# Patient Record
Sex: Female | Born: 1980 | Hispanic: Yes | Marital: Married | Smoking: Never smoker
Health system: Southern US, Community
[De-identification: ages and names within clinical notes are randomized; demographics above are authoritative.]

## PROBLEM LIST (undated history)

## (undated) ENCOUNTER — Emergency Department (HOSPITAL_COMMUNITY): Admission: EM | Payer: No Typology Code available for payment source | Source: Home / Self Care

## (undated) DIAGNOSIS — R519 Headache, unspecified: Secondary | ICD-10-CM

## (undated) DIAGNOSIS — F419 Anxiety disorder, unspecified: Secondary | ICD-10-CM

## (undated) DIAGNOSIS — E669 Obesity, unspecified: Secondary | ICD-10-CM

## (undated) DIAGNOSIS — I10 Essential (primary) hypertension: Secondary | ICD-10-CM

## (undated) DIAGNOSIS — K219 Gastro-esophageal reflux disease without esophagitis: Secondary | ICD-10-CM

## (undated) DIAGNOSIS — M069 Rheumatoid arthritis, unspecified: Secondary | ICD-10-CM

## (undated) DIAGNOSIS — L509 Urticaria, unspecified: Secondary | ICD-10-CM

## (undated) DIAGNOSIS — G8929 Other chronic pain: Secondary | ICD-10-CM

## (undated) DIAGNOSIS — R51 Headache: Secondary | ICD-10-CM

## (undated) HISTORY — DX: Headache: R51

## (undated) HISTORY — DX: Other chronic pain: G89.29

## (undated) HISTORY — DX: Headache, unspecified: R51.9

## (undated) HISTORY — DX: Anxiety disorder, unspecified: F41.9

## (undated) HISTORY — DX: Rheumatoid arthritis, unspecified: M06.9

## (undated) HISTORY — DX: Urticaria, unspecified: L50.9

## (undated) HISTORY — DX: Gastro-esophageal reflux disease without esophagitis: K21.9

## (undated) HISTORY — PX: NO PAST SURGERIES: SHX2092

---

## 2006-03-07 ENCOUNTER — Inpatient Hospital Stay (HOSPITAL_COMMUNITY): Admission: AD | Admit: 2006-03-07 | Discharge: 2006-03-07 | Payer: Self-pay | Admitting: Gynecology

## 2006-04-01 ENCOUNTER — Ambulatory Visit (HOSPITAL_COMMUNITY): Admission: RE | Admit: 2006-04-01 | Discharge: 2006-04-01 | Payer: Self-pay | Admitting: Gynecology

## 2006-05-20 ENCOUNTER — Inpatient Hospital Stay (HOSPITAL_COMMUNITY): Admission: AD | Admit: 2006-05-20 | Discharge: 2006-05-22 | Payer: Self-pay | Admitting: Obstetrics & Gynecology

## 2006-05-20 ENCOUNTER — Ambulatory Visit: Payer: Self-pay | Admitting: Obstetrics and Gynecology

## 2006-10-10 ENCOUNTER — Emergency Department (HOSPITAL_COMMUNITY): Admission: EM | Admit: 2006-10-10 | Discharge: 2006-10-11 | Payer: Self-pay | Admitting: Emergency Medicine

## 2009-03-22 ENCOUNTER — Emergency Department (HOSPITAL_COMMUNITY): Admission: EM | Admit: 2009-03-22 | Discharge: 2009-03-22 | Payer: Self-pay | Admitting: Emergency Medicine

## 2009-07-15 ENCOUNTER — Emergency Department (HOSPITAL_COMMUNITY): Admission: EM | Admit: 2009-07-15 | Discharge: 2009-07-15 | Payer: Self-pay | Admitting: Emergency Medicine

## 2010-02-13 ENCOUNTER — Emergency Department (HOSPITAL_COMMUNITY): Admission: EM | Admit: 2010-02-13 | Discharge: 2010-02-13 | Payer: Self-pay | Admitting: Emergency Medicine

## 2010-08-20 ENCOUNTER — Emergency Department (HOSPITAL_COMMUNITY): Admission: EM | Admit: 2010-08-20 | Discharge: 2010-06-23 | Payer: Self-pay | Admitting: Emergency Medicine

## 2010-11-30 LAB — URINALYSIS, ROUTINE W REFLEX MICROSCOPIC
Bilirubin Urine: NEGATIVE
Hgb urine dipstick: NEGATIVE
Protein, ur: NEGATIVE mg/dL
Urobilinogen, UA: 0.2 mg/dL (ref 0.0–1.0)

## 2010-11-30 LAB — WET PREP, GENITAL: Trich, Wet Prep: NONE SEEN

## 2010-11-30 LAB — RPR: RPR Ser Ql: NONREACTIVE

## 2010-12-20 LAB — POCT PREGNANCY, URINE: Preg Test, Ur: NEGATIVE

## 2010-12-20 LAB — DIFFERENTIAL
Eosinophils Absolute: 0.1 10*3/uL (ref 0.0–0.7)
Eosinophils Relative: 2 % (ref 0–5)
Lymphs Abs: 2.2 10*3/uL (ref 0.7–4.0)

## 2010-12-20 LAB — URINE MICROSCOPIC-ADD ON

## 2010-12-20 LAB — BASIC METABOLIC PANEL
BUN: 9 mg/dL (ref 6–23)
Chloride: 107 mEq/L (ref 96–112)
Glucose, Bld: 88 mg/dL (ref 70–99)
Potassium: 4.1 mEq/L (ref 3.5–5.1)

## 2010-12-20 LAB — URINALYSIS, ROUTINE W REFLEX MICROSCOPIC
Ketones, ur: NEGATIVE mg/dL
Leukocytes, UA: NEGATIVE
Nitrite: NEGATIVE
Protein, ur: NEGATIVE mg/dL
Urobilinogen, UA: 1 mg/dL (ref 0.0–1.0)

## 2010-12-20 LAB — CBC
HCT: 38.7 % (ref 36.0–46.0)
MCV: 87.9 fL (ref 78.0–100.0)
Platelets: 270 10*3/uL (ref 150–400)
WBC: 8.8 10*3/uL (ref 4.0–10.5)

## 2011-01-20 ENCOUNTER — Emergency Department (HOSPITAL_COMMUNITY): Payer: Self-pay

## 2011-01-20 ENCOUNTER — Emergency Department (HOSPITAL_COMMUNITY)
Admission: EM | Admit: 2011-01-20 | Discharge: 2011-01-20 | Disposition: A | Discharge status: Home / Self Care | Payer: Self-pay | Attending: Emergency Medicine | Admitting: Emergency Medicine

## 2011-01-20 DIAGNOSIS — R29898 Other symptoms and signs involving the musculoskeletal system: Secondary | ICD-10-CM | POA: Insufficient documentation

## 2011-01-20 DIAGNOSIS — M25519 Pain in unspecified shoulder: Secondary | ICD-10-CM | POA: Insufficient documentation

## 2011-04-26 ENCOUNTER — Emergency Department (HOSPITAL_COMMUNITY)
Admission: EM | Admit: 2011-04-26 | Discharge: 2011-04-27 | Disposition: A | Discharge status: Home / Self Care | Payer: Self-pay | Attending: Emergency Medicine | Admitting: Emergency Medicine

## 2011-04-26 DIAGNOSIS — R000 Tachycardia, unspecified: Secondary | ICD-10-CM | POA: Insufficient documentation

## 2011-04-26 DIAGNOSIS — L0291 Cutaneous abscess, unspecified: Secondary | ICD-10-CM | POA: Insufficient documentation

## 2011-04-26 DIAGNOSIS — L0390 Cellulitis, unspecified: Secondary | ICD-10-CM | POA: Insufficient documentation

## 2011-04-26 DIAGNOSIS — R509 Fever, unspecified: Secondary | ICD-10-CM | POA: Insufficient documentation

## 2011-04-26 DIAGNOSIS — M79609 Pain in unspecified limb: Secondary | ICD-10-CM | POA: Insufficient documentation

## 2011-04-27 ENCOUNTER — Emergency Department (HOSPITAL_COMMUNITY): Payer: Self-pay

## 2011-04-27 LAB — BASIC METABOLIC PANEL
CO2: 25 mEq/L (ref 19–32)
Calcium: 9.6 mg/dL (ref 8.4–10.5)
Creatinine, Ser: 0.72 mg/dL (ref 0.50–1.10)
GFR calc Af Amer: >60 mL/min (ref >60)
GFR calc non Af Amer: >60 mL/min (ref >60)
Sodium: 135 mEq/L (ref 135–145)

## 2011-04-27 LAB — DIFFERENTIAL
Basophils Absolute: 0 10*3/uL (ref 0.0–0.1)
Basophils Relative: 0 % (ref 0–1)
Neutro Abs: 6.3 10*3/uL (ref 1.7–7.7)
Neutrophils Relative %: 65 % (ref 43–77)

## 2011-04-27 LAB — CBC
MCH: 28 pg (ref 26.0–34.0)
MCHC: 32.6 g/dL (ref 30.0–36.0)
MCV: 86 fL (ref 78.0–100.0)
Platelets: 338 10*3/uL (ref 150–400)
RDW: 14.7 % (ref 11.5–15.5)

## 2011-04-27 LAB — URINALYSIS, ROUTINE W REFLEX MICROSCOPIC
Bilirubin Urine: NEGATIVE
Specific Gravity, Urine: 1.017 (ref 1.005–1.030)
Urobilinogen, UA: 1 mg/dL (ref 0.0–1.0)
pH: 6 (ref 5.0–8.0)

## 2011-04-27 LAB — URINE MICROSCOPIC-ADD ON

## 2011-04-28 LAB — URINE CULTURE

## 2011-09-02 ENCOUNTER — Encounter: Payer: Self-pay | Admitting: *Deleted

## 2011-09-02 ENCOUNTER — Other Ambulatory Visit: Payer: Self-pay

## 2011-09-02 ENCOUNTER — Emergency Department (HOSPITAL_COMMUNITY): Payer: Self-pay

## 2011-09-02 ENCOUNTER — Emergency Department (HOSPITAL_COMMUNITY)
Admission: EM | Admit: 2011-09-02 | Discharge: 2011-09-02 | Disposition: A | Discharge status: Home / Self Care | Payer: Self-pay | Attending: Emergency Medicine | Admitting: Emergency Medicine

## 2011-09-02 DIAGNOSIS — M79609 Pain in unspecified limb: Secondary | ICD-10-CM | POA: Insufficient documentation

## 2011-09-02 DIAGNOSIS — R55 Syncope and collapse: Secondary | ICD-10-CM | POA: Insufficient documentation

## 2011-09-02 DIAGNOSIS — J329 Chronic sinusitis, unspecified: Secondary | ICD-10-CM | POA: Insufficient documentation

## 2011-09-02 DIAGNOSIS — M722 Plantar fascial fibromatosis: Secondary | ICD-10-CM | POA: Insufficient documentation

## 2011-09-02 LAB — URINALYSIS, ROUTINE W REFLEX MICROSCOPIC
Bilirubin Urine: NEGATIVE
Ketones, ur: NEGATIVE mg/dL
Leukocytes, UA: NEGATIVE
Nitrite: NEGATIVE
Specific Gravity, Urine: 1.022 (ref 1.005–1.030)
Urobilinogen, UA: 0.2 mg/dL (ref 0.0–1.0)

## 2011-09-02 LAB — CBC
HCT: 36.2 % (ref 36.0–46.0)
Hemoglobin: 11.9 g/dL — ABNORMAL LOW (ref 12.0–15.0)
MCH: 27.4 pg (ref 26.0–34.0)
RBC: 4.35 MIL/uL (ref 3.87–5.11)

## 2011-09-02 LAB — COMPREHENSIVE METABOLIC PANEL
ALT: 14 U/L (ref 0–35)
Alkaline Phosphatase: 68 U/L (ref 39–117)
BUN: 11 mg/dL (ref 6–23)
CO2: 24 mEq/L (ref 19–32)
Calcium: 10.1 mg/dL (ref 8.4–10.5)
GFR calc Af Amer: >90 mL/min (ref >90)
GFR calc non Af Amer: >90 mL/min (ref >90)
Glucose, Bld: 85 mg/dL (ref 70–99)
Sodium: 138 mEq/L (ref 135–145)

## 2011-09-02 MED ORDER — HYDROCODONE-ACETAMINOPHEN 5-325 MG PO TABS: 1.0000 | ORAL_TABLET | Freq: Four times a day (QID) | ORAL | Status: AC | PRN

## 2011-09-02 MED ORDER — NAPROXEN 500 MG PO TABS: 500.0000 mg | ORAL_TABLET | Freq: Two times a day (BID) | ORAL | Status: DC

## 2011-09-02 MED ORDER — AZITHROMYCIN 250 MG PO TABS: 250.0000 mg | ORAL_TABLET | Freq: Every day | ORAL | Status: AC

## 2011-09-02 NOTE — ED Notes (Signed)
Pt states she is having pain in her feet. Pt states pain started a month ago. Pt states she is having difficulty ambulating and noticed a change in her feet color

## 2011-09-02 NOTE — ED Provider Notes (Addendum)
History     CSN: [id number]  Arrival date & time 09/02/11  1453   First MD Initiated Contact with Patient 09/02/11 1709      Chief Complaint  Patient presents with  . Foot Pain   Patient is a 30 year old female with several week history of bilateral foot pain is on the soles of feet worse in the morning pain is moderate currently 4/10 no history of injury. Patient also concerned about the color of her toenails.   (Consider location/radiation/quality/duration/timing/severity/associated sxs/prior treatment) The history is provided by the patient.    History reviewed. No pertinent past medical history.  History reviewed. No pertinent past surgical history.  No family history on file.  History  Substance Use Topics  . Smoking status: Never Smoker   . Smokeless tobacco: Not on file  . Alcohol Use: No    OB History    Grav Para Term Preterm Abortions TAB SAB Ect Mult Living                  Review of Systems  Constitutional: Negative for fever and chills.  HENT: Negative for congestion and neck pain.   Eyes: Negative for visual disturbance.  Respiratory: Negative for cough and shortness of breath.   Cardiovascular: Negative for chest pain.  Gastrointestinal: Negative for nausea, vomiting, abdominal pain and diarrhea.  Genitourinary: Negative for dysuria.  Musculoskeletal: Negative for back pain.  Neurological: Negative for headaches.  Hematological: Does not bruise/bleed easily.    Allergies  Review of patient's allergies indicates no known allergies.  Home Medications   Current Outpatient Rx  Name Route Sig Dispense Refill  . IBUPROFEN 200 MG PO TABS Oral Take 200 mg by mouth every 6 (six) hours as needed. pain     . AZITHROMYCIN 250 MG PO TABS Oral Take 1 tablet (250 mg total) by mouth daily. Take first 2 tablets together, then 1 every day until finished. 6 tablet 0  . HYDROCODONE-ACETAMINOPHEN 5-325 MG PO TABS Oral Take 1-2 tablets by mouth every 6 (six)  hours as needed for pain. 12 tablet 0  . NAPROXEN 500 MG PO TABS Oral Take 1 tablet (500 mg total) by mouth 2 (two) times daily. 20 tablet 0    BP 131/80  Pulse 95  Temp(Src) 99.2 F (37.3 C) (Oral)  Resp 20  Ht 5\' 3"  (1.6 m)  Wt 170 lb (77.111 kg)  BMI 30.11 kg/m2  SpO2 100%  Physical Exam  Nursing note and vitals reviewed. Constitutional: She is oriented to person, place, and time. She appears well-developed and well-nourished.  HENT:  Head: Normocephalic and atraumatic.  Mouth/Throat: Oropharynx is clear and moist.  Eyes: Conjunctivae and EOM are normal. Pupils are equal, round, and reactive to light.  Neck: Normal range of motion. Neck supple.  Cardiovascular: Normal rate, regular rhythm and normal heart sounds.   No murmur heard. Pulmonary/Chest: Effort normal and breath sounds normal.  Abdominal: Soft. Bowel sounds are normal. There is no tenderness.  Musculoskeletal: Normal range of motion. She exhibits no edema and no tenderness.       Bilateral feet with no significant tenderness no significant swelling Refill is normal at 2 seconds her last strong dorsalis pedis pulse 2+ in each foot. No significant deformities to the feet. No ankle swelling.   Lymphadenopathy:    She has no cervical adenopathy.  Neurological: She is alert and oriented to person, place, and time. No cranial nerve deficit. She exhibits normal muscle tone. Coordination normal.  Skin: Skin is warm. No rash noted. No erythema.    ED Course  Procedures (including critical care time)  Labs Reviewed  CBC - Abnormal; Notable for the following:    Hemoglobin 11.9 (*)    Platelets 411 (*)    All other components within normal limits  COMPREHENSIVE METABOLIC PANEL - Abnormal; Notable for the following:    Total Protein 8.6 (*)    All other components within normal limits  URINALYSIS, ROUTINE W REFLEX MICROSCOPIC  PREGNANCY, URINE   Dg Chest 2 View  09/02/2011  *RADIOLOGY REPORT*  Clinical Data:  Syncope.  Language barrier.  CHEST - 2 VIEW  Comparison: 04/27/2011  Findings: Low lung volumes.  Increased perihilar markings suggest viral pneumonitis.  No lobar consolidation.  Normal heart size. Bones unremarkable.  Worsening aeration from priors.  IMPRESSION: Increased perihilar markings suggest viral pneumonitis.  No lobar consolidation.  Original Report Authenticated By: Elsie Stain, M.D.   Ct Head Wo Contrast  09/02/2011  *RADIOLOGY REPORT*  Clinical Data: Syncope.  CT HEAD WITHOUT CONTRAST  Technique:  Contiguous axial images were obtained from the base of the skull through the vertex without contrast.  Comparison: None.  Findings: There is no evidence for acute infarction, intracranial hemorrhage, mass lesion, hydrocephalus, or extra-axial fluid. There is no atrophy or white matter disease.  Calvarium is intact. Mastoid air cells are clear. The maxillary and ethmoid sinuses are clear.  There is asymmetric opacity in the right frontal sinus with scattered air-fluid levels implying acute disease. Posterior wall of the frontal sinus is intact.  IMPRESSION: No acute or focal intracranial abnormalities.  Acute right frontal sinusitis.  Original Report Authenticated By: Elsie Stain, M.D.   Results for orders placed during the hospital encounter of 09/02/11  CBC      Component Value Range   WBC 8.0  4.0 - 10.5 (K/uL)   RBC 4.35  3.87 - 5.11 (MIL/uL)   Hemoglobin 11.9 (*) 12.0 - 15.0 (g/dL)   HCT 64.4  03.4 - 74.2 (%)   MCV 83.2  78.0 - 100.0 (fL)   MCH 27.4  26.0 - 34.0 (pg)   MCHC 32.9  30.0 - 36.0 (g/dL)   RDW 59.5  63.8 - 75.6 (%)   Platelets 411 (*) 150 - 400 (K/uL)  COMPREHENSIVE METABOLIC PANEL      Component Value Range   Sodium 138  135 - 145 (mEq/L)   Potassium 3.8  3.5 - 5.1 (mEq/L)   Chloride 102  96 - 112 (mEq/L)   CO2 24  19 - 32 (mEq/L)   Glucose, Bld 85  70 - 99 (mg/dL)   BUN 11  6 - 23 (mg/dL)   Creatinine, Ser 4.33  0.50 - 1.10 (mg/dL)   Calcium 29.5  8.4 - 10.5  (mg/dL)   Total Protein 8.6 (*) 6.0 - 8.3 (g/dL)   Albumin 4.1  3.5 - 5.2 (g/dL)   AST 19  0 - 37 (U/L)   ALT 14  0 - 35 (U/L)   Alkaline Phosphatase 68  39 - 117 (U/L)   Total Bilirubin 0.4  0.3 - 1.2 (mg/dL)   GFR calc non Af Amer >90  >90 (mL/min)   GFR calc Af Amer >90  >90 (mL/min)  URINALYSIS, ROUTINE W REFLEX MICROSCOPIC      Component Value Range   Color, Urine YELLOW  YELLOW    APPearance CLEAR  CLEAR    Specific Gravity, Urine 1.022  1.005 - 1.030  pH 5.5  5.0 - 8.0    Glucose, UA NEGATIVE  NEGATIVE (mg/dL)   Hgb urine dipstick NEGATIVE  NEGATIVE    Bilirubin Urine NEGATIVE  NEGATIVE    Ketones, ur NEGATIVE  NEGATIVE (mg/dL)   Protein, ur NEGATIVE  NEGATIVE (mg/dL)   Urobilinogen, UA 0.2  0.0 - 1.0 (mg/dL)   Nitrite NEGATIVE  NEGATIVE    Leukocytes, UA NEGATIVE  NEGATIVE   PREGNANCY, URINE      Component Value Range   Preg Test, Ur NEGATIVE       1. Plantar fasciitis   2. Sinusitis   3. Near syncope       MDM   Clinically suspect of plantar fasciitis since involving both feet and is on the bottom of the feet on exam nothing strikingly abnormal blood flow and neurovascular exam is normal. No history of injury. Symptoms are present for several days to weeks. Recommend anti-inflammatory medicine referral given to [facility] for further treatment.   Addendum: At discharge patient using the interpreter phone for additional complaint of near syncope and dizziness at work for the past 3 days almost passing out on each day not related to the foot pain. A stone this proceeded with additional workup to evaluate for near syncope without significant findings except for the finding of sinusitis. Chest x-ray suggestive of a pneumonitis or a recent viral infection patient gave no history related to that. We'll treat for sinusitis with Zithromax. Followup with podiatry still recommended as stated above. Patient was given a work excuse so she can rest for the next 3 days.        Shelda Jakes, MD 09/02/11 1840  Shelda Jakes, MD 09/02/11 2159

## 2011-09-02 NOTE — ED Notes (Signed)
After talking with the interpreter the pt. Also has the c/o of passing out. Discharge cancelled and futher workup ordered.

## 2012-03-08 ENCOUNTER — Encounter (HOSPITAL_COMMUNITY): Payer: Self-pay | Admitting: *Deleted

## 2012-03-08 ENCOUNTER — Emergency Department (HOSPITAL_COMMUNITY)
Admission: EM | Admit: 2012-03-08 | Discharge: 2012-03-08 | Disposition: A | Discharge status: Home / Self Care | Payer: Self-pay | Attending: Emergency Medicine | Admitting: Emergency Medicine

## 2012-03-08 ENCOUNTER — Emergency Department (HOSPITAL_COMMUNITY): Payer: Self-pay

## 2012-03-08 DIAGNOSIS — R059 Cough, unspecified: Secondary | ICD-10-CM | POA: Insufficient documentation

## 2012-03-08 DIAGNOSIS — R209 Unspecified disturbances of skin sensation: Secondary | ICD-10-CM | POA: Insufficient documentation

## 2012-03-08 DIAGNOSIS — R05 Cough: Secondary | ICD-10-CM | POA: Insufficient documentation

## 2012-03-08 DIAGNOSIS — R079 Chest pain, unspecified: Secondary | ICD-10-CM | POA: Insufficient documentation

## 2012-03-08 DIAGNOSIS — R531 Weakness: Secondary | ICD-10-CM

## 2012-03-08 DIAGNOSIS — R0602 Shortness of breath: Secondary | ICD-10-CM | POA: Insufficient documentation

## 2012-03-08 DIAGNOSIS — R5381 Other malaise: Secondary | ICD-10-CM | POA: Insufficient documentation

## 2012-03-08 LAB — COMPREHENSIVE METABOLIC PANEL
ALT: 8 U/L (ref 0–35)
AST: 13 U/L (ref 0–37)
Albumin: 4.1 g/dL (ref 3.5–5.2)
Calcium: 9.5 mg/dL (ref 8.4–10.5)
GFR calc Af Amer: >90 mL/min (ref >90)
Glucose, Bld: 100 mg/dL — ABNORMAL HIGH (ref 70–99)
Sodium: 139 mEq/L (ref 135–145)
Total Protein: 8.1 g/dL (ref 6.0–8.3)

## 2012-03-08 LAB — CBC WITH DIFFERENTIAL/PLATELET
Basophils Absolute: 0 10*3/uL (ref 0.0–0.1)
Basophils Relative: 0 % (ref 0–1)
Eosinophils Absolute: 0.1 10*3/uL (ref 0.0–0.7)
Eosinophils Relative: 1 % (ref 0–5)
MCH: 28.2 pg (ref 26.0–34.0)
MCHC: 33.2 g/dL (ref 30.0–36.0)
MCV: 84.9 fL (ref 78.0–100.0)
Platelets: 337 10*3/uL (ref 150–400)
RDW: 14.9 % (ref 11.5–15.5)

## 2012-03-08 LAB — URINALYSIS, ROUTINE W REFLEX MICROSCOPIC
Bilirubin Urine: NEGATIVE
Nitrite: NEGATIVE
Specific Gravity, Urine: 1.015 (ref 1.005–1.030)
pH: 5.5 (ref 5.0–8.0)

## 2012-03-08 LAB — D-DIMER, QUANTITATIVE: D-Dimer, Quant: 1.96 ug/mL-FEU — ABNORMAL HIGH (ref 0.00–0.48)

## 2012-03-08 MED ORDER — IOHEXOL 350 MG/ML SOLN
80.0000 mL | Freq: Once | INTRAVENOUS | Status: AC | PRN
  Administered 2012-03-08: 80 mL via INTRAVENOUS

## 2012-03-08 MED ORDER — SODIUM CHLORIDE 0.9 % IV BOLUS (SEPSIS)
1000.0000 mL | Freq: Once | INTRAVENOUS | Status: AC
  Administered 2012-03-08: 1000 mL via INTRAVENOUS

## 2012-03-08 NOTE — ED Notes (Signed)
Pt working in a kitchen, became overheated and c/o "feeling cold inside", tingling in extremeties. Hyperventilating and anxious in triage. Pt spanish-speaking.

## 2012-03-08 NOTE — ED Notes (Signed)
Old and new EKG given to Dr. Rosalia Hammers.

## 2012-03-08 NOTE — ED Notes (Signed)
NO prescriptions provided at discharge.

## 2012-03-08 NOTE — ED Notes (Signed)
Pharmacy interpreter, pt feels dizzy w/o ringing in ears or spots in eyes, states the room is spinning. Feels like something is coming up in her abdomen but she denies nausea, emesis or being pregnant. No shortness of breath, chest pain, VS WNL.

## 2012-03-08 NOTE — ED Notes (Signed)
[id number]<AV> Expected date:<BR> Expected time:<BR> Means of arrival:<BR> Comments:<BR> ems- got over heated at work

## 2012-03-08 NOTE — Discharge Instructions (Signed)
Pre-sncope (Near-Syncope)  La lipotimia es una sensacin de debilidad o mareos repentinos, o la sensacin de desmayo inminente. Puede ocurrir al levantarse o cuando se permanece de pie durante mucho tiempo. La causa puede ser un descenso de la presin arterial. Es una reaccin frecuente, pero puede ocurrir ms fcilmente a aquellos que toman medicamentos para Scientist, physiological presin arterial. Los mareos normalmente ocurren cuando la presin sangunea o el pulso es muy bajo para proporcionar un flujo sanguneo suficiente al cerebro y Pharmacologist la conciencia. El desmayo y el pre-sncope no suelen deberse a problemas serios. Sin embargo, Runner, broadcasting/film/video deben tener ms cuidado, como los Warner, los pacientes que sufren diabetes y los pacientes con historia de enfermedades cardacas (especialmente los que tienen un ritmo cardaco irregular. CAUSAS  Disminucin de la presin arterial.   Dolores fsicos.   Deshidratacin.   Agotamiento por calor.   Estrs emocional.   Bajo nivel de azcar en la sangre.   Hemorragias internas.   Problemas cardacos y circulatorios.   Infecciones  SNTOMAS  Mareos.   Nuseas.   Lipotimia   Adormecimiento.   Palidez.   Visin en tnel.   Debilitamiento.  INSTRUCCIONES PARA EL CUIDADO DOMICILIARIO  Si siente que va a desmayarse, recustese en seguida. Respire profundamente y de [place]. Espere hasta que los sntomas hayan desaparecido. La mayor parte de los episodios dura unos pocos minutos. Podr sentirse cansado por varias horas.   Beba gran cantidad de lquido para mantener la orina de tono claro o color amarillo plido.   Si est tomando medicamentos para la presin o el corazn, Interior and spatial designer, tomarse varios minutos para sentarse y luego pararse puede reducir los sntomas del mareo debido a una baja de presin sangunea.  SOLICITE ATENCIN MDICA DE INMEDIATO SI:  Dolor de cabeza intenso.   Dolor intenso en el pecho, el  abdomen, o la espalda.   Tiene sangrado por la boca o el ano o la materia fecal.es de color negro o alquitranado.   Ritmo cardaco irregular o pulso rpido.   Mareos repetidos o temblores como sacudidas durante un episodio.   Desmayo mientras se encuentra sentado o acostado.   Confusin.   Dificultad para caminar.   Debilitamiento grave.   Problemas de la visin.  ASEGRESE QUE:  Comprende estas instrucciones.   Controlar su enfermedad.   Solicitar ayuda de inmediato si no mejora o empeora.  Document Released: 08/30/2005 Document Revised: 08/19/2011 [facility] Patient Information 2012 [place], [place].

## 2012-03-08 NOTE — ED Provider Notes (Signed)
History     CSN: [id number]  Arrival date & time 03/08/12  1203   First MD Initiated Contact with Patient 03/08/12 1244      Chief Complaint  Patient presents with  . 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  Patient denies similar symptoms in past, no history of anxiety.  Patient states some nausea with one episode of vomiting at same time.  Some mild chest tingling.  No nausea now.  Chills, small cough, dyspnea, cough nonproductive,no abdominal pain, no swelling or pain in legs, no abnormal vaginal discharge, no uti symptoms.Patient had one soda today but did not eat breakfast or lunch.   LMP  June 7, nml, no bcp.  G2p2, denies sexually activity for 4-5 years.  Patient continues to feel generally weak and nauseated and hot.   History reviewed. No pertinent past medical history.  History reviewed. No pertinent past surgical history.  No family history on file.  History  Substance Use Topics  . Smoking status: Never Smoker   . Smokeless tobacco: Not on file  . Alcohol Use: No    OB History    Grav Para Term Preterm Abortions TAB SAB Ect Mult Living                  Review of Systems  Respiratory: Positive for cough and shortness of breath.   Cardiovascular: Positive for chest pain.  Genitourinary: Negative.   Musculoskeletal: Negative.   Neurological: Negative.   Hematological: Negative.   All other systems reviewed and are negative.    Allergies  Review of patient's allergies indicates no known allergies.  Home Medications   Current Outpatient Rx  Name Route Sig Dispense Refill  . IBUPROFEN 200 MG PO TABS Oral Take 200 mg by mouth every 6 (six) hours as needed. pain      BP 138/72  Pulse 83  Temp 98.4 F (36.9 C)  Resp 20  SpO2 99%  Physical Exam  Nursing note and vitals  reviewed. Constitutional: She appears well-developed and well-nourished.  HENT:  Head: Normocephalic and atraumatic.  Eyes: Conjunctivae and EOM are normal. Pupils are equal, round, and reactive to light.  Neck: Normal range of motion. Neck supple.  Cardiovascular: Normal rate, regular rhythm, normal heart sounds and intact distal pulses.   Pulmonary/Chest: Effort normal and breath sounds normal.  Abdominal: Soft. Bowel sounds are normal.  Musculoskeletal: Normal range of motion.  Neurological: She is alert.  Skin: Skin is warm and dry.  Psychiatric: She has a normal mood and affect. Thought content normal.    ED Course  Procedures (including critical care time)  Labs Reviewed - No data to display No results found.   No diagnosis found.   Date: 03/08/2012  Rate: 72  Rhythm: normal sinus rhythm  QRS Axis: normal  Intervals: normal  ST/T Wave abnormalities: normal  Conduction Disutrbances:rsr v1 and v2  Narrative Interpretation:   Old EKG Reviewed: unchanged  Results for orders placed during the hospital encounter of 03/08/12  CBC WITH DIFFERENTIAL      Component Value Range   WBC 11.0 (*) 4.0 - 10.5 K/uL   RBC 4.44  3.87 - 5.11 MIL/uL   Hemoglobin 12.5  12.0 - 15.0 g/dL   HCT 29.5  28.4 - 13.2 %   MCV 84.9  78.0 - 100.0 fL   MCH 28.2  26.0 -  34.0 pg   MCHC 33.2  30.0 - 36.0 g/dL   RDW 16.1  09.6 - 04.5 %   Platelets 337  150 - 400 K/uL   Neutrophils Relative 84 (*) 43 - 77 %   Neutro Abs 9.2 (*) 1.7 - 7.7 K/uL   Lymphocytes Relative 11 (*) 12 - 46 %   Lymphs Abs 1.2  0.7 - 4.0 K/uL   Monocytes Relative 4  3 - 12 %   Monocytes Absolute 0.4  0.1 - 1.0 K/uL   Eosinophils Relative 1  0 - 5 %   Eosinophils Absolute 0.1  0.0 - 0.7 K/uL   Basophils Relative 0  0 - 1 %   Basophils Absolute 0.0  0.0 - 0.1 K/uL  COMPREHENSIVE METABOLIC PANEL      Component Value Range   Sodium 139  135 - 145 mEq/L   Potassium 3.9  3.5 - 5.1 mEq/L   Chloride 106  96 - 112 mEq/L   CO2  22  19 - 32 mEq/L   Glucose, Bld 100 (*) 70 - 99 mg/dL   BUN 11  6 - 23 mg/dL   Creatinine, Ser 4.09  0.50 - 1.10 mg/dL   Calcium 9.5  8.4 - 81.1 mg/dL   Total Protein 8.1  6.0 - 8.3 g/dL   Albumin 4.1  3.5 - 5.2 g/dL   AST 13  0 - 37 U/L   ALT 8  0 - 35 U/L   Alkaline Phosphatase 68  39 - 117 U/L   Total Bilirubin 0.6  0.3 - 1.2 mg/dL   GFR calc non Af Amer >90  >90 mL/min   GFR calc Af Amer >90  >90 mL/min  URINALYSIS, ROUTINE W REFLEX MICROSCOPIC      Component Value Range   Color, Urine YELLOW  YELLOW   APPearance CLEAR  CLEAR   Specific Gravity, Urine 1.015  1.005 - 1.030   pH 5.5  5.0 - 8.0   Glucose, UA NEGATIVE  NEGATIVE mg/dL   Hgb urine dipstick NEGATIVE  NEGATIVE   Bilirubin Urine NEGATIVE  NEGATIVE   Ketones, ur NEGATIVE  NEGATIVE mg/dL   Protein, ur NEGATIVE  NEGATIVE mg/dL   Urobilinogen, UA 0.2  0.0 - 1.0 mg/dL   Nitrite NEGATIVE  NEGATIVE   Leukocytes, UA NEGATIVE  NEGATIVE  PREGNANCY, URINE      Component Value Range   Preg Test, Ur NEGATIVE  NEGATIVE  D-DIMER, QUANTITATIVE      Component Value Range   D-Dimer, Quant 1.96 (*) 0.00 - 0.48 ug/mL-FEU     MDM    Labs normal except elevated d-dimer.  Ct angio normal.  Patient with improved symptoms possibly due to mild heat exhaustion and/or volume depletion.  Patient advised to improve oral hydration and follow up with pmd.      Hilario Quarry, MD 03/08/12 [phone]

## 2012-11-01 ENCOUNTER — Emergency Department (HOSPITAL_COMMUNITY)
Admission: EM | Admit: 2012-11-01 | Discharge: 2012-11-01 | Disposition: A | Discharge status: Home / Self Care | Payer: Self-pay | Attending: Emergency Medicine | Admitting: Emergency Medicine

## 2012-11-01 ENCOUNTER — Emergency Department (HOSPITAL_COMMUNITY): Payer: Self-pay

## 2012-11-01 ENCOUNTER — Encounter (HOSPITAL_COMMUNITY): Payer: Self-pay | Admitting: Emergency Medicine

## 2012-11-01 DIAGNOSIS — R1013 Epigastric pain: Secondary | ICD-10-CM | POA: Insufficient documentation

## 2012-11-01 DIAGNOSIS — K219 Gastro-esophageal reflux disease without esophagitis: Secondary | ICD-10-CM | POA: Insufficient documentation

## 2012-11-01 LAB — BASIC METABOLIC PANEL
CO2: 24 mEq/L (ref 19–32)
Calcium: 9.3 mg/dL (ref 8.4–10.5)
Creatinine, Ser: 0.68 mg/dL (ref 0.50–1.10)
Glucose, Bld: 84 mg/dL (ref 70–99)
Sodium: 139 mEq/L (ref 135–145)

## 2012-11-01 LAB — CBC
HCT: 38.5 % (ref 36.0–46.0)
Hemoglobin: 12.4 g/dL (ref 12.0–15.0)
MCH: 27.6 pg (ref 26.0–34.0)
MCV: 85.7 fL (ref 78.0–100.0)
RBC: 4.49 MIL/uL (ref 3.87–5.11)

## 2012-11-01 LAB — TROPONIN I: Troponin I: <0.3 ng/mL (ref <0.30)

## 2012-11-01 MED ORDER — SUCRALFATE 1 G PO TABS: 1.0000 g | ORAL_TABLET | Freq: Four times a day (QID) | ORAL | Status: DC

## 2012-11-01 MED ORDER — FAMOTIDINE 20 MG PO TABS
40.0000 mg | ORAL_TABLET | Freq: Once | ORAL | Status: AC
  Administered 2012-11-01: 40 mg via ORAL
  Filled 2012-11-01: qty 2

## 2012-11-01 MED ORDER — SODIUM CHLORIDE 0.9 % IV SOLN: INTRAVENOUS | Status: DC

## 2012-11-01 MED ORDER — PANTOPRAZOLE SODIUM 20 MG PO TBEC: 20.0000 mg | DELAYED_RELEASE_TABLET | Freq: Every day | ORAL | Status: DC

## 2012-11-01 MED ORDER — GI COCKTAIL ~~LOC~~
30.0000 mL | Freq: Once | ORAL | Status: AC
  Administered 2012-11-01: 30 mL via ORAL
  Filled 2012-11-01: qty 30

## 2012-11-01 NOTE — ED Notes (Signed)
US at bedside

## 2012-11-01 NOTE — ED Provider Notes (Signed)
History     CSN: [id number]  Arrival date & time 11/01/12  1518   First MD Initiated Contact with Patient 11/01/12 1606      Chief Complaint  Patient presents with  . Chest Pain    (Consider location/radiation/quality/duration/timing/severity/associated sxs/prior treatment) Patient is a 32 y.o. female presenting with chest pain. The history is provided by the patient.  Chest Pain  patient here complaining of epigastric pain that is worse with eating. She has vomiting approximately 15 minutes after eating. Symptoms have been ongoing and she was placed on antibiotics medications without relief. Denies any anginal type chest pain. Pain is burning and does radiate to her back. No urinary sx or vaginal bleeding  History reviewed. No pertinent past medical history.  History reviewed. No pertinent past surgical history.  No family history on file.  History  Substance Use Topics  . Smoking status: Never Smoker   . Smokeless tobacco: Not on file  . Alcohol Use: No    OB History   Grav Para Term Preterm Abortions TAB SAB Ect Mult Living                  Review of Systems  Cardiovascular: Positive for chest pain.  All other systems reviewed and are negative.    Allergies  Review of patient's allergies indicates no known allergies.  Home Medications  No current outpatient prescriptions on file.  BP 127/71  Pulse 82  Temp(Src) 98.2 F (36.8 C) (Oral)  Resp 18  SpO2 100%  LMP 10/25/2012  Physical Exam  Nursing note and vitals reviewed. Constitutional: She is oriented to person, place, and time. She appears well-developed and well-nourished.  Non-toxic appearance. No distress.  HENT:  Head: Normocephalic and atraumatic.  Eyes: Conjunctivae, EOM and lids are normal. Pupils are equal, round, and reactive to light.  Neck: Normal range of motion. Neck supple. No tracheal deviation present. No mass present.  Cardiovascular: Normal rate, regular rhythm and normal heart  sounds.  Exam reveals no gallop.   No murmur heard. Pulmonary/Chest: Effort normal and breath sounds normal. No stridor. No respiratory distress. She has no decreased breath sounds. She has no wheezes. She has no rhonchi. She has no rales.  Abdominal: Soft. Normal appearance and bowel sounds are normal. She exhibits no distension. There is tenderness in the epigastric area. There is no rebound and no CVA tenderness.  Musculoskeletal: Normal range of motion. She exhibits no edema and no tenderness.  Neurological: She is alert and oriented to person, place, and time. She has normal strength. No cranial nerve deficit or sensory deficit. GCS eye subscore is 4. GCS verbal subscore is 5. GCS motor subscore is 6.  Skin: Skin is warm and dry. No abrasion and no rash noted.  Psychiatric: She has a normal mood and affect. Her speech is normal and behavior is normal.    ED Course  Procedures (including critical care time)  Labs Reviewed  CBC  BASIC METABOLIC PANEL  PRO B NATRIURETIC PEPTIDE  TROPONIN I  LIPASE, BLOOD   No results found.   No diagnosis found.    MDM   Patient with symptoms consistent with GERD. No evidence of gallstones on ultrasound. She has no signs of pancreatitis at this time. She had been on some type of antacid in the past. I will place her on a PPI and give her Carafate and referral to GI on call       Toy Baker, MD 11/01/12 1752

## 2012-11-01 NOTE — ED Notes (Signed)
MD at bedside. 

## 2012-11-01 NOTE — ED Notes (Signed)
Pt presenting to ed with c/o chest pain with positive shortness of breath, nausea and vomiting. Pt states she is also having tingling in her left arm onset yesterday

## 2013-03-08 ENCOUNTER — Encounter (HOSPITAL_COMMUNITY): Payer: Self-pay | Admitting: *Deleted

## 2013-03-08 ENCOUNTER — Emergency Department (HOSPITAL_COMMUNITY): Payer: Worker's Compensation

## 2013-03-08 ENCOUNTER — Emergency Department (HOSPITAL_COMMUNITY)
Admission: EM | Admit: 2013-03-08 | Discharge: 2013-03-08 | Disposition: A | Discharge status: Home / Self Care | Payer: Worker's Compensation | Attending: Emergency Medicine | Admitting: Emergency Medicine

## 2013-03-08 DIAGNOSIS — M79609 Pain in unspecified limb: Secondary | ICD-10-CM | POA: Insufficient documentation

## 2013-03-08 DIAGNOSIS — R1110 Vomiting, unspecified: Secondary | ICD-10-CM | POA: Insufficient documentation

## 2013-03-08 DIAGNOSIS — R0789 Other chest pain: Secondary | ICD-10-CM

## 2013-03-08 DIAGNOSIS — Z3202 Encounter for pregnancy test, result negative: Secondary | ICD-10-CM | POA: Insufficient documentation

## 2013-03-08 DIAGNOSIS — R0602 Shortness of breath: Secondary | ICD-10-CM | POA: Insufficient documentation

## 2013-03-08 DIAGNOSIS — R071 Chest pain on breathing: Secondary | ICD-10-CM | POA: Insufficient documentation

## 2013-03-08 LAB — LIPASE, BLOOD: Lipase: 16 U/L (ref 11–59)

## 2013-03-08 LAB — URINALYSIS, ROUTINE W REFLEX MICROSCOPIC
Bilirubin Urine: NEGATIVE
Nitrite: NEGATIVE
Specific Gravity, Urine: 1.019 (ref 1.005–1.030)
pH: 5 (ref 5.0–8.0)

## 2013-03-08 LAB — CBC
Hemoglobin: 12.6 g/dL (ref 12.0–15.0)
MCH: 28.4 pg (ref 26.0–34.0)
RBC: 4.44 MIL/uL (ref 3.87–5.11)

## 2013-03-08 LAB — URINE MICROSCOPIC-ADD ON

## 2013-03-08 NOTE — ED Notes (Signed)
Pt has multiple complaints. Reports mid chest pain today, feels difficult to take a deep breath, having chills, bodyaches and vomiting. Denies diarrhea. ekg being done at triage.

## 2013-03-08 NOTE — ED Provider Notes (Signed)
History    CSN: [id number] Arrival date & time 03/08/13  1334  First MD Initiated Contact with Patient 03/08/13 1351     Chief Complaint  Patient presents with  . Chest Pain  . Shortness of Breath  . Emesis   (Consider location/radiation/quality/duration/timing/severity/associated sxs/prior Treatment) HPI  Patient presents today complaining of left sided chest pain that is sharp in nature. It began when she was at work today. She has some radiation through to her back. She has some shortness of breath associated with this. She has had this multiple times in the past and has been assessed and states that she was told it was "nothing". She has not had any cough. She has not re\re of DVT or pulmonary embolism. She denies any fever, headache, nausea, vomiting, or diarrhea. She has been eating normally. She was at work today she has to stand and began having these symptoms approximately an hour prior to admission. The chest pain in the chest is sharp it is not currently having any radiation, it has been present for approximately one hour. It is moderate. She has some associated shortness of breath. She has not had any treatment for this. History reviewed. No pertinent past medical history. History reviewed. No pertinent past surgical history. History reviewed. No pertinent family history. History  Substance Use Topics  . Smoking status: Never Smoker   . Smokeless tobacco: Not on file  . Alcohol Use: No   OB History   Grav Para Term Preterm Abortions TAB SAB Ect Mult Living                 Review of Systems  All other systems reviewed and are negative.    Allergies  Review of patient's allergies indicates no known allergies.  Home Medications   Current Outpatient Rx  Name  Route  Sig  Dispense  Refill  . Famotidine (ACID CONTROLLER PO)   Oral   Take 1 tablet by mouth daily as needed (acid reflux).          BP 121/72  Pulse 70  Temp(Src) 99.1 F (37.3 C) (Oral)  Resp  16  SpO2 100%  LMP 02/11/2013 Physical Exam  Nursing note and vitals reviewed. Constitutional: She is oriented to person, place, and time. She appears well-developed and well-nourished.  HENT:  Head: Normocephalic and atraumatic.  Right Ear: External ear normal.  Left Ear: External ear normal.  Nose: Nose normal.  Mouth/Throat: Oropharynx is clear and moist.  Eyes: Conjunctivae and EOM are normal. Pupils are equal, round, and reactive to light.  Neck: Normal range of motion. Neck supple.  Cardiovascular: Normal rate, regular rhythm, normal heart sounds and intact distal pulses.   Pulmonary/Chest: Effort normal and breath sounds normal. She exhibits tenderness.  Abdominal: Soft. Bowel sounds are normal.  Musculoskeletal: Normal range of motion.  Mild bilateral calf ttp.  Neurological: She is alert and oriented to person, place, and time. She has normal reflexes.  Skin: Skin is warm and dry.  Psychiatric: She has a normal mood and affect. Her behavior is normal. Thought content normal.    ED Course  Procedures (including critical care time) Labs Reviewed  URINALYSIS, ROUTINE W REFLEX MICROSCOPIC - Abnormal; Notable for the following:    Leukocytes, UA TRACE (*)    All other components within normal limits  URINE MICROSCOPIC-ADD ON - Abnormal; Notable for the following:    Squamous Epithelial / LPF MANY (*)    Bacteria, UA FEW (*)  All other components within normal limits  CBC  LIPASE, BLOOD  PREGNANCY, URINE  POCT I-STAT TROPONIN I   Dg Chest 2 View  03/08/2013   *RADIOLOGY REPORT*  Clinical Data: Chest pain. Shortness of breath.  CHEST - 2 VIEW  Comparison: 03/08/2012.  Findings: Minimal peribronchial thickening stable and chronic and may represent reactive changes.  No infiltrate, congestive heart failure or pneumothorax.  Confluence of shadows anterior aspect right second rib.  Heart size within normal limits.  IMPRESSION: No acute abnormality.   Original Report  Authenticated By: Lacy Duverney, M.D.   No diagnosis found.    Date: 03/08/2013  Rate: 87  Rhythm: normal sinus rhythm  QRS Axis: normal  Intervals: normal  ST/T Wave abnormalities: normal  Conduction Disutrbances: none  Narrative Interpretation: unremarkable   Results for orders placed during the hospital encounter of 03/08/13  CBC      Result Value Range   WBC 9.5  4.0 - 10.5 K/uL   RBC 4.44  3.87 - 5.11 MIL/uL   Hemoglobin 12.6  12.0 - 15.0 g/dL   HCT 16.1  09.6 - 04.5 %   MCV 85.1  78.0 - 100.0 fL   MCH 28.4  26.0 - 34.0 pg   MCHC 33.3  30.0 - 36.0 g/dL   RDW 40.9  81.1 - 91.4 %   Platelets 321  150 - 400 K/uL  LIPASE, BLOOD      Result Value Range   Lipase 16  11 - 59 U/L  URINALYSIS, ROUTINE W REFLEX MICROSCOPIC      Result Value Range   Color, Urine YELLOW  YELLOW   APPearance CLEAR  CLEAR   Specific Gravity, Urine 1.019  1.005 - 1.030   pH 5.0  5.0 - 8.0   Glucose, UA NEGATIVE  NEGATIVE mg/dL   Hgb urine dipstick NEGATIVE  NEGATIVE   Bilirubin Urine NEGATIVE  NEGATIVE   Ketones, ur NEGATIVE  NEGATIVE mg/dL   Protein, ur NEGATIVE  NEGATIVE mg/dL   Urobilinogen, UA 0.2  0.0 - 1.0 mg/dL   Nitrite NEGATIVE  NEGATIVE   Leukocytes, UA TRACE (*) NEGATIVE  URINE MICROSCOPIC-ADD ON      Result Value Range   Squamous Epithelial / LPF MANY (*) RARE   WBC, UA 0-2  <3 WBC/hpf   Bacteria, UA FEW (*) RARE  PREGNANCY, URINE      Result Value Range   Preg Test, Ur NEGATIVE  NEGATIVE  POCT I-STAT TROPONIN I      Result Value Range   Troponin i, poc 0.02  0.00 - 0.08 ng/mL   Comment 3             Patient with normal work up here, with normal vital signs- specifically no tachypnea, tachycardia, normotensive.  She has a negative d-dimer although some bilateral lower extremity ttp.  It is unclear the etiology of her pain but likely musculoskeletal given chest ttp.   Hilario Quarry, MD 03/08/13 [phone]

## 2013-08-06 ENCOUNTER — Emergency Department (HOSPITAL_COMMUNITY): Payer: Self-pay

## 2013-08-06 ENCOUNTER — Encounter (HOSPITAL_COMMUNITY): Payer: Self-pay | Admitting: Emergency Medicine

## 2013-08-06 ENCOUNTER — Emergency Department (HOSPITAL_COMMUNITY)
Admission: EM | Admit: 2013-08-06 | Discharge: 2013-08-06 | Disposition: A | Discharge status: Home / Self Care | Payer: Self-pay | Attending: Emergency Medicine | Admitting: Emergency Medicine

## 2013-08-06 DIAGNOSIS — R1110 Vomiting, unspecified: Secondary | ICD-10-CM | POA: Insufficient documentation

## 2013-08-06 DIAGNOSIS — K219 Gastro-esophageal reflux disease without esophagitis: Secondary | ICD-10-CM | POA: Insufficient documentation

## 2013-08-06 DIAGNOSIS — F410 Panic disorder [episodic paroxysmal anxiety] without agoraphobia: Secondary | ICD-10-CM | POA: Insufficient documentation

## 2013-08-06 DIAGNOSIS — I10 Essential (primary) hypertension: Secondary | ICD-10-CM | POA: Insufficient documentation

## 2013-08-06 DIAGNOSIS — E669 Obesity, unspecified: Secondary | ICD-10-CM | POA: Insufficient documentation

## 2013-08-06 HISTORY — DX: Essential (primary) hypertension: I10

## 2013-08-06 LAB — COMPREHENSIVE METABOLIC PANEL
ALT: 9 U/L (ref 0–35)
BUN: 11 mg/dL (ref 6–23)
CO2: 23 mEq/L (ref 19–32)
Calcium: 9.7 mg/dL (ref 8.4–10.5)
Creatinine, Ser: 0.7 mg/dL (ref 0.50–1.10)
GFR calc Af Amer: >90 mL/min (ref >90)
GFR calc non Af Amer: >90 mL/min (ref >90)
Glucose, Bld: 92 mg/dL (ref 70–99)
Sodium: 138 mEq/L (ref 135–145)

## 2013-08-06 LAB — CBC WITH DIFFERENTIAL/PLATELET
Eosinophils Absolute: 0 10*3/uL (ref 0.0–0.7)
Eosinophils Relative: 0 % (ref 0–5)
HCT: 38.3 % (ref 36.0–46.0)
Hemoglobin: 12.5 g/dL (ref 12.0–15.0)
Lymphocytes Relative: 20 % (ref 12–46)
Lymphs Abs: 1.8 10*3/uL (ref 0.7–4.0)
MCH: 28.4 pg (ref 26.0–34.0)
MCV: 87 fL (ref 78.0–100.0)
Monocytes Absolute: 0.5 10*3/uL (ref 0.1–1.0)
Monocytes Relative: 6 % (ref 3–12)
RBC: 4.4 MIL/uL (ref 3.87–5.11)
WBC: 9 10*3/uL (ref 4.0–10.5)

## 2013-08-06 LAB — URINALYSIS, ROUTINE W REFLEX MICROSCOPIC
Bilirubin Urine: NEGATIVE
Hgb urine dipstick: NEGATIVE
Nitrite: NEGATIVE
Protein, ur: NEGATIVE mg/dL
Urobilinogen, UA: 0.2 mg/dL (ref 0.0–1.0)

## 2013-08-06 LAB — POCT PREGNANCY, URINE: Preg Test, Ur: NEGATIVE

## 2013-08-06 LAB — LIPASE, BLOOD: Lipase: 12 U/L (ref 11–59)

## 2013-08-06 MED ORDER — ONDANSETRON HCL 4 MG PO TABS: 4.0000 mg | ORAL_TABLET | Freq: Four times a day (QID) | ORAL | Status: DC

## 2013-08-06 MED ORDER — ONDANSETRON 4 MG PO TBDP
4.0000 mg | ORAL_TABLET | Freq: Once | ORAL | Status: AC
  Administered 2013-08-06: 4 mg via ORAL
  Filled 2013-08-06: qty 1

## 2013-08-06 MED ORDER — LISINOPRIL 5 MG PO TABS: 5.0000 mg | ORAL_TABLET | Freq: Every day | ORAL | Status: DC

## 2013-08-06 MED ORDER — OMEPRAZOLE 20 MG PO CPDR: 40.0000 mg | DELAYED_RELEASE_CAPSULE | Freq: Every day | ORAL | Status: DC

## 2013-08-06 MED ORDER — SUCRALFATE 1 G PO TABS: 1.0000 g | ORAL_TABLET | Freq: Three times a day (TID) | ORAL | Status: DC

## 2013-08-06 MED ORDER — ONDANSETRON HCL 4 MG/2ML IJ SOLN
4.0000 mg | Freq: Once | INTRAMUSCULAR | Status: AC
  Administered 2013-08-06: 4 mg via INTRAVENOUS
  Filled 2013-08-06: qty 2

## 2013-08-06 MED ORDER — GI COCKTAIL ~~LOC~~
30.0000 mL | Freq: Once | ORAL | Status: AC
  Administered 2013-08-06: 30 mL via ORAL
  Filled 2013-08-06: qty 30

## 2013-08-06 MED ORDER — MORPHINE SULFATE 4 MG/ML IJ SOLN
4.0000 mg | Freq: Once | INTRAMUSCULAR | Status: AC
  Administered 2013-08-06: 4 mg via INTRAVENOUS
  Filled 2013-08-06: qty 1

## 2013-08-06 MED ORDER — SODIUM CHLORIDE 0.9 % IV BOLUS (SEPSIS)
1000.0000 mL | Freq: Once | INTRAVENOUS | Status: AC
  Administered 2013-08-06: 1000 mL via INTRAVENOUS

## 2013-08-06 MED ORDER — PANTOPRAZOLE SODIUM 40 MG IV SOLR
40.0000 mg | Freq: Once | INTRAVENOUS | Status: AC
  Administered 2013-08-06: 40 mg via INTRAVENOUS
  Filled 2013-08-06: qty 40

## 2013-08-06 NOTE — ED Provider Notes (Signed)
TIME SEEN: 2:57 PM  CHIEF COMPLAINT: Vomiting, epigastric pain, chest pain  HPI: Patient is a 32 year old female with a history of hypertension, obesity who presents emergency department with complaints of vomiting after eating every day for the past 2 weeks. She states today she had an episode of vomiting during this episode had chest pain, shortness of breath and felt very anxious. She had numbness and tingling in both of her hands during this episode. She has had similar symptoms before. No prior history of cardiac disease, pulmonary embolus or DVT. No lower extremity swelling or pain, recent prolonged immobilization, surgery or trauma, fracture. She is not taking exogenous estrogen. She is not a smoker. States she has tried taking Zantac and omeprazole for her epigastric burning pain that this does not resolve her symptoms. States her symptoms are worse after eating. She has had chills over the past 2 days but no fever. No diarrhea. No bloody stools or melena. No alcohol use or heavy NSAID use.  ROS: See HPI Constitutional: no fever  Eyes: no drainage  ENT: no runny nose   Cardiovascular:  no chest pain  Resp: no SOB  GI: no vomiting GU: no dysuria Integumentary: no rash  Allergy: no hives  Musculoskeletal: no leg swelling  Neurological: no slurred speech ROS otherwise negative  PAST MEDICAL HISTORY/PAST SURGICAL HISTORY:  Past Medical History  Diagnosis Date  . Hypertension     MEDICATIONS:  Prior to Admission medications   Not on File    ALLERGIES:  No Known Allergies  SOCIAL HISTORY:  History  Substance Use Topics  . Smoking status: Never Smoker   . Smokeless tobacco: Not on file  . Alcohol Use: No    FAMILY HISTORY: No family history on file.  EXAM: BP 143/83  Pulse 99  Temp(Src) 98.5 F (36.9 C) (Oral)  Resp 20  SpO2 100%  LMP 07/30/2013 CONSTITUTIONAL: Alert and oriented and responds appropriately to questions. Well-appearing; well-nourished HEAD:  Normocephalic EYES: Conjunctivae clear, PERRL ENT: normal nose; no rhinorrhea; moist mucous membranes; pharynx without lesions noted NECK: Supple, no meningismus, no LAD  CARD: RRR; S1 and S2 appreciated; no murmurs, no clicks, no rubs, no gallops RESP: Normal chest excursion without splinting or tachypnea; breath sounds clear and equal bilaterally; no wheezes, no rhonchi, no rales,  ABD/GI: Normal bowel sounds; non-distended; soft, obese, tender to palpation in the epigastric region and right upper quadrant, positive Murphy's sign, no peritoneal signs BACK:  The back appears normal and is non-tender to palpation, there is no CVA tenderness EXT: Normal ROM in all joints; non-tender to palpation; no edema; normal capillary refill; no cyanosis    SKIN: Normal color for age and race; warm NEURO: Moves all extremities equally PSYCH: The patient's mood and manner are appropriate. Grooming and personal hygiene are appropriate.  MEDICAL DECISION MAKING: Patient here with likely panic attack after vomiting.  Vomiting and abdominal pain may be due to cholecystitis versus cholelithiasis versus gastritis versus GERD.  We'll check abdominal labs, right upper quadrant ultrasound.  She has no risk factors for ACS other than HTN and she is PERC negative.  I do not feel her CP, SOB today were cardiac or pulmonary in nature but rather due to anxiety.  ED PROGRESS: Ultrasound shows no abnormality. Urine shows no sign of infection.  Urine pregnancy test negative. Labs pending. Patient reports her pain is improved but has not completely resolved. We'll give GI cocktail.  Pt's labs are unremarkable. She reports improvement in symptoms  after GI cocktail. She still having mild nausea but has been able to tolerate by mouth in ED. Labs are unremarkable. We'll discharge home with prescription for Carafate and Zofran. We'll have her continue her omeprazole 40 mg once daily over-the-counter. Have given return precautions.  Patient is also requesting a refill her lisinopril 5 mg once daily. Patient verbalizes understanding is comfortable plan.   EKG Interpretation    Date/Time:  Monday August 06 2013 15:44:03 EST Ventricular Rate:  100 PR Interval:  146 QRS Duration: 96 QT Interval:  332 QTC Calculation: 428 R Axis:   27 Text Interpretation:  Sinus tachycardia RSR' in V1 or V2, probably normal variant Baseline wander in lead(s) V5 Unchanged compared to March 08 2013 Confirmed by Aliscia Clayton  DO, Martavious Hartel [phone]) on 08/06/2013 3:58:23 PM             Layla Maw Savier Trickett, DO 08/06/13 6213

## 2013-08-06 NOTE — ED Notes (Addendum)
Per EMS pt starting hyperventilating at work; sat down; felt tingling in feet bilaterally. Pt has sensation in feet bilaterally. Per manager at work, pt has hypertension and unable to afford blood pressure medication. Pt denies stressful event causing event. Pt reports n/v starting today.

## 2013-08-06 NOTE — ED Notes (Signed)
Bed: WA07 Expected date:  Expected time:  Means of arrival:  Comments: Chart locked: [facility]

## 2013-08-06 NOTE — ED Notes (Signed)
Use of interpreter for d/c instructions. Pt denies questions.

## 2013-08-06 NOTE — ED Notes (Signed)
Pt use female urinal to void.

## 2013-08-06 NOTE — Progress Notes (Signed)
[facility] provided pt with a list of primary care resources and a [facility] [employer] application.  °

## 2013-08-06 NOTE — ED Notes (Addendum)
Pt given spite per request of EDP, Ward.

## 2013-08-06 NOTE — ED Notes (Signed)
Unable to draw labs from IV. Phlebotomy notified.

## 2013-11-02 ENCOUNTER — Encounter (HOSPITAL_COMMUNITY): Payer: Self-pay | Admitting: Emergency Medicine

## 2013-11-02 ENCOUNTER — Emergency Department (HOSPITAL_COMMUNITY): Payer: Self-pay

## 2013-11-02 ENCOUNTER — Emergency Department (HOSPITAL_COMMUNITY)
Admission: EM | Admit: 2013-11-02 | Discharge: 2013-11-03 | Disposition: A | Discharge status: Home / Self Care | Payer: Self-pay | Attending: Emergency Medicine | Admitting: Emergency Medicine

## 2013-11-02 DIAGNOSIS — R1013 Epigastric pain: Secondary | ICD-10-CM | POA: Insufficient documentation

## 2013-11-02 DIAGNOSIS — R142 Eructation: Secondary | ICD-10-CM | POA: Insufficient documentation

## 2013-11-02 DIAGNOSIS — R1110 Vomiting, unspecified: Secondary | ICD-10-CM | POA: Insufficient documentation

## 2013-11-02 DIAGNOSIS — Z3202 Encounter for pregnancy test, result negative: Secondary | ICD-10-CM | POA: Insufficient documentation

## 2013-11-02 DIAGNOSIS — R141 Gas pain: Secondary | ICD-10-CM | POA: Insufficient documentation

## 2013-11-02 DIAGNOSIS — I10 Essential (primary) hypertension: Secondary | ICD-10-CM | POA: Insufficient documentation

## 2013-11-02 DIAGNOSIS — R143 Flatulence: Secondary | ICD-10-CM

## 2013-11-02 DIAGNOSIS — Z792 Long term (current) use of antibiotics: Secondary | ICD-10-CM | POA: Insufficient documentation

## 2013-11-02 DIAGNOSIS — Z79899 Other long term (current) drug therapy: Secondary | ICD-10-CM | POA: Insufficient documentation

## 2013-11-02 LAB — URINALYSIS, ROUTINE W REFLEX MICROSCOPIC
GLUCOSE, UA: NEGATIVE mg/dL
Hgb urine dipstick: NEGATIVE
KETONES UR: NEGATIVE mg/dL
NITRITE: NEGATIVE
PROTEIN: 30 mg/dL — AB
Specific Gravity, Urine: 1.035 — ABNORMAL HIGH (ref 1.005–1.030)
UROBILINOGEN UA: 0.2 mg/dL (ref 0.0–1.0)
pH: 7.5 (ref 5.0–8.0)

## 2013-11-02 LAB — CBC WITH DIFFERENTIAL/PLATELET
BASOS ABS: 0 10*3/uL (ref 0.0–0.1)
BASOS PCT: 1 % (ref 0–1)
Eosinophils Absolute: 0.4 10*3/uL (ref 0.0–0.7)
Eosinophils Relative: 5 % (ref 0–5)
HEMATOCRIT: 39.7 % (ref 36.0–46.0)
Hemoglobin: 13.3 g/dL (ref 12.0–15.0)
LYMPHS PCT: 32 % (ref 12–46)
Lymphs Abs: 2.4 10*3/uL (ref 0.7–4.0)
MCH: 29.2 pg (ref 26.0–34.0)
MCHC: 33.5 g/dL (ref 30.0–36.0)
MCV: 87.1 fL (ref 78.0–100.0)
Monocytes Absolute: 0.7 10*3/uL (ref 0.1–1.0)
Monocytes Relative: 9 % (ref 3–12)
NEUTROS ABS: 3.9 10*3/uL (ref 1.7–7.7)
NEUTROS PCT: 53 % (ref 43–77)
PLATELETS: 321 10*3/uL (ref 150–400)
RBC: 4.56 MIL/uL (ref 3.87–5.11)
RDW: 14.4 % (ref 11.5–15.5)
WBC: 7.3 10*3/uL (ref 4.0–10.5)

## 2013-11-02 LAB — LIPASE, BLOOD: LIPASE: 12 U/L (ref 11–59)

## 2013-11-02 LAB — COMPREHENSIVE METABOLIC PANEL
ALBUMIN: 3.9 g/dL (ref 3.5–5.2)
ALT: 17 U/L (ref 0–35)
AST: 21 U/L (ref 0–37)
Alkaline Phosphatase: 54 U/L (ref 39–117)
BILIRUBIN TOTAL: 0.3 mg/dL (ref 0.3–1.2)
BUN: 11 mg/dL (ref 6–23)
CHLORIDE: 101 meq/L (ref 96–112)
CO2: 23 meq/L (ref 19–32)
CREATININE: 0.78 mg/dL (ref 0.50–1.10)
Calcium: 9.7 mg/dL (ref 8.4–10.5)
GFR calc Af Amer: >90 mL/min (ref >90)
Glucose, Bld: 92 mg/dL (ref 70–99)
POTASSIUM: 4.1 meq/L (ref 3.7–5.3)
SODIUM: 139 meq/L (ref 137–147)
Total Protein: 7.9 g/dL (ref 6.0–8.3)

## 2013-11-02 LAB — PREGNANCY, URINE: PREG TEST UR: NEGATIVE

## 2013-11-02 LAB — URINE MICROSCOPIC-ADD ON

## 2013-11-02 NOTE — ED Notes (Addendum)
Pt reports having upper abdominal pain for the past two weeks and began having n/v today x 8 episodes and diarrhea. Pt states that she has been evaluated for pain like this previously and her PCP told her she had fibroids. Pt reports menstruating twice this month and was placed on Amoxicillin and Metronidazole on 2/11. Pt is a&o x4, NAD noted at this time.

## 2013-11-02 NOTE — ED Notes (Signed)
Patient requested to undress and change into hospital gown, warm blanket given.

## 2013-11-02 NOTE — ED Notes (Signed)
Patient transported to X-ray 

## 2013-11-03 MED ORDER — PANTOPRAZOLE SODIUM 40 MG IV SOLR
40.0000 mg | Freq: Once | INTRAVENOUS | Status: AC
  Administered 2013-11-03: 40 mg via INTRAVENOUS
  Filled 2013-11-03: qty 40

## 2013-11-03 MED ORDER — GI COCKTAIL ~~LOC~~
30.0000 mL | Freq: Once | ORAL | Status: AC
  Administered 2013-11-03: 30 mL via ORAL
  Filled 2013-11-03: qty 30

## 2013-11-03 MED ORDER — ONDANSETRON HCL 4 MG/2ML IJ SOLN
4.0000 mg | Freq: Once | INTRAMUSCULAR | Status: AC
  Administered 2013-11-03: 4 mg via INTRAVENOUS
  Filled 2013-11-03: qty 2

## 2013-11-03 MED ORDER — ONDANSETRON HCL 4 MG PO TABS
4.0000 mg | ORAL_TABLET | Freq: Four times a day (QID) | ORAL | Status: DC
Start: 2013-11-03 — End: 2014-02-22

## 2013-11-03 MED ORDER — FAMOTIDINE 20 MG PO TABS: 20.0000 mg | ORAL_TABLET | Freq: Two times a day (BID) | ORAL | Status: DC

## 2013-11-03 MED ORDER — SODIUM CHLORIDE 0.9 % IV BOLUS (SEPSIS)
1000.0000 mL | Freq: Once | INTRAVENOUS | Status: AC
  Administered 2013-11-03: 1000 mL via INTRAVENOUS

## 2013-11-03 NOTE — ED Notes (Signed)
Patient given sprite for po challenge.

## 2013-11-03 NOTE — ED Notes (Signed)
Patient c/o upper abd pain x2 weeks and headache.

## 2013-11-03 NOTE — ED Notes (Signed)
Patent discharge instructions reviewed with patient by dr and nurse using pacific interpreters service.

## 2013-11-03 NOTE — Discharge Instructions (Signed)
Dolor abdominal en adultos  (Abdominal Pain, Adult)  El dolor puede tener muchas causas. Normalmente la causa del dolor abdominal no es una enfermedad y mejorará sin tratamiento. Frecuentemente puede controlarse y tratarse en casa. Su médico le realizará un examen físico y posiblemente solicite análisis de sangre y radiografías para ayudar a determinar la gravedad de su dolor. Sin embargo, en muchos casos, debe transcurrir más tiempo antes de que se pueda encontrar una causa evidente del dolor. Antes de llegar a ese punto, es posible que su médico no sepa si necesita más pruebas o un tratamiento más profundo.  INSTRUCCIONES PARA EL CUIDADO EN EL HOGAR   Esté atento al dolor para ver si hay cambios. Las siguientes indicaciones ayudarán a aliviar cualquier molestia que pueda sentir:  · Tome solo medicamentos de venta libre o recetados, según las indicaciones del médico.  · No tome laxantes a menos que se lo haya indicado su médico.  · Pruebe con una dieta líquida absoluta (caldo, té o agua) según se lo indique su médico. Introduzca gradualmente una dieta normal, según su tolerancia.  SOLICITE ATENCIÓN MÉDICA SI:  · Tiene dolor abdominal sin explicación.  · Tiene dolor abdominal relacionado con náuseas o diarrea.  · Tiene dolor cuando orina o defeca.  · Experimenta dolor abdominal que lo despierta de noche.  · Tiene dolor abdominal que empeora o mejora cuando come alimentos.  · Tiene dolor abdominal que empeora cuando come alimentos grasosos.  SOLICITE ATENCIÓN MÉDICA DE INMEDIATO SI:   · El dolor no desaparece en un plazo máximo de 2 horas.  · Tiene fiebre.  · No deja de (vomitar).  · El dolor se siente solo en partes del abdomen, como el lado derecho o la parte inferior izquierda del abdomen.  · Evacúa materia fecal sanguinolenta o negra, de aspecto alquitranado.  ASEGÚRESE DE QUE:  · Comprende estas instrucciones.  · Controlará su afección.  · Recibirá ayuda de inmediato si no mejora o si empeora.  Document  Released: 08/30/2005 Document Revised: 06/20/2013  Exit[employer]® Patient Information ©2014 [employer].

## 2013-11-04 NOTE — ED Provider Notes (Signed)
CSN: [id number]     Arrival date & time 11/02/13  1926 History   First MD Initiated Contact with Patient 11/02/13 2324     Chief Complaint  Patient presents with  . Abdominal Pain  . Emesis     (Consider location/radiation/quality/duration/timing/severity/associated sxs/prior Treatment) HPI History provided by patient. Has had persistent ongoing epigastric pain for the last 2 weeks. She was evaluated by primary care physician at [facility]. she is unable to tell me what her diagnosis was posterior and antibiotics Flagyl and amoxicillin and given samples of Nexium.  She has ran out of Nexium but continues to take antibiotics. She presents today with recurrent epigastric pain, nausea but no vomiting. A sharp in quality and not radiating. She has some belching and some burning pain. No diarrhea. No abdominal pain otherwise. No back pain. Symptoms moderate in severity. No blood in stools. No black or tarry stools. Past Medical History  Diagnosis Date  . Hypertension    History reviewed. No pertinent past surgical history. History reviewed. No pertinent family history. History  Substance Use Topics  . Smoking status: Never Smoker   . Smokeless tobacco: Never Used  . Alcohol Use: No   OB History   Grav Para Term Preterm Abortions TAB SAB Ect Mult Living                 Review of Systems  Constitutional: Negative for fever and chills.  Eyes: Negative for visual disturbance.  Respiratory: Negative for shortness of breath.   Cardiovascular: Negative for chest pain.  Gastrointestinal: Positive for abdominal pain. Negative for vomiting, diarrhea and blood in stool.  Genitourinary: Negative for dysuria and flank pain.  Musculoskeletal: Negative for back pain, neck pain and neck stiffness.  Skin: Negative for rash.  Neurological: Negative for syncope, weakness and numbness.  All other systems reviewed and are negative.      Allergies  Review of patient's allergies indicates no  known allergies.  Home Medications   Current Outpatient Rx  Name  Route  Sig  Dispense  Refill  . amoxicillin (AMOXIL) 500 MG tablet   Oral   Take 500 mg by mouth 3 (three) times daily.         Marland Kitchen lisinopril (PRINIVIL,ZESTRIL) 5 MG tablet   Oral   Take 1 tablet (5 mg total) by mouth daily.   30 tablet   1   . metroNIDAZOLE (FLAGYL) 500 MG tablet   Oral   Take 500 mg by mouth.         . famotidine (PEPCID) 20 MG tablet   Oral   Take 1 tablet (20 mg total) by mouth 2 (two) times daily.   30 tablet   0   . ondansetron (ZOFRAN) 4 MG tablet   Oral   Take 1 tablet (4 mg total) by mouth every 6 (six) hours.   12 tablet   0    BP 106/75  Pulse 70  Temp(Src) 99 F (37.2 C) (Oral)  Resp 18  SpO2 96%  LMP 10/16/2013 Physical Exam  Constitutional: She is oriented to person, place, and time. She appears well-developed and well-nourished.  HENT:  Head: Normocephalic and atraumatic.  Eyes: EOM are normal. Pupils are equal, round, and reactive to light.  Neck: Neck supple.  Cardiovascular: Normal rate, regular rhythm and intact distal pulses.   Pulmonary/Chest: Effort normal and breath sounds normal. No respiratory distress.  Abdominal: Soft. Bowel sounds are normal. She exhibits no distension.  Obese. Tender  epigastric. No abdominal tenderness otherwise. Negative Murphy sign. No CVA tenderness  Musculoskeletal: Normal range of motion. She exhibits no edema.  Neurological: She is alert and oriented to person, place, and time.  Skin: Skin is warm and dry.    ED Course  Procedures (including critical care time) Labs Review Labs Reviewed  URINALYSIS, ROUTINE W REFLEX MICROSCOPIC - Abnormal; Notable for the following:    APPearance CLOUDY (*)    Specific Gravity, Urine 1.035 (*)    Bilirubin Urine SMALL (*)    Protein, ur 30 (*)    Leukocytes, UA MODERATE (*)    All other components within normal limits  URINE MICROSCOPIC-ADD ON - Abnormal; Notable for the following:     Squamous Epithelial / LPF FEW (*)    Casts HYALINE CASTS (*)    All other components within normal limits  PREGNANCY, URINE  CBC WITH DIFFERENTIAL  COMPREHENSIVE METABOLIC PANEL  LIPASE, BLOOD   Imaging Review Dg Abd Acute W/chest  11/02/2013   CLINICAL DATA:  Abdominal pain, nausea and vomiting.  EXAM: ACUTE ABDOMEN SERIES (ABDOMEN 2 VIEW & CHEST 1 VIEW)  COMPARISON:  US ABDOMEN COMPLETE dated 08/06/2013; DG CHEST 2 VIEW dated 03/08/2013; CT ANGIO CHEST W/CM &/OR WO/CM dated 03/08/2012  FINDINGS: The lungs are well-aerated and clear. There is no evidence of focal opacification, pleural effusion or pneumothorax. The cardiomediastinal silhouette is within normal limits.  The visualized bowel gas pattern is unremarkable. Scattered stool and air are seen within the colon; there is no evidence of small bowel dilatation to suggest obstruction. No free intra-abdominal air is identified on the provided upright view.  No acute osseous abnormalities are seen; the sacroiliac joints are unremarkable in appearance.  IMPRESSION: 1. Unremarkable bowel gas pattern; no free intra-abdominal air seen. 2. No acute cardiopulmonary process identified.   Electronically Signed   By: Roanna Raider M.D.   On: 11/02/2013 23:56    EKG Interpretation   None      IV fluids. IV Zofran. IV Protonix. GI cocktail.  On recheck pain improved.  Interpreter phone used and I suspect patient is being treated for H. Pylori. She is no longer taking any acid reducing medications.   Plan discharge home with prescription for Pepcid and Zofran as needed, continue antibiotics and followup with primary care physician on [address] GI referral provided. Patient agrees to strict return precautions. MDM   Final diagnoses:  Epigastric pain   Symptoms improved with GI cocktail and medications. IV fluids provided. Evaluated with labs and imaging reviewed as above. Vital signs and nurses notes reviewed and considered   Sunnie Nielsen,  MD 11/05/13 [phone]

## 2013-12-26 ENCOUNTER — Encounter: Payer: Self-pay | Admitting: Internal Medicine

## 2013-12-26 ENCOUNTER — Ambulatory Visit: Payer: No Typology Code available for payment source | Attending: Internal Medicine | Admitting: Internal Medicine

## 2013-12-26 VITALS — BP 106/70 | HR 66 | Temp 98.9°F | Resp 16 | Ht 63.0 in | Wt 218.0 lb

## 2013-12-26 DIAGNOSIS — Z79899 Other long term (current) drug therapy: Secondary | ICD-10-CM | POA: Insufficient documentation

## 2013-12-26 DIAGNOSIS — R109 Unspecified abdominal pain: Secondary | ICD-10-CM | POA: Insufficient documentation

## 2013-12-26 DIAGNOSIS — M25549 Pain in joints of unspecified hand: Secondary | ICD-10-CM

## 2013-12-26 DIAGNOSIS — M25449 Effusion, unspecified hand: Secondary | ICD-10-CM

## 2013-12-26 DIAGNOSIS — R6884 Jaw pain: Secondary | ICD-10-CM | POA: Insufficient documentation

## 2013-12-26 DIAGNOSIS — K219 Gastro-esophageal reflux disease without esophagitis: Secondary | ICD-10-CM

## 2013-12-26 DIAGNOSIS — E669 Obesity, unspecified: Secondary | ICD-10-CM

## 2013-12-26 DIAGNOSIS — Z7189 Other specified counseling: Secondary | ICD-10-CM

## 2013-12-26 DIAGNOSIS — M26609 Unspecified temporomandibular joint disorder, unspecified side: Secondary | ICD-10-CM

## 2013-12-26 DIAGNOSIS — IMO0001 Reserved for inherently not codable concepts without codable children: Secondary | ICD-10-CM

## 2013-12-26 DIAGNOSIS — I10 Essential (primary) hypertension: Secondary | ICD-10-CM | POA: Insufficient documentation

## 2013-12-26 DIAGNOSIS — Z7689 Persons encountering health services in other specified circumstances: Secondary | ICD-10-CM

## 2013-12-26 LAB — CBC WITH DIFFERENTIAL/PLATELET
Basophils Absolute: 0 10*3/uL (ref 0.0–0.1)
Basophils Relative: 0 % (ref 0–1)
Eosinophils Absolute: 0.2 10*3/uL (ref 0.0–0.7)
Eosinophils Relative: 3 % (ref 0–5)
HCT: 37.8 % (ref 36.0–46.0)
HEMOGLOBIN: 12.7 g/dL (ref 12.0–15.0)
LYMPHS ABS: 2.1 10*3/uL (ref 0.7–4.0)
Lymphocytes Relative: 29 % (ref 12–46)
MCH: 28.2 pg (ref 26.0–34.0)
MCHC: 33.6 g/dL (ref 30.0–36.0)
MCV: 84 fL (ref 78.0–100.0)
MONOS PCT: 7 % (ref 3–12)
Monocytes Absolute: 0.5 10*3/uL (ref 0.1–1.0)
NEUTROS ABS: 4.5 10*3/uL (ref 1.7–7.7)
NEUTROS PCT: 61 % (ref 43–77)
Platelets: 367 10*3/uL (ref 150–400)
RBC: 4.5 MIL/uL (ref 3.87–5.11)
RDW: 14.7 % (ref 11.5–15.5)
WBC: 7.4 10*3/uL (ref 4.0–10.5)

## 2013-12-26 MED ORDER — TRAMADOL HCL 50 MG PO TABS: 50.0000 mg | ORAL_TABLET | Freq: Three times a day (TID) | ORAL | Status: DC | PRN

## 2013-12-26 MED ORDER — PANTOPRAZOLE SODIUM 40 MG PO TBEC: 40.0000 mg | DELAYED_RELEASE_TABLET | Freq: Two times a day (BID) | ORAL | Status: DC

## 2013-12-26 NOTE — Progress Notes (Signed)
Patient ID: Karina Murphy, female   DOB: 1980/11/06, 33 y.o.   MRN: [id number]    Karina Murphy, is a 33 y.o. female  XLK:[id number]  [id number]:[id number]  DOB - [birth year]  CC: abdominal pain, joint swelling, jaw pain      HPI: Karina Murphy is a 33 y.o. female here today to establish medical care.  Patient presents for a number of concerns that have all been present for the past two months.  Patient states that she has been going to the ED for ongoing abdominal pain.  She reports that she was treated for H. Pylori last month and has continued to have ongoing abdominal pain.  She states that she has had continued burning and pressure in her epigastric area that is worst after meals and at night.  She states that the pressure often causes her to cough and vomit.  Patient also presents with pain/tingling in bilateral hands.  Patient states that she has noticed swelling in the joints of her fingers that are often erythematous and warm.  She reports morning stiffness and increased pain in the morning that gradually improves by noon.  Patient states that she has weakness of her hands, loss of grip, and trouble opening and closing her hand.  She states her left pinky finger and right middle finger often "get stuck".  She explains that she has been having pain in her jaw that prevents her from opening her mouth all the way.  She report chills, decreased appetite, and a weight loss of 15 pounds in one month.     No Known Allergies Past Medical History  Diagnosis Date  . Hypertension    Current Outpatient Prescriptions on File Prior to Visit  Medication Sig Dispense Refill  . amoxicillin (AMOXIL) 500 MG tablet Take 500 mg by mouth 3 (three) times daily.      . famotidine (PEPCID) 20 MG tablet Take 1 tablet (20 mg total) by mouth 2 (two) times daily.  30 tablet  0  . lisinopril (PRINIVIL,ZESTRIL) 5 MG tablet Take 1 tablet (5 mg total) by mouth daily.  30 tablet  1  . metroNIDAZOLE  (FLAGYL) 500 MG tablet Take 500 mg by mouth.      . ondansetron (ZOFRAN) 4 MG tablet Take 1 tablet (4 mg total) by mouth every 6 (six) hours.  12 tablet  0   No current facility-administered medications on file prior to visit.   History reviewed. No pertinent family history. History   Social History  . Marital Status: Single    Spouse Name: N/A    Number of Children: N/A  . Years of Education: N/A   Occupational History  . Not on file.   Social History Main Topics  . Smoking status: Never Smoker   . Smokeless tobacco: Never Used  . Alcohol Use: No  . Drug Use: No  . Sexual Activity: Yes    Birth Control/ Protection: Condom   Other Topics Concern  . Not on file   Social History Narrative  . No narrative on file    Review of Systems: Constitutional: Negative for fever, diaphoresis, activity change.Positive for chills, decrease appetite, and fatigue. HENT: Negative for ear pain, nosebleeds, congestion, facial swelling, rhinorrhea, neck pain, neck stiffness and ear discharge.  Eyes: Negative for pain, discharge, redness, itching and visual disturbance. Respiratory: Negative for choking, chest tightness, shortness of breath, wheezing and stridor. Positive for occasional cough at night. Cardiovascular: Negative for chest pain, palpitations and leg swelling. Gastrointestinal: Positive for  abdominal pain Genitourinary: Negative for dysuria, urgency, frequency, hematuria, flank pain, decreased urine volume, difficulty urinating and dyspareunia.  Musculoskeletal: Negative for back pain, gait problem. Positive for joint swelling/redness/warmth, and arthralgias Neurological: Negative for tremors, seizures, syncope, facial asymmetry, speech difficulty, light-headedness, and headaches. Positive for dizziness, weakness, and numbness of bilateral hands. Hematological: Negative for adenopathy. Does not bruise/bleed easily. Psychiatric/Behavioral: Negative for hallucinations, behavioral  problems, confusion, dysphoric mood, decreased concentration and agitation.    Objective:   Filed Vitals:   12/26/13 1544  BP: 106/70  Pulse: 66  Temp: 98.9 F (37.2 C)  Resp: 16    Physical Exam: Constitutional: Patient appears well-developed and well-nourished. No distress. HENT: Normocephalic, atraumatic, External right and left ear normal. Oropharynx is clear and moist.  Eyes: Conjunctivae and EOM are normal. PERRLA, no scleral icterus. Neck: Normal ROM. Neck supple. No JVD. No tracheal deviation. Slightly enlarged thyroid, no nodules noted. CVS: RRR, S1/S2 +, no murmurs, no gallops, no carotid bruit.  Pulmonary: Effort and breath sounds normal, no stridor, rhonchi, wheezes, rales.  Abdominal: Soft. BS +, no distension. Tenderness in epigastric and upper/lower left quadrants Musculoskeletal: Decrease ROM in hands and wrist. Swollen/tender phalangeal joints  Lymphadenopathy: No lymphadenopathy noted, cervical Neuro: Alert. Normal reflexes, muscle tone coordination. No cranial nerve deficit. Skin: Skin is warm and dry. No rash noted. Not diaphoretic. No erythema. No pallor. Psychiatric: Normal mood and affect. Behavior, judgment, thought content normal.  Lab Results  Component Value Date   WBC 7.3 11/02/2013   HGB 13.3 11/02/2013   HCT 39.7 11/02/2013   MCV 87.1 11/02/2013   PLT 321 11/02/2013   Lab Results  Component Value Date   CREATININE 0.78 11/02/2013   BUN 11 11/02/2013   NA 139 11/02/2013   K 4.1 11/02/2013   CL 101 11/02/2013   CO2 23 11/02/2013    No results found for this basename: HGBA1C   Lipid Panel  No results found for this basename: chol, trig, hdl, cholhdl, vldl, ldlcalc       Assessment and plan:   Karina Murphy was seen today for no specified reason.  Diagnoses and associated orders for this visit:  Encounter to establish care - CBC with Differential - TSH - Comprehensive metabolic panel - Vitamin D, 25-hydroxy - Hemoglobin A1c  Joint swelling,  finger, hand - Sedimentation Rate - C-reactive protein - ANA - Cyclic Citrul Peptide Antibody, IGG - Rheumatoid factor  GERD (gastroesophageal reflux disease) - pantoprazole (PROTONIX) 40 MG tablet; Take 1 tablet (40 mg total) by mouth 2 (two) times daily.  Hand joint pain - Discontinue: traMADol (ULTRAM) 50 MG tablet; Take 1 tablet (50 mg total) by mouth every 8 (eight) hours as needed. - traMADol (ULTRAM) 50 MG tablet; Take 1 tablet (50 mg total) by mouth every 8 (eight) hours as needed.  TMJ (temporomandibular joint disorder) - Discontinue: traMADol (ULTRAM) 50 MG tablet; Take 1 tablet (50 mg total) by mouth every 8 (eight) hours as needed. - traMADol (ULTRAM) 50 MG tablet; Take 1 tablet (50 mg total) by mouth every 8 (eight) hours as needed.  Obese    Return in about 3 months (around 03/27/2014) for abdominal pain.  The patient was given clear instructions to go to ER or return to medical center if symptoms don't improve, worsen or new problems develop. The patient verbalized understanding. Explained to patient we will call with results and additional instructions will be given from results.    Interpreter was used to communicate directly with patient  for the entire encounter including providing detailed patient instructions.   Holland Commons, NP-C [facility] [phone] 12/26/2013, 4:32 PM

## 2013-12-26 NOTE — Patient Instructions (Signed)
Disfuncin de la articulacin temporomandibular (Temporomandibular Problems) Una disfuncin de la articulacin temporomandibular implica que existen problemas en la articulacin que se encuentra entre la [place] y el crneo. Esta articulacin est delimitada por un cartlago, como otras articulaciones del cuerpo, pero tambin tiene un pequeo disco que impide que los huesos se raspen uno con otro. Estas articulaciones son como cualquier otra y pueden inflamarse (irritarse) por artritis y Engineer, site. Cuando esta articulacin duele, tambin puede producir dolor de cabeza, Engineer, mining en la mandbula y tambin en el rostro. CAUSAS  Generalmente los problemas de tipo artrtico son producidos por la inflamacin de Nurse, learning disability. La inflamacin tambin puede ser causada por el [place]. Puede haberse ocasionado al [employer] (bruxismo). Tambin puede producirse por la mala alineacin de Nurse, learning disability. DIAGNSTICO Generalmente el diagnstico (determinar cul es el problema) se realiza a travs de los antecedentes y el examen fsico. El profesional le solicitar radiografas o una imagen por resonancia magntica (IRM) para determinar la causa exacta. Podr ser necesario que visite al dentista para determinar si los dientes y la mandbula estn bien alineados. TRATAMIENTO En la mayora de las veces no se trata de un problema grave. En algunos casos puede persistir (hacerse crnico). Cuando esto ocurre, los [employer] reducen la inflamacin (irritacin) pueden ser de [place]. Tambin podr aliviarlo una inyeccin de corticoides. Si los dientes no estn alineados, el dentista lo solucionar con ortodoncia. Si no se encuentran causas fsicas, el origen puede estar en la tensin. Si sta es la causa, le sern de utilidad las tcnicas de biorretroalimentacin y Microbiologist. INSTRUCCIONES PARA EL CUIDADO DOMICILIARIO  Puede aliviarlo la aplicacin de una bolsa con hielo. Coloque el hielo en  una bolsa plstica y envulvala en una toalla para prevenir el congelamiento de la piel. Podr aplicarlo cada 2 horas durante 20  30 minutos, o cuando lo crea necesario, mientras se encuentre despierto, o segn se lo haya indicado el profesional que lo asiste.  Utilice los medicamentos de venta libre o de prescripcin para Chief Technology Officer, Environmental health practitioner o la [place], segn se lo indique el profesional que lo asiste.  Si le indican fisioterapia, siga las indicaciones de su mdico.  Si le indicaron aparatos de ortodoncia, selos segn las indicaciones. Document Released: 06/09/2005 Document Revised: 11/22/2011 [facility] Patient Information 2014 [place], [place].  Dieta para el reflujo gastroesofgico - Adultos  (Diet for Gastroesophageal Reflux Disease, Adult)  El reflujo (reflujo cido) ocurre cuando el cido del estmago pasa al esfago. Cuando el cido entra en contacto con el esfago, el cido provoca dolor e irritacin (inflamacin) en el esfago. Cuando el reflujo ocurre a menudo o es tan grave que causa dao en el esfago, se denomina enfermedad por reflujo gastroesofgico (ERGE). La terapia nutricional puede ayudar a Acupuncturist de la [place].  ALIMENTOS O BEBIDAS QUE DEBE EVITAR O LIMITAR   Fumar o consumir tabaco. La nicotina es uno de los estimulantes ms potentes en la produccin de cido en el tracto gastrointestinal.  Caf y t negro con cafena o descafeinado.  Gaseosas comunes o bajas caloras o bebidas energizantes (las gaseosas sin cafena estn permitidas).   Especias picantes, como la pimienta negra, pimienta blanca, pimienta roja, pimienta de cayena, curry en polvo,y [place] en polvo.  Menta y mentol.  Chocolate.  Alimentos con alto contenido de grasas, incluyendo las carnes y comidas fritas. El agregado de [place] extra, por ejemplo aceite, [place], aderezo para ensaladas y nueces. Limite estos alimentos a menos de 8  cucharaditas por da.  Las frutas y verduras si no son  toleradas, tales como frutas ctricas o tomates.  El alcohol.  Todo alimento que agrave el trastorno. Si tiene dudas relacionadas con la dieta, comunquese con el profesional que lo asiste o con un nutricionista matriculado.  OTROS FACTORES QUE PUEDEN ALIVIAR EL ERGE SON:   Comer lentamente, en un clima distendido.  Hacer 5 o 6 comidas pequeas por da en vez de tres grandes.  Suprimir por un [employer] alimentos que causen problemas.  No acostarse hasta despus de 3 horas de haber comido.  Mantener la cabeza elevada 6 a 9 pulgadas (15 a 23 cm) usando una cua de espuma o bloques debajo de las patas de la cama. Si permanece en una postura plana har empeorar los sntomas.  Mantngase fsicamente activo. Perder peso puede ser de ayuda para reducir el [employer] adultos obesos o con sobrepeso.  Use ropas sueltas. EJEMPLO DE UN PLAN DE ALIMENTACIN  Este plan de alimentacin consiste en aproximadamente 2 000 caloras, segn las guas de alimentacin de https://www.bernard.org/.  Desayuno   taza de avena cocida.  1 porcin de fresas.  1 taza de [employer].  1 oz de almendras. Colacin  1 taza de rebanadas de pepino.  6 oz de yogur (elaborado con [employer] con bajo contenido de grasas o descremada). Almuerzo:  2 rebanada de pan integral.  2 oz de rebanadas de pavo.  2 cucharaditas de mayonesa.  1 taza de arndanos.  1 taza de guisantes. Colacin  6 crackers integrales.  1 oz ( 28 g) de queso en hebras. Cena   taza de arroz integral.  1 taza de vegetales variados.  1 cucharadita de aceite de oliva.  3 oz ( 84 g) de pescado grill. Document Released: 06/09/2005 Document Revised: 11/22/2011 [facility] Patient Information 2014 [place], [place].

## 2013-12-26 NOTE — Progress Notes (Signed)
Pt is here to establish care. Pt reports that for 3 weeks she has been having abdomen pain w/ burning, nausea and vomiting everyday. Pt states that she has been having pain in her b/l hands w/ numbness and tingling. Pt reports that she is not getting hungry.

## 2013-12-27 ENCOUNTER — Other Ambulatory Visit: Payer: Self-pay | Admitting: Internal Medicine

## 2013-12-27 DIAGNOSIS — M069 Rheumatoid arthritis, unspecified: Secondary | ICD-10-CM

## 2013-12-27 LAB — COMPREHENSIVE METABOLIC PANEL
ALT: 11 U/L (ref 0–35)
AST: 12 U/L (ref 0–37)
Albumin: 4.5 g/dL (ref 3.5–5.2)
Alkaline Phosphatase: 56 U/L (ref 39–117)
BUN: 15 mg/dL (ref 6–23)
CALCIUM: 10.3 mg/dL (ref 8.4–10.5)
CHLORIDE: 103 meq/L (ref 96–112)
CO2: 28 meq/L (ref 19–32)
Creat: 0.77 mg/dL (ref 0.50–1.10)
Glucose, Bld: 84 mg/dL (ref 70–99)
Potassium: 4 mEq/L (ref 3.5–5.3)
SODIUM: 138 meq/L (ref 135–145)
TOTAL PROTEIN: 7.9 g/dL (ref 6.0–8.3)
Total Bilirubin: 0.6 mg/dL (ref 0.2–1.2)

## 2013-12-27 LAB — SEDIMENTATION RATE: Sed Rate: 32 mm/hr — ABNORMAL HIGH (ref 0–22)

## 2013-12-27 LAB — C-REACTIVE PROTEIN: CRP: 1.7 mg/dL — ABNORMAL HIGH (ref <0.60)

## 2013-12-27 LAB — HEMOGLOBIN A1C
Hgb A1c MFr Bld: 5.7 % — ABNORMAL HIGH (ref <5.7)
Mean Plasma Glucose: 117 mg/dL — ABNORMAL HIGH (ref <117)

## 2013-12-27 LAB — RHEUMATOID FACTOR

## 2013-12-27 LAB — VITAMIN D 25 HYDROXY (VIT D DEFICIENCY, FRACTURES): VIT D 25 HYDROXY: 22 ng/mL — AB (ref 30–89)

## 2013-12-27 LAB — ANA: Anti Nuclear Antibody(ANA): NEGATIVE

## 2013-12-27 LAB — TSH: TSH: 1.951 u[IU]/mL (ref 0.350–4.500)

## 2013-12-27 LAB — CYCLIC CITRUL PEPTIDE ANTIBODY, IGG: CYCLIC CITRULLIN PEPTIDE AB: 125.4 U/mL — AB (ref 0.0–5.0)

## 2013-12-27 MED ORDER — ERGOCALCIFEROL 1.25 MG (50000 UT) PO CAPS: 50000.0000 [IU] | ORAL_CAPSULE | ORAL | Status: DC

## 2013-12-27 MED ORDER — PREDNISONE 20 MG PO TABS: 20.0000 mg | ORAL_TABLET | ORAL | Status: DC

## 2013-12-27 NOTE — Progress Notes (Signed)
Quick Note:  Please call patient and let her know the lab results came back positive for rheumatoid arthritis. I have print a prescription for a high dose prednisone taper and she needs to take this until we can get her a appt with a rheumatologist. Explain side effects of prednisone to her. Tell her Vit D level is low and I have sent Rx to our pharmacy. She will need a interpreter. Thanks ______

## 2013-12-28 ENCOUNTER — Telehealth: Payer: Self-pay | Admitting: Emergency Medicine

## 2013-12-28 MED ORDER — VITAMIN D (ERGOCALCIFEROL) 1.25 MG (50000 UNIT) PO CAPS: 50000.0000 [IU] | ORAL_CAPSULE | ORAL | Status: DC

## 2013-12-28 NOTE — Telephone Encounter (Signed)
Pt given lab results with medication instructions to start taking prescribed steroid doses with titration. Pt alos explained medication may cause increased appetite and to drink plenty of water Medications for low Vitamin D/Prednisone sent to our pharmacy Pt verbalized understanding per pacific interpretor line for Spanish Pt also informed of Rheumatology referral

## 2013-12-28 NOTE — Telephone Encounter (Signed)
Attempted to reach pt with results; no answer per [facility] interpretor  Will try again. Medication e-scribed to [facility] pharmacy

## 2013-12-28 NOTE — Telephone Encounter (Signed)
Message copied by Darlis Loan on Fri Dec 28, 2013 12:43 PM ------      Message from: Holland Commons A      Created: Thu Dec 27, 2013  6:23 PM       Please call patient and let her know the lab results came back positive for rheumatoid arthritis. I have print a prescription for a high dose prednisone taper and she needs to take this until we can get her a appt with a rheumatologist.  Explain side effects of prednisone to her. Tell her Vit D level is low and I have sent Rx to our pharmacy. She will need a interpreter. Thanks ------

## 2014-02-11 ENCOUNTER — Encounter (HOSPITAL_COMMUNITY): Payer: Self-pay | Admitting: Emergency Medicine

## 2014-02-11 ENCOUNTER — Emergency Department (HOSPITAL_COMMUNITY): Payer: No Typology Code available for payment source

## 2014-02-11 ENCOUNTER — Emergency Department (HOSPITAL_COMMUNITY)
Admission: EM | Admit: 2014-02-11 | Discharge: 2014-02-12 | Disposition: A | Discharge status: Home / Self Care | Payer: No Typology Code available for payment source | Attending: Emergency Medicine | Admitting: Emergency Medicine

## 2014-02-11 ENCOUNTER — Ambulatory Visit: Payer: No Typology Code available for payment source | Attending: Internal Medicine | Admitting: Internal Medicine

## 2014-02-11 ENCOUNTER — Encounter: Payer: Self-pay | Admitting: Internal Medicine

## 2014-02-11 VITALS — BP 113/82 | HR 89 | Temp 99.7°F | Resp 15 | Ht 63.0 in | Wt 230.0 lb

## 2014-02-11 DIAGNOSIS — Z79899 Other long term (current) drug therapy: Secondary | ICD-10-CM | POA: Insufficient documentation

## 2014-02-11 DIAGNOSIS — M86159 Other acute osteomyelitis, unspecified femur: Secondary | ICD-10-CM | POA: Insufficient documentation

## 2014-02-11 DIAGNOSIS — R109 Unspecified abdominal pain: Secondary | ICD-10-CM

## 2014-02-11 DIAGNOSIS — M549 Dorsalgia, unspecified: Secondary | ICD-10-CM | POA: Insufficient documentation

## 2014-02-11 DIAGNOSIS — I10 Essential (primary) hypertension: Secondary | ICD-10-CM | POA: Insufficient documentation

## 2014-02-11 DIAGNOSIS — IMO0002 Reserved for concepts with insufficient information to code with codable children: Secondary | ICD-10-CM | POA: Insufficient documentation

## 2014-02-11 DIAGNOSIS — R112 Nausea with vomiting, unspecified: Secondary | ICD-10-CM | POA: Insufficient documentation

## 2014-02-11 DIAGNOSIS — N760 Acute vaginitis: Secondary | ICD-10-CM | POA: Insufficient documentation

## 2014-02-11 DIAGNOSIS — R197 Diarrhea, unspecified: Secondary | ICD-10-CM | POA: Insufficient documentation

## 2014-02-11 DIAGNOSIS — K219 Gastro-esophageal reflux disease without esophagitis: Secondary | ICD-10-CM | POA: Insufficient documentation

## 2014-02-11 DIAGNOSIS — R1032 Left lower quadrant pain: Secondary | ICD-10-CM | POA: Insufficient documentation

## 2014-02-11 DIAGNOSIS — Z3202 Encounter for pregnancy test, result negative: Secondary | ICD-10-CM | POA: Insufficient documentation

## 2014-02-11 DIAGNOSIS — B9689 Other specified bacterial agents as the cause of diseases classified elsewhere: Secondary | ICD-10-CM

## 2014-02-11 DIAGNOSIS — M869 Osteomyelitis, unspecified: Secondary | ICD-10-CM

## 2014-02-11 DIAGNOSIS — R1011 Right upper quadrant pain: Secondary | ICD-10-CM | POA: Insufficient documentation

## 2014-02-11 DIAGNOSIS — R300 Dysuria: Secondary | ICD-10-CM | POA: Insufficient documentation

## 2014-02-11 DIAGNOSIS — M069 Rheumatoid arthritis, unspecified: Secondary | ICD-10-CM | POA: Insufficient documentation

## 2014-02-11 DIAGNOSIS — R638 Other symptoms and signs concerning food and fluid intake: Secondary | ICD-10-CM | POA: Insufficient documentation

## 2014-02-11 LAB — URINALYSIS, ROUTINE W REFLEX MICROSCOPIC
Bilirubin Urine: NEGATIVE
Glucose, UA: NEGATIVE mg/dL
Hgb urine dipstick: NEGATIVE
KETONES UR: NEGATIVE mg/dL
LEUKOCYTES UA: NEGATIVE
Nitrite: NEGATIVE
PROTEIN: NEGATIVE mg/dL
Specific Gravity, Urine: 1.009 (ref 1.005–1.030)
UROBILINOGEN UA: 0.2 mg/dL (ref 0.0–1.0)
pH: 5.5 (ref 5.0–8.0)

## 2014-02-11 LAB — CBC WITH DIFFERENTIAL/PLATELET
BASOS PCT: 0 % (ref 0–1)
Basophils Absolute: 0 10*3/uL (ref 0.0–0.1)
Eosinophils Absolute: 0.2 10*3/uL (ref 0.0–0.7)
Eosinophils Relative: 2 % (ref 0–5)
HCT: 38.2 % (ref 36.0–46.0)
HEMOGLOBIN: 12.5 g/dL (ref 12.0–15.0)
Lymphocytes Relative: 25 % (ref 12–46)
Lymphs Abs: 2.1 10*3/uL (ref 0.7–4.0)
MCH: 29.7 pg (ref 26.0–34.0)
MCHC: 32.7 g/dL (ref 30.0–36.0)
MCV: 90.7 fL (ref 78.0–100.0)
MONOS PCT: 6 % (ref 3–12)
Monocytes Absolute: 0.5 10*3/uL (ref 0.1–1.0)
NEUTROS ABS: 5.6 10*3/uL (ref 1.7–7.7)
NEUTROS PCT: 67 % (ref 43–77)
PLATELETS: 287 10*3/uL (ref 150–400)
RBC: 4.21 MIL/uL (ref 3.87–5.11)
RDW: 16 % — AB (ref 11.5–15.5)
WBC: 8.4 10*3/uL (ref 4.0–10.5)

## 2014-02-11 LAB — COMPREHENSIVE METABOLIC PANEL
ALT: 13 U/L (ref 0–35)
AST: 11 U/L (ref 0–37)
Albumin: 3.7 g/dL (ref 3.5–5.2)
Alkaline Phosphatase: 52 U/L (ref 39–117)
BILIRUBIN TOTAL: 0.5 mg/dL (ref 0.3–1.2)
BUN: 13 mg/dL (ref 6–23)
CALCIUM: 9.8 mg/dL (ref 8.4–10.5)
CHLORIDE: 102 meq/L (ref 96–112)
CO2: 24 mEq/L (ref 19–32)
Creatinine, Ser: 0.9 mg/dL (ref 0.50–1.10)
GFR calc non Af Amer: 84 mL/min — ABNORMAL LOW (ref >90)
GLUCOSE: 101 mg/dL — AB (ref 70–99)
Potassium: 4.2 mEq/L (ref 3.7–5.3)
Sodium: 139 mEq/L (ref 137–147)
Total Protein: 7.2 g/dL (ref 6.0–8.3)

## 2014-02-11 LAB — POC URINE PREG, ED: PREG TEST UR: NEGATIVE

## 2014-02-11 LAB — POCT URINALYSIS DIPSTICK
BILIRUBIN UA: NEGATIVE
Blood, UA: NEGATIVE
Glucose, UA: NEGATIVE
KETONES UA: NEGATIVE
Leukocytes, UA: NEGATIVE
Nitrite, UA: NEGATIVE
PH UA: 7.5
PROTEIN UA: NEGATIVE
Spec Grav, UA: 1.02
Urobilinogen, UA: 0.2

## 2014-02-11 LAB — WET PREP, GENITAL
Trich, Wet Prep: NONE SEEN
YEAST WET PREP: NONE SEEN

## 2014-02-11 LAB — LIPASE, BLOOD: Lipase: 15 U/L (ref 11–59)

## 2014-02-11 MED ORDER — GABAPENTIN 300 MG PO CAPS: 300.0000 mg | ORAL_CAPSULE | Freq: Every day | ORAL | Status: DC

## 2014-02-11 MED ORDER — MORPHINE SULFATE 4 MG/ML IJ SOLN
4.0000 mg | Freq: Once | INTRAMUSCULAR | Status: AC
  Administered 2014-02-11: 4 mg via INTRAVENOUS
  Filled 2014-02-11: qty 1

## 2014-02-11 MED ORDER — ONDANSETRON HCL 4 MG/2ML IJ SOLN
4.0000 mg | Freq: Once | INTRAMUSCULAR | Status: AC
Start: 2014-02-11 — End: 2014-02-11
  Administered 2014-02-11: 4 mg via INTRAVENOUS
  Filled 2014-02-11: qty 2

## 2014-02-11 MED ORDER — IOHEXOL 300 MG/ML  SOLN
20.0000 mL | INTRAMUSCULAR | Status: AC
  Administered 2014-02-11 (×2): 20 mL via ORAL

## 2014-02-11 NOTE — ED Notes (Signed)
Pt sent over from [facility] with left sided back pain radiates to left lower abdomen, began one week ago associated with nausea, vomiting and diarrhea. Pain rated 9/10. Sent over to r/o appendicitis....reports urinating more frequenting.  Denies right lower abdominal pain, dneis pain at umbilicus.

## 2014-02-11 NOTE — ED Provider Notes (Signed)
CSN: [id number]     Arrival date & time 02/11/14  1721 History   First MD Initiated Contact with Patient 02/11/14 2129     Chief Complaint  Patient presents with  . Abdominal Pain     (Consider location/radiation/quality/duration/timing/severity/associated sxs/prior Treatment) HPI Comments: History obtained through translator. Patient reports one week history of right upper quadrant and left lower quadrant abdominal pain that has been intermittent. Associated with nausea, vomiting and diarrhea. Her primary doctor was worried about appendicitis and sent her here. She denies any fevers, urinary or vaginal symptoms. No previous abdominal surgeries. Only medical problem is rheumatoid arthritis. She denies any vaginal bleeding or discharge. She endorses pain across her low back as well as pain in her right upper quadrant and left lower quadrant. She's had several episodes of vomiting today with multiple episodes of loose stool. Denies any sick contacts or recent travel.   The history is provided by the patient. The history is limited by a language barrier. A language interpreter was used.    Past Medical History  Diagnosis Date  . Hypertension    History reviewed. No pertinent past surgical history. History reviewed. No pertinent family history. History  Substance Use Topics  . Smoking status: Never Smoker   . Smokeless tobacco: Never Used  . Alcohol Use: No   OB History   Grav Para Term Preterm Abortions TAB SAB Ect Mult Living                 Review of Systems  Constitutional: Positive for activity change and appetite change. Negative for fever.  HENT: Negative for congestion and rhinorrhea.   Respiratory: Negative for cough, chest tightness and shortness of breath.   Cardiovascular: Negative for chest pain.  Gastrointestinal: Positive for nausea, vomiting, abdominal pain and diarrhea.  Genitourinary: Negative for dysuria, hematuria, vaginal bleeding and vaginal discharge.   Musculoskeletal: Positive for back pain. Negative for arthralgias and myalgias.  Skin: Negative for rash.  Neurological: Negative for dizziness, weakness and headaches.  A complete 10 system review of systems was obtained and all systems are negative except as noted in the HPI and PMH.      Allergies  Review of patient's allergies indicates no known allergies.  Home Medications   Prior to Admission medications   Medication Sig Start Date End Date Taking? Authorizing Provider  amoxicillin (AMOXIL) 500 MG tablet Take 500 mg by mouth 3 (three) times daily.    Historical Provider, MD  ergocalciferol (DRISDOL) 50000 UNITS capsule Take 1 capsule (50,000 Units total) by mouth once a week. 12/27/13   Ambrose Finland, NP  famotidine (PEPCID) 20 MG tablet Take 1 tablet (20 mg total) by mouth 2 (two) times daily. 11/03/13   Sunnie Nielsen, MD  lisinopril (PRINIVIL,ZESTRIL) 5 MG tablet Take 1 tablet (5 mg total) by mouth daily. 08/06/13   Kristen N Ward, DO  meloxicam (MOBIC) 15 MG tablet Take 1 tablet (15 mg total) by mouth daily. 02/12/14   Phill Mutter Dammen, PA-C  metroNIDAZOLE (FLAGYL) 500 MG tablet Take 500 mg by mouth.    Historical Provider, MD  metroNIDAZOLE (FLAGYL) 500 MG tablet Take 1 tablet (500 mg total) by mouth 2 (two) times daily. 02/12/14   Phill Mutter Dammen, PA-C  ondansetron (ZOFRAN) 4 MG tablet Take 1 tablet (4 mg total) by mouth every 6 (six) hours. 11/03/13   Sunnie Nielsen, MD  pantoprazole (PROTONIX) 40 MG tablet Take 1 tablet (40 mg total) by mouth 2 (two) times daily.  12/26/13   Ambrose Finland, NP  predniSONE (DELTASONE) 20 MG tablet Take 1 tablet (20 mg total) by mouth as directed. Take 60 mg every 6 hours for 3 days, 40 mg every six hours for 3 days, 20 mg every 6 hours for 3 days, 20 mg daily 12/27/13   Ambrose Finland, NP  sucralfate (CARAFATE) 1 G tablet Take 1 g by mouth 4 (four) times daily -  with meals and at bedtime.    Historical Provider, MD  traMADol (ULTRAM) 50 MG tablet Take 1  tablet (50 mg total) by mouth every 8 (eight) hours as needed. 12/26/13   Ambrose Finland, NP  Vitamin D, Ergocalciferol, (DRISDOL) 50000 UNITS CAPS capsule Take 1 capsule (50,000 Units total) by mouth every 7 (seven) days. 12/28/13   Ambrose Finland, NP   BP 111/76  Pulse 86  Temp(Src) 98.6 F (37 C) (Oral)  Resp 16  SpO2 98%  LMP 02/04/2014 Physical Exam  Nursing note and vitals reviewed. Constitutional: She is oriented to person, place, and time. She appears well-developed and well-nourished. No distress.  HENT:  Head: Normocephalic and atraumatic.  Mouth/Throat: Oropharynx is clear and moist.  Eyes: Conjunctivae and EOM are normal. Pupils are equal, round, and reactive to light.  Neck: Normal range of motion. Neck supple.  Cardiovascular: Normal rate, regular rhythm and normal heart sounds.   No murmur heard. Pulmonary/Chest: Effort normal and breath sounds normal. No respiratory distress.  Abdominal: Soft. There is tenderness. There is no rebound and no guarding.  Tender in the right upper quadrant and left lower quadrant. No guarding or rebound.  Genitourinary: Vaginal discharge found.  Chaperone present. Normal external genitalia. White vaginal discharge. No cervical motion tenderness. No lateralizing adnexal tenderness.  Musculoskeletal: Normal range of motion. She exhibits no edema and no tenderness.  No CVA tenderness  Neurological: She is alert and oriented to person, place, and time. No cranial nerve deficit. She exhibits normal muscle tone. Coordination normal.  Skin: Skin is warm. No rash noted.    ED Course  Procedures (including critical care time) Labs Review Labs Reviewed  WET PREP, GENITAL - Abnormal; Notable for the following:    Clue Cells Wet Prep HPF POC FEW (*)    WBC, Wet Prep HPF POC MODERATE (*)    All other components within normal limits  COMPREHENSIVE METABOLIC PANEL - Abnormal; Notable for the following:    Glucose, Bld 101 (*)    GFR calc non Af  Amer 84 (*)    All other components within normal limits  CBC WITH DIFFERENTIAL - Abnormal; Notable for the following:    RDW 16.0 (*)    All other components within normal limits  GC/CHLAMYDIA PROBE AMP  LIPASE, BLOOD  URINALYSIS, ROUTINE W REFLEX MICROSCOPIC  POC URINE PREG, ED    Imaging Review Ct Abdomen Pelvis W Contrast  02/12/2014   CLINICAL DATA:  Abdominal pain  EXAM: CT ABDOMEN AND PELVIS WITH CONTRAST  TECHNIQUE: Multidetector CT imaging of the abdomen and pelvis was performed using the standard protocol following bolus administration of intravenous contrast.  CONTRAST:  OMNIPAQUE IOHEXOL 300 MG/ML  SOLN  COMPARISON:  None.  FINDINGS: BODY WALL: Unremarkable.  LOWER CHEST: Unremarkable.  ABDOMEN/PELVIS:  Liver: No focal abnormality.  Biliary: No evidence of biliary obstruction or stone.  Pancreas: Unremarkable.  Spleen: Unremarkable.  Adrenals: Unremarkable.  Kidneys and ureters: No hydronephrosis or stone.  Bladder: Unremarkable.  Reproductive: Unremarkable.  Corpus luteum noted on  the right.  Bowel: No obstruction. Negative appendix.  Retroperitoneum: No mass or adenopathy.  Peritoneum: No free fluid or gas.  Vascular: No acute abnormality.  OSSEOUS: Sclerosis around both SI joints is likely condensing osteitis. There are no erosions, rather there is mild bony productive change without ankylosis. 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  IMPRESSION: No acute intra-abdominal abnormality.   Electronically Signed   By: Tiburcio Pea M.D.   On: 02/12/2014 00:51   US Abdomen Limited Ruq  02/11/2014   CLINICAL DATA:  Right upper quadrant pain.  EXAM: US ABDOMEN LIMITED - RIGHT UPPER QUADRANT  COMPARISON:  None.  FINDINGS: Gallbladder:  No gallstones or wall thickening visualized. No sonographic Murphy sign noted.  Common bile duct:  Diameter: 4 mm  Liver:  No focal lesion identified. Within normal limits in parenchymal echogenicity.  IMPRESSION: Normal right  upper quadrant ultrasound.   Electronically Signed   By: Amie Portland M.D.   On: 02/11/2014 22:35     EKG Interpretation None      MDM   Final diagnoses:  Abdominal pain  Bacterial vaginosis  Osteitis of pelvic region   1 week History of right upper quadrant pain left lower quadrant pain. Associated with nausea, vomiting and diarrhea. Abdomen soft without peritoneal signs.  Labs and urinalysis negative. Pelvic exam benign. BV on wet prep. [place] RUQ shows no gallstones.  CT pending at time of sign out to PA Dammen. Patient in no distress, no vomiting in the ED.    Glynn Octave, MD 02/12/14 [phone]

## 2014-02-11 NOTE — Progress Notes (Signed)
Pt reports having pain in her left lower back and the pain is radiating around her left abdomen. Pt states that she is having unexplained weight gain. For two weeks pt states that she has been feeling faint and dizzy.

## 2014-02-11 NOTE — Progress Notes (Addendum)
Patient ID: Karina Murphy, female   DOB: Feb 20, 1981, 33 y.o.   MRN: [id number]  CC: f/u  HPI: Patient presents today for a follow up visit.  She reports a 14 lb weight gain since last visit.  Patient just completed high dose prednisone for new diagnosis of rheumatoid arthritis.  Patient has her first appointment with the rheumatologist at [facility] next month.  Patient reports great improvement in RA symptoms.  She notes improvement in acid reflux since beginning protonix.  Has had problems with abdominal pain in LLQ for 1 week. Has had fevers, nausea, and vomiting. Has had headaches and chills.  Ocassionally strong pain in back. If lay on left side causes pain.  Frequent urination with burning.  Denies hematuria.  Patient denies unprotected sex.   No Known Allergies Past Medical History  Diagnosis Date  . Hypertension    Current Outpatient Prescriptions on File Prior to Visit  Medication Sig Dispense Refill  . ergocalciferol (DRISDOL) 50000 UNITS capsule Take 1 capsule (50,000 Units total) by mouth once a week.  10 capsule  0  . pantoprazole (PROTONIX) 40 MG tablet Take 1 tablet (40 mg total) by mouth 2 (two) times daily.  60 tablet  3  . amoxicillin (AMOXIL) 500 MG tablet Take 500 mg by mouth 3 (three) times daily.      . famotidine (PEPCID) 20 MG tablet Take 1 tablet (20 mg total) by mouth 2 (two) times daily.  30 tablet  0  . lisinopril (PRINIVIL,ZESTRIL) 5 MG tablet Take 1 tablet (5 mg total) by mouth daily.  30 tablet  1  . metroNIDAZOLE (FLAGYL) 500 MG tablet Take 500 mg by mouth.      . ondansetron (ZOFRAN) 4 MG tablet Take 1 tablet (4 mg total) by mouth every 6 (six) hours.  12 tablet  0  . predniSONE (DELTASONE) 20 MG tablet Take 1 tablet (20 mg total) by mouth as directed. Take 60 mg every 6 hours for 3 days, 40 mg every six hours for 3 days, 20 mg every 6 hours for 3 days, 20 mg daily  100 tablet  0  . sucralfate (CARAFATE) 1 G tablet Take 1 g by mouth 4 (four) times  daily -  with meals and at bedtime.      . traMADol (ULTRAM) 50 MG tablet Take 1 tablet (50 mg total) by mouth every 8 (eight) hours as needed.  30 tablet  0  . Vitamin D, Ergocalciferol, (DRISDOL) 50000 UNITS CAPS capsule Take 1 capsule (50,000 Units total) by mouth every 7 (seven) days.  12 capsule  0   No current facility-administered medications on file prior to visit.   History reviewed. No pertinent family history. History   Social History  . Marital Status: Single    Spouse Name: N/A    Number of Children: N/A  . Years of Education: N/A   Occupational History  . Not on file.   Social History Main Topics  . Smoking status: Never Smoker   . Smokeless tobacco: Never Used  . Alcohol Use: No  . Drug Use: No  . Sexual Activity: Yes    Birth Control/ Protection: Condom   Other Topics Concern  . Not on file   Social History Narrative  . No narrative on file    Review of Systems: Constitutional: Negative for fever, chills, diaphoresis, activity change, appetite change and fatigue. HENT: Negative for ear pain, nosebleeds, congestion, facial swelling, rhinorrhea, neck pain, neck stiffness and ear discharge.  Eyes: Negative for pain, discharge, redness, itching and visual disturbance. Respiratory: Negative for cough, choking, chest tightness, shortness of breath, wheezing and stridor.  Cardiovascular: Negative for chest pain, palpitations and leg swelling. Gastrointestinal: Negative for abdominal distention. Genitourinary: Negative for dysuria, urgency, frequency, hematuria, flank pain, decreased urine volume, difficulty urinating and dyspareunia.  Musculoskeletal: Negative for back pain, joint swelling, arthralgias and gait problem. Neurological: Negative for dizziness, tremors, seizures, syncope, facial asymmetry, speech difficulty, weakness, light-headedness, numbness and headaches.  Hematological: Negative for adenopathy. Does not bruise/bleed  easily. Psychiatric/Behavioral: Negative for hallucinations, behavioral problems, confusion, dysphoric mood, decreased concentration and agitation.    Objective:   Filed Vitals:   02/11/14 1459  BP: 113/82  Pulse: 89  Temp: 99.7 F (37.6 C)  Resp: 15   Physical Exam  Constitutional: She is oriented to person, place, and time.  Neck: Normal range of motion. Neck supple.  Abdominal: Soft. Bowel sounds are normal. She exhibits no distension. There is tenderness. There is rebound. There is no guarding.  Rebound tenderness, positive obturator sign. Pain in epigastric and LLQ.   Musculoskeletal: Normal range of motion.  Neurological: She is alert and oriented to person, place, and time. She has normal reflexes.  Skin: Skin is warm and dry.  Psychiatric: She has a normal mood and affect.     Lab Results  Component Value Date   WBC 7.4 12/26/2013   HGB 12.7 12/26/2013   HCT 37.8 12/26/2013   MCV 84.0 12/26/2013   PLT 367 12/26/2013   Lab Results  Component Value Date   CREATININE 0.77 12/26/2013   BUN 15 12/26/2013   NA 138 12/26/2013   K 4.0 12/26/2013   CL 103 12/26/2013   CO2 28 12/26/2013    Lab Results  Component Value Date   HGBA1C 5.7* 12/26/2013   Lipid Panel  No results found for this basename: chol, trig, hdl, cholhdl, vldl, ldlcalc       Assessment and plan:   Karina Murphy was seen today for follow-up.  Diagnoses and associated orders for this visit:  Abdominal pain, left lower quadrant Patient will be transferred to the ER to r/o pancreatitis and cholecystitis.  Urine dipstick was negative and patient denies sexual activity so STD's not likely.  Back pain  Dysuria - POCT urinalysis dipstick-negative  Rheumatoid arthritis Keep f/u with rheumatologist for RA management. Explained that recent weight gain is from high dose steroid use and that she will need to drink plenty of water and to exercise to manage weight.   Follow [place] as needed. Signs and symptoms given  that warrant the need to r/o certain conditions.   Due to language barrier, an interpreter was present during the history-taking and subsequent discussion (and for part of the physical exam) with this patient.   Ambrose Finland, NP-C [facility] [phone] 02/12/2014, 8:42 PM

## 2014-02-11 NOTE — Progress Notes (Signed)
Pt being transferred to [place] via private vehicle. Report given to triage nurse, Neldon Labella

## 2014-02-12 ENCOUNTER — Emergency Department (HOSPITAL_COMMUNITY): Payer: No Typology Code available for payment source

## 2014-02-12 ENCOUNTER — Encounter (HOSPITAL_COMMUNITY): Payer: Self-pay | Admitting: Radiology

## 2014-02-12 DIAGNOSIS — M069 Rheumatoid arthritis, unspecified: Secondary | ICD-10-CM | POA: Insufficient documentation

## 2014-02-12 DIAGNOSIS — M059 Rheumatoid arthritis with rheumatoid factor, unspecified: Secondary | ICD-10-CM | POA: Insufficient documentation

## 2014-02-12 LAB — GC/CHLAMYDIA PROBE AMP
CT PROBE, AMP APTIMA: NEGATIVE
GC Probe RNA: NEGATIVE

## 2014-02-12 MED ORDER — METRONIDAZOLE 500 MG PO TABS
500.0000 mg | ORAL_TABLET | Freq: Two times a day (BID) | ORAL | Status: DC
Start: 2014-02-12 — End: 2014-04-17

## 2014-02-12 MED ORDER — KETOROLAC TROMETHAMINE 30 MG/ML IJ SOLN
30.0000 mg | Freq: Once | INTRAMUSCULAR | Status: AC
  Administered 2014-02-12: 30 mg via INTRAVENOUS
  Filled 2014-02-12: qty 1

## 2014-02-12 MED ORDER — MELOXICAM 15 MG PO TABS: 15.0000 mg | ORAL_TABLET | Freq: Every day | ORAL | Status: DC

## 2014-02-12 MED ORDER — IOHEXOL 300 MG/ML  SOLN
100.0000 mL | Freq: Once | INTRAMUSCULAR | Status: AC | PRN
  Administered 2014-02-12: 100 mL via INTRAVENOUS

## 2014-02-12 NOTE — ED Provider Notes (Signed)
Karina Murphy 1:00 AM patient discussed in sign out. Patient with right upper and left lower abdominal pains. Some signs of BV on pelvic exam. Ultrasound negative. CT scan pending.   1:20 AM CT scan negative. There is some signs of inflammation around the SI joint suggestive of osteitis. Otherwise CT scan unremarkable. Normal appendix. At this time we'll recommend NSAIDs for her osteitis and pain. Will also give Flagyl for BV. Strict return precautions given.  Angus Seller, PA-C 02/12/14 [phone]

## 2014-02-12 NOTE — ED Provider Notes (Signed)
Medical screening examination/treatment/procedure(s) were conducted as a shared visit with non-physician practitioner(s) and myself.  I personally evaluated the patient during the encounter.   EKG Interpretation None       Glynn Octave, MD 02/12/14 [phone]

## 2014-02-12 NOTE — Discharge Instructions (Signed)
You were seen and evaluated for your abdominal pains. Your testing has not shown any signs for an emergent condition causing her pains. You were found to have a bacterial vaginosis infection. Please take the medication prescribed to treat your infection. You were also found to have some inflammation of the joints in your pelvis on your CAT scan. Use anti-inflammatory pain medicine to help this and followup with your doctor for continued evaluation and treatment. Return for any changing or worsening symptoms.    Dolor abdominal en las mujeres (Abdominal Pain, Women) El dolor abdominal (en el estmago, la pelvis o el vientre) puede tener muchas causas. Es importante que le informe a su mdico:  La ubicacin del Engineer, mining.  Viene y se va, o persiste todo el tiempo?  Hay situaciones que Location manager (comer ciertos alimentos, la actividad fsica)?  Tiene otros sntomas asociados al dolor (fiebre, nuseas, vmitos, diarrea)? Todo es de gran ayuda cuando se trata de hallar la causa del dolor. CAUSAS  Estmago: Infecciones por virus o bacterias, o lcera.  Intestino: Apendicitis (apndice inflamado), ileitis regional (enfermedad de Crohn), colitis ulcerosa (colon inflamado), sndrome del colon irritable, diverticulitis (inflamacin de los divertculos del colon) o cncer de estmago oo intestino.  Enfermedades de la vescula biliar o clculos.  Enfermedades renales, clculos o infecciones en el rin.  Infeccin o cncer del pncreas.  Fibromialgia (trastorno doloroso)  Enfermedades de los rganos femeninos:  Uterus: tero: fibroma (tumor no canceroso) o infeccin  Trompas de [place]: infeccin o embarazo ectpico  En los ovarios, quistes o tumores.  Adherencias plvicas (tejido cicatrizal).  Endometriosis (el tejido que cubre el tero se desarrolla en la pelvis y los rganos plvicos).  Sndrome de Agricultural engineer (los rganos femeninos se llenan de sangre antes del periodo  menstrual(  Dolor durante el periodo menstrual.  Dolor durante la ovulacin (al producir vulos).  Dolor al usar el DIU (dispositivo intrauterino para el control de la natalidad)  Psychologist, clinical los rganos femeninos.  Dolor funcional (no est originado en una enfermedad, puede mejorar sin tratamiento).  Dolor de origen psicolgico  Depresin. DIAGNSTICO Su mdico decidir la gravedad del dolor a travs del examen fsico  Anlisis de sangre  Radiografas  Ecografas  TC (tomografa computada, tipo especial de radiografas).  IMR (resonancia magntica)  Cultivos, en el caso una infeccin  Colon por enema de bario (se inserta una sustancia de contraste en el intestino grueso para mejorar la observacin con rayos X.)  Colonoscopa (observacin del intestino con un tubo luminoso).  Laparoscopa (examen del interior del abdomen con un tubo que tiene [employer]).  Ciruga exploratoria abdominal mayor (se observa el abdomen realizando una gran incisin). TRATAMIENTO El tratamiento depender de la causa del problema.   Muchos de estos casos pueden controlarse y tratarse en casa.  Medicamentos de venta libre indicados por el mdico.  Medicamentos con receta.  Antibiticos, en caso de infeccin  Pldoras anticonceptivas, en el caso de perodos dolorosos o dolor al ovular.  Tratamiento hormonal, para la endometriosis  Inyecciones para bloqueo nervioso selectivo.  Fisioterapia.  Antidepresivos.  Consejos por parte de un psclogo o psiquiatra.  Ciruga mayor o menor. INSTRUCCIONES PARA EL CUIDADO DOMICILIARIO  No tome ni administre laxantes a menos que se lo haya indicado su mdico.  Tome analgsicos de venta libre slo si se lo ha indicado el profesional que lo asiste. No tome aspirina, ya que puede causar [employer] o hemorragias.  Consuma una dieta lquida (caldo o  agua) segn lo indicado por el mdico. Progrese lentamente a una dieta blanda, segn la  tolerancia, si el dolor se relaciona con el estmago o el intestino.  Tenga un termmetro y tmese la temperatura varias veces al da.  Haga reposo en la cama y [place], si esto Research scientist (life sciences).  Evite las relaciones sexuales, Counsellor.  Evite las situaciones estresantes.  Cumpla con las visitas y los anlisis de control, segn las indicaciones de su mdico.  Si el dolor no se [place] con los medicamentos o la [place], [place] tratar con:  Acupuntura.  Ejercicios de relajacin (yoga, meditacin).  Terapia grupal.  Psicoterapia. SOLICITE ATENCIN MDICA SI:  Nota que ciertos Pharmacist, community de [place].  El tratamiento indicado para Arboriculturist no Marketing executive.  Necesita analgsicos ms fuertes.  Quiere que le retiren el DIU.  Si se siente confundido o desfalleciente.  Presenta nuseas o vmitos.  Aparece una erupcin cutnea.  Sufre efectos adversos o una reaccin alrgica debido a los medicamentos que toma. SOLICITE ATENCIN MDICA DE INMEDIATO SI:  El dolor persiste o se agrava.  Tiene fiebre.  Siente el dolor slo en algunos sectores del abdomen. Si se localiza en la zona derecha, posiblemente podra tratarse de apendicitis. En un adulto, si se localiza en la regin inferior izquierda del abdomen, podra tratarse de colitis o diverticulitis.  Hay sangre en las heces (deposiciones de color rojo brillante o negro alquitranado), con o sin vmitos.  Usted presenta sangre en la orina.  Siente escalofros con o sin fiebre.  Se desmaya. ASEGRESE QUE:   Comprende estas instrucciones.  Controlar su enfermedad.  Solicitar ayuda de inmediato si no mejora o si empeora. Document Released: 12/16/2008 Document Revised: 11/22/2011 [facility] Patient Information 2014 [place], [place].    Vaginosis bacteriana (Bacterial Vaginosis) La vaginosis bacteriana es una infeccin de la vagina. Se produce cuando una cantidad excesiva de  ciertos grmenes (bacterias) crece en la vagina. CUIDADOS EN EL HOGAR  Tome los medicamentos tal como se lo indic su mdico.  Finalice la prescripcin completa, aunque comience a sentirse mejor.  No mantenga relaciones sexuales hasta que finalice sus medicamentos y se Therapist, occupational.  Comunique a sus compaeros sexuales que sufre una infeccin. Deben consultar a su mdico para iniciar un tratamiento.  Practique el sexo seguro. Use preservativos. Tenga solo un compaero sexual. SOLICITE AYUDA SI:  No mejora luego de 3 das de [place].  Observa una secrecin (prdida) de color gris ms abundante que proviene de la vagina.  Siente ms dolor que antes.  Tiene fiebre. ASEGRESE DE QUE:   Comprende estas instrucciones.  Controlar su afeccin.  Recibir ayuda de inmediato si no mejora o si empeora. Document Released: 11/26/2008 Document Revised: 06/20/2013 [facility] Patient Information 2014 [place], [place].

## 2014-02-13 ENCOUNTER — Ambulatory Visit: Payer: Self-pay | Admitting: Internal Medicine

## 2014-02-22 ENCOUNTER — Ambulatory Visit: Payer: No Typology Code available for payment source | Attending: Internal Medicine | Admitting: Internal Medicine

## 2014-02-22 ENCOUNTER — Encounter: Payer: Self-pay | Admitting: Internal Medicine

## 2014-02-22 VITALS — BP 124/84 | HR 98 | Temp 99.0°F | Resp 16

## 2014-02-22 DIAGNOSIS — R1012 Left upper quadrant pain: Secondary | ICD-10-CM | POA: Insufficient documentation

## 2014-02-22 DIAGNOSIS — M069 Rheumatoid arthritis, unspecified: Secondary | ICD-10-CM | POA: Insufficient documentation

## 2014-02-22 DIAGNOSIS — R112 Nausea with vomiting, unspecified: Secondary | ICD-10-CM | POA: Insufficient documentation

## 2014-02-22 DIAGNOSIS — R109 Unspecified abdominal pain: Secondary | ICD-10-CM

## 2014-02-22 DIAGNOSIS — Z79899 Other long term (current) drug therapy: Secondary | ICD-10-CM | POA: Insufficient documentation

## 2014-02-22 DIAGNOSIS — K3184 Gastroparesis: Secondary | ICD-10-CM | POA: Insufficient documentation

## 2014-02-22 MED ORDER — AZITHROMYCIN 250 MG PO TABS: ORAL_TABLET | ORAL | Status: DC

## 2014-02-22 MED ORDER — METOCLOPRAMIDE HCL 10 MG PO TABS: 10.0000 mg | ORAL_TABLET | Freq: Three times a day (TID) | ORAL | Status: DC | PRN

## 2014-02-22 MED ORDER — ONDANSETRON 4 MG PO TBDP
4.0000 mg | ORAL_TABLET | Freq: Once | ORAL | Status: AC
  Administered 2014-02-22: 4 mg via ORAL

## 2014-02-22 MED ORDER — GI COCKTAIL ~~LOC~~
30.0000 mL | Freq: Once | ORAL | Status: AC
  Administered 2014-02-22: 30 mL via ORAL

## 2014-02-22 MED ORDER — ONDANSETRON 4 MG PO TBDP: 4.0000 mg | ORAL_TABLET | Freq: Three times a day (TID) | ORAL | Status: DC | PRN

## 2014-02-22 NOTE — Progress Notes (Signed)
Patient ID: Karina Murphy, female   DOB: [birth year], 33 y.o.   MRN: [id number]  CC: abdominal pain  HPI:  Patient reports to clinic today for continued abdominal pain.  Patient was sent to the ER on last visit for left quadrant abdominal pain to r/o appendicitis.  Patient reports the pain is sharp in her left upper quadrant and radiates to her rib and back.  She admits intermittent nausea and vomiting.  She reports nausea episodes three to four times per day.  This problem has been present for 2 weeks.  Pain is worst postprandial.  Pain is not relieved by defecation.   No Known Allergies Past Medical History  Diagnosis Date  . Hypertension    Current Outpatient Prescriptions on File Prior to Visit  Medication Sig Dispense Refill  . amoxicillin (AMOXIL) 500 MG tablet Take 500 mg by mouth 3 (three) times daily.      . ergocalciferol (DRISDOL) 50000 UNITS capsule Take 1 capsule (50,000 Units total) by mouth once a week.  10 capsule  0  . famotidine (PEPCID) 20 MG tablet Take 1 tablet (20 mg total) by mouth 2 (two) times daily.  30 tablet  0  . lisinopril (PRINIVIL,ZESTRIL) 5 MG tablet Take 1 tablet (5 mg total) by mouth daily.  30 tablet  1  . meloxicam (MOBIC) 15 MG tablet Take 1 tablet (15 mg total) by mouth daily.  30 tablet  0  . metroNIDAZOLE (FLAGYL) 500 MG tablet Take 500 mg by mouth.      . metroNIDAZOLE (FLAGYL) 500 MG tablet Take 1 tablet (500 mg total) by mouth 2 (two) times daily.  14 tablet  0  . ondansetron (ZOFRAN) 4 MG tablet Take 1 tablet (4 mg total) by mouth every 6 (six) hours.  12 tablet  0  . pantoprazole (PROTONIX) 40 MG tablet Take 1 tablet (40 mg total) by mouth 2 (two) times daily.  60 tablet  3  . predniSONE (DELTASONE) 20 MG tablet Take 1 tablet (20 mg total) by mouth as directed. Take 60 mg every 6 hours for 3 days, 40 mg every six hours for 3 days, 20 mg every 6 hours for 3 days, 20 mg daily  100 tablet  0  . sucralfate (CARAFATE) 1 G tablet Take 1 g by mouth 4  (four) times daily -  with meals and at bedtime.      . traMADol (ULTRAM) 50 MG tablet Take 1 tablet (50 mg total) by mouth every 8 (eight) hours as needed.  30 tablet  0  . Vitamin D, Ergocalciferol, (DRISDOL) 50000 UNITS CAPS capsule Take 1 capsule (50,000 Units total) by mouth every 7 (seven) days.  12 capsule  0   No current facility-administered medications on file prior to visit.   No family history on file. History   Social History  . Marital Status: Single    Spouse Name: N/A    Number of Children: N/A  . Years of Education: N/A   Occupational History  . Not on file.   Social History Main Topics  . Smoking status: Never Smoker   . Smokeless tobacco: Never Used  . Alcohol Use: No  . Drug Use: No  . Sexual Activity: Yes    Birth Control/ Protection: Condom   Other Topics Concern  . Not on file   Social History Narrative  . No narrative on file    Review of Systems: See HPI  Objective:   Filed Vitals:   02/22/14  1504  BP: 124/84  Pulse: 98  Temp: 99 F (37.2 C)  Resp: 16   Physical Exam  Constitutional: She is oriented to person, place, and time.  Cardiovascular: Normal rate, regular rhythm, normal heart sounds and intact distal pulses.   Pulmonary/Chest: Effort normal and breath sounds normal.  Abdominal: Soft. Normal appearance and bowel sounds are normal. There is tenderness in the epigastric area and left upper quadrant. There is no rigidity, no rebound, no guarding, no tenderness at McBurney's point and negative Murphy's sign.  Musculoskeletal: She exhibits no edema and no tenderness.  Neurological: She is alert and oriented to person, place, and time.  Skin:  Patient became diaphoretic and nauseous after GI cocktail.  Patient vomiting clear liquids after ingesting GI cocktail,      Lab Results  Component Value Date   WBC 8.4 02/11/2014   HGB 12.5 02/11/2014   HCT 38.2 02/11/2014   MCV 90.7 02/11/2014   PLT 287 02/11/2014   Lab Results  Component  Value Date   CREATININE 0.90 02/11/2014   BUN 13 02/11/2014   NA 139 02/11/2014   K 4.2 02/11/2014   CL 102 02/11/2014   CO2 24 02/11/2014    Lab Results  Component Value Date   HGBA1C 5.7* 12/26/2013   Lipid Panel  No results found for this basename: chol, trig, hdl, cholhdl, vldl, ldlcalc       Assessment and plan:   Karina Murphy was seen today for no specified reason.  Diagnoses and associated orders for this visit:  Nausea with vomiting - ondansetron (ZOFRAN-ODT) disintegrating tablet 4 mg; Take 1 tablet (4 mg total) by mouth once. - metoCLOPramide (REGLAN) 10 MG tablet; Take 1 tablet (10 mg total) by mouth every 8 (eight) hours as needed for nausea.  Abdominal pain, unspecified site-gastroparesis  - Ambulatory referral to Gastroenterology - gi cocktail (Maalox,Lidocaine,Donnatal); Take 30 mLs by mouth once - azithromycin (ZITHROMAX) 250 MG tablet; Take two today, and one each day after - H. pylori antibody, IgA  Rheumatoid arthritis  Return if symptoms worsen or fail to improve.       Holland Commons, NP-C [facility] [phone] 02/25/2014, 3:23 PM

## 2014-02-22 NOTE — Patient Instructions (Signed)
Gastroparesia  (Gastroparesis)   La gastroparesia también se llama vaciado estomacal lento (vaciado gástrico retardado). Es una afección en la que el estómago tarda demasiado tiempo en vaciar sus contenidos. Ocurre con frecuencia en las personas con diabetes.   CAUSAS   La gastroparesia se produce cuando los nervios del estómago están dañados o dejan de funcionar. Cuando los nervios están dañados, los músculos del estómago y los intestinos no trabajan normalmente. El movimiento de los alimentos se hace más lento o se detiene. Niveles altos de glucosa en sangre (azúcar) provocan cambios en los nervios y pueden dañar los vasos sanguíneos que llevan oxígeno y nutrientes a los nervios.   FACTORES DE RIESGO   · Diabetes.  · Síndromes post-virales.  · Trastornos de la alimentación (anorexia, bulimia).  · Cirugía del estómago o del nervio vago.  · Enfermedad por reflujo gastroesofágico (infrecuente).  · Trastornos de los músculos lisos (amiloidosis, esclerodermia).  · Trastornos metabólicos, incluyendo el hipotiroidismo.  · Enfermedad de Parkinson.  SÍNTOMAS   · Acidez.  · Ganas de vomitar (náuseas).  · Vómitos de alimentos no digeridos.  · Sensación molesta de distensión después de comer.  · Pérdida de peso.  · Distensión abdominal.  · Niveles erráticos de glucosa en sangre.  · Pérdida del apetito.  · Reflujo gastroesofágico.  · Espasmos en la pared estomacal.  Las complicaciones pueden ser:   · Desarrollo bacteriano excesivo en el estómago. La comida permanece en el estómago y puede fermentar y hacer que las bacterias se reproduzcan.  · Pérdida de peso debido a la dificultad para digerir y absorber los nutrientes.  · Vómitos.  · Obstrucción en el estómago. La comida no digerida puede endurecerse y provocar náuseas y vómitos.  · Fluctuaciones de la glucosa en sangre causadas por la absorción irregular de los alimentos.  DIAGNÓSTICO   El diagnóstico de gastroparesia se confirma a través de uno o más de los siguientes  estudios:   · Radiografías y exploraciones con bario. En estos estudios se observa cuánto tiempo demora la comida en moverse a través del estómago.  · Manometría gástrica. En este estudio se mide la actividad muscular y eléctrica del estómago. Se pasa un tubo delgado por la garganta hasta el estómago. El tubo contiene un cable que mide la actividad muscular y eléctrica del estómago a medida que digiere los alimentos líquidos y sólidos.  · Endoscopia. Este procedimiento se realiza con un tubo largo y delgado llamado endoscopio. Este tubo se pasa a través de la boca y suavemente hacia abajo por el esófago hasta el estómago. Por medio de este tubo el profesional puede observar el revestimiento del estómago para comprobar si hay anomalías.  · Ecografía. Puede detectar enfermedades de la vesícula biliar o pancreatitis. En este estudio se perfila y define la forma de la vesícula biliar y el páncreas.  TRATAMIENTO   · Los tratamientos pueden incluir:  · La práctica de ejercicios.  · Medicamentos para controlar las náuseas y los vómitos.  · Medicamentos que estimulan los músculos del estómago.  · Cambios en el tipo y el momento de consumir alimentos.  · Tomar comidas más pequeñas con más frecuencia.  · Consumir alimentos ricos en fibra en formas de bajo contenido de fibra, como comer verduras cocidas en vez de crudas.  · Consumir alimentos con bajo contenido de grasa.  · Consumir líquidos, que son más fáciles de digerir.  · En los casos graves, será necesario colocar un tubo de alimentación o alimentación por vía   intravenosa (IV).  Es importante señalar que en la mayoría de los casos, el tratamiento no cura la gastroparesia. Por lo general es una afección de larga duración (crónica). El tratamiento ayuda a controlar la enfermedad subyacente de modo que se sienta lo más saludable y cómodo posible.   Otros Tratamientos   · Un neuroestimulador gástrico ha sido desarrollado para ayudar a las personas con gastroparesia. El  dispositivo, que funciona con batería, se implanta quirúrgicamente. Emite impulsos eléctricos suaves para ayudar a mejorar el vaciamiento del estómago y para controlar las náuseas y los vómitos.  · El uso de la toxina botulínica ha demostrado mejorar el vaciado de estómago mediante la disminución de las contracciones prolongadas del músculo entre el estómago y el intestino delgado (esfínter pilórico). Los beneficios son transitorios.  SOLICITE ATENCIÓN MÉDICA SI:   · Es diabético y tiene problemas para mantener su nivel de glucosa en el rango óptimo.  · Tiene náuseas, vómitos, distensión o rápida sensación de estar lleno al comer.  · Los síntomas no cambian al cambiar la dieta.  Document Released: 12/16/2008 Document Revised: 12/25/2012  [employer]® Patient Information ©2014 [employer].

## 2014-02-22 NOTE — Progress Notes (Signed)
Patient here today with c/o left upper abd pain with mild temp ranging form 99-99.8  Patient states the pain is worse when she eats.  Patient denies any pain or burning with urination.  Patient states when she goes to the bathroom she and wipes she does have a brown color discharge.

## 2014-02-26 ENCOUNTER — Telehealth: Payer: Self-pay | Admitting: Internal Medicine

## 2014-02-26 LAB — H. PYLORI ANTIBODY, IGA: HELICOBACTER PYLORI AB, IGA: 21.6 U/mL — AB (ref <9.0)

## 2014-02-26 NOTE — Telephone Encounter (Signed)
Pt has been taking azithromycin (ZITHROMAX) 250 MG tablet since 02/22/14 and says she has noticed small blister like sores on her tongue towards her throat. Not sure if this is a reaction to medication and/or if its normal. Please f/u with pt.

## 2014-02-26 NOTE — Telephone Encounter (Signed)
Contacted patient with interpeter service on the phone. Patient states she is still having some abdominal pain. Patient states the pain is still the same. Patient states she is drinking more water. Patient states she is eating raw vegetables. Informed patient with this condition patient needs to eat cooked vegetables. Asked patient about her small blister sores on her tongue towards her throat. Patient states she noticed this two days after she started on Zithromax. Patient denies pain from the sores or discharge from sores. Patient states its just uncomfortable. Please advise what patient should do. Annamaria Helling, RN

## 2014-02-26 NOTE — Telephone Encounter (Signed)
Message copied by Elson Ulbrich, Arman Filter on Tue Feb 26, 2014 11:41 AM ------      Message from: Holland Commons A      Created: Mon Feb 25, 2014  9:01 PM      Regarding: call pt       Please call patient and f/u with her. Find out if she is still having abdominal pain and if she is avoiding offensive foods for gastroparesis. Thanks ------

## 2014-02-26 NOTE — Telephone Encounter (Signed)
Patient may discontinue medication use, Find out if she is itchy or any other symptoms.  She may take 25mg  of benadryl if it sounds like a allergic reaction.

## 2014-02-27 ENCOUNTER — Encounter: Payer: Self-pay | Admitting: Internal Medicine

## 2014-02-27 ENCOUNTER — Ambulatory Visit: Payer: No Typology Code available for payment source | Attending: Internal Medicine | Admitting: Internal Medicine

## 2014-02-27 VITALS — BP 126/73 | HR 88 | Temp 98.8°F | Resp 14 | Wt 227.2 lb

## 2014-02-27 DIAGNOSIS — R112 Nausea with vomiting, unspecified: Secondary | ICD-10-CM

## 2014-02-27 DIAGNOSIS — F411 Generalized anxiety disorder: Secondary | ICD-10-CM | POA: Insufficient documentation

## 2014-02-27 DIAGNOSIS — I10 Essential (primary) hypertension: Secondary | ICD-10-CM | POA: Insufficient documentation

## 2014-02-27 DIAGNOSIS — E860 Dehydration: Secondary | ICD-10-CM

## 2014-02-27 MED ORDER — SODIUM CHLORIDE 0.9 % IV SOLN
Freq: Once | INTRAVENOUS | Status: AC
  Administered 2014-02-27: 1000 mL via INTRAVENOUS

## 2014-02-27 MED ORDER — PROMETHAZINE HCL 25 MG/ML IJ SOLN
25.0000 mg | Freq: Once | INTRAMUSCULAR | Status: AC
  Administered 2014-02-27: 25 mg via INTRAMUSCULAR

## 2014-02-27 MED ORDER — LORAZEPAM 2 MG/ML IJ SOLN
1.0000 mg | Freq: Once | INTRAMUSCULAR | Status: AC
  Administered 2014-02-27: 1 mg via INTRAVENOUS

## 2014-02-27 NOTE — Progress Notes (Signed)
Patient ID: Karina Murphy, female   DOB: 14-Apr-1981, 33 y.o.   MRN: [id number]  CC: Nausea and Vomiting  HPI:  Patient presents to clinic for c/o of constant nausea and vomiting for past two days.  Patient reports that she has been unable to keep food and water down.  She reports that she has not really ate or drank in the past two days.  Patient states that she is having abdominal pain and burning at the moment.  Patient states that her last episode of emesis was 10 minutes prior to this visit.  Patient dehydrated on presentation. Interpreter line was used to communicate with the patient.  After explaining to patient that she will get an IV she started having tremors.  Patient was very anxious and crying at the time.  Patient reported that she did not want to go to the ER at this time, she wanted to be treated here and go from there.  Patient given 1L of normal saline, 25 mg of IV promethazine, and 1 mg of Ativan.  Patient reported that she feel better and would like to go home.  Patient vitals remained stable during events.  Patient vitals upon discharge are 117/78, HR of 88.    No Known Allergies Past Medical History  Diagnosis Date  . Hypertension    Current Outpatient Prescriptions on File Prior to Visit  Medication Sig Dispense Refill  . amoxicillin (AMOXIL) 500 MG tablet Take 500 mg by mouth 3 (three) times daily.      Marland Kitchen azithromycin (ZITHROMAX) 250 MG tablet Take two today, and one each day after  6 tablet  0  . ergocalciferol (DRISDOL) 50000 UNITS capsule Take 1 capsule (50,000 Units total) by mouth once a week.  10 capsule  0  . famotidine (PEPCID) 20 MG tablet Take 1 tablet (20 mg total) by mouth 2 (two) times daily.  30 tablet  0  . lisinopril (PRINIVIL,ZESTRIL) 5 MG tablet Take 1 tablet (5 mg total) by mouth daily.  30 tablet  1  . meloxicam (MOBIC) 15 MG tablet Take 1 tablet (15 mg total) by mouth daily.  30 tablet  0  . metoCLOPramide (REGLAN) 10 MG tablet Take 1 tablet (10  mg total) by mouth every 8 (eight) hours as needed for nausea.  40 tablet  1  . metroNIDAZOLE (FLAGYL) 500 MG tablet Take 500 mg by mouth.      . metroNIDAZOLE (FLAGYL) 500 MG tablet Take 1 tablet (500 mg total) by mouth 2 (two) times daily.  14 tablet  0  . pantoprazole (PROTONIX) 40 MG tablet Take 1 tablet (40 mg total) by mouth 2 (two) times daily.  60 tablet  3  . predniSONE (DELTASONE) 20 MG tablet Take 1 tablet (20 mg total) by mouth as directed. Take 60 mg every 6 hours for 3 days, 40 mg every six hours for 3 days, 20 mg every 6 hours for 3 days, 20 mg daily  100 tablet  0  . sucralfate (CARAFATE) 1 G tablet Take 1 g by mouth 4 (four) times daily -  with meals and at bedtime.      . traMADol (ULTRAM) 50 MG tablet Take 1 tablet (50 mg total) by mouth every 8 (eight) hours as needed.  30 tablet  0  . Vitamin D, Ergocalciferol, (DRISDOL) 50000 UNITS CAPS capsule Take 1 capsule (50,000 Units total) by mouth every 7 (seven) days.  12 capsule  0   No current facility-administered medications on  file prior to visit.   No family history on file. History   Social History  . Marital Status: Single    Spouse Name: N/A    Number of Children: N/A  . Years of Education: N/A   Occupational History  . Not on file.   Social History Main Topics  . Smoking status: Never Smoker   . Smokeless tobacco: Never Used  . Alcohol Use: No  . Drug Use: No  . Sexual Activity: Yes    Birth Control/ Protection: Condom   Other Topics Concern  . Not on file   Social History Narrative  . No narrative on file    Review of Systems: See HPI  Objective:  There were no vitals filed for this visit. Physical Exam  Constitutional: She is oriented to person, place, and time.  HENT:  Dry oral mucosa, red eyes  Neck: Normal range of motion. Neck supple.  Cardiovascular: Regular rhythm and normal heart sounds.   tachycardic  Pulmonary/Chest: Effort normal and breath sounds normal.  Abdominal: Soft. Bowel  sounds are normal. She exhibits no distension.  Musculoskeletal:  Tremors   Neurological: She is oriented to person, place, and time.  Skin: She is diaphoretic.  Diaphoretic    Psychiatric:  anxious     Lab Results  Component Value Date   WBC 8.4 02/11/2014   HGB 12.5 02/11/2014   HCT 38.2 02/11/2014   MCV 90.7 02/11/2014   PLT 287 02/11/2014   Lab Results  Component Value Date   CREATININE 0.90 02/11/2014   BUN 13 02/11/2014   NA 139 02/11/2014   K 4.2 02/11/2014   CL 102 02/11/2014   CO2 24 02/11/2014    Lab Results  Component Value Date   HGBA1C 5.7* 12/26/2013   Lipid Panel  No results found for this basename: chol, trig, hdl, cholhdl, vldl, ldlcalc       Assessment and plan:   Libni was seen today for emesis.  Diagnoses and associated orders for this visit:  Dehydration - 0.9 %  sodium chloride infusion; Inject into the vein once. - Amylase - Lipase - Basic Metabolic Panel Patient educated to stay hydrated  Nausea with vomiting - promethazine (PHENERGAN) injection 25 mg; Inject 1 mL (25 mg total) into the muscle once. Patient should take Reglan every 8 hours and return to clinic tomorrow.  Anxiety state, unspecified - LORazepam (ATIVAN) injection 1 mg; Inject 0.5 mLs (1 mg total) into the vein once.  Return in about 1 day (around 02/28/2014) for Dehydration, Nausea, Vomiting.      Holland Commons, NP-C [facility] [phone] 02/27/2014, 6:22 PM

## 2014-02-27 NOTE — Telephone Encounter (Signed)
Informed patient to stop the medication. Patient states she is not itching. Patient states it is difficult to swallow and hard to breath sometimes. Informed patient to come here so we can assess and treat her. Patient verbalized understanding and stated she can't come until 3pm or 4pm. Patient states she has been vomiting all night. Informed patient she will need someone to ride with her because she may require medications that make her sleepy to treat for nausea and vomiting. Patient verbalized understanding. Annamaria Helling, RN

## 2014-02-27 NOTE — Progress Notes (Signed)
Patient here for vomiting for 2 days.

## 2014-02-27 NOTE — Patient Instructions (Signed)
Gastritis - Adultos  °(Gastritis, Adult) ° La gastrittis es la irritación (inflamación) de la membrana interna del estómago. Puede ser una enfermedad de inicio súbito (aguda) o de largo plazo (crónica). Si la gastritis no se trata, puede causar sangrado y úlceras. °CAUSAS  °La gastritis se produce cuando la membrana que tapiza interiormente al estómago se debilita o se daña. Los jugos digestivos del estómago inflaman el revestimiento del estómago debilitado. El revestimiento del estómago puede debilitarse o dañarse por una infección viral o bacteriana. La infección bacteriana más común es la infección por Helicobacter pylori. También puede ser el resultado del consumo excesivo de alcohol, por el uso de ciertos medicamentos o porque hay demasiado ácido en el estómago.  °SÍNTOMAS  °En algunos casos no hay síntomas. Si se presentan síntomas, éstos pueden ser:  °· Dolor o sensación de ardor en la parte superior del abdomen. °· Náuseas. °· Vómitos. °· Sensación molesta de distensión después de comer. °DIAGNÓSTICO  °El médico puede diagnosticar gastritis según los síntomas y el examen físico. Para determinar la causa de la gastritis, el médico podrá:  °· Pedir análisis de sangre o de materia fecal para diagnosticar la presencia de la bacteria H pylori. °· Gastroscopía. Un tubo delgado y flexible (endoscopio) se pasa por el esófago hasta llegar al estómago. El endoscopio tiene una luz y una cámara en el extremo. El médico utilizará el endoscopio para observar el interior del estómago. °· Tomará una muestra de tejido (biopsia) del estómago para examinarlo en el microscopio. °TRATAMIENTO  °Según la causa de la gastritis podrán recetarle: Antibióticos, si la causa es una infección bacteriana, como una infección por H. pylori. Antiácidos o bloqueadores H2, si hay demasiado ácido en el estómago. El médico le aconsejará que deje de tomar aspirina, ibuprofeno u otros antiinflamatorios no esteroides (AINE).  °INSTRUCCIONES PARA EL  CUIDADO EN EL HOGAR  °· Tome sólo medicamentos de venta libre o recetados, según las indicaciones del médico. °· Si le han recetado antibióticos, tómelos según las indicaciones. Tómelos todos, aunque se sienta mejor. °· Debe ingerir gran cantidad de líquido para mantener la orina de tono claro o color amarillo pálido. °· Evite las comidas y bebidas que empeoran los problemas, como: °¨ Bebidas con cafeína o alcohólicas. °¨ Chocolate. °¨ Sabores a menta. °¨ Ajo y cebolla. °¨ Comidas muy condimentadas. °¨ Cítricos como naranjas, limones o limas. °¨ Alimentos que contengan tomate, como salsas, chile y pizza. °¨ Alimentos fritos y grasos. °· Haga comidas pequeñas durante el día en lugar de 3 comidas abundantes. °SOLICITE ATENCIÓN MÉDICA DE INMEDIATO SI:  °· La materia fecal es negra o de color rojo oscuro. °· Vomita sangre de color rojo brillante o material similar a granos de café. °· No puede retener los líquidos. °· El dolor abdominal empeora. °· Tiene fiebre. °· No mejora luego de 1 semana. °· Tiene preguntas o preocupaciones. °ASEGÚRESE DE QUE:  °· Comprende estas instrucciones. °· Controlará su enfermedad. °· Solicitará ayuda de inmediato si no mejora o si empeora. °Document Released: 06/09/2005 Document Revised: 05/24/2012 °ExitCare® Patient Information ©2015 [employer]. This information is not intended to replace advice given to you by your health care provider. Make sure you discuss any questions you have with your health care provider. ° °

## 2014-02-28 ENCOUNTER — Telehealth: Payer: Self-pay | Admitting: Emergency Medicine

## 2014-02-28 ENCOUNTER — Encounter: Payer: Self-pay | Admitting: Emergency Medicine

## 2014-02-28 ENCOUNTER — Other Ambulatory Visit: Payer: Self-pay | Admitting: Emergency Medicine

## 2014-02-28 LAB — BASIC METABOLIC PANEL
BUN: 12 mg/dL (ref 6–23)
CHLORIDE: 102 meq/L (ref 96–112)
CO2: 22 mEq/L (ref 19–32)
Calcium: 9.7 mg/dL (ref 8.4–10.5)
Creat: 0.75 mg/dL (ref 0.50–1.10)
GLUCOSE: 83 mg/dL (ref 70–99)
POTASSIUM: 3.7 meq/L (ref 3.5–5.3)
SODIUM: 140 meq/L (ref 135–145)

## 2014-02-28 LAB — AMYLASE: AMYLASE: 36 U/L (ref 0–105)

## 2014-02-28 LAB — LIPASE: Lipase: <10 U/L (ref 0–75)

## 2014-02-28 MED ORDER — LORAZEPAM 0.5 MG PO TABS: 0.5000 mg | ORAL_TABLET | Freq: Two times a day (BID) | ORAL | Status: DC | PRN

## 2014-02-28 MED ORDER — ESCITALOPRAM OXALATE 20 MG PO TABS: 20.0000 mg | ORAL_TABLET | Freq: Every day | ORAL | Status: DC

## 2014-02-28 NOTE — Progress Notes (Signed)
Patient ID: Karina Murphy, female   DOB: 01-Aug-1981, 33 y.o.   MRN: [id number] Pt comes in today for blood recheck s/p dehydration, n,v,chills Pt states she is feeling much better per [facility] intepretor line Orthostats negative

## 2014-02-28 NOTE — Patient Instructions (Signed)
Start taking prescribed medication Lexapro 10 mg tab daily Take prescribed Ativan 0.5 mg tab twice a day. Take in the morning to see how it makes you feel then take as needed Return in 6 weeks with Steve Gregg and resume taking Reglan to settle stomach until scheduled appointment with Gastroenterologist Please call clinic if symptoms worsens with nausea,vomitting or diarrhea

## 2014-03-04 ENCOUNTER — Other Ambulatory Visit (HOSPITAL_COMMUNITY): Payer: Self-pay | Admitting: Gastroenterology

## 2014-03-04 DIAGNOSIS — R109 Unspecified abdominal pain: Secondary | ICD-10-CM

## 2014-03-06 ENCOUNTER — Telehealth: Payer: Self-pay | Admitting: Internal Medicine

## 2014-03-06 NOTE — Telephone Encounter (Signed)
Pt. Is experiencing hand pain, she would like to know if she can be prescribed Prednisone.Marland KitchenMarland KitchenMarland KitchenPlease call patient with spanish interpreter.

## 2014-03-11 ENCOUNTER — Other Ambulatory Visit: Payer: Self-pay | Admitting: Internal Medicine

## 2014-03-11 DIAGNOSIS — M069 Rheumatoid arthritis, unspecified: Secondary | ICD-10-CM

## 2014-03-11 NOTE — Telephone Encounter (Signed)
Pt. Requests refill for predniSONE (DELTASONE) 20 MG tablet Please F/U with Pt.

## 2014-03-19 NOTE — Telephone Encounter (Signed)
Pt. Has left 3 messages in regards to getting a refill on Prednisone... This medication works better for patient....pleasem call patient

## 2014-03-20 ENCOUNTER — Telehealth: Payer: Self-pay | Admitting: *Deleted

## 2014-03-20 DIAGNOSIS — M7989 Other specified soft tissue disorders: Secondary | ICD-10-CM

## 2014-03-20 MED ORDER — PREDNISONE 20 MG PO TABS: 20.0000 mg | ORAL_TABLET | Freq: Every day | ORAL | Status: DC

## 2014-03-20 NOTE — Telephone Encounter (Signed)
Message copied by Fredderick Severance on Wed Mar 20, 2014  4:42 PM ------      Message from: Holland Commons A      Created: Tue Mar 19, 2014  7:03 PM       Please call patient asap and find out if she is just having pain in her hands or if she is having swelling as well.  If she is having pain find out if she is still taking the mobic.  If swelling may sent patient prednisone 20 mg daily for 5 days only.  Find out when she is scheduled to see the rheumatologist. Thanks ------

## 2014-03-20 NOTE — Telephone Encounter (Signed)
Patient contacted via [facility] Interpreter Patient states she has an appointment scheduled with rheumatologist on 03/28/14 at 8 AM. States she has pain and swelling in both hands and left elbow for 2 weeks States she is taking gabapentin q HS States she does not have mobic and has no relief with tramadol  Order for Prednisone placed per Provider. Patient states she will pick it up tomorrow AM

## 2014-03-22 ENCOUNTER — Ambulatory Visit (HOSPITAL_COMMUNITY)
Admission: RE | Admit: 2014-03-22 | Discharge: 2014-03-22 | Disposition: A | Discharge status: Home / Self Care | Payer: No Typology Code available for payment source | Source: Ambulatory Visit | Attending: Gastroenterology | Admitting: Gastroenterology

## 2014-03-22 DIAGNOSIS — R109 Unspecified abdominal pain: Secondary | ICD-10-CM | POA: Insufficient documentation

## 2014-03-22 MED ORDER — TECHNETIUM TC 99M MEBROFENIN IV KIT
5.5000 | PACK | Freq: Once | INTRAVENOUS | Status: AC | PRN
  Administered 2014-03-22: 6 via INTRAVENOUS

## 2014-03-27 ENCOUNTER — Ambulatory Visit: Payer: Self-pay | Admitting: Internal Medicine

## 2014-03-28 ENCOUNTER — Telehealth: Payer: Self-pay | Admitting: Internal Medicine

## 2014-03-28 NOTE — Telephone Encounter (Signed)
Pt dropped off copy of orders signed by Dr. Tresa Garter for x-ray.  Because pt has [facility] orange card pt needs x-ray to be ordered by PCP. Please f/u with x-ray appt info.

## 2014-03-28 NOTE — Telephone Encounter (Addendum)
Pt went to rheumatology referral with Dr. Garnette Czech, [facility]. During visit Dr said he would refer pt to have x-ray.  Pt calling to ask whether her PCP, has received forms to be filled out.

## 2014-03-29 ENCOUNTER — Other Ambulatory Visit: Payer: Self-pay | Admitting: Emergency Medicine

## 2014-03-29 DIAGNOSIS — M069 Rheumatoid arthritis, unspecified: Secondary | ICD-10-CM

## 2014-04-01 ENCOUNTER — Telehealth: Payer: Self-pay | Admitting: Emergency Medicine

## 2014-04-01 ENCOUNTER — Ambulatory Visit (HOSPITAL_COMMUNITY)
Admission: RE | Admit: 2014-04-01 | Discharge: 2014-04-01 | Disposition: A | Discharge status: Home / Self Care | Payer: No Typology Code available for payment source | Source: Ambulatory Visit | Attending: Internal Medicine | Admitting: Internal Medicine

## 2014-04-01 ENCOUNTER — Other Ambulatory Visit (HOSPITAL_COMMUNITY): Payer: Self-pay | Admitting: Internal Medicine

## 2014-04-01 DIAGNOSIS — M7730 Calcaneal spur, unspecified foot: Secondary | ICD-10-CM | POA: Insufficient documentation

## 2014-04-01 DIAGNOSIS — M069 Rheumatoid arthritis, unspecified: Secondary | ICD-10-CM | POA: Insufficient documentation

## 2014-04-01 NOTE — Telephone Encounter (Signed)
Pt given ordered bilat hand order @ [facility] hospital 1st foor Radiology via [facility] interpretor

## 2014-04-04 ENCOUNTER — Emergency Department (HOSPITAL_COMMUNITY)
Admission: EM | Admit: 2014-04-04 | Discharge: 2014-04-04 | Disposition: A | Discharge status: Home / Self Care | Payer: Self-pay | Attending: Emergency Medicine | Admitting: Emergency Medicine

## 2014-04-04 ENCOUNTER — Emergency Department (HOSPITAL_COMMUNITY): Payer: No Typology Code available for payment source

## 2014-04-04 ENCOUNTER — Emergency Department (HOSPITAL_COMMUNITY): Payer: Self-pay

## 2014-04-04 ENCOUNTER — Encounter (HOSPITAL_COMMUNITY): Payer: Self-pay | Admitting: Emergency Medicine

## 2014-04-04 DIAGNOSIS — R079 Chest pain, unspecified: Secondary | ICD-10-CM | POA: Insufficient documentation

## 2014-04-04 DIAGNOSIS — IMO0002 Reserved for concepts with insufficient information to code with codable children: Secondary | ICD-10-CM | POA: Insufficient documentation

## 2014-04-04 DIAGNOSIS — Z792 Long term (current) use of antibiotics: Secondary | ICD-10-CM | POA: Insufficient documentation

## 2014-04-04 DIAGNOSIS — I10 Essential (primary) hypertension: Secondary | ICD-10-CM | POA: Insufficient documentation

## 2014-04-04 DIAGNOSIS — Z79899 Other long term (current) drug therapy: Secondary | ICD-10-CM | POA: Insufficient documentation

## 2014-04-04 DIAGNOSIS — R6883 Chills (without fever): Secondary | ICD-10-CM | POA: Insufficient documentation

## 2014-04-04 DIAGNOSIS — R000 Tachycardia, unspecified: Secondary | ICD-10-CM | POA: Insufficient documentation

## 2014-04-04 DIAGNOSIS — M129 Arthropathy, unspecified: Secondary | ICD-10-CM | POA: Insufficient documentation

## 2014-04-04 DIAGNOSIS — M069 Rheumatoid arthritis, unspecified: Secondary | ICD-10-CM | POA: Insufficient documentation

## 2014-04-04 LAB — I-STAT CHEM 8, ED
BUN: 12 mg/dL (ref 6–23)
Calcium, Ion: 1.2 mmol/L (ref 1.12–1.23)
Chloride: 102 mEq/L (ref 96–112)
Creatinine, Ser: 0.8 mg/dL (ref 0.50–1.10)
Glucose, Bld: 100 mg/dL — ABNORMAL HIGH (ref 70–99)
HEMATOCRIT: 44 % (ref 36.0–46.0)
HEMOGLOBIN: 15 g/dL (ref 12.0–15.0)
POTASSIUM: 3.4 meq/L — AB (ref 3.7–5.3)
Sodium: 140 mEq/L (ref 137–147)
TCO2: 23 mmol/L (ref 0–100)

## 2014-04-04 LAB — POC URINE PREG, ED: Preg Test, Ur: NEGATIVE

## 2014-04-04 LAB — D-DIMER, QUANTITATIVE (NOT AT ARMC): D DIMER QUANT: 2.92 ug{FEU}/mL — AB (ref 0.00–0.48)

## 2014-04-04 MED ORDER — OXYCODONE-ACETAMINOPHEN 5-325 MG PO TABS
1.0000 | ORAL_TABLET | Freq: Once | ORAL | Status: AC
  Administered 2014-04-04: 1 via ORAL
  Filled 2014-04-04: qty 1

## 2014-04-04 MED ORDER — IOHEXOL 350 MG/ML SOLN
80.0000 mL | Freq: Once | INTRAVENOUS | Status: AC | PRN
  Administered 2014-04-04: 80 mL via INTRAVENOUS

## 2014-04-04 MED ORDER — OXYCODONE-ACETAMINOPHEN 5-325 MG PO TABS: 2.0000 | ORAL_TABLET | ORAL | Status: DC | PRN

## 2014-04-04 NOTE — Discharge Instructions (Signed)
Artritis inespecífica °(Arthritis, Nonspecific) °La artritis es la inflamación de una articulación. Los síntomas son dolor, enrojecimiento, calor o hinchazón. Pueden verse involucradas una o más articulaciones. Hay diferentes tipos de artritis. El médico no podrá diagnosticar inmediatamente cuál es el tipo de artritis que usted sufre.  °CAUSAS °La causa más frecuente es el desgaste de la articulación (osteoartritis). Esto ocasiona lesiones en el cartílago, que puede romperse con el tiempo. Las zonas más afectadas por este tipo de artritis son las rodillas, caderas, espalda y cuello. °Otros tipos de artritis y causas frecuentes de dolor en la articulación son: °· Esguinces y otras lesiones cercanas a la articulación}. En algunos casos, esguinces y lesiones menores causan dolor e hinchazón que aparece horas más tarde. °· Artritis reumatoidea Afecta las manos, pies y rodillas. Generalmente afecta ambos lados del cuerpo al mismo tiempo. Generalmente se asocia a enfermedades crónicas, fiebre, pérdida de peso y debilidad general. °· Artritis por cristales. La gota y la pseudogota pueden causar dolor intenso agudo ocasional, enrojecimiento e hinchazón del pie, el tobillo o la rodilla. °· Artritis infecciosa. Las bacterias pueden penetrar en la articulación a través de una herida en la piel. Esto puede causar una infección en la articulación. Las bacterias y virus también pueden diseminarse a través del torrente sanguíneo y afectar las articulaciones. °· Reacciones a medicamentos, infecciosas y alérgicas. En algunos casos las articulaciones duelen levemente y están ligeramente hinchadas en este tipo de enfermedad. °SÍNTOMAS °· El dolor es el síntoma principal. °· La articulación también pueden verse roja, hinchada y caliente al tacto. °· En ciertos tipos de artritis hay fiebre o malestar general. °· En la articulación que presenta artritis sentirá dolor con el movimiento. En otros tipos de artritis hay  rigidez. °DIAGNÓSTICO: °El médico sospechará artritis basándose en la descripción de los síntomas y en el examen. Será necesario realizar pruebas para diagnosticar el tipo de artritis. °· Análisis de sangre y en algunos casos de orina. °· Radiografías y en algunos casos tomografía computada o diagnóstico por imágenes. °· La remoción del líquido de la articulación (artrocentesis) se realiza para controlar la presencia de bacterias, cristales o por otras causas. Su médico (o un especialista) adormecerán la zona de la articulación con un anestésico local y utilizarán una aguja para retirar líquido de la articulación para ser examinado. Este procedimiento es sólo mínimamente molesto. °· Aún con estas pruebas, el médico no podrá decir qué tipo de artritis usted sufre. La consulta con un especialista (reumatólogo) puede ser de utilidad. °TRATAMIENTO °El médico comentará con usted el tratamiento específico para su tipo de artritis. Si el tipo específico no puede determinarse, podrán aplicarse las siguientes recomendaciones generales.  °El tratamiento para el dolor intenso de las articulaciones consiste en: °· Hacer reposo °· Elevar el miembro. °· Podrán prescribirle medicamentos antiinflamatorios (como ibuprofeno). Evite las actividades que aumenten el dolor. °· Sólo tome medicamentos de venta libre o prescriptos para calmar el dolor y las molestias, según las indicaciones de su médico. °· Puede aplicarse compresas frías sobre la articulación dolorida durante 10 a 15 minutos cada hora. Las compresas calientes también pueden ser beneficiosas, pero no las utilice durante la noche. No use compresas calientes sin autorización de su médico, si es diabético. °· Una inyección de corticoides en la articulación artrítica puede ayudar a reducir el dolor y la hinchazón. °· Si una artritis aguda empeora en los siguientes 1 ó 2 días, será necesario descartar una infección. °El tratamiento prolongado implica la modificación de  actividades y del estilo   de vida para reducir el estrés en la articulación. Puede ser necesario que baje de peso. La actividad física es necesaria para nutrir el cartílago de la articulación y eliminar los desechos. Esto ayuda a mantener fuertes los músculos que rodean la articulación. °INSTRUCCIONES PARA EL CUIDADO DOMICILIARIO °· No tome aspirina para aliviar el dolor si se sospecha que sufre gota. Esto eleva los niveles de ácido úrico. °· Solo tome medicamentos que se pueden comprar sin receta o recetados para el dolor, malestar o fiebre, como le indica el médico. °· Haga reposo todo el tiempo que pueda. °· Si la articulación está hinchada, manténgala elevada. °· Utilice muletas si la articulación que le duele está en la pierna. °· Beber abundante cantidad de líquidos será beneficioso para ciertos tipos de artritis. °· Siga las indicaciones del profesional. °· La actividad física regular puede ser beneficiosa, incluyendo las actividades de bajo impacto como: °¨ Natación. °¨ Aquagym. °¨ Andar en bicicleta. °¨ Caminar. °· La rigidez matutina se alivia con una ducha caliente. °· También es beneficioso que realice ejercicios de amplitud de movimiento. °SOLICITE ATENCIÓN MÉDICA SI: °· No se siente mejor o empeora luego de las 24 horas. °· Presenta efectos adversos por los medicamentos y no mejora con el tratamiento. °SOLICITE ATENCIÓN MÉDICA INMEDIATAMENTE SI: °· Tiene fiebre. °· Presenta fiebre o dolor intenso, hinchazón o enrojecimiento. °· Muchas articulaciones están involucradas y están hinchadas y siente dolor. °· Tiene un dolor intenso en la espalda o siente debilidad en las piernas. °· Pierde el control de la vejiga o del intestino. °Document Released: 08/30/2005 Document Revised: 11/22/2011 °ExitCare® Patient Information ©2015 [employer]. This information is not intended to replace advice given to you by your health care provider. Make sure you discuss any questions you have with your health care  provider. ° °

## 2014-04-04 NOTE — ED Notes (Signed)
Pt in c/o arthritis pain to left wrist and left knee since earlier today

## 2014-04-04 NOTE — ED Provider Notes (Signed)
CSN: [id number]     Arrival date & time 04/04/14  1751 History  This chart was scribed for non-physician practitioner, Fayrene Helper, PA-C, working with Vanetta Mulders, MD, by Bronson Curb, ED Scribe. This patient was seen in room TR11C/TR11C and the patient's care was started at 6:32 PM.      Chief Complaint  Patient presents with  . Arthritis      The history is provided by the patient. A language interpreter was used.    HPI Comments: Karina Murphy is a 33 y.o. female, with history of HTN, who presents to the Emergency Department complaining of intermittent pain in the left wrist and left knee pain that began this morning. Patient reports history of similar pain. She reports she first experience this pain in her feet, and was given Prednisone with improvement. She states the Prednisone is now ineffective. She reports she has taken Tramadol today at 11:30 AM without improvement. She reports she experiences this pain everyday, and intermittently for the entire day. This has been ongoing for 2 months. There is associated chills, chest pain (pressure) when breathing, and nausea. She states her "hand is very cold" and she feels "very warm in her face". She denies cough or SOB when walking. Denies hemoptysis.  She states she was seen 3 days ago for the same and X-rays were preformed. She states she called the facility where she was evaluated, and was informed to keep taking Prednisone and to come to the ED if she continues to experience pain. Patient is right hand dominant. Patient is not taking birth control pills. She denies history PE/DVT, CAD, or MI. Pt is Spanish Speaking, hx obtain through [facility] phone interpreter.     Past Medical History  Diagnosis Date  . Hypertension    History reviewed. No pertinent past surgical history. History reviewed. No pertinent family history. History  Substance Use Topics  . Smoking status: Never Smoker   . Smokeless tobacco: Never Used  .  Alcohol Use: No   OB History   Grav Para Term Preterm Abortions TAB SAB Ect Mult Living                 Review of Systems  Constitutional: Positive for chills.  Cardiovascular: Positive for chest pain.  Musculoskeletal: Positive for arthralgias (left wrist and left knee).  All other systems reviewed and are negative.     Allergies  Review of patient's allergies indicates no known allergies.  Home Medications   Prior to Admission medications   Medication Sig Start Date End Date Taking? Authorizing Provider  amoxicillin (AMOXIL) 500 MG tablet Take 500 mg by mouth 3 (three) times daily.    Historical Provider, MD  azithromycin (ZITHROMAX) 250 MG tablet Take two today, and one each day after 02/22/14   Ambrose Finland, NP  ergocalciferol (DRISDOL) 50000 UNITS capsule Take 1 capsule (50,000 Units total) by mouth once a week. 12/27/13   Ambrose Finland, NP  famotidine (PEPCID) 20 MG tablet Take 1 tablet (20 mg total) by mouth 2 (two) times daily. 11/03/13   Sunnie Nielsen, MD  lisinopril (PRINIVIL,ZESTRIL) 5 MG tablet Take 1 tablet (5 mg total) by mouth daily. 08/06/13   Kristen N Ward, DO  LORazepam (ATIVAN) 0.5 MG tablet Take 1 tablet (0.5 mg total) by mouth 2 (two) times daily as needed for anxiety. 02/28/14   Ambrose Finland, NP  meloxicam (MOBIC) 15 MG tablet Take 1 tablet (15 mg total) by mouth daily. 02/12/14  Phill Mutter Dammen, PA-C  metoCLOPramide (REGLAN) 10 MG tablet Take 1 tablet (10 mg total) by mouth every 8 (eight) hours as needed for nausea. 02/22/14   Ambrose Finland, NP  metroNIDAZOLE (FLAGYL) 500 MG tablet Take 500 mg by mouth.    Historical Provider, MD  metroNIDAZOLE (FLAGYL) 500 MG tablet Take 1 tablet (500 mg total) by mouth 2 (two) times daily. 02/12/14   Phill Mutter Dammen, PA-C  pantoprazole (PROTONIX) 40 MG tablet Take 1 tablet (40 mg total) by mouth 2 (two) times daily. 12/26/13   Ambrose Finland, NP  predniSONE (DELTASONE) 20 MG tablet Take 1 tablet (20 mg total) by mouth daily with  breakfast. 03/20/14   Ambrose Finland, NP  sucralfate (CARAFATE) 1 G tablet Take 1 g by mouth 4 (four) times daily -  with meals and at bedtime.    Historical Provider, MD  traMADol (ULTRAM) 50 MG tablet Take 1 tablet (50 mg total) by mouth every 8 (eight) hours as needed. 12/26/13   Ambrose Finland, NP  Vitamin D, Ergocalciferol, (DRISDOL) 50000 UNITS CAPS capsule Take 1 capsule (50,000 Units total) by mouth every 7 (seven) days. 12/28/13   Ambrose Finland, NP   Triage Vitals: BP 151/84  Pulse 92  Temp(Src) 98 F (36.7 C) (Oral)  Resp 20  SpO2 100%  Physical Exam  Nursing note and vitals reviewed. Constitutional: She is oriented to person, place, and time. She appears well-developed and well-nourished. No distress.  HENT:  Head: Normocephalic and atraumatic.  Eyes: Conjunctivae and EOM are normal.  Neck: Neck supple. No tracheal deviation present.  Cardiovascular: Intact distal pulses.  Tachycardia present.  Exam reveals no gallop and no friction rub.   No murmur heard. 2+ distal pulses  Pulmonary/Chest: Effort normal and breath sounds normal. No respiratory distress. She has no wheezes. She has no rales.  Abdominal: Soft. There is no tenderness.  Musculoskeletal: Normal range of motion.  Left wrist with generalized tender throughout, however, with FROM. No wrist deformity or signs of infections. L hand with normal grip and nontender.    BLE no  palpable cords, erythema, or edema. TTP of left calf with intact dorsalis pedis bilaterally.  Neurological: She is alert and oriented to person, place, and time.  Skin: Skin is warm and dry.  Psychiatric: She has a normal mood and affect. Her behavior is normal.    ED Course  Procedures (including critical care time)  DIAGNOSTIC STUDIES: Oxygen Saturation is 100% on room air, normal by my interpretation.    COORDINATION OF CARE: At 1846 Discussed treatment plan with patient which includes. Patient made aware we are trying to rule out PE/DVT.  Patient also advised to follow up with PCP. Patient agrees.   10:03 PM Pt with vague complaints, but what's concerning is pleuritic chest pain, L calf tenderness, and pt was tachcyardic on exam.  She is without significant risk factors for ACS or PE/DVT.  D-dimer obtained and was elevated.  CXR neg.  Chest CTA showing no acute finding, specifically no PE.  No signs of infection noted.  Pt does have a pcp which she agrees to f/u.  Will provide pain medication as needed.    Labs Review Labs Reviewed  D-DIMER, QUANTITATIVE - Abnormal; Notable for the following:    D-Dimer, Quant 2.92 (*)    All other components within normal limits  I-STAT CHEM 8, ED - Abnormal; Notable for the following:    Potassium 3.4 (*)  Glucose, Bld 100 (*)    All other components within normal limits  POC URINE PREG, ED    Imaging Review Dg Chest 2 View  04/04/2014   CLINICAL DATA:  cp, pleuritic  EXAM: CHEST  2 VIEW  COMPARISON:  Prior radiograph from 11/02/2013  FINDINGS: The cardiac and mediastinal silhouettes are stable in size and contour, and remain within normal limits.  The lungs are normally inflated. No airspace consolidation, pleural effusion, or pulmonary edema is identified. There is no pneumothorax.  No acute osseous abnormality identified.  IMPRESSION: No acute cardiopulmonary abnormality.   Electronically Signed   By: Rise Mu M.D.   On: 04/04/2014 20:17   Ct Angio Chest Pe W/cm &/or Wo Cm  04/04/2014   CLINICAL DATA:  Chest pressure with nausea and chills. Recent prednisone therapy.  EXAM: CT ANGIOGRAPHY CHEST WITH CONTRAST  TECHNIQUE: Multidetector CT imaging of the chest was performed using the standard protocol during bolus administration of intravenous contrast. Multiplanar CT image reconstructions and MIPs were obtained to evaluate the vascular anatomy.  CONTRAST:  75mL OMNIPAQUE IOHEXOL 350 MG/ML SOLN  COMPARISON:  Radiographs today.  CT 03/08/2012.  FINDINGS: Vascular: The pulmonary  arteries are satisfactorily opacified with contrast. There is no evidence of acute pulmonary embolism. No significant atherosclerosis is demonstrated.  Mediastinum: There are no enlarged mediastinal, hilar or axillary lymph nodes. The thyroid gland and trachea appear normal. There is a small hiatal hernia. The heart size is normal.  Lungs/Pleura: There is no pleural or pericardial effusion.Tiny subpleural left lower lobe nodule on image 54 is stable. The lungs are otherwise clear.  Upper abdomen: Unremarkable.  There is no adrenal mass.  Musculoskeletal/Chest wall: No chest wall lesion or acute osseous findings.  Review of the MIP images confirms the above findings.  IMPRESSION: Stable chest CT. No evidence of acute pulmonary embolism or other acute findings.   Electronically Signed   By: Roxy Horseman M.D.   On: 04/04/2014 21:46     EKG Interpretation None      MDM   Final diagnoses:  Rheumatoid arthritis    BP 129/71  Pulse 90  Temp(Src) 99.1 F (37.3 C) (Oral)  Resp 20  SpO2 100%  LMP 03/23/2014  I have reviewed nursing notes and vital signs. I personally reviewed the imaging tests through PACS system  I reviewed available ER/hospitalization records thought the EMR   I personally performed the services described in this documentation, which was scribed in my presence. The recorded information has been reviewed and is accurate.     Fayrene Helper, PA-C 04/04/14 2206

## 2014-04-05 NOTE — ED Provider Notes (Signed)
Medical screening examination/treatment/procedure(s) were performed by non-physician practitioner and as supervising physician I was immediately available for consultation/collaboration.   EKG Interpretation None        Vanetta Mulders, MD 04/05/14 [phone]

## 2014-04-08 ENCOUNTER — Telehealth: Payer: Self-pay | Admitting: *Deleted

## 2014-04-08 NOTE — Telephone Encounter (Signed)
Contacted patient via Education officer, community to find out how she is feeling following ED visit. Patient states that she went to ED for severe foot pain that was keeping her from being able to walk. States feeling better now. States that she saw rheumatologist on July 16 th and was given prednisone at that time. States she has a f/u at our office in 3 days.    Ambrose Finland, NP Fredderick Severance, RN            Please call this patient and find out if she is ok. I saw that she went to the hospital yesterday. Find out if she has been back to her follow up visit for her Rheumatoid Arthritis

## 2014-04-11 ENCOUNTER — Ambulatory Visit: Payer: No Typology Code available for payment source | Attending: Internal Medicine | Admitting: Internal Medicine

## 2014-04-11 ENCOUNTER — Encounter: Payer: Self-pay | Admitting: Internal Medicine

## 2014-04-11 ENCOUNTER — Telehealth: Payer: Self-pay | Admitting: *Deleted

## 2014-04-11 VITALS — BP 121/70 | HR 81 | Temp 98.7°F | Resp 18 | Ht 63.0 in | Wt 227.6 lb

## 2014-04-11 DIAGNOSIS — R4589 Other symptoms and signs involving emotional state: Secondary | ICD-10-CM

## 2014-04-11 DIAGNOSIS — I10 Essential (primary) hypertension: Secondary | ICD-10-CM | POA: Insufficient documentation

## 2014-04-11 DIAGNOSIS — Z8249 Family history of ischemic heart disease and other diseases of the circulatory system: Secondary | ICD-10-CM | POA: Insufficient documentation

## 2014-04-11 DIAGNOSIS — M26609 Unspecified temporomandibular joint disorder, unspecified side: Secondary | ICD-10-CM

## 2014-04-11 DIAGNOSIS — M069 Rheumatoid arthritis, unspecified: Secondary | ICD-10-CM | POA: Insufficient documentation

## 2014-04-11 DIAGNOSIS — F341 Dysthymic disorder: Secondary | ICD-10-CM

## 2014-04-11 NOTE — Telephone Encounter (Signed)
Message copied by Fredderick Severance on Thu Apr 11, 2014 12:11 PM ------      Message from: Holland Commons A      Created: Mon Apr 01, 2014  6:38 PM       Hand xrays are normal. She has a small erosion on fifth toe. Please print thes results and give to patient and send a copy to her rheumatologist. Thanks ------

## 2014-04-11 NOTE — Telephone Encounter (Signed)
Results reviewed with patient through interpreter at today's visit. Copy given to patient

## 2014-04-11 NOTE — Progress Notes (Signed)
Patient ID: Karina Murphy, female   DOB: 1981/04/12, 33 y.o.   MRN: [id number]  CC: f/u  HPI:  Patient reports that she was seen in the ER a few days ago for a flare up of her RA.  She reports that she was evaluated by the Rheumatologist and she was given azulfidine and prednisone for flares.  She states that she has been to see the GI doctor who gave her bentyl to take four times per day.  She reports that she has noticed some improvement in nausea and vomiting since beginning this medication.  She reports that her pain has become more intense since beginning her RA medications.  She has only been using the medication for 8 days for RA.  She has a f/u appointment with the rheumatologist in September.  Patient reports that she has been feeling sad because she has decreased functions of her hands due to RA.    No Known Allergies Past Medical History  Diagnosis Date  . Hypertension    Current Outpatient Prescriptions on File Prior to Visit  Medication Sig Dispense Refill  . LORazepam (ATIVAN) 0.5 MG tablet Take 1 tablet (0.5 mg total) by mouth 2 (two) times daily as needed for anxiety.  20 tablet  0  . pantoprazole (PROTONIX) 40 MG tablet Take 1 tablet (40 mg total) by mouth 2 (two) times daily.  60 tablet  3  . predniSONE (DELTASONE) 20 MG tablet Take 1 tablet (20 mg total) by mouth daily with breakfast.  5 tablet  0  . amoxicillin (AMOXIL) 500 MG tablet Take 500 mg by mouth 3 (three) times daily.      Marland Kitchen azithromycin (ZITHROMAX) 250 MG tablet Take two today, and one each day after  6 tablet  0  . ergocalciferol (DRISDOL) 50000 UNITS capsule Take 1 capsule (50,000 Units total) by mouth once a week.  10 capsule  0  . famotidine (PEPCID) 20 MG tablet Take 1 tablet (20 mg total) by mouth 2 (two) times daily.  30 tablet  0  . lisinopril (PRINIVIL,ZESTRIL) 5 MG tablet Take 1 tablet (5 mg total) by mouth daily.  30 tablet  1  . meloxicam (MOBIC) 15 MG tablet Take 1 tablet (15 mg total) by mouth  daily.  30 tablet  0  . metoCLOPramide (REGLAN) 10 MG tablet Take 1 tablet (10 mg total) by mouth every 8 (eight) hours as needed for nausea.  40 tablet  1  . metroNIDAZOLE (FLAGYL) 500 MG tablet Take 500 mg by mouth.      . metroNIDAZOLE (FLAGYL) 500 MG tablet Take 1 tablet (500 mg total) by mouth 2 (two) times daily.  14 tablet  0  . oxyCODONE-acetaminophen (PERCOCET/ROXICET) 5-325 MG per tablet Take 2 tablets by mouth every 4 (four) hours as needed for severe pain.  15 tablet  0  . sucralfate (CARAFATE) 1 G tablet Take 1 g by mouth 4 (four) times daily -  with meals and at bedtime.      . traMADol (ULTRAM) 50 MG tablet Take 1 tablet (50 mg total) by mouth every 8 (eight) hours as needed.  30 tablet  0  . Vitamin D, Ergocalciferol, (DRISDOL) 50000 UNITS CAPS capsule Take 1 capsule (50,000 Units total) by mouth every 7 (seven) days.  12 capsule  0   No current facility-administered medications on file prior to visit.   Family History  Problem Relation Age of Onset  . Diabetes Mother   . Hypertension Mother  History   Social History  . Marital Status: Single    Spouse Name: N/A    Number of Children: N/A  . Years of Education: N/A   Occupational History  . Not on file.   Social History Main Topics  . Smoking status: Never Smoker   . Smokeless tobacco: Never Used  . Alcohol Use: No  . Drug Use: No  . Sexual Activity: Yes    Birth Control/ Protection: Condom   Other Topics Concern  . Not on file   Social History Narrative  . No narrative on file    Review of Systems: See HPI   Objective:   Filed Vitals:   04/11/14 1133  BP: 121/70  Pulse: 81  Temp: 98.7 F (37.1 C)  Resp: 18    Physical Exam: Constitutional: Patient appears well-developed and well-nourished. No distress. HENT: Normocephalic, atraumatic, External right and left ear normal. Oropharynx is clear and moist.  Eyes: Conjunctivae and EOM are normal. PERRLA, no scleral icterus. Neck: Normal ROM.  Neck supple. No JVD. No tracheal deviation. No thyromegaly. CVS: RRR, S1/S2 +, no murmurs, no gallops, no carotid bruit.  Pulmonary: Effort and breath sounds normal, no stridor, rhonchi, wheezes, rales.  Abdominal: Soft. BS +,  no distension, tenderness, rebound or guarding.  Musculoskeletal: Pain with bilateral hand range of motion. Swelling and tenderness of multiple joints of hands. Lymphadenopathy: No lymphadenopathy noted, cervical, inguinal or axillary Neuro: Alert. Normal reflexes, muscle tone coordination. No cranial nerve deficit. Skin: Skin is warm and dry. No rash noted. Not diaphoretic. No erythema. No pallor. Psychiatric: Normal mood and affect. Behavior, judgment, thought content normal.  Lab Results  Component Value Date   WBC 8.4 02/11/2014   HGB 15.0 04/04/2014   HCT 44.0 04/04/2014   MCV 90.7 02/11/2014   PLT 287 02/11/2014   Lab Results  Component Value Date   CREATININE 0.80 04/04/2014   BUN 12 04/04/2014   NA 140 04/04/2014   K 3.4* 04/04/2014   CL 102 04/04/2014   CO2 22 02/27/2014    Lab Results  Component Value Date   HGBA1C 5.7* 12/26/2013   Lipid Panel  No results found for this basename: chol, trig, hdl, cholhdl, vldl, ldlcalc       Assessment and plan:   Karina Murphy was seen today for follow-up.  Diagnoses and associated orders for this visit:  Rheumatoid arthritis Continue with medications for RA TMJ (temporomandibular joint disorder)  Sadness Encouraged patient to seek counseling for mood. Offered patient a appointment with Tywan, LCSW. Patient declined but said she may be more willing to participate if she was able to find a spanish speaking counselor.   Return in about 6 months (around 10/12/2014) for routine.       Holland Commons, NP-C [facility] [phone] 04/14/2014, 11:57 PM

## 2014-04-11 NOTE — Patient Instructions (Signed)
Artritis reumatoidea  (Rheumatoid Arthritis)  La artritis reumatoidea es una enfermedad que causa dolor, inflamacin (hinchazn) y rigidez de las articulaciones. Es una enfermedad de larga evolucin (crnica). Puede afectar a todo el organismo, [employer] ojos y los pulmones.  CUIDADOS EN EL HOGAR   Mantngase fsicamente activo y TEFL teacher la actividad cuando la enfermedad [place].  Consuma alimentos saludables.  Aplquese calor en las articulaciones afectadas al despertarse y antes de [employer]. Aplquese el calor durante el tiempo que le indique su mdico.  Aplquese hielo en las articulaciones afectadas luego de las actividades o de Education administrator ejercicios.  Ponga el hielo en una bolsa plstica.  Colquese una toalla entre la piel y la bolsa de hielo.  Deje el hielo en el lugar durante 15 a 20 minutos, 3 a 4 veces por da.  Tome todos los comprimidos vitamnicos y dietarios (suplementos) exactamente como le indic el mdico.  Use el cabestrillo segn las indicaciones del mdico. Las frulas ayudan a Pharmacologist las articulaciones en una posicin determinada y permite su correcto funcionamiento.  No duerma con almohadas debajo de las rodillas.  Siga los programas que lo mantienen [employer] tratamientos y en las formas de enfrentar la enfermedad. SOLICITE AYUDA DE INMEDIATO SI:   Hay momentos en los que se desvanece (se desmaya).  A veces se siente muy dbil.  Siente calor o dolor de manera sbita en Risk analyst, y es peor que el dolor articular habitual.  Siente escalofros.  Tiene fiebre. Document Released: 02/29/2012 Document Revised: 01/14/2014 [facility] Patient Information 2015 [place], [place]. This information is not intended to replace advice given to you by your health care provider. Make sure you discuss any questions you have with your health care provider.

## 2014-04-11 NOTE — Progress Notes (Signed)
Patient presents for f/u for nausea and abdominal pain States was diagnosed with bacteria in abdomen 6 months ago States was seen in ED 1 week ago for left hand swelling and left foot swelling  That made it difficult to  States saw rheumatologist on 03/28/14 and began new meds

## 2014-04-17 ENCOUNTER — Other Ambulatory Visit: Payer: Self-pay | Admitting: Internal Medicine

## 2014-04-17 ENCOUNTER — Telehealth: Payer: Self-pay | Admitting: *Deleted

## 2014-04-17 ENCOUNTER — Ambulatory Visit: Payer: No Typology Code available for payment source | Attending: Internal Medicine | Admitting: Internal Medicine

## 2014-04-17 ENCOUNTER — Encounter: Payer: Self-pay | Admitting: Internal Medicine

## 2014-04-17 VITALS — BP 123/77 | HR 98 | Temp 99.2°F | Resp 20

## 2014-04-17 DIAGNOSIS — M25539 Pain in unspecified wrist: Secondary | ICD-10-CM | POA: Insufficient documentation

## 2014-04-17 DIAGNOSIS — M069 Rheumatoid arthritis, unspecified: Secondary | ICD-10-CM

## 2014-04-17 MED ORDER — METHYLPREDNISOLONE SODIUM SUCC 125 MG IJ SOLR: 125.0000 mg | Freq: Once | INTRAMUSCULAR | Status: DC

## 2014-04-17 MED ORDER — SODIUM CHLORIDE 0.9 % IV SOLN
125.0000 mg | Freq: Once | INTRAVENOUS | Status: AC
  Administered 2014-04-17: 130 mg via INTRAMUSCULAR

## 2014-04-17 NOTE — Progress Notes (Signed)
Patient presents for severe left wrist pain; rates10/10 at present  States it feels like it will break with any movement Interpreter present  Per provider patient given solu-medrol 125 mg IM and discharged to home in stable condition.

## 2014-04-17 NOTE — Telephone Encounter (Signed)
Patient called via in-house interpreter. Patient states she has left wrist pain that feels like her wrist is going to break. States her face is red and she has a fever of 101. Patient is crying.  Patient transferred to Child psychotherapist. Provider notified and patient asked to come in to office. States she will have someone drive her here.

## 2014-05-08 ENCOUNTER — Encounter (HOSPITAL_COMMUNITY): Payer: Self-pay | Admitting: Emergency Medicine

## 2014-05-08 ENCOUNTER — Emergency Department (HOSPITAL_COMMUNITY): Payer: No Typology Code available for payment source

## 2014-05-08 DIAGNOSIS — Z79899 Other long term (current) drug therapy: Secondary | ICD-10-CM | POA: Insufficient documentation

## 2014-05-08 DIAGNOSIS — IMO0002 Reserved for concepts with insufficient information to code with codable children: Secondary | ICD-10-CM | POA: Insufficient documentation

## 2014-05-08 DIAGNOSIS — R079 Chest pain, unspecified: Secondary | ICD-10-CM | POA: Insufficient documentation

## 2014-05-08 DIAGNOSIS — K219 Gastro-esophageal reflux disease without esophagitis: Secondary | ICD-10-CM | POA: Insufficient documentation

## 2014-05-08 DIAGNOSIS — I10 Essential (primary) hypertension: Secondary | ICD-10-CM | POA: Insufficient documentation

## 2014-05-08 LAB — I-STAT TROPONIN, ED: TROPONIN I, POC: 0 ng/mL (ref 0.00–0.08)

## 2014-05-08 NOTE — ED Notes (Signed)
Pt reports mid sternal cp/burning for 30 mins with nausea and difficulty breathing with cough. Daughter reports they sprayed the house with bug spray this evening and concerned it got her mothers medication contaminated.

## 2014-05-09 ENCOUNTER — Emergency Department (HOSPITAL_COMMUNITY)
Admission: EM | Admit: 2014-05-09 | Discharge: 2014-05-09 | Disposition: A | Discharge status: Home / Self Care | Payer: No Typology Code available for payment source | Attending: Emergency Medicine | Admitting: Emergency Medicine

## 2014-05-09 DIAGNOSIS — K219 Gastro-esophageal reflux disease without esophagitis: Secondary | ICD-10-CM

## 2014-05-09 LAB — BASIC METABOLIC PANEL
ANION GAP: 15 (ref 5–15)
BUN: 13 mg/dL (ref 6–23)
CO2: 22 mEq/L (ref 19–32)
Calcium: 9.5 mg/dL (ref 8.4–10.5)
Chloride: 103 mEq/L (ref 96–112)
Creatinine, Ser: 0.8 mg/dL (ref 0.50–1.10)
GFR calc non Af Amer: >90 mL/min (ref >90)
Glucose, Bld: 106 mg/dL — ABNORMAL HIGH (ref 70–99)
Potassium: 3.5 mEq/L — ABNORMAL LOW (ref 3.7–5.3)
Sodium: 140 mEq/L (ref 137–147)

## 2014-05-09 LAB — CBC
HCT: 35.3 % — ABNORMAL LOW (ref 36.0–46.0)
Hemoglobin: 11.7 g/dL — ABNORMAL LOW (ref 12.0–15.0)
MCH: 29.3 pg (ref 26.0–34.0)
MCHC: 33.1 g/dL (ref 30.0–36.0)
MCV: 88.5 fL (ref 78.0–100.0)
PLATELETS: 244 10*3/uL (ref 150–400)
RBC: 3.99 MIL/uL (ref 3.87–5.11)
RDW: 13.8 % (ref 11.5–15.5)
WBC: 7.9 10*3/uL (ref 4.0–10.5)

## 2014-05-09 MED ORDER — KETOROLAC TROMETHAMINE 60 MG/2ML IM SOLN
60.0000 mg | Freq: Once | INTRAMUSCULAR | Status: AC
  Administered 2014-05-09: 60 mg via INTRAMUSCULAR
  Filled 2014-05-09: qty 2

## 2014-05-09 MED ORDER — GI COCKTAIL ~~LOC~~
30.0000 mL | Freq: Once | ORAL | Status: AC
  Administered 2014-05-09: 30 mL via ORAL
  Filled 2014-05-09: qty 30

## 2014-05-09 MED ORDER — FAMOTIDINE 20 MG PO TABS: 20.0000 mg | ORAL_TABLET | Freq: Two times a day (BID) | ORAL | Status: DC

## 2014-05-09 MED ORDER — FAMOTIDINE 20 MG PO TABS
20.0000 mg | ORAL_TABLET | Freq: Once | ORAL | Status: AC
  Administered 2014-05-09: 20 mg via ORAL
  Filled 2014-05-09: qty 1

## 2014-05-09 NOTE — Discharge Instructions (Signed)
Please call your doctor for a followup appointment within 24-48 hours. When you talk to your doctor please let them know that you were seen in the emergency department and have them acquire all of your records so that they can discuss the findings with you and formulate a treatment plan to fully care for your new and ongoing problems. ° °

## 2014-05-09 NOTE — ED Notes (Signed)
Discharge instructions reviewed with pt. Pt verbalized understanding.   

## 2014-05-09 NOTE — ED Provider Notes (Signed)
CSN: [id number]     Arrival date & time 05/08/14  2255 History   First MD Initiated Contact with Patient 05/09/14 0125     Chief Complaint  Patient presents with  . Chest Pain     (Consider location/radiation/quality/duration/timing/severity/associated sxs/prior Treatment) HPI Comments: 33 year old female, history of arthritis he takes Naprosyn fairly frequently and also has a history of taking prednisone as well as sulfasalazine. She presents to the hospital with a complaint of a burning sensation in the epigastrium and left upper quadrant and lower chest, associated with a headache and what she describes as vertigo. There has been some nausea. She denies any diarrhea, bleeding, swelling, cough, fever. She denies having these symptoms in the past. She had enchiladas and beans with dinner, this is not uncommon for her. She denies any prior abdominal surgery.  Patient is a 33 y.o. female presenting with chest pain. The history is provided by the patient.  Chest Pain   Past Medical History  Diagnosis Date  . Hypertension    History reviewed. No pertinent past surgical history. Family History  Problem Relation Age of Onset  . Diabetes Mother   . Hypertension Mother    History  Substance Use Topics  . Smoking status: Never Smoker   . Smokeless tobacco: Never Used  . Alcohol Use: No   OB History   Grav Para Term Preterm Abortions TAB SAB Ect Mult Living                 Review of Systems  Cardiovascular: Positive for chest pain.  All other systems reviewed and are negative.     Allergies  Review of patient's allergies indicates no known allergies.  Home Medications   Prior to Admission medications   Medication Sig Start Date End Date Taking? Authorizing Provider  folic acid (FOLVITE) 1 MG tablet Take 1 mg by mouth daily.   Yes Historical Provider, MD  naproxen sodium (ANAPROX) 220 MG tablet Take 220 mg by mouth 2 (two) times daily as needed (for pain).   Yes Historical  Provider, MD  predniSONE (DELTASONE) 20 MG tablet Take 1 tablet (20 mg total) by mouth daily with breakfast. 03/20/14  Yes Ambrose Finland, NP  sulfaSALAzine (AZULFIDINE) 500 MG tablet Take 1,000 mg by mouth daily.   Yes Historical Provider, MD  famotidine (PEPCID) 20 MG tablet Take 1 tablet (20 mg total) by mouth 2 (two) times daily. 05/09/14   Vida Roller, MD   BP 115/64  Pulse 66  Temp(Src) 98.8 F (37.1 C) (Oral)  Resp 20  SpO2 98%  LMP 03/23/2014 Physical Exam  Nursing note and vitals reviewed. Constitutional: She appears well-developed and well-nourished. No distress.  HENT:  Head: Normocephalic and atraumatic.  Mouth/Throat: Oropharynx is clear and moist. No oropharyngeal exudate.  Eyes: Conjunctivae and EOM are normal. Pupils are equal, round, and reactive to light. Right eye exhibits no discharge. Left eye exhibits no discharge. No scleral icterus.  Neck: Normal range of motion. Neck supple. No JVD present. No thyromegaly present.  Cardiovascular: Normal rate, regular rhythm, normal heart sounds and intact distal pulses.  Exam reveals no gallop and no friction rub.   No murmur heard. Pulmonary/Chest: Effort normal and breath sounds normal. No respiratory distress. She has no wheezes. She has no rales.  Abdominal: Soft. Bowel sounds are normal. She exhibits no distension and no mass. There is tenderness ( minimal LUQ and epigastric TTP).  Musculoskeletal: Normal range of motion. She exhibits no edema and  no tenderness.  Lymphadenopathy:    She has no cervical adenopathy.  Neurological: She is alert. Coordination normal.  Skin: Skin is warm and dry. No rash noted. No erythema.  Psychiatric: She has a normal mood and affect. Her behavior is normal.    ED Course  Procedures (including critical care time) Labs Review Labs Reviewed  CBC - Abnormal; Notable for the following:    Hemoglobin 11.7 (*)    HCT 35.3 (*)    All other components within normal limits  BASIC METABOLIC  PANEL - Abnormal; Notable for the following:    Potassium 3.5 (*)    Glucose, Bld 106 (*)    All other components within normal limits  I-STAT TROPOININ, ED    Imaging Review Dg Chest 2 View  05/08/2014   CLINICAL DATA:  Chest pain  EXAM: CHEST  2 VIEW  COMPARISON:  04/04/2014  FINDINGS: The heart size and mediastinal contours are within normal limits. Both lungs are clear. The visualized skeletal structures are unremarkable.  IMPRESSION: No active cardiopulmonary disease.   Electronically Signed   By: Burman Nieves M.D.   On: 05/08/2014 23:54    ED ECG REPORT  I personally interpreted this EKG   Date: 05/09/2014   Rate: 106  Rhythm: normal sinus rhythm  QRS Axis: normal  Intervals: normal  ST/T Wave abnormalities: normal  Conduction Disutrbances:none  Narrative Interpretation:   Old EKG Reviewed: none available  MDM   Final diagnoses:  Gastroesophageal reflux disease, esophagitis presence not specified    The patient has a normal exam other than minimal epigastric discomfort, she does not have any clinical vertigo, no nystagmus, vital signs are unremarkable and she is not tachycardic on my exam. EKG is nonischemic and labs are unremarkable as well. We'll treat for acid reflux, pain medication for headache, possibly has an element of gastritis given her use of prednisone and anti-inflammatories in the past. No anemia, no abnormalities, normal troponin.  The patient states that her symptoms are almost 100% better after receiving a GI cocktail, labs reviewed, unremarkable, stable for discharge on Pepcid.   Meds given in ED:  Medications  ketorolac (TORADOL) injection 60 mg (60 mg Intramuscular Given 05/09/14 0204)  famotidine (PEPCID) tablet 20 mg (20 mg Oral Given 05/09/14 0204)  gi cocktail (Maalox,Lidocaine,Donnatal) (30 mLs Oral Given 05/09/14 0204)    New Prescriptions   FAMOTIDINE (PEPCID) 20 MG TABLET    Take 1 tablet (20 mg total) by mouth 2 (two) times daily.       Vida Roller, MD 05/09/14 [phone]

## 2014-05-17 ENCOUNTER — Other Ambulatory Visit: Payer: Self-pay | Admitting: Internal Medicine

## 2014-05-17 ENCOUNTER — Telehealth: Payer: Self-pay | Admitting: *Deleted

## 2014-05-17 DIAGNOSIS — F411 Generalized anxiety disorder: Secondary | ICD-10-CM

## 2014-05-17 MED ORDER — LORAZEPAM 0.5 MG PO TABS: 0.5000 mg | ORAL_TABLET | Freq: Every day | ORAL | Status: DC | PRN

## 2014-05-17 MED ORDER — ESCITALOPRAM OXALATE 10 MG PO TABS: 10.0000 mg | ORAL_TABLET | Freq: Every day | ORAL | Status: DC

## 2014-05-17 NOTE — Telephone Encounter (Signed)
Karina Murphy our translator spoke to pt and informed her that she had some medications here for the pt's anxiety.

## 2014-05-23 ENCOUNTER — Ambulatory Visit: Payer: Self-pay | Admitting: Internal Medicine

## 2014-06-05 ENCOUNTER — Telehealth: Payer: Self-pay | Admitting: Internal Medicine

## 2014-06-05 NOTE — Telephone Encounter (Signed)
Pt. Came to drop off her blood work results from [facility]. Pt states that she was told to bring back her results for her PCP to view. Pt. States that the Rheumatologist advised her to keep doing the same blood work every 8 weeks. Please f/u with pt. For any further questions.

## 2014-06-18 ENCOUNTER — Ambulatory Visit: Payer: Self-pay

## 2014-06-28 ENCOUNTER — Encounter: Payer: Self-pay | Admitting: Internal Medicine

## 2014-06-28 ENCOUNTER — Ambulatory Visit: Payer: Self-pay | Attending: Internal Medicine | Admitting: Internal Medicine

## 2014-06-28 VITALS — BP 136/82 | HR 68 | Temp 98.4°F | Resp 16 | Ht 63.0 in | Wt 232.0 lb

## 2014-06-28 DIAGNOSIS — Z23 Encounter for immunization: Secondary | ICD-10-CM | POA: Insufficient documentation

## 2014-06-28 DIAGNOSIS — F411 Generalized anxiety disorder: Secondary | ICD-10-CM | POA: Insufficient documentation

## 2014-06-28 MED ORDER — LORAZEPAM 0.5 MG PO TABS: 0.5000 mg | ORAL_TABLET | Freq: Every day | ORAL | Status: DC | PRN

## 2014-06-28 MED ORDER — ESCITALOPRAM OXALATE 10 MG PO TABS: 10.0000 mg | ORAL_TABLET | Freq: Every day | ORAL | Status: DC

## 2014-06-28 NOTE — Progress Notes (Signed)
Patient ID: Karina Murphy, female   DOB: 07/21/1981, 33 y.o.   MRN: [id number]  CC:  Follow up  HPI:  Patient presents to clinic today for a follow up of anxiety.  She states that she has been taking Lexapro which has helped with some of her anxiety.  Patient reports that she still has not attended any counseling courses to help deal with daily stress.  Patient reports improvement in rheumatoid arthritis symptoms.   No Known Allergies Past Medical History  Diagnosis Date  . Hypertension    Current Outpatient Prescriptions on File Prior to Visit  Medication Sig Dispense Refill  . escitalopram (LEXAPRO) 10 MG tablet Take 1 tablet (10 mg total) by mouth daily.  30 tablet  1  . folic acid (FOLVITE) 1 MG tablet Take 1 mg by mouth daily.      Marland Kitchen LORazepam (ATIVAN) 0.5 MG tablet Take 1 tablet (0.5 mg total) by mouth daily as needed for anxiety.  20 tablet  0  . predniSONE (DELTASONE) 20 MG tablet Take 1 tablet (20 mg total) by mouth daily with breakfast.  5 tablet  0  . sulfaSALAzine (AZULFIDINE) 500 MG tablet Take 1,000 mg by mouth daily.      . famotidine (PEPCID) 20 MG tablet Take 1 tablet (20 mg total) by mouth 2 (two) times daily.  30 tablet  0  . naproxen sodium (ANAPROX) 220 MG tablet Take 220 mg by mouth 2 (two) times daily as needed (for pain).       No current facility-administered medications on file prior to visit.   Family History  Problem Relation Age of Onset  . Diabetes Mother   . Hypertension Mother    History   Social History  . Marital Status: Single    Spouse Name: N/A    Number of Children: N/A  . Years of Education: N/A   Occupational History  . Not on file.   Social History Main Topics  . Smoking status: Never Smoker   . Smokeless tobacco: Never Used  . Alcohol Use: No  . Drug Use: No  . Sexual Activity: Yes    Birth Control/ Protection: Condom   Other Topics Concern  . Not on file   Social History Narrative  . No narrative on file    Review  of Systems: Constitutional: Negative for fever, chills, diaphoresis, activity change, appetite change and fatigue. HENT: Negative for ear pain, nosebleeds, congestion, facial swelling, rhinorrhea, neck pain, neck stiffness and ear discharge.  Eyes: Negative for pain, discharge, redness, itching and visual disturbance. Respiratory: Negative for cough, choking, chest tightness, shortness of breath, wheezing and stridor.  Cardiovascular: Negative for chest pain, palpitations and leg swelling. Gastrointestinal: Negative for abdominal distention. Genitourinary: Negative for dysuria, urgency, frequency, hematuria, flank pain, decreased urine volume, difficulty urinating and dyspareunia.  Musculoskeletal: Negative for back pain, joint swelling, arthralgias and gait problem. Neurological: Negative for dizziness, tremors, seizures, syncope, facial asymmetry, speech difficulty, weakness, light-headedness, numbness and headaches.  Hematological: Negative for adenopathy. Does not bruise/bleed easily.    Objective:   Filed Vitals:   06/28/14 1704  BP: 136/82  Pulse: 68  Temp: 98.4 F (36.9 C)  Resp: 16    Physical Exam  Cardiovascular: Normal rate, regular rhythm and normal heart sounds.   Pulmonary/Chest: Effort normal and breath sounds normal.  Abdominal: Soft. Bowel sounds are normal.  Musculoskeletal: She exhibits no edema.     Lab Results  Component Value Date   WBC 7.9 05/08/2014  HGB 11.7* 05/08/2014   HCT 35.3* 05/08/2014   MCV 88.5 05/08/2014   PLT 244 05/08/2014   Lab Results  Component Value Date   CREATININE 0.80 05/08/2014   BUN 13 05/08/2014   NA 140 05/08/2014   K 3.5* 05/08/2014   CL 103 05/08/2014   CO2 22 05/08/2014    Lab Results  Component Value Date   HGBA1C 5.7* 12/26/2013   Lipid Panel  No results found for this basename: chol, trig, hdl, cholhdl, vldl, ldlcalc       Assessment and plan:   Karina Murphy was seen today for follow-up.  Diagnoses and associated  orders for this visit:  Anxiety state - escitalopram (LEXAPRO) 10 MG tablet; Take 1 tablet (10 mg total) by mouth daily. - LORazepam (ATIVAN) 0.5 MG tablet; Take 1 tablet (0.5 mg total) by mouth daily as needed for anxiety.  Need for influenza vaccination Received  Return if symptoms worsen or fail to improve.        Holland Commons, NP-C [facility] [phone] 06/28/2014, 5:44 PM

## 2014-06-28 NOTE — Progress Notes (Signed)
Pt was prescribed lexapro and the doctor wanted her to follow up today to see how the pt was doing.

## 2014-07-05 ENCOUNTER — Telehealth: Payer: Self-pay | Admitting: Internal Medicine

## 2014-07-05 NOTE — Telephone Encounter (Signed)
PT called stating that she contacted the breast center and they do not have a referral on file. Please follow up with pt once referral is in place.

## 2014-07-07 NOTE — Telephone Encounter (Signed)
Noreene Larsson please find out if a referral was sent for this patient, if not please place one and call patient to inform

## 2014-07-08 ENCOUNTER — Other Ambulatory Visit: Payer: Self-pay | Admitting: Emergency Medicine

## 2014-07-08 DIAGNOSIS — Z1239 Encounter for other screening for malignant neoplasm of breast: Secondary | ICD-10-CM

## 2014-07-08 NOTE — Telephone Encounter (Signed)
Referral placed for mammogram Pt will be notified by in house interpretor

## 2014-07-09 ENCOUNTER — Telehealth: Payer: Self-pay | Admitting: Emergency Medicine

## 2014-07-09 NOTE — Telephone Encounter (Signed)
Pt informed referral for mammogram placed. Informed she can call breast center for appointment In house Spanish interpretor present

## 2014-07-10 ENCOUNTER — Other Ambulatory Visit: Payer: Self-pay | Admitting: Internal Medicine

## 2014-07-10 DIAGNOSIS — Z79899 Other long term (current) drug therapy: Secondary | ICD-10-CM

## 2014-07-17 ENCOUNTER — Encounter: Payer: Self-pay | Admitting: Internal Medicine

## 2014-07-17 ENCOUNTER — Ambulatory Visit: Payer: Self-pay | Attending: Internal Medicine | Admitting: Internal Medicine

## 2014-07-17 VITALS — BP 124/76 | HR 108 | Temp 99.7°F | Resp 18

## 2014-07-17 DIAGNOSIS — F411 Generalized anxiety disorder: Secondary | ICD-10-CM

## 2014-07-17 DIAGNOSIS — R112 Nausea with vomiting, unspecified: Secondary | ICD-10-CM

## 2014-07-17 MED ORDER — OMEPRAZOLE 20 MG PO CPDR: 20.0000 mg | DELAYED_RELEASE_CAPSULE | Freq: Every day | ORAL | Status: DC

## 2014-07-17 MED ORDER — ONDANSETRON HCL 4 MG/2ML IJ SOLN
4.0000 mg | Freq: Once | INTRAMUSCULAR | Status: AC
  Administered 2014-07-17: 4 mg via INTRAMUSCULAR

## 2014-07-17 MED ORDER — LORAZEPAM 2 MG/ML IJ SOLN
1.0000 mg | Freq: Once | INTRAMUSCULAR | Status: AC
  Administered 2014-07-17: 1 mg via INTRAMUSCULAR

## 2014-07-17 MED ORDER — ONDANSETRON 4 MG PO TBDP: 4.0000 mg | ORAL_TABLET | Freq: Three times a day (TID) | ORAL | Status: DC | PRN

## 2014-07-17 NOTE — Progress Notes (Signed)
Pt comes in possible Gastroenteritis  States her friend child had same sx's yesterday in which they live in the same house Pt c/o vomiting x3 today while at work with feeling dizzy,flushef face,and head pressure Spanish interpretor present with friend Emesis noted while in room C/o facial numbness

## 2014-07-17 NOTE — Patient Instructions (Signed)
Náuseas y Vómitos °(Nausea and Vomiting) °La náusea es la sensación de malestar en el estómago o de la necesidad de vomitar. El vómito es un reflejo por el que los contenidos del estómago salen por la boca. El vómito puede ocasionar pérdida de líquidos del organismo (deshidratación). Los niños y los adultos mayores pueden deshidratarse rápidamente (en especial si también tienen diarrea). Las náuseas y los vómitos son síntoma de un trastorno o enfermedad. Es importante averiguar la causa de los síntomas. °CAUSAS °· Irritación directa de la membrana que cubre el estómago. Esta irritación puede ser resultado del aumento de la producción de ácido, (reflujo gastroesofágico), infecciones, intoxicación alimentaria, ciertos medicamentos (como antinflamatorios no esteroideos), consumo de alcohol o de tabaco. °· Señales del cerebro. Estas señales pueden ser un dolor de cabeza, exposición al calor, trastornos del oído interno, aumento de la presión en el cerebro por lesiones, infección, un tumor o conmoción cerebral, estímulos emocionales o problemas metabólicos. °· Una obstrucción en el tracto gastrointestinal (obstrucción intestinal). °· Ciertas enfermedades como la diabetes, problemas en la vesícula biliar, apendicitis, problemas renales, cáncer, sepsis, síntomas atípicos de infarto o trastornos alimentarios. °· Tratamientos médicos como la quimioterapia y la radiación. °· Medicamentos que inducen al sueño (anestesia general) durante una cirugía. °DIAGNÓSTICO  °El médico podrá solicitarle algunos análisis si los problemas no mejoran luego de algunos días. También podrán pedirle análisis si los síntomas son graves o si el motivo de los vómitos o las náuseas no está claro. Los análisis pueden ser:  °· Análisis de orina. °· Análisis de sangre. °· Pruebas de materia fecal. °· Cultivos (para buscar evidencias de infección). °· Radiografías u otros estudios por imágenes. °Los resultados de las pruebas lo ayudarán al médico a  tomar decisiones acerca del mejor curso de tratamiento o la necesidad de análisis adicionales.  °TRATAMIENTO  °Debe estar bien hidratado. Beba con frecuencia pequeñas cantidades de líquido. Puede beber agua, bebidas deportivas, caldos claros o comer pequeños trocitos de hielo o gelatina para mantenerse hidratado. Cuando coma, hágalo lentamente para evitar las náuseas. Hay medicamentos para evitar las náuseas que pueden aliviarlo.  °INSTRUCCIONES PARA EL CUIDADO DOMICILIARIO °· Si su médico le prescribe medicamentos tómelos como se le haya indicado. °· Si no tiene hambre, no se fuerce a comer. Sin embargo, es necesario que tome líquidos. °· Si tiene hambre aliméntese con una dieta normal, a menos que el médico le indique otra cosa. °¨ Los mejores alimentos son una combinación de carbohidratos complejos (arroz, trigo, papas, pan), carnes magras, yogur, frutas y vegetales. °¨ Evite los alimentos ricos en grasas porque dificultan la digestión. °· Beba gran cantidad de líquido para mantener la orina de tono claro o color amarillo pálido. °· Si está deshidratado, consulte a su médico para que le dé instrucciones específicas para volver a hidratarlo. Los signos de deshidratación son: °¨ Mucha sed. °¨ Labios y boca secos. °¨ Mareos. °¨ Orina oscura. °¨ Disminución de la frecuencia y cantidad de la orina. °¨ Confusión. °¨ Tiene el pulso o la respiración acelerados. °SOLICITE ATENCIÓN MÉDICA DE INMEDIATO SI: °· Vomita sangre o algo similar a la borra del café. °· La materia fecal (heces) es negra o tiene sangre. °· Sufre una cefalea grave o rigidez en el cuello. °· Se siente confundido. °· Siente dolor abdominal intenso. °· Tiene dolor en el pecho o dificultad para respirar. °· No orina por 8 horas. °· Tiene la piel fría y pegajosa. °· Sigue vomitando durante más de 24 a 48 horas. °· Tiene fiebre. °ASEGÚRESE QUE:  °· Comprende   estas instrucciones. °· Controlará su enfermedad. °· Solicitará ayuda inmediatamente si no mejora o  si empeora. °Document Released: 09/19/2007 Document Revised: 11/22/2011 °ExitCare® Patient Information ©2015 [employer]. This information is not intended to replace advice given to you by your health care provider. Make sure you discuss any questions you have with your health care provider. ° °

## 2014-07-17 NOTE — Progress Notes (Signed)
Patient ID: Karina Murphy, female   DOB: 09-Nov-1980, 33 y.o.   MRN: [id number]  CC: Nausea  HPI:  Karina Murphy is a 33 y.o. female with a long standing history of anxiety and abdominal issues.  Patient's history is significant for past traumas since living here in the [place].  She reports today that she was at work and began to have nausea and vomiting with chest pain and facial numbness.  She has taken her pepcid today.  She reports that she has been exposed to a sick child with similar symptoms yesterday of nausea and vomiting.  She denies diarrhea but has some epigastric discomfort.    No Known Allergies Past Medical History  Diagnosis Date  . Hypertension    Current Outpatient Prescriptions on File Prior to Visit  Medication Sig Dispense Refill  . escitalopram (LEXAPRO) 10 MG tablet Take 1 tablet (10 mg total) by mouth daily. 30 tablet 3  . famotidine (PEPCID) 20 MG tablet Take 1 tablet (20 mg total) by mouth 2 (two) times daily. 30 tablet 0  . folic acid (FOLVITE) 1 MG tablet Take 1 mg by mouth daily.    Marland Kitchen LORazepam (ATIVAN) 0.5 MG tablet Take 1 tablet (0.5 mg total) by mouth daily as needed for anxiety. 20 tablet 1  . naproxen sodium (ANAPROX) 220 MG tablet Take 220 mg by mouth 2 (two) times daily as needed (for pain).    . predniSONE (DELTASONE) 20 MG tablet Take 1 tablet (20 mg total) by mouth daily with breakfast. 5 tablet 0  . sulfaSALAzine (AZULFIDINE) 500 MG tablet Take 1,000 mg by mouth daily.     No current facility-administered medications on file prior to visit.   Family History  Problem Relation Age of Onset  . Diabetes Mother   . Hypertension Mother    History   Social History  . Marital Status: Single    Spouse Name: N/A    Number of Children: N/A  . Years of Education: N/A   Occupational History  . Not on file.   Social History Main Topics  . Smoking status: Never Smoker   . Smokeless tobacco: Never Used  . Alcohol Use: No  . Drug Use: No  .  Sexual Activity: Yes    Birth Control/ Protection: Condom   Other Topics Concern  . Not on file   Social History Narrative  . No narrative on file    Review of Systems  Constitutional: Positive for fever. Negative for chills and malaise/fatigue.  Respiratory: Negative.   Cardiovascular: Positive for chest pain. Negative for leg swelling.  Gastrointestinal: Positive for nausea, vomiting and abdominal pain. Negative for diarrhea.  Neurological: Positive for dizziness and headaches. Negative for weakness.     Objective:   Filed Vitals:   07/17/14 1601  BP: 124/76  Pulse: 108  Temp: 99.7 F (37.6 C)  Resp: 18    Physical Exam  Constitutional: She is oriented to person, place, and time.  Cardiovascular: Normal rate, regular rhythm and normal heart sounds.   Pulmonary/Chest: Effort normal and breath sounds normal.  Abdominal: Soft. There is tenderness (epigastric and RLQ).  Sluggish bowel sounds  Neurological: She is alert and oriented to person, place, and time.  Skin: Skin is warm. She is diaphoretic.  Flushed  Psychiatric:  Anxious with tremors     Lab Results  Component Value Date   WBC 7.9 05/08/2014   HGB 11.7* 05/08/2014   HCT 35.3* 05/08/2014   MCV 88.5 05/08/2014  PLT 244 05/08/2014   Lab Results  Component Value Date   CREATININE 0.80 05/08/2014   BUN 13 05/08/2014   NA 140 05/08/2014   K 3.5* 05/08/2014   CL 103 05/08/2014   CO2 22 05/08/2014    Lab Results  Component Value Date   HGBA1C 5.7* 12/26/2013   Lipid Panel  No results found for: CHOL, TRIG, HDL, CHOLHDL, VLDL, LDLCALC     Assessment and plan:   [name] was seen today for abdominal pain, gi problem, emesis and follow-up.  Diagnoses and associated orders for this visit:  Non-intractable vomiting with nausea, vomiting of unspecified type - ondansetron (ZOFRAN) injection 4 mg; Inject 2 mLs (4 mg total) into the muscle once. - omeprazole (PRILOSEC) 20 MG capsule; Take 1 capsule  (20 mg total) by mouth daily. - ondansetron (ZOFRAN ODT) 4 MG disintegrating tablet; Take 1 tablet (4 mg total) by mouth every 8 (eight) hours as needed for nausea or vomiting.  Anxiety state - LORazepam (ATIVAN) injection 1 mg; Inject 0.5 mLs (1 mg total) into the muscle once.   Return if symptoms worsen or fail to improve.        Holland Commons, NP-C [facility] [phone] 07/21/2014, 3:58 PM

## 2014-07-19 ENCOUNTER — Other Ambulatory Visit: Payer: Self-pay | Admitting: Internal Medicine

## 2014-07-19 DIAGNOSIS — N6009 Solitary cyst of unspecified breast: Secondary | ICD-10-CM

## 2014-07-26 ENCOUNTER — Other Ambulatory Visit: Payer: Self-pay | Admitting: Internal Medicine

## 2014-07-26 ENCOUNTER — Other Ambulatory Visit: Payer: Self-pay

## 2014-07-26 DIAGNOSIS — N6009 Solitary cyst of unspecified breast: Secondary | ICD-10-CM

## 2014-08-01 ENCOUNTER — Other Ambulatory Visit: Payer: Self-pay

## 2014-08-01 ENCOUNTER — Ambulatory Visit
Admission: RE | Admit: 2014-08-01 | Discharge: 2014-08-01 | Disposition: A | Discharge status: Home / Self Care | Payer: No Typology Code available for payment source | Source: Ambulatory Visit | Attending: Internal Medicine | Admitting: Internal Medicine

## 2014-08-01 DIAGNOSIS — N6009 Solitary cyst of unspecified breast: Secondary | ICD-10-CM

## 2014-08-12 ENCOUNTER — Encounter: Payer: Self-pay | Admitting: Internal Medicine

## 2014-08-12 ENCOUNTER — Telehealth: Payer: Self-pay | Admitting: Internal Medicine

## 2014-08-12 ENCOUNTER — Ambulatory Visit: Payer: Self-pay | Attending: Internal Medicine | Admitting: Internal Medicine

## 2014-08-12 VITALS — BP 116/72 | HR 77 | Temp 98.0°F | Resp 18 | Ht 64.0 in | Wt 233.0 lb

## 2014-08-12 DIAGNOSIS — M069 Rheumatoid arthritis, unspecified: Secondary | ICD-10-CM

## 2014-08-12 DIAGNOSIS — M7989 Other specified soft tissue disorders: Secondary | ICD-10-CM | POA: Insufficient documentation

## 2014-08-12 DIAGNOSIS — R350 Frequency of micturition: Secondary | ICD-10-CM

## 2014-08-12 LAB — POCT URINALYSIS DIPSTICK
BILIRUBIN UA: NEGATIVE
Blood, UA: NEGATIVE
GLUCOSE UA: NEGATIVE
KETONES UA: NEGATIVE
Leukocytes, UA: NEGATIVE
NITRITE UA: NEGATIVE
PH UA: 5.5
Protein, UA: NEGATIVE
Spec Grav, UA: 1.015
Urobilinogen, UA: 0.2

## 2014-08-12 MED ORDER — TRAMADOL HCL 50 MG PO TABS: 50.0000 mg | ORAL_TABLET | Freq: Three times a day (TID) | ORAL | Status: DC | PRN

## 2014-08-12 MED ORDER — PREDNISONE 10 MG PO TABS: 10.0000 mg | ORAL_TABLET | Freq: Every day | ORAL | Status: DC

## 2014-08-12 NOTE — Progress Notes (Signed)
Complaining leg and feet swelling in the morning Legs pain and cramps, wors when walking x1 wk

## 2014-08-12 NOTE — Progress Notes (Signed)
Patient ID: Karina Murphy, female   DOB: 1980-09-30, 33 y.o.   MRN: [id number]   CC: leg swelling  HPI:  Patient presents to clinic today complaining of a one week history of leg swelling and leg pain that is aggravated by walking. She states that when she wakes in the morning she has swelling in the joints of her fingers and her left leg.  She reports that she has severe pain in her fourth toe of her left foot and pain on the lateral edge of her foot. She has not noticed any redness or warmth of the joints.  Patient also c/o of urinary frequency for the past week.  She denies fever, chills, dysuria, pelvic pain/pressure, or flank pain.    No Known Allergies Past Medical History  Diagnosis Date  . Hypertension   . Arthritis    Current Outpatient Prescriptions on File Prior to Visit  Medication Sig Dispense Refill  . escitalopram (LEXAPRO) 10 MG tablet Take 1 tablet (10 mg total) by mouth daily. 30 tablet 3  . folic acid (FOLVITE) 1 MG tablet Take 1 mg by mouth daily.    Marland Kitchen LORazepam (ATIVAN) 0.5 MG tablet Take 1 tablet (0.5 mg total) by mouth daily as needed for anxiety. 20 tablet 1  . omeprazole (PRILOSEC) 20 MG capsule Take 1 capsule (20 mg total) by mouth daily. 30 capsule 3  . ondansetron (ZOFRAN ODT) 4 MG disintegrating tablet Take 1 tablet (4 mg total) by mouth every 8 (eight) hours as needed for nausea or vomiting. 20 tablet 0  . sulfaSALAzine (AZULFIDINE) 500 MG tablet Take 1,000 mg by mouth daily.    . naproxen sodium (ANAPROX) 220 MG tablet Take 220 mg by mouth 2 (two) times daily as needed (for pain).    . predniSONE (DELTASONE) 20 MG tablet Take 1 tablet (20 mg total) by mouth daily with breakfast. (Patient not taking: Reported on 08/12/2014) 5 tablet 0   No current facility-administered medications on file prior to visit.   Family History  Problem Relation Age of Onset  . Diabetes Mother   . Hypertension Mother    History   Social History  . Marital Status: Single     Spouse Name: N/A    Number of Children: N/A  . Years of Education: N/A   Occupational History  . Not on file.   Social History Main Topics  . Smoking status: Never Smoker   . Smokeless tobacco: Never Used  . Alcohol Use: No  . Drug Use: No  . Sexual Activity: Yes    Birth Control/ Protection: Condom   Other Topics Concern  . Not on file   Social History Narrative    Review of Systems  Gastrointestinal: Negative for abdominal pain.  Genitourinary: Positive for frequency. Negative for dysuria, urgency, hematuria and flank pain.  Musculoskeletal: Positive for joint pain.  All other systems reviewed and are negative.     Objective:   Filed Vitals:   08/12/14 1213  BP: 116/72  Pulse: 77  Temp: 98 F (36.7 C)  Resp: 18    Physical Exam  Constitutional: She is oriented to person, place, and time.  Cardiovascular: Normal rate, regular rhythm and normal heart sounds.   Pulmonary/Chest: Effort normal and breath sounds normal.  Musculoskeletal: Normal range of motion. She exhibits edema (joint spaces of bilateral hands/fingers) and tenderness.  Neurological: She is alert and oriented to person, place, and time.  Skin: Skin is warm and dry. No erythema.   Marland Kitchen  Lab Results  Component Value Date   WBC 7.9 05/08/2014   HGB 11.7* 05/08/2014   HCT 35.3* 05/08/2014   MCV 88.5 05/08/2014   PLT 244 05/08/2014   Lab Results  Component Value Date   CREATININE 0.80 05/08/2014   BUN 13 05/08/2014   NA 140 05/08/2014   K 3.5* 05/08/2014   CL 103 05/08/2014   CO2 22 05/08/2014    Lab Results  Component Value Date   HGBA1C 5.7* 12/26/2013   Lipid Panel  No results found for: CHOL, TRIG, HDL, CHOLHDL, VLDL, LDLCALC     Assessment and plan:   Angla was seen today for leg swelling.  Diagnoses and associated orders for this visit:  Rheumatoid arthritis - predniSONE (DELTASONE) 10 MG tablet; Take 1 tablet (10 mg total) by mouth daily with breakfast.  6-5-4-3-2-1.  Decrease by one pill per day - traMADol (ULTRAM) 50 MG tablet; Take 1 tablet (50 mg total) by mouth every 8 (eight) hours as needed.  Urinary frequency - Urinalysis Dipstick   Due to language barrier, an interpreter was present during the history-taking and subsequent discussion (and for part of the physical exam) with this patient.  Patient may follow up as needed. She will need to call her Rheumatologist for further instructions on joint pain and medication changes.        Holland Commons, NP-C [facility] [phone] 08/12/2014, 12:44 PM

## 2014-08-13 ENCOUNTER — Ambulatory Visit: Payer: No Typology Code available for payment source | Attending: Internal Medicine

## 2014-08-13 DIAGNOSIS — Z79899 Other long term (current) drug therapy: Secondary | ICD-10-CM

## 2014-08-13 LAB — HEPATIC FUNCTION PANEL
ALK PHOS: 61 U/L (ref 39–117)
ALT: 11 U/L (ref 0–35)
AST: 11 U/L (ref 0–37)
Albumin: 4.4 g/dL (ref 3.5–5.2)
BILIRUBIN DIRECT: 0.1 mg/dL (ref 0.0–0.3)
BILIRUBIN INDIRECT: 0.4 mg/dL (ref 0.2–1.2)
TOTAL PROTEIN: 7.7 g/dL (ref 6.0–8.3)
Total Bilirubin: 0.5 mg/dL (ref 0.2–1.2)

## 2014-09-02 ENCOUNTER — Ambulatory Visit: Payer: No Typology Code available for payment source | Attending: Internal Medicine

## 2014-09-02 ENCOUNTER — Other Ambulatory Visit: Payer: Self-pay | Admitting: Internal Medicine

## 2014-09-02 DIAGNOSIS — M069 Rheumatoid arthritis, unspecified: Secondary | ICD-10-CM

## 2014-09-02 NOTE — Telephone Encounter (Signed)
Reumatology requested blood work and X-ray, order placed. Pt aware, transfer call to front office for lab appointment

## 2014-09-03 ENCOUNTER — Ambulatory Visit (HOSPITAL_COMMUNITY)
Admission: RE | Admit: 2014-09-03 | Discharge: 2014-09-03 | Disposition: A | Discharge status: Home / Self Care | Payer: Self-pay | Source: Ambulatory Visit | Attending: Internal Medicine | Admitting: Internal Medicine

## 2014-09-03 ENCOUNTER — Ambulatory Visit: Payer: No Typology Code available for payment source | Attending: Internal Medicine

## 2014-09-03 DIAGNOSIS — M069 Rheumatoid arthritis, unspecified: Secondary | ICD-10-CM

## 2014-09-03 DIAGNOSIS — Z79899 Other long term (current) drug therapy: Secondary | ICD-10-CM

## 2014-09-03 LAB — CBC WITH DIFFERENTIAL/PLATELET
BASOS PCT: 1 % (ref 0–1)
Basophils Absolute: 0.1 10*3/uL (ref 0.0–0.1)
EOS PCT: 3 % (ref 0–5)
Eosinophils Absolute: 0.2 10*3/uL (ref 0.0–0.7)
HCT: 38.9 % (ref 36.0–46.0)
Hemoglobin: 13.2 g/dL (ref 12.0–15.0)
LYMPHS ABS: 2 10*3/uL (ref 0.7–4.0)
Lymphocytes Relative: 29 % (ref 12–46)
MCH: 29.7 pg (ref 26.0–34.0)
MCHC: 33.9 g/dL (ref 30.0–36.0)
MCV: 87.4 fL (ref 78.0–100.0)
MPV: 10 fL (ref 9.4–12.4)
Monocytes Absolute: 0.5 10*3/uL (ref 0.1–1.0)
Monocytes Relative: 7 % (ref 3–12)
NEUTROS PCT: 60 % (ref 43–77)
Neutro Abs: 4.1 10*3/uL (ref 1.7–7.7)
Platelets: 346 10*3/uL (ref 150–400)
RBC: 4.45 MIL/uL (ref 3.87–5.11)
RDW: 14.5 % (ref 11.5–15.5)
WBC: 6.8 10*3/uL (ref 4.0–10.5)

## 2014-09-03 LAB — CREATININE, SERUM
CREATININE: 0.77 mg/dL (ref 0.50–1.10)
CREATININE: 0.79 mg/dL (ref 0.50–1.10)

## 2014-09-03 LAB — HEPATIC FUNCTION PANEL
ALBUMIN: 4 g/dL (ref 3.5–5.2)
ALK PHOS: 57 U/L (ref 39–117)
ALT: 9 U/L (ref 0–35)
AST: 12 U/L (ref 0–37)
BILIRUBIN DIRECT: 0.2 mg/dL (ref 0.0–0.3)
BILIRUBIN INDIRECT: 0.8 mg/dL (ref 0.2–1.2)
TOTAL PROTEIN: 7.3 g/dL (ref 6.0–8.3)
Total Bilirubin: 1 mg/dL (ref 0.2–1.2)

## 2014-09-03 LAB — C-REACTIVE PROTEIN: CRP: 2.4 mg/dL — ABNORMAL HIGH (ref <0.60)

## 2014-09-04 LAB — SEDIMENTATION RATE: SED RATE: 14 mm/h (ref 0–22)

## 2014-09-16 ENCOUNTER — Telehealth: Payer: Self-pay | Admitting: *Deleted

## 2014-09-16 ENCOUNTER — Other Ambulatory Visit: Payer: Self-pay | Admitting: *Deleted

## 2014-09-16 DIAGNOSIS — F411 Generalized anxiety disorder: Secondary | ICD-10-CM

## 2014-09-16 NOTE — Telephone Encounter (Signed)
Pt aware of lab results, notified results was send to Rheumatologist

## 2014-09-16 NOTE — Telephone Encounter (Signed)
Left voice message with normal lab. Informed results faxed to Rheumatologist Call back for more information ( left voice message in spanish)

## 2014-09-16 NOTE — Telephone Encounter (Signed)
-----   Message from Ambrose Finland, NP sent at 09/11/2014 10:50 AM EST ----- CRP indicating inflammation is elevated.  I have sent results to her Rheumatologist. All other labs are normal

## 2014-09-18 NOTE — Telephone Encounter (Signed)
Please call and find out why the xrays for the patient has not been resulted and posted by a radiologist. Thanks

## 2014-09-19 ENCOUNTER — Other Ambulatory Visit: Payer: Self-pay | Admitting: Internal Medicine

## 2014-09-19 ENCOUNTER — Ambulatory Visit: Payer: Self-pay | Attending: Internal Medicine | Admitting: Internal Medicine

## 2014-09-19 DIAGNOSIS — R112 Nausea with vomiting, unspecified: Secondary | ICD-10-CM

## 2014-09-19 DIAGNOSIS — F419 Anxiety disorder, unspecified: Secondary | ICD-10-CM

## 2014-09-19 DIAGNOSIS — R1011 Right upper quadrant pain: Secondary | ICD-10-CM

## 2014-09-19 MED ORDER — PROMETHAZINE HCL 12.5 MG PO TABS: 12.5000 mg | ORAL_TABLET | Freq: Three times a day (TID) | ORAL | Status: DC | PRN

## 2014-09-19 NOTE — Progress Notes (Signed)
Spoke with patient via [facility], [place], [place] [address] Patient walked in c/o 2 day hx nausea, vomiting after every meal, chills and RUQ pain; rates 7/10 at present Patient reports eating chicken broth, atoles, coffee and gingerale over last 2 days  BP 119/88 P 70 R 20 T98.9 oral SpO2 100%  BS hypoactive RLQ, RUQ and LUQ BS active LLQ  Patient guarding with palpation of LLQ and LUQ quads  PCP in to examine  Per PCP: Phenergan12.5 mg q 8h prn nausea (e-scribed to [facility]) Hida scan  [facility] closed for day. Will schedule hida scan tomorrow and call pt back with date and time of appt

## 2014-09-20 ENCOUNTER — Telehealth: Payer: Self-pay | Admitting: *Deleted

## 2014-09-20 DIAGNOSIS — R1011 Right upper quadrant pain: Secondary | ICD-10-CM

## 2014-09-20 NOTE — Telephone Encounter (Signed)
Appointment on 09/25/2014 at 09:00 arriving early NPO no pain medication 6 hr prior to test  Pt aware of appointment  (information was given in spanish)

## 2014-09-22 ENCOUNTER — Emergency Department (HOSPITAL_COMMUNITY)
Admission: EM | Admit: 2014-09-22 | Discharge: 2014-09-23 | Disposition: A | Discharge status: Home / Self Care | Payer: Self-pay | Attending: Emergency Medicine | Admitting: Emergency Medicine

## 2014-09-22 ENCOUNTER — Encounter (HOSPITAL_COMMUNITY): Payer: Self-pay | Admitting: Emergency Medicine

## 2014-09-22 DIAGNOSIS — R52 Pain, unspecified: Secondary | ICD-10-CM

## 2014-09-22 DIAGNOSIS — R1031 Right lower quadrant pain: Secondary | ICD-10-CM | POA: Insufficient documentation

## 2014-09-22 DIAGNOSIS — Z3202 Encounter for pregnancy test, result negative: Secondary | ICD-10-CM | POA: Insufficient documentation

## 2014-09-22 DIAGNOSIS — R197 Diarrhea, unspecified: Secondary | ICD-10-CM | POA: Insufficient documentation

## 2014-09-22 DIAGNOSIS — R109 Unspecified abdominal pain: Secondary | ICD-10-CM

## 2014-09-22 DIAGNOSIS — Z79899 Other long term (current) drug therapy: Secondary | ICD-10-CM | POA: Insufficient documentation

## 2014-09-22 DIAGNOSIS — I10 Essential (primary) hypertension: Secondary | ICD-10-CM | POA: Insufficient documentation

## 2014-09-22 DIAGNOSIS — M1990 Unspecified osteoarthritis, unspecified site: Secondary | ICD-10-CM | POA: Insufficient documentation

## 2014-09-22 DIAGNOSIS — Z7952 Long term (current) use of systemic steroids: Secondary | ICD-10-CM | POA: Insufficient documentation

## 2014-09-22 DIAGNOSIS — R112 Nausea with vomiting, unspecified: Secondary | ICD-10-CM | POA: Insufficient documentation

## 2014-09-22 NOTE — ED Notes (Signed)
Pt arrived to the ED with a complaint of right lower abdominal pain.  Pt states that she has had abdominal pain, fever for a week.  Pt states for the last three days she has had several episodes of emesis and diarrhea.  Pt states she has attempted to take an antiacid without relief.  Pt is unable to eat or drink and keep it down.

## 2014-09-23 ENCOUNTER — Encounter (HOSPITAL_COMMUNITY): Payer: Self-pay

## 2014-09-23 ENCOUNTER — Emergency Department (HOSPITAL_COMMUNITY): Payer: Self-pay

## 2014-09-23 LAB — BASIC METABOLIC PANEL
Anion gap: 9 (ref 5–15)
BUN: 15 mg/dL (ref 6–23)
CHLORIDE: 108 meq/L (ref 96–112)
CO2: 22 mmol/L (ref 19–32)
CREATININE: 0.77 mg/dL (ref 0.50–1.10)
Calcium: 9.3 mg/dL (ref 8.4–10.5)
GFR calc Af Amer: >90 mL/min (ref >90)
GFR calc non Af Amer: >90 mL/min (ref >90)
GLUCOSE: 89 mg/dL (ref 70–99)
POTASSIUM: 4 mmol/L (ref 3.5–5.1)
SODIUM: 139 mmol/L (ref 135–145)

## 2014-09-23 LAB — URINALYSIS, ROUTINE W REFLEX MICROSCOPIC
Bilirubin Urine: NEGATIVE
Glucose, UA: NEGATIVE mg/dL
HGB URINE DIPSTICK: NEGATIVE
KETONES UR: NEGATIVE mg/dL
Leukocytes, UA: NEGATIVE
NITRITE: NEGATIVE
PROTEIN: NEGATIVE mg/dL
SPECIFIC GRAVITY, URINE: 1.013 (ref 1.005–1.030)
Urobilinogen, UA: 0.2 mg/dL (ref 0.0–1.0)
pH: 5.5 (ref 5.0–8.0)

## 2014-09-23 LAB — CBC WITH DIFFERENTIAL/PLATELET
BASOS PCT: 0 % (ref 0–1)
Basophils Absolute: 0 10*3/uL (ref 0.0–0.1)
EOS PCT: 3 % (ref 0–5)
Eosinophils Absolute: 0.2 10*3/uL (ref 0.0–0.7)
HCT: 38.3 % (ref 36.0–46.0)
Hemoglobin: 12.1 g/dL (ref 12.0–15.0)
LYMPHS ABS: 2.5 10*3/uL (ref 0.7–4.0)
LYMPHS PCT: 33 % (ref 12–46)
MCH: 28.6 pg (ref 26.0–34.0)
MCHC: 31.6 g/dL (ref 30.0–36.0)
MCV: 90.5 fL (ref 78.0–100.0)
MONOS PCT: 8 % (ref 3–12)
Monocytes Absolute: 0.6 10*3/uL (ref 0.1–1.0)
NEUTROS ABS: 4.4 10*3/uL (ref 1.7–7.7)
Neutrophils Relative %: 56 % (ref 43–77)
PLATELETS: 310 10*3/uL (ref 150–400)
RBC: 4.23 MIL/uL (ref 3.87–5.11)
RDW: 14 % (ref 11.5–15.5)
WBC: 7.8 10*3/uL (ref 4.0–10.5)

## 2014-09-23 LAB — WET PREP, GENITAL
Clue Cells Wet Prep HPF POC: NONE SEEN
Trich, Wet Prep: NONE SEEN
Yeast Wet Prep HPF POC: NONE SEEN

## 2014-09-23 LAB — PREGNANCY, URINE: Preg Test, Ur: NEGATIVE

## 2014-09-23 MED ORDER — FENTANYL CITRATE 0.05 MG/ML IJ SOLN
100.0000 ug | Freq: Once | INTRAMUSCULAR | Status: AC
  Administered 2014-09-23: 100 ug via INTRAVENOUS
  Filled 2014-09-23: qty 2

## 2014-09-23 MED ORDER — SODIUM CHLORIDE 0.9 % IV BOLUS (SEPSIS)
1000.0000 mL | Freq: Once | INTRAVENOUS | Status: AC
  Administered 2014-09-23: 1000 mL via INTRAVENOUS

## 2014-09-23 MED ORDER — ONDANSETRON HCL 4 MG/2ML IJ SOLN
4.0000 mg | Freq: Once | INTRAMUSCULAR | Status: AC
  Administered 2014-09-23: 4 mg via INTRAVENOUS
  Filled 2014-09-23: qty 2

## 2014-09-23 MED ORDER — IOHEXOL 300 MG/ML  SOLN
100.0000 mL | Freq: Once | INTRAMUSCULAR | Status: AC | PRN
  Administered 2014-09-23: 100 mL via INTRAVENOUS

## 2014-09-23 MED ORDER — IOHEXOL 300 MG/ML  SOLN
50.0000 mL | Freq: Once | INTRAMUSCULAR | Status: AC | PRN
  Administered 2014-09-23: 50 mL via ORAL

## 2014-09-23 MED ORDER — ONDANSETRON HCL 8 MG PO TABS: 8.0000 mg | ORAL_TABLET | Freq: Three times a day (TID) | ORAL | Status: DC | PRN

## 2014-09-23 NOTE — ED Provider Notes (Signed)
CSN: [id number]     Arrival date & time 09/22/14  2210 History   First MD Initiated Contact with Patient 09/23/14 0048     Chief Complaint  Patient presents with  . Abdominal Pain     (Consider location/radiation/quality/duration/timing/severity/associated sxs/prior Treatment) HPI  This is a 34 year old female with a three-day history of abdominal pain located in the right lower quadrant. The pain has come on gradually. She rates the pain as a 9 out of 10. It is worse with movement or palpation. She has also had nausea, vomiting and diarrhea for 3 days. She states she has been unable to keep anything down. She has had fever to 101 as well as chills. She denies dysuria, vaginal bleeding or vaginal discharge. She has taken antacids without relief.  She is being followed by Dr. Dulce Sellar of gastroenterology. She has had previous ultrasound of the gallbladder and hepatobiliary nuclear medicine scan that showed normal gallbladder and normal gallbladder function. She has a repeat hepatobiliary nuclear medicine scan scheduled for the day after tomorrow. She has Phenergan at home which she states is not adequately treated her nausea.   Past Medical History  Diagnosis Date  . Hypertension   . Arthritis    History reviewed. No pertinent past surgical history. Family History  Problem Relation Age of Onset  . Diabetes Mother   . Hypertension Mother    History  Substance Use Topics  . Smoking status: Never Smoker   . Smokeless tobacco: Never Used  . Alcohol Use: No   OB History    No data available     Review of Systems  All other systems reviewed and are negative.   Allergies  Review of patient's allergies indicates no known allergies.  Home Medications   Prior to Admission medications   Medication Sig Start Date End Date Taking? Authorizing Provider  escitalopram (LEXAPRO) 10 MG tablet Take 1 tablet (10 mg total) by mouth daily. 06/28/14  Yes Ambrose Finland, NP  folic acid  (FOLVITE) 1 MG tablet Take 1 mg by mouth daily.   Yes Historical Provider, MD  LORazepam (ATIVAN) 0.5 MG tablet Take 1 tablet (0.5 mg total) by mouth daily as needed for anxiety. 06/28/14  Yes Ambrose Finland, NP  promethazine (PHENERGAN) 12.5 MG tablet Take 1 tablet (12.5 mg total) by mouth every 8 (eight) hours as needed for nausea or vomiting. 09/19/14  Yes Ambrose Finland, NP  naproxen sodium (ANAPROX) 220 MG tablet Take 220 mg by mouth 2 (two) times daily as needed (for pain).    Historical Provider, MD  omeprazole (PRILOSEC) 20 MG capsule Take 1 capsule (20 mg total) by mouth daily. Patient not taking: Reported on 09/23/2014 07/17/14   Ambrose Finland, NP  ondansetron (ZOFRAN ODT) 4 MG disintegrating tablet Take 1 tablet (4 mg total) by mouth every 8 (eight) hours as needed for nausea or vomiting. Patient not taking: Reported on 09/23/2014 07/17/14   Ambrose Finland, NP  predniSONE (DELTASONE) 10 MG tablet Take 1 tablet (10 mg total) by mouth daily with breakfast. 6-5-4-3-2-1.  Decrease by one pill per day Patient not taking: Reported on 09/19/2014 08/12/14   Ambrose Finland, NP  sulfaSALAzine (AZULFIDINE) 500 MG tablet Take 1,000 mg by mouth daily.    Historical Provider, MD  traMADol (ULTRAM) 50 MG tablet Take 1 tablet (50 mg total) by mouth every 8 (eight) hours as needed. Patient not taking: Reported on 09/23/2014 08/12/14   Ambrose Finland, NP  BP 123/68 mmHg  Pulse 86  Temp(Src) 98.1 F (36.7 C) (Oral)  Resp 21  Ht 5\' 3"  (1.6 m)  Wt 233 lb 3.2 oz (105.779 kg)  BMI 41.32 kg/m2  SpO2 100%  LMP 08/29/2014   Physical Exam  General: Well-developed, well-nourished female in no acute distress; appearance consistent with age of record HENT: normocephalic; atraumatic Eyes: pupils equal, round and reactive to light; extraocular muscles intact Neck: supple Heart: regular rate and rhythm Lungs: clear to auscultation bilaterally Abdomen: soft; nondistended; right lower quadrant tenderness; no masses  or hepatosplenomegaly; bowel sounds present GU: Normal external genitalia; mucoid vaginal discharge; no vaginal bleeding; no cervical motion tenderness; no adnexal tenderness Extremities: No deformity; full range of motion; pulses normal Neurologic: Awake, alert and oriented; motor function intact in all extremities and symmetric; no facial droop Skin: Warm and dry Psychiatric: Normal mood and affect    ED Course  Procedures (including critical care time)   MDM   Nursing notes and vitals signs, including pulse oximetry, reviewed.  Summary of this visit's results, reviewed by myself:  Labs:  Results for orders placed or performed during the hospital encounter of 09/22/14 (from the past 24 hour(s))  CBC with Differential     Status: None   Collection Time: 09/23/14  1:18 AM  Result Value Ref Range   WBC 7.8 4.0 - 10.5 K/uL   RBC 4.23 3.87 - 5.11 MIL/uL   Hemoglobin 12.1 12.0 - 15.0 g/dL   HCT 11/22/14 56.2 - 13.0 %   MCV 90.5 78.0 - 100.0 fL   MCH 28.6 26.0 - 34.0 pg   MCHC 31.6 30.0 - 36.0 g/dL   RDW 86.5 78.4 - 69.6 %   Platelets 310 150 - 400 K/uL   Neutrophils Relative % 56 43 - 77 %   Neutro Abs 4.4 1.7 - 7.7 K/uL   Lymphocytes Relative 33 12 - 46 %   Lymphs Abs 2.5 0.7 - 4.0 K/uL   Monocytes Relative 8 3 - 12 %   Monocytes Absolute 0.6 0.1 - 1.0 K/uL   Eosinophils Relative 3 0 - 5 %   Eosinophils Absolute 0.2 0.0 - 0.7 K/uL   Basophils Relative 0 0 - 1 %   Basophils Absolute 0.0 0.0 - 0.1 K/uL  Basic metabolic panel     Status: None   Collection Time: 09/23/14  1:18 AM  Result Value Ref Range   Sodium 139 135 - 145 mmol/L   Potassium 4.0 3.5 - 5.1 mmol/L   Chloride 108 96 - 112 mEq/L   CO2 22 19 - 32 mmol/L   Glucose, Bld 89 70 - 99 mg/dL   BUN 15 6 - 23 mg/dL   Creatinine, Ser 11/22/14 0.50 - 1.10 mg/dL   Calcium 9.3 8.4 - 2.84 mg/dL   GFR calc non Af Amer >90 >90 mL/min   GFR calc Af Amer >90 >90 mL/min   Anion gap 9 5 - 15  Wet prep, genital     Status:  Abnormal   Collection Time: 09/23/14  1:29 AM  Result Value Ref Range   Yeast Wet Prep HPF POC NONE SEEN NONE SEEN   Trich, Wet Prep NONE SEEN NONE SEEN   Clue Cells Wet Prep HPF POC NONE SEEN NONE SEEN   WBC, Wet Prep HPF POC TOO NUMEROUS TO COUNT (A) NONE SEEN  Urinalysis, Routine w reflex microscopic     Status: None   Collection Time: 09/23/14  2:02 AM  Result Value  Ref Range   Color, Urine YELLOW YELLOW   APPearance CLEAR CLEAR   Specific Gravity, Urine 1.013 1.005 - 1.030   pH 5.5 5.0 - 8.0   Glucose, UA NEGATIVE NEGATIVE mg/dL   Hgb urine dipstick NEGATIVE NEGATIVE   Bilirubin Urine NEGATIVE NEGATIVE   Ketones, ur NEGATIVE NEGATIVE mg/dL   Protein, ur NEGATIVE NEGATIVE mg/dL   Urobilinogen, UA 0.2 0.0 - 1.0 mg/dL   Nitrite NEGATIVE NEGATIVE   Leukocytes, UA NEGATIVE NEGATIVE  Pregnancy, urine     Status: None   Collection Time: 09/23/14  2:02 AM  Result Value Ref Range   Preg Test, Ur NEGATIVE NEGATIVE    Imaging Studies: Ct Abdomen Pelvis W Contrast  09/23/2014   CLINICAL DATA:  Acute onset of right lower quadrant abdominal pain and fever for 1 week. Vomiting and diarrhea. Initial encounter.  EXAM: CT ABDOMEN AND PELVIS WITH CONTRAST  TECHNIQUE: Multidetector CT imaging of the abdomen and pelvis was performed using the standard protocol following bolus administration of intravenous contrast.  CONTRAST:  OMNIPAQUE IOHEXOL 300 MG/ML  SOLN  COMPARISON:  CT of the abdomen and pelvis from 02/12/2014  FINDINGS: The visualized lung bases are clear.  The liver and spleen are unremarkable in appearance. The gallbladder is within normal limits. The pancreas and adrenal glands are unremarkable.  The kidneys are unremarkable in appearance. There is no evidence of hydronephrosis. No renal or ureteral stones are seen. No perinephric stranding is appreciated.  No free fluid is identified. The small bowel is unremarkable in appearance. The stomach is within normal limits. No acute  vascular abnormalities are seen.  The appendix is normal in caliber and contains air, without evidence for appendicitis. Contrast progresses to the level of the sigmoid colon. The colon is unremarkable in appearance.  The bladder is mildly distended and grossly unremarkable. The uterus is grossly unremarkable in appearance. The ovaries are relatively symmetric. No suspicious adnexal masses are seen. No inguinal lymphadenopathy is seen.  No acute osseous abnormalities are identified. Prominent benign-appearing sclerosis is seen at the sacroiliac joints.  IMPRESSION: No acute abnormality seen within the abdomen or pelvis.   Electronically Signed   By: Roanna Raider M.D.   On: 09/23/2014 04:10   4:49 AM Patient's abdomen is soft with right-sided tenderness. Patient was advised of her unremarkable lab and CT findings. She has not gotten adequate relief with Phenergan tablets so we will try ODT Zofran. She was advised that we would like to avoid narcotics as these can cause constipation and make abdominal pain worse.    Hanley Seamen, MD 09/23/14 [id number]

## 2014-09-23 NOTE — ED Notes (Signed)
Pt aware a urine sample is needed pt sts she is unable to urinate at this time.

## 2014-09-23 NOTE — Discharge Instructions (Signed)
Dolor abdominal °(Abdominal Pain) °El dolor puede tener muchas causas. Normalmente la causa del dolor abdominal no es una enfermedad y mejorará sin tratamiento. Frecuentemente puede controlarse y tratarse en casa. Su médico le realizará un examen físico y posiblemente solicite análisis de sangre y radiografías para ayudar a determinar la gravedad de su dolor. Sin embargo, en muchos casos, debe transcurrir más tiempo antes de que se pueda encontrar una causa evidente del dolor. Antes de llegar a ese punto, es posible que su médico no sepa si necesita más pruebas o un tratamiento más profundo. °INSTRUCCIONES PARA EL CUIDADO EN EL HOGAR  °Esté atento al dolor para ver si hay cambios. Las siguientes indicaciones ayudarán a aliviar cualquier molestia que pueda sentir: °· Tome solo medicamentos de venta libre o recetados, según las indicaciones del médico. °· No tome laxantes a menos que se lo haya indicado su médico. °· Pruebe con una dieta líquida absoluta (caldo, té o agua) según se lo indique su médico. Introduzca gradualmente una dieta normal, según su tolerancia. °SOLICITE ATENCIÓN MÉDICA SI: °· Tiene dolor abdominal sin explicación. °· Tiene dolor abdominal relacionado con náuseas o diarrea. °· Tiene dolor cuando orina o defeca. °· Experimenta dolor abdominal que lo despierta de noche. °· Tiene dolor abdominal que empeora o mejora cuando come alimentos. °· Tiene dolor abdominal que empeora cuando come alimentos grasosos. °· Tiene fiebre. °SOLICITE ATENCIÓN MÉDICA DE INMEDIATO SI:  °· El dolor no desaparece en un plazo máximo de 2 horas. °· No deja de (vomitar). °· El dolor se siente solo en partes del abdomen, como el lado derecho o la parte inferior izquierda del abdomen. °· Evacúa materia fecal sanguinolenta o negra, de aspecto alquitranado. °ASEGÚRESE DE QUE: °· Comprende estas instrucciones. °· Controlará su afección. °· Recibirá ayuda de inmediato si no mejora o si empeora. °Document Released: 08/30/2005  Document Revised: 09/04/2013 °ExitCare® Patient Information ©2015 [employer]. This information is not intended to replace advice given to you by your health care provider. Make sure you discuss any questions you have with your health care provider. ° °

## 2014-09-23 NOTE — ED Notes (Signed)
Patient transported to CT 

## 2014-09-23 NOTE — ED Notes (Signed)
Ambulated to restroom with assistance of one family member.

## 2014-09-24 LAB — GC/CHLAMYDIA PROBE AMP
CT Probe RNA: NEGATIVE
GC PROBE AMP APTIMA: NEGATIVE

## 2014-09-25 ENCOUNTER — Ambulatory Visit (HOSPITAL_COMMUNITY)
Admission: RE | Admit: 2014-09-25 | Discharge: 2014-09-25 | Disposition: A | Discharge status: Home / Self Care | Payer: Self-pay | Source: Ambulatory Visit | Attending: Internal Medicine | Admitting: Internal Medicine

## 2014-09-25 DIAGNOSIS — R1011 Right upper quadrant pain: Secondary | ICD-10-CM | POA: Insufficient documentation

## 2014-09-25 MED ORDER — SINCALIDE 5 MCG IJ SOLR
0.0200 ug/kg | Freq: Once | INTRAMUSCULAR | Status: AC
  Administered 2014-09-25: 5.3 ug via INTRAVENOUS

## 2014-09-25 MED ORDER — STERILE WATER FOR INJECTION IJ SOLN
INTRAMUSCULAR | Status: AC
  Administered 2014-09-25: 10:00:00
  Filled 2014-09-25: qty 10

## 2014-09-25 MED ORDER — SINCALIDE 5 MCG IJ SOLR
INTRAMUSCULAR | Status: AC
  Filled 2014-09-25: qty 5

## 2014-09-25 MED ORDER — TECHNETIUM TC 99M MEBROFENIN IV KIT: 5.0000 | PACK | Freq: Once | INTRAVENOUS | Status: AC | PRN

## 2014-10-02 ENCOUNTER — Telehealth: Payer: Self-pay | Admitting: *Deleted

## 2014-10-02 NOTE — Telephone Encounter (Signed)
-----   Message from Ambrose Finland, NP sent at 10/01/2014 11:30 PM EST ----- Sclerosis of SI joint. Please print results and fax results to her Rheumatologist, I will give you fax number when you show it to me. Thanks

## 2014-10-02 NOTE — Telephone Encounter (Signed)
Pt aware, results printed and fax to rheumatology

## 2014-10-08 ENCOUNTER — Telehealth: Payer: Self-pay | Admitting: *Deleted

## 2014-10-08 NOTE — Telephone Encounter (Signed)
-----   Message from Ambrose Finland, NP sent at 10/01/2014 10:49 PM EST ----- Normal CT

## 2014-10-08 NOTE — Telephone Encounter (Signed)
Left voice message with normal CT (voice message left in Spanish)

## 2014-10-10 ENCOUNTER — Other Ambulatory Visit: Payer: Self-pay | Admitting: Internal Medicine

## 2014-10-10 MED ORDER — PREDNISONE (PAK) 10 MG PO TABS: ORAL_TABLET | Freq: Every day | ORAL | Status: DC

## 2014-10-21 ENCOUNTER — Encounter: Payer: Self-pay | Admitting: Internal Medicine

## 2014-10-21 ENCOUNTER — Ambulatory Visit: Payer: Self-pay | Attending: Internal Medicine | Admitting: Internal Medicine

## 2014-10-21 VITALS — BP 134/85 | HR 79 | Temp 98.1°F | Resp 16 | Ht 63.0 in | Wt 230.0 lb

## 2014-10-21 DIAGNOSIS — M069 Rheumatoid arthritis, unspecified: Secondary | ICD-10-CM | POA: Insufficient documentation

## 2014-10-21 DIAGNOSIS — J029 Acute pharyngitis, unspecified: Secondary | ICD-10-CM | POA: Insufficient documentation

## 2014-10-21 DIAGNOSIS — Z79899 Other long term (current) drug therapy: Secondary | ICD-10-CM | POA: Insufficient documentation

## 2014-10-21 LAB — POCT RAPID STREP A (OFFICE): Rapid Strep A Screen: NEGATIVE

## 2014-10-21 MED ORDER — TRAMADOL HCL 50 MG PO TABS: 50.0000 mg | ORAL_TABLET | Freq: Two times a day (BID) | ORAL | Status: DC | PRN

## 2014-10-21 MED ORDER — DICLOFENAC SODIUM 75 MG PO TBEC: 75.0000 mg | DELAYED_RELEASE_TABLET | Freq: Two times a day (BID) | ORAL | Status: DC

## 2014-10-21 NOTE — Patient Instructions (Signed)
Artritis reumatoidea (Rheumatoid Arthritis) La artritis reumatoidea es una enfermedad a largo plazo (crnica) inflamatoria que causa dolor, hinchazn y entumecimiento de las articulaciones. Puede afectar a todo el organismo, incluidos los ojos y los pulmones. Los efectos varan en gran medida entre los que sufren esta enfermedad. CAUSAS  La causa de esta enfermedad no se conoce. Suele ser hereditaria y es ms frecuente entre mujeres. Ciertas clulas del sistema de defensa natural del organismo (sistema inmunolgico) no funcionan adecuadamente y comienzan a atacar las articulaciones sanas. En principio involucra al tejido conjuntivo que alinea las articulaciones (membrana sinovial). Esto puede causar una lesin en la articulacin. SNTOMAS   Dolor, entumecimiento, hinchazn y disminucin de la movilidad en varias articulaciones, especialmente en las manos y los pies.  El entumecimiento empeora por la maana. Puede durar entre 1 y 2 horas, o ms.  Adormecimiento u hormigueo en las manos.  Fatiga.  Prdida del apetito.  Prdida de peso.  Fiebre no muy elevada.  Ojos y boca secos.  Bultos firmes (ndulos reumatoideos) que crecen por debajo de la piel en zonas como codos y manos. DIAGNSTICO  El diagnstico se realiza segn los sntomas descriptos, un examen y anlisis de sangre. Algunas veces es necesario tomar radiografas. TRATAMIENTO  Los objetivos del tratamiento son aliviar el dolor, reducir la inflamacin y disminuir o detener la lesin en las articulaciones y la discapacidad. Los mtodos varan y pueden ser:  Mantener un equilibrio entre reposo, actividad fsica y nutricin adecuada.  Medicamentos:  Analgsicos.  Corticoides y antiinflamatorios no esteroides para reducir la inflamacin.  Antirreumticos modificadores de la enfermedad (ARME) para disminuir el avance de la artritis reumatoidea.  Modificadores de respuesta biolgica para reducir la inflamacin y las  lesiones.  Fisioterapia y terapia ocupacional.  Ciruga en aquellos pacientes con lesiones graves en las articulaciones. Puede ser necesario el reemplazo o fusin de las articulaciones.  Los controles de rutina y el cuidado continuo, como visitas al consultorio, anlisis de sangre y orina, y radiografas. INSTRUCCIONES PARA EL CUIDADO EN EL HOGAR   Mantngase fsicamente activo y reduzca la actividad cuando la enfermedad empeora.  Consuma una dieta bien balanceada.  Aplquese calor en las articulaciones afectadas al despertarse y antes de comenzar las actividades. Mantenga el calor sobre la articulacin afectada durante el tiempo que le indique su mdico.  Aplquese hielo en las articulaciones afectadas luego de realizar actividades o de practicar ejercicios.  Ponga el hielo en una bolsa plstica.  Coloque una toalla entre la piel y la bolsa de hielo.  Deje el hielo durante 15 a 20minutos, 3 a 4veces por da, o segn las indicaciones del mdico.  Tome los medicamentos y los suplementos solamente como se lo haya indicado el mdico.  Use las frulas segn las indicaciones de su mdico. Las frulas mantienen la funcin y la posicin de las articulaciones.  No duerma con almohadas debajo de las rodillas. Esto puede causarle espasmos.  Participe en un programa de autoayuda para mantenerse actualizado con los ltimos tratamientos y modos de enfrentar la enfermedad. SOLICITE ATENCIN MDICA DE INMEDIATO SI:  Sufre un desmayo.  Por momentos, siente debilidad extrema.  Desarrolla rpidamente calor y dolor en una articulacin que son ms intensos que los dolores articulares habituales.  Tiene escalofros.  Tiene fiebre. PARA OBTENER MS INFORMACIN   Instituto Norteameric[employer] de Reumatologa (American College of Rheumatology): www.rheumatology.org.  Fundacin contra la Artritis (Arthritis Foundation): www.arthritis.org Document Released: 06/09/2005 Document Revised:  01/14/2014 ExitCare Patient Information 2015 [employer]. This information is   not intended to replace advice given to you by your health care provider. Make sure you discuss any questions you have with your health care provider.  

## 2014-10-21 NOTE — Progress Notes (Signed)
Patient ID: Karina Murphy, female   DOB: 1981/04/29, 34 y.o.   MRN: [id number]  CC: RA flare  HPI: Karina Murphy is a 34 y.o. female here today for a follow up visit.  Patient has past medical history of RA.  She presents today with c/o of sharp pains all over her body that prevent her from moving for the past 8 days.  She states that the pain is more severe in her lower back and right knee.  Patient reports that she explained to her Rheumatologist that she continues to have frequent flares of her RA and was told to stay on the same medication and that she cannot take steroids long term.  She states that the pain becomes unbearable at times.  She is concerned about something else being wrong with her.   Right side throat pain since yesterday. Hard to swollen, feels like throwing up. Sunday passed dark vomit and believes she hurt her throat when vomiting.   Patient has No headache, No chest pain, No abdominal pain - No Nausea, No new weakness tingling or numbness, No Cough - SOB.  No Known Allergies Past Medical History  Diagnosis Date  . Hypertension   . Arthritis    Current Outpatient Prescriptions on File Prior to Visit  Medication Sig Dispense Refill  . folic acid (FOLVITE) 1 MG tablet Take 1 mg by mouth daily.    . naproxen sodium (ANAPROX) 220 MG tablet Take 220 mg by mouth 2 (two) times daily as needed (for pain).    . ondansetron (ZOFRAN) 8 MG tablet Take 1 tablet (8 mg total) by mouth every 8 (eight) hours as needed for nausea. 10 tablet 0  . escitalopram (LEXAPRO) 10 MG tablet Take 1 tablet (10 mg total) by mouth daily. (Patient not taking: Reported on 10/21/2014) 30 tablet 3  . LORazepam (ATIVAN) 0.5 MG tablet Take 1 tablet (0.5 mg total) by mouth daily as needed for anxiety. (Patient not taking: Reported on 10/21/2014) 20 tablet 1  . predniSONE (STERAPRED UNI-PAK) 10 MG tablet Take by mouth daily. 6-5-4-3-2-1. Take 6 pills today and decrease by one tablet per day (Patient  not taking: Reported on 10/21/2014) 21 tablet 0  . promethazine (PHENERGAN) 12.5 MG tablet Take 1 tablet (12.5 mg total) by mouth every 8 (eight) hours as needed for nausea or vomiting. (Patient not taking: Reported on 10/21/2014) 30 tablet 1  . [DISCONTINUED] sulfaSALAzine (AZULFIDINE) 500 MG tablet Take 1,000 mg by mouth daily.     No current facility-administered medications on file prior to visit.   Family History  Problem Relation Age of Onset  . Diabetes Mother   . Hypertension Mother    History   Social History  . Marital Status: Single    Spouse Name: N/A    Number of Children: N/A  . Years of Education: N/A   Occupational History  . Not on file.   Social History Main Topics  . Smoking status: Never Smoker   . Smokeless tobacco: Never Used  . Alcohol Use: No  . Drug Use: No  . Sexual Activity: Yes    Birth Control/ Protection: Condom   Other Topics Concern  . Not on file   Social History Narrative    Review of Systems: See HPI   Objective:   Filed Vitals:   10/21/14 1638  BP: 134/85  Pulse: 79  Temp: 98.1 F (36.7 C)  Resp: 16    Physical Exam  Constitutional: She is oriented to person,  place, and time.  HENT:  Right Ear: External ear normal.  Left Ear: External ear normal.  Mouth/Throat: Oropharynx is clear and moist.  Eyes:  Red sclera  Cardiovascular: Normal rate, regular rhythm and normal heart sounds.   Pulmonary/Chest: Effort normal and breath sounds normal.  Musculoskeletal: She exhibits tenderness. She exhibits no edema.  Multiple swollen joints in bilateral hands  Neurological: She is alert and oriented to person, place, and time.  Skin: Skin is warm and dry. No erythema.  Psychiatric: She has a normal mood and affect.     Lab Results  Component Value Date   WBC 7.8 09/23/2014   HGB 12.1 09/23/2014   HCT 38.3 09/23/2014   MCV 90.5 09/23/2014   PLT 310 09/23/2014   Lab Results  Component Value Date   CREATININE 0.77 09/23/2014    BUN 15 09/23/2014   NA 139 09/23/2014   K 4.0 09/23/2014   CL 108 09/23/2014   CO2 22 09/23/2014    Lab Results  Component Value Date   HGBA1C 5.7* 12/26/2013   Lipid Panel  No results found for: CHOL, TRIG, HDL, CHOLHDL, VLDL, LDLCALC     Assessment and plan:   Karina Murphy was seen today for follow-up.  Diagnoses and all orders for this visit:  Rheumatoid arthritis flare Orders: -     Begin diclofenac (VOLTAREN) 75 MG EC tablet; Take 1 tablet (75 mg total) by mouth 2 (two) times daily. Explained that she should not take any ibuprofen, aleve, or advil type products in conjunction with diclofenac.  -     traMADol (ULTRAM) 50 MG tablet; Take 1 tablet (50 mg total) by mouth every 12 (twelve) hours as needed for severe pain.  Sore throat Orders: -     Rapid Strep A   Due to language barrier, an interpreter was present during the history-taking and subsequent discussion (and for part of the physical exam) with this patient.  Return if symptoms worsen or fail to improve.       Holland Commons, NP-C [facility] [phone] 10/21/2014, 5:21 PM

## 2014-10-21 NOTE — Progress Notes (Signed)
Pt said that last Saturday she had sharp pains all over her body to where she couldn't move. Pt has severe pain in her lower back and right knee. Pt states that the right side of her throat hurts for about 2 days. Pt has an interpreter.

## 2014-11-11 ENCOUNTER — Telehealth: Payer: Self-pay | Admitting: *Deleted

## 2014-11-11 ENCOUNTER — Telehealth: Payer: Self-pay | Admitting: Internal Medicine

## 2014-11-11 NOTE — Telephone Encounter (Signed)
Left message to return call    Please call patient and find out what is going on, saw that she has a appointment scheduled later this week. Hopefully we can resolve the issue and she will not need the appointment.   I have given her all the pain medication that I can.

## 2014-11-11 NOTE — Telephone Encounter (Signed)
Patient called nurse back, please f/u with pt.

## 2014-11-11 NOTE — Telephone Encounter (Signed)
Pt stated has schedule appointment due to low back pain, burning with urination, frequency urination. Advised to drink plenty of water.

## 2014-11-13 ENCOUNTER — Other Ambulatory Visit: Payer: Self-pay | Admitting: Internal Medicine

## 2014-11-13 DIAGNOSIS — F411 Generalized anxiety disorder: Secondary | ICD-10-CM

## 2014-11-13 MED ORDER — ESCITALOPRAM OXALATE 10 MG PO TABS: 10.0000 mg | ORAL_TABLET | Freq: Every day | ORAL | Status: DC

## 2014-11-13 MED ORDER — LORAZEPAM 0.5 MG PO TABS: 0.5000 mg | ORAL_TABLET | Freq: Every day | ORAL | Status: DC | PRN

## 2014-11-14 ENCOUNTER — Ambulatory Visit: Payer: Self-pay | Admitting: Internal Medicine

## 2014-12-03 ENCOUNTER — Telehealth: Payer: Self-pay | Admitting: Internal Medicine

## 2014-12-03 NOTE — Telephone Encounter (Signed)
Pt called requesting lab work orders to be ordered by rheumatologist. Please f/u with pt

## 2014-12-03 NOTE — Progress Notes (Signed)
I agree with RN's assessment and intervention per my guidance.   

## 2014-12-05 NOTE — Progress Notes (Signed)
I agree with RN's assessment and intervention per my guidance.   

## 2014-12-16 ENCOUNTER — Telehealth: Payer: Self-pay | Admitting: Internal Medicine

## 2014-12-16 NOTE — Telephone Encounter (Signed)
Pt called requesting lab work orders to be ordered by rheumatologist. Please f/u with pt °

## 2014-12-26 ENCOUNTER — Ambulatory Visit: Payer: Self-pay | Attending: Internal Medicine | Admitting: Physician Assistant

## 2014-12-26 VITALS — BP 126/82 | HR 101 | Temp 98.6°F | Resp 16

## 2014-12-26 DIAGNOSIS — B349 Viral infection, unspecified: Secondary | ICD-10-CM

## 2014-12-26 DIAGNOSIS — R350 Frequency of micturition: Secondary | ICD-10-CM

## 2014-12-26 LAB — POCT URINALYSIS DIPSTICK
Glucose, UA: NEGATIVE
Ketones, UA: 40
Nitrite, UA: NEGATIVE
PROTEIN UA: 30
SPEC GRAV UA: 1.025
Urobilinogen, UA: 1
pH, UA: 6

## 2014-12-26 MED ORDER — DIPHENOXYLATE-ATROPINE 2.5-0.025 MG PO TABS: 1.0000 | ORAL_TABLET | Freq: Four times a day (QID) | ORAL | Status: DC | PRN

## 2014-12-26 MED ORDER — MECLIZINE HCL 25 MG PO TABS: 25.0000 mg | ORAL_TABLET | Freq: Three times a day (TID) | ORAL | Status: DC | PRN

## 2014-12-26 MED ORDER — PROMETHAZINE HCL 12.5 MG PO TABS: 12.5000 mg | ORAL_TABLET | Freq: Four times a day (QID) | ORAL | Status: DC | PRN

## 2014-12-26 NOTE — Progress Notes (Signed)
Complaining of vomiting x 2 day Unable to keep anything  Dizziness and back pain

## 2014-12-26 NOTE — Progress Notes (Signed)
Chief Complaint: Nausea, vomiting, dizziness  Subjective: This is a 34 year old female who has rheumatoid arthritis. About 5 weeks ago she was started on methotrexate. For the last 2 weeks she's been having intermittent dizziness, vomiting, and diarrhea. She also has some abdominal pain that sometimes radiates to the right back. She denies burning on urination. She denies blood in her urine. She denies nocturia. She denies frequency. She has been taking some herbs over-the-counter since her symptoms started.  The first day of her last Mr. Marina Goodell was 12/17/2014. She states there is no possibility that she could be pregnant. She has not had sex in 5 years.  She further states that she's been dizzy for the last several days as well. She feels like she is floating. The room is spinning at times. She does have a headache for the last 2 days as well.   ROS:  GEN: denies fever or chills, denies change in weight Skin: denies lesions or rashes HEENT: denies headache, earache, epistaxis, sore throat, or neck pain ABD: + abd pain, N and V NEURO: denies numbness or tingling, denies sz, stroke or TIA   Objective:  Filed Vitals:   12/26/14 1023  BP: 126/82  Pulse: 101  Temp: 98.6 F (37 C)  TempSrc: Oral  Resp: 16  SpO2: 98%    Physical Exam:  General: in no acute distress. HEENT: no pallor, no icterus, moist oral mucosa, no JVD, no lymphadenopathy Heart: Normal  s1 &s2  Regular rate and rhythm, without murmurs, rubs, gallops. Lungs: Clear to auscultation bilaterally. Abdomen: Soft, nontender, nondistended, hypoactive bowel sounds. Neuro: Alert, awake, oriented x3, nonfocal.  Pertinent Lab Results:urine dipstick relatively benign   Medications: Prior to Admission medications   Medication Sig Start Date End Date Taking? Authorizing Provider  diclofenac (VOLTAREN) 75 MG EC tablet Take 1 tablet (75 mg total) by mouth 2 (two) times daily. 10/21/14  Yes Ambrose Finland, NP  escitalopram  (LEXAPRO) 10 MG tablet Take 1 tablet (10 mg total) by mouth daily. 11/13/14  Yes Ambrose Finland, NP  folic acid (FOLVITE) 1 MG tablet Take 1 mg by mouth daily.   Yes Historical Provider, MD  LORazepam (ATIVAN) 0.5 MG tablet Take 1 tablet (0.5 mg total) by mouth daily as needed for anxiety. 11/13/14  Yes Ambrose Finland, NP  predniSONE (STERAPRED UNI-PAK) 10 MG tablet Take by mouth daily. 6-5-4-3-2-1. Take 6 pills today and decrease by one tablet per day 10/10/14  Yes Ambrose Finland, NP  diphenoxylate-atropine (LOMOTIL) 2.5-0.025 MG per tablet Take 1 tablet by mouth 4 (four) times daily as needed for diarrhea or loose stools. 12/26/14   Rastus Borton Netta Cedars, PA-C  meclizine (ANTIVERT) 25 MG tablet Take 1 tablet (25 mg total) by mouth 3 (three) times daily as needed for dizziness or nausea. 12/26/14   Mahnoor Mathisen Netta Cedars, PA-C  naproxen sodium (ANAPROX) 220 MG tablet Take 220 mg by mouth 2 (two) times daily as needed (for pain).    Historical Provider, MD  ondansetron (ZOFRAN) 8 MG tablet Take 1 tablet (8 mg total) by mouth every 8 (eight) hours as needed for nausea. Patient not taking: Reported on 12/26/2014 09/23/14   Paula Libra, MD  promethazine (PHENERGAN) 12.5 MG tablet Take 1 tablet (12.5 mg total) by mouth every 6 (six) hours as needed for nausea or vomiting. 12/26/14   Vivianne Master, PA-C  traMADol (ULTRAM) 50 MG tablet Take 1 tablet (50 mg total) by mouth every 12 (twelve) hours as needed  for severe pain. 10/21/14   Ambrose Finland, NP    Assessment: 1. Dizziness 2. Viral Syndrome   Plan: Prn symptom relief with antiemetic, diarrhea agent and meclizine Offered a serum pregnancy test but patient insisted that it is not needed Considered this as a possible side effect from the methotrexate, did not discontinue at this time Stop herbal supplements   Follow up:as scheduled  The patient was given clear instructions to go to ER or return to medical center if symptoms don't improve by Monday, worsen or new  problems develop. The patient verbalized understanding. The patient was told to call to get lab results if they haven't heard anything in the next week.   This note has been created with Education officer, environmental. Any transcriptional errors are unintentional.   Scot Jun, PA-C 12/26/2014, 11:24 AM

## 2014-12-30 ENCOUNTER — Ambulatory Visit: Payer: Self-pay | Attending: Internal Medicine | Admitting: Family Medicine

## 2014-12-30 VITALS — BP 125/78 | HR 82 | Temp 99.2°F | Resp 16

## 2014-12-30 DIAGNOSIS — R112 Nausea with vomiting, unspecified: Secondary | ICD-10-CM

## 2014-12-30 LAB — COMPLETE METABOLIC PANEL WITH GFR
ALT: 154 U/L — ABNORMAL HIGH (ref 0–35)
AST: 91 U/L — ABNORMAL HIGH (ref 0–37)
Albumin: 4.2 g/dL (ref 3.5–5.2)
Alkaline Phosphatase: 51 U/L (ref 39–117)
BUN: 5 mg/dL — AB (ref 6–23)
CHLORIDE: 98 meq/L (ref 96–112)
CO2: 28 mEq/L (ref 19–32)
Calcium: 9.6 mg/dL (ref 8.4–10.5)
Creat: 0.82 mg/dL (ref 0.50–1.10)
GFR, Est African American: >89 mL/min
GFR, Est Non African American: >89 mL/min
Glucose, Bld: 84 mg/dL (ref 70–99)
POTASSIUM: 3.8 meq/L (ref 3.5–5.3)
SODIUM: 139 meq/L (ref 135–145)
TOTAL PROTEIN: 7.5 g/dL (ref 6.0–8.3)
Total Bilirubin: 0.8 mg/dL (ref 0.2–1.2)

## 2014-12-30 LAB — CBC WITH DIFFERENTIAL/PLATELET
BASOS PCT: 0 % (ref 0–1)
Basophils Absolute: 0 10*3/uL (ref 0.0–0.1)
EOS PCT: 4 % (ref 0–5)
Eosinophils Absolute: 0.3 10*3/uL (ref 0.0–0.7)
HCT: 37.6 % (ref 36.0–46.0)
HEMOGLOBIN: 12.1 g/dL (ref 12.0–15.0)
Lymphocytes Relative: 24 % (ref 12–46)
Lymphs Abs: 1.6 10*3/uL (ref 0.7–4.0)
MCH: 28.5 pg (ref 26.0–34.0)
MCHC: 32.2 g/dL (ref 30.0–36.0)
MCV: 88.7 fL (ref 78.0–100.0)
MPV: 9.4 fL (ref 8.6–12.4)
Monocytes Absolute: 0.6 10*3/uL (ref 0.1–1.0)
Monocytes Relative: 9 % (ref 3–12)
NEUTROS ABS: 4.3 10*3/uL (ref 1.7–7.7)
Neutrophils Relative %: 63 % (ref 43–77)
PLATELETS: 354 10*3/uL (ref 150–400)
RBC: 4.24 MIL/uL (ref 3.87–5.11)
RDW: 15.7 % — ABNORMAL HIGH (ref 11.5–15.5)
WBC: 6.8 10*3/uL (ref 4.0–10.5)

## 2014-12-30 NOTE — Patient Instructions (Addendum)
Stop Meclizine. May take 2 promethazine.  Drink frequent sips of gingerale or sprite. Will call with results of lab work tomorrow or Wednesday.

## 2014-12-30 NOTE — Progress Notes (Signed)
Complaining of nausea, vomiting x 5 days HA, Abdominal pain pain scale 5-6

## 2014-12-30 NOTE — Progress Notes (Signed)
Subjective:     Patient ID: Karina Murphy, female   DOB: Jun 08, 1981, 34 y.o.   MRN: [id number]  HPI   Patient presents for a follow-up of a visit 3 days ago for nausea and vomiting. She reports that promethazine is not helping. She is throwing up yellow sour liquid except when she drinks gatorade and then she throws up the gatorade. She says things are about the same as on Friday and that she has thrown up 4-5 times today. She admits to epigastric pain. She does not have a recent history of reflux. She has been on Methotrexate for about 5 weeks. She had one episode of diarrhea last Wednesday. She denes fever, chills.  An interpretor was used to obtain information   Review of Systems   See HPI     Objective:   Physical Exam   Alert, oriented, appropriate. Her skin is warm and dry. BP is consistent with reading on Friday and pulse is around 80. It was a little over 100 on Friday. Her lungs are clear to auscultation. HS are regular. Abdomen is soft with normoactive BS and epigastric tender. There is mild gaurding but no rebound. There is no evidence for dehydration.     Assessment:   Nausea/Vomitng.    Plan:     I have instructed her to stop meclizine and take 2  12.5 mg promethazine  I have instructed to stop gatorade and drink gingerale or sprite. I am doing labs today to see if there is any evidence of problems with the methotrexate.

## 2015-01-03 ENCOUNTER — Telehealth: Payer: Self-pay | Admitting: *Deleted

## 2015-01-03 NOTE — Telephone Encounter (Signed)
Patient called in to speak with RN-Belen [facility] present for interpretation.  Patient says she is still vomiting twice daily.  She was confused about her medications.  She was under the impression that she was to stop taking the methotrexate until she was called with her lab results.  Told her there was nothing in the documentation to suggest that she should stop taking the medication.  Let her know that her lab results were normal and that she should be taking the methotrexate.  Offered an appointment if she felt she needed to be seen by her PCP.  She declined and said she was feeling better and had resumed her regular activities.

## 2015-01-03 NOTE — Telephone Encounter (Signed)
Patient is returning nurse's phone call, please f/u

## 2015-01-03 NOTE — Telephone Encounter (Signed)
-----   Message from Henrietta Hoover, NP sent at 01/01/2015 10:13 AM EDT ----- Labs show no reason for symptoms. How is she doing:

## 2015-01-03 NOTE — Telephone Encounter (Signed)
[employer] interpreter left HIPPA compliant message to return call to clinic

## 2015-01-17 ENCOUNTER — Ambulatory Visit: Payer: Self-pay | Attending: Internal Medicine | Admitting: Internal Medicine

## 2015-01-17 ENCOUNTER — Encounter: Payer: Self-pay | Admitting: Internal Medicine

## 2015-01-17 VITALS — BP 127/81 | HR 93 | Temp 98.8°F | Resp 16 | Ht 63.0 in | Wt 230.0 lb

## 2015-01-17 DIAGNOSIS — M069 Rheumatoid arthritis, unspecified: Secondary | ICD-10-CM | POA: Insufficient documentation

## 2015-01-17 DIAGNOSIS — R1084 Generalized abdominal pain: Secondary | ICD-10-CM | POA: Insufficient documentation

## 2015-01-17 DIAGNOSIS — R509 Fever, unspecified: Secondary | ICD-10-CM | POA: Insufficient documentation

## 2015-01-17 DIAGNOSIS — R51 Headache: Secondary | ICD-10-CM | POA: Insufficient documentation

## 2015-01-17 DIAGNOSIS — Z791 Long term (current) use of non-steroidal anti-inflammatories (NSAID): Secondary | ICD-10-CM | POA: Insufficient documentation

## 2015-01-17 LAB — POCT URINALYSIS DIPSTICK
Bilirubin, UA: NEGATIVE
Glucose, UA: NEGATIVE
Ketones, UA: NEGATIVE
Leukocytes, UA: NEGATIVE
Nitrite, UA: NEGATIVE
PROTEIN UA: NEGATIVE
RBC UA: NEGATIVE
Spec Grav, UA: 1.015
UROBILINOGEN UA: 0.2
pH, UA: 6.5

## 2015-01-17 NOTE — Progress Notes (Signed)
Patient ID: Karina Murphy, female   DOB: [birth year], 34 y.o.   MRN: [id number]  CC: abdominal pain   HPI: Karina Murphy is a 34 y.o. female here today for a follow up visit.  Patient has past medical history of rheumatoid arthritis.  Patient reports that she was switched to Methotraxate several weeks ago. Today she reports abdominal pain, headaches, and fevers that started 2 days ago. This has been constant. She has only tried diclofenac for the headaches. She takes diclofenac every night to help with RA pain. Been having a headache for over one week when she wakes up in the morning. Has not been sleeping well. Wakes up in the middle of the night to void 3-4 times per night. She is wondering if she is having a side effect from drinking alcohol while on Methotrexate. She denies any chance of pregnancy and the pain is not related to her menstrual cycle.   Patient has No headache, No chest pain, No new weakness tingling or numbness, No Cough - SOB.  No Known Allergies Past Medical History  Diagnosis Date  . Hypertension   . Arthritis    Current Outpatient Prescriptions on File Prior to Visit  Medication Sig Dispense Refill  . diclofenac (VOLTAREN) 75 MG EC tablet Take 1 tablet (75 mg total) by mouth 2 (two) times daily. (Patient not taking: Reported on 12/30/2014) 60 tablet 2  . diphenoxylate-atropine (LOMOTIL) 2.5-0.025 MG per tablet Take 1 tablet by mouth 4 (four) times daily as needed for diarrhea or loose stools. (Patient not taking: Reported on 12/30/2014) 30 tablet 0  . escitalopram (LEXAPRO) 10 MG tablet Take 1 tablet (10 mg total) by mouth daily. (Patient not taking: Reported on 01/17/2015) 30 tablet 4  . folic acid (FOLVITE) 1 MG tablet Take 1 mg by mouth daily.    Marland Kitchen LORazepam (ATIVAN) 0.5 MG tablet Take 1 tablet (0.5 mg total) by mouth daily as needed for anxiety. (Patient not taking: Reported on 01/17/2015) 30 tablet 0  . meclizine (ANTIVERT) 25 MG tablet Take 1 tablet (25 mg  total) by mouth 3 (three) times daily as needed for dizziness or nausea. (Patient not taking: Reported on 01/17/2015) 30 tablet 0  . naproxen sodium (ANAPROX) 220 MG tablet Take 220 mg by mouth 2 (two) times daily as needed (for pain).    . ondansetron (ZOFRAN) 8 MG tablet Take 1 tablet (8 mg total) by mouth every 8 (eight) hours as needed for nausea. (Patient not taking: Reported on 01/17/2015) 10 tablet 0  . predniSONE (STERAPRED UNI-PAK) 10 MG tablet Take by mouth daily. 6-5-4-3-2-1. Take 6 pills today and decrease by one tablet per day (Patient not taking: Reported on 01/17/2015) 21 tablet 0  . promethazine (PHENERGAN) 12.5 MG tablet Take 1 tablet (12.5 mg total) by mouth every 6 (six) hours as needed for nausea or vomiting. (Patient not taking: Reported on 12/30/2014) 30 tablet 0  . traMADol (ULTRAM) 50 MG tablet Take 1 tablet (50 mg total) by mouth every 12 (twelve) hours as needed for severe pain. (Patient not taking: Reported on 12/30/2014) 60 tablet 0  . [DISCONTINUED] sulfaSALAzine (AZULFIDINE) 500 MG tablet Take 1,000 mg by mouth daily.     No current facility-administered medications on file prior to visit.   Family History  Problem Relation Age of Onset  . Diabetes Mother   . Hypertension Mother    History   Social History  . Marital Status: Single    Spouse Name: N/A  . Number  of Children: N/A  . Years of Education: N/A   Occupational History  . Not on file.   Social History Main Topics  . Smoking status: Never Smoker   . Smokeless tobacco: Never Used  . Alcohol Use: No  . Drug Use: No  . Sexual Activity: Yes    Birth Control/ Protection: Condom   Other Topics Concern  . Not on file   Social History Narrative    Review of Systems: See HPI.    Objective:   Filed Vitals:   01/17/15 1501  BP: 127/81  Pulse: 93  Temp: 98.8 F (37.1 C)  Resp: 16    Physical Exam: Constitutional: Patient appears well-developed and well-nourished. No distress. HENT:  Normocephalic, atraumatic, External right and left ear normal. Oropharynx is clear and moist.  Eyes: Conjunctivae and EOM are normal. PERRLA, no scleral icterus. Neck: Normal ROM. Neck supple. No JVD. No tracheal deviation. No thyromegaly. CVS: RRR, S1/S2 +, no murmurs, no gallops, no carotid bruit.  Pulmonary: Effort and breath sounds normal, no stridor, rhonchi, wheezes, rales.  Abdominal: Soft. BS +,  no distension, tenderness, rebound or guarding.  Musculoskeletal: Normal range of motion. No edema and no tenderness.  Lymphadenopathy: No lymphadenopathy noted, cervical, inguinal or axillary Neuro: Alert. Normal reflexes, muscle tone coordination. No cranial nerve deficit. Skin: Skin is warm and dry. No rash noted. Not diaphoretic. No erythema. No pallor. Psychiatric: Normal mood and affect. Behavior, judgment, thought content normal.  Lab Results  Component Value Date   WBC 6.8 12/30/2014   HGB 12.1 12/30/2014   HCT 37.6 12/30/2014   MCV 88.7 12/30/2014   PLT 354 12/30/2014   Lab Results  Component Value Date   CREATININE 0.82 12/30/2014   BUN 5* 12/30/2014   NA 139 12/30/2014   K 3.8 12/30/2014   CL 98 12/30/2014   CO2 28 12/30/2014    Lab Results  Component Value Date   HGBA1C 5.7* 12/26/2013   Lipid Panel  No results found for: CHOL, TRIG, HDL, CHOLHDL, VLDL, LDLCALC     Assessment and plan:   Forrestine was seen today for follow-up.  Diagnoses and all orders for this visit:  Generalized abdominal pain Orders: -     Urinalysis Dipstick I have explained that taking too much diclofenac can cause GI complications. I have advised patient to abstain from alcohol while on Methotrexate. She has been to GI and was cleared. Ultrasounds and CT's in the past have been negative. This is a chronic problem for the patient and I believe it is related to patients anxiety and depression.   Fever in adult Patient has presented with low grade temps several times. I believe they are  a result of her RA  Due to language barrier, an interpreter was present during the history-taking and subsequent discussion (and for part of the physical exam) with this patient.  May follow up as needed.       Holland Commons, NP-C [facility] [phone] 01/17/2015, 3:51 PM

## 2015-01-17 NOTE — Progress Notes (Signed)
Stating Wednesday pt has had a headache w/ fever and she is having pain in her abdomen. interpreter [phone]

## 2015-02-14 ENCOUNTER — Other Ambulatory Visit: Payer: Self-pay | Admitting: Internal Medicine

## 2015-02-14 DIAGNOSIS — F411 Generalized anxiety disorder: Secondary | ICD-10-CM

## 2015-02-14 MED ORDER — LORAZEPAM 0.5 MG PO TABS: 0.5000 mg | ORAL_TABLET | Freq: Every day | ORAL | Status: DC | PRN

## 2015-03-03 ENCOUNTER — Ambulatory Visit: Payer: Self-pay | Attending: Internal Medicine

## 2015-04-03 ENCOUNTER — Ambulatory Visit: Payer: Self-pay | Attending: Internal Medicine | Admitting: Internal Medicine

## 2015-04-03 ENCOUNTER — Encounter: Payer: Self-pay | Admitting: Internal Medicine

## 2015-04-03 VITALS — BP 126/82 | HR 99 | Temp 98.4°F | Resp 18 | Ht 64.0 in | Wt 224.2 lb

## 2015-04-03 DIAGNOSIS — J040 Acute laryngitis: Secondary | ICD-10-CM | POA: Insufficient documentation

## 2015-04-03 DIAGNOSIS — M069 Rheumatoid arthritis, unspecified: Secondary | ICD-10-CM | POA: Insufficient documentation

## 2015-04-03 DIAGNOSIS — F411 Generalized anxiety disorder: Secondary | ICD-10-CM | POA: Insufficient documentation

## 2015-04-03 DIAGNOSIS — R109 Unspecified abdominal pain: Secondary | ICD-10-CM | POA: Insufficient documentation

## 2015-04-03 MED ORDER — DICLOFENAC SODIUM 75 MG PO TBEC: 75.0000 mg | DELAYED_RELEASE_TABLET | Freq: Two times a day (BID) | ORAL | Status: DC

## 2015-04-03 MED ORDER — LORAZEPAM 0.5 MG PO TABS: 0.5000 mg | ORAL_TABLET | Freq: Every day | ORAL | Status: DC | PRN

## 2015-04-03 NOTE — Progress Notes (Signed)
Patient here for throat pain, lower left abdominal pain, and dizziness. Patient reports pain in lower left abdomen rated at a 7 described as cramping. Pain is constant and started a month ago. Patient reports pain in throat rated at a 10 described as burning. Pain is constant and has been going on for 3 weeks now. Patient reports she has been vomiting also for about a week now. Patient reports vomiting 1-2x daily. Patient states zofran helps sometimes with vomiting. Patient reports dizziness. Patient has taken her diclofenac for today and needs a refill on it. Patient not sure if PCP wanted to refill lorazepam.

## 2015-04-03 NOTE — Progress Notes (Signed)
Patient ID: Karina Murphy, female   DOB: 1981-04-06, 34 y.o.   MRN: [id number]  CC: abdominal pain, dizziness  HPI: Karina Murphy is a 34 y.o. female here today for a follow up visit.  Patient has past medical history of Rheumatoid Arthritis. She is concerned about not having a menstrual cycle since April. She has cramps monthly like she is having a cycle but no bleeding. She reports as a child she would have a cycle every other month but she has never been this long without one. She denies any chance of pregnancy or a history of PCOS.   She reports that she has been having symptoms of ear pain and sore throat for 3 weeks. Yesterday she notes becoming very hoarse. She has tried hot tea with honey and salt water gargles. She has tried Dietitian. She denies fever, chills, cough, sneezing.    No Known Allergies Past Medical History  Diagnosis Date  . Hypertension   . Arthritis    Current Outpatient Prescriptions on File Prior to Visit  Medication Sig Dispense Refill  . diclofenac (VOLTAREN) 75 MG EC tablet Take 1 tablet (75 mg total) by mouth 2 (two) times daily. (Patient not taking: Reported on 12/30/2014) 60 tablet 2  . diphenoxylate-atropine (LOMOTIL) 2.5-0.025 MG per tablet Take 1 tablet by mouth 4 (four) times daily as needed for diarrhea or loose stools. (Patient not taking: Reported on 12/30/2014) 30 tablet 0  . escitalopram (LEXAPRO) 10 MG tablet Take 1 tablet (10 mg total) by mouth daily. (Patient not taking: Reported on 01/17/2015) 30 tablet 4  . folic acid (FOLVITE) 1 MG tablet Take 1 mg by mouth daily.    Marland Kitchen LORazepam (ATIVAN) 0.5 MG tablet Take 1 tablet (0.5 mg total) by mouth daily as needed for anxiety. 30 tablet 0  . meclizine (ANTIVERT) 25 MG tablet Take 1 tablet (25 mg total) by mouth 3 (three) times daily as needed for dizziness or nausea. (Patient not taking: Reported on 01/17/2015) 30 tablet 0  . methotrexate (RHEUMATREX) 2.5 MG tablet Take 15 mg by mouth once a week.  Caution:Chemotherapy. Protect from light.    . naproxen sodium (ANAPROX) 220 MG tablet Take 220 mg by mouth 2 (two) times daily as needed (for pain).    . ondansetron (ZOFRAN) 8 MG tablet Take 1 tablet (8 mg total) by mouth every 8 (eight) hours as needed for nausea. (Patient not taking: Reported on 01/17/2015) 10 tablet 0  . predniSONE (STERAPRED UNI-PAK) 10 MG tablet Take by mouth daily. 6-5-4-3-2-1. Take 6 pills today and decrease by one tablet per day (Patient not taking: Reported on 01/17/2015) 21 tablet 0  . promethazine (PHENERGAN) 12.5 MG tablet Take 1 tablet (12.5 mg total) by mouth every 6 (six) hours as needed for nausea or vomiting. (Patient not taking: Reported on 12/30/2014) 30 tablet 0  . traMADol (ULTRAM) 50 MG tablet Take 1 tablet (50 mg total) by mouth every 12 (twelve) hours as needed for severe pain. (Patient not taking: Reported on 12/30/2014) 60 tablet 0  . [DISCONTINUED] sulfaSALAzine (AZULFIDINE) 500 MG tablet Take 1,000 mg by mouth daily.     No current facility-administered medications on file prior to visit.   Family History  Problem Relation Age of Onset  . Diabetes Mother   . Hypertension Mother    History   Social History  . Marital Status: Single    Spouse Name: N/A  . Number of Children: N/A  . Years of Education: N/A   Occupational  History  . Not on file.   Social History Main Topics  . Smoking status: Never Smoker   . Smokeless tobacco: Never Used  . Alcohol Use: No  . Drug Use: No  . Sexual Activity: Yes    Birth Control/ Protection: Condom   Other Topics Concern  . Not on file   Social History Narrative    Review of Systems  Constitutional: Negative for fever, chills and weight loss.  HENT: Positive for ear pain and sore throat.   Gastrointestinal: Positive for nausea and abdominal pain. Negative for heartburn, diarrhea and constipation.  Genitourinary: Negative.   Musculoskeletal: Positive for joint pain.  Psychiatric/Behavioral: The  patient is nervous/anxious.   All other systems reviewed and are negative.  Objective:   Filed Vitals:   04/03/15 1723  BP: 126/82  Pulse: 99  Temp: 98.4 F (36.9 C)  Resp: 18    Physical Exam  HENT:  Right Ear: External ear normal.  Left Ear: External ear normal.  Nose: Nose normal.  Mouth/Throat: Oropharynx is clear and moist. No oropharyngeal exudate.  Eyes: EOM are normal. Pupils are equal, round, and reactive to light. Right eye exhibits no discharge. Left eye exhibits no discharge.  Neck: No thyromegaly present.  Cardiovascular: Normal rate, regular rhythm and normal heart sounds.   Pulmonary/Chest: Effort normal and breath sounds normal.  Abdominal: Soft. Bowel sounds are normal. She exhibits no distension and no mass. There is no tenderness.  Lymphadenopathy:    She has no cervical adenopathy.     Lab Results  Component Value Date   WBC 6.8 12/30/2014   HGB 12.1 12/30/2014   HCT 37.6 12/30/2014   MCV 88.7 12/30/2014   PLT 354 12/30/2014   Lab Results  Component Value Date   CREATININE 0.82 12/30/2014   BUN 5* 12/30/2014   NA 139 12/30/2014   K 3.8 12/30/2014   CL 98 12/30/2014   CO2 28 12/30/2014    Lab Results  Component Value Date   HGBA1C 5.7* 12/26/2013   Lipid Panel  No results found for: CHOL, TRIG, HDL, CHOLHDL, VLDL, LDLCALC     Assessment and plan:   Nhyla was seen today for abdominal pain, sore throat and dizziness.  Diagnoses and all orders for this visit:  Laryngitis Orders: -     Throat culture -     POCT rapid strep A Will culture and call patient with results. May continue salt water gargles and hot tea with honey. Rest voice as much as possible   Anxiety state Orders: -     Refill LORazepam (ATIVAN) 0.5 MG tablet; Take 1 tablet (0.5 mg total) by mouth daily as needed for anxiety. Encouraged patient to start back taking and seek counseling with [facility]   Rheumatoid arthritis flare Orders: -    Refill  diclofenac (VOLTAREN) 75 MG EC tablet; Take 1 tablet (75 mg total) by mouth 2 (two) times daily. Continue care with Rheumatologist    May follow up as needed   Ambrose Finland, NP-C [facility] [phone] 04/03/2015, 5:34 PM

## 2015-04-03 NOTE — Patient Instructions (Signed)
Laringitis  (Laryngitis)  En la parte superior de la trquea est la laringe. La laringe es donde se produce la voz. Dentro de la laringe hay 2 bandas de msculos llamados cuerdas vocales. Al respirar, las cuerdas vocales se relajan y se abren, entonces el aire entra a los pulmones. Cuando queremos [employer], las cuerdas vocales se juntan y vibran. El sonido de esas vibraciones va a la garganta y Manufacturing systems engineer por la boca como la voz.  La laringitis es la inflamacin de las cuerdas [place], y causa ronquera, tos, prdida de la voz,dolor y sequedad de Administrator. La laringitis puede ser transitoria (aguda) o permanente (crnica). En la [employer], la laringitis aguda mejora con [place]. La laringitis crnica puede durar ms de 3 semanas. CAUSAS  Con frecuencia se relaciona con el fumar, hablar o gritar en exceso, as como por inhalacin de humos txicos o alergias. Las causas ms frecuentes de laringitis aguda son las infecciones virales, forzar la voz, las paperas o rubola y las infecciones bacterianas. Las larigitis crnicas se producen por forzar o Engineer, drilling las cuerdas vocales, goteo pos nasal, por plipos en las cuerdas vocales o por reflujo cido.  SNTOMAS   Tos.  Dolor de Conservator, museum/gallery. FACTORES DE RIESGO   Infecciones respiratorias.  Exposicin a sustancias irritantes, como el humo del cigarrillo, cantidades excesivas de alcohol, los cidos estomacales y el trabajar con sustancias qumicas.  Traumatismos de Actuary, como lesiones en las cuerdas vocales por gritar o Engineer, manufacturing systems. DIAGNSTICO  El mdico le har un examen fsico. Durante el examen fsico, su mdico le examinar la garganta. El signo de laringitis ms frecuente es la ronquera. Puede ser necesario realizar una laringoscopa para confirmar el diagnstico de esta enfermedad. Este procedimiento le permite al cirujano observar el interior de la laringe.  INSTRUCCIONES PARA EL CUIDADO EN EL HOGAR    Debe ingerir gran cantidad de lquido para mantener la orina de tono claro o color amarillo plido.  Descanse hasta que no tenga ms sntomas, o segn las indicaciones del mdico.  Trate de respirar aire hmedo.  Tome los [employer] le indic su mdico.  No  fume.  Hable lo menos posible (esto incluye los susurros).  En vez de hablar, escriba en un papel, hasta que su voz vuelva a la normalidad.  Si el problema no ha mejorado en [address] consulte a su mdico. SOLICITE ATENCIN MDICA SI:   Tiene dificultad para respirar.  Tose y escupe sangre.  Tiene fiebre persistente.  Aumenta el dolor.  Tiene dificultad para tragar. ASEGRESE DE QUE:   Comprende estas instrucciones.  Controlar su enfermedad.  Solicitar ayuda de inmediato si no mejora o si empeora. Document Released: 06/09/2005 Document Revised: 11/22/2011 [employer] Patient Information 2015 [place], [place]. This information is not intended to replace advice given to you by your health care provider. Make sure you discuss any questions you have with your health care provider.

## 2015-04-12 DIAGNOSIS — R1013 Epigastric pain: Secondary | ICD-10-CM | POA: Insufficient documentation

## 2015-04-12 DIAGNOSIS — Z791 Long term (current) use of non-steroidal anti-inflammatories (NSAID): Secondary | ICD-10-CM | POA: Insufficient documentation

## 2015-04-12 DIAGNOSIS — I10 Essential (primary) hypertension: Secondary | ICD-10-CM | POA: Insufficient documentation

## 2015-04-12 DIAGNOSIS — M1990 Unspecified osteoarthritis, unspecified site: Secondary | ICD-10-CM | POA: Insufficient documentation

## 2015-04-12 DIAGNOSIS — Z79899 Other long term (current) drug therapy: Secondary | ICD-10-CM | POA: Insufficient documentation

## 2015-04-12 DIAGNOSIS — R197 Diarrhea, unspecified: Secondary | ICD-10-CM | POA: Insufficient documentation

## 2015-04-12 DIAGNOSIS — R112 Nausea with vomiting, unspecified: Secondary | ICD-10-CM | POA: Insufficient documentation

## 2015-04-13 ENCOUNTER — Emergency Department (HOSPITAL_COMMUNITY)
Admission: EM | Admit: 2015-04-13 | Discharge: 2015-04-13 | Disposition: A | Discharge status: Home / Self Care | Payer: Self-pay | Attending: Emergency Medicine | Admitting: Emergency Medicine

## 2015-04-13 ENCOUNTER — Encounter (HOSPITAL_COMMUNITY): Payer: Self-pay

## 2015-04-13 ENCOUNTER — Emergency Department (HOSPITAL_COMMUNITY): Payer: Self-pay

## 2015-04-13 DIAGNOSIS — R109 Unspecified abdominal pain: Secondary | ICD-10-CM

## 2015-04-13 DIAGNOSIS — R112 Nausea with vomiting, unspecified: Secondary | ICD-10-CM

## 2015-04-13 LAB — COMPREHENSIVE METABOLIC PANEL
ALBUMIN: 4 g/dL (ref 3.5–5.0)
ALT: 37 U/L (ref 14–54)
ANION GAP: 14 (ref 5–15)
AST: 30 U/L (ref 15–41)
Alkaline Phosphatase: 55 U/L (ref 38–126)
BUN: 7 mg/dL (ref 6–20)
CO2: 22 mmol/L (ref 22–32)
CREATININE: 0.79 mg/dL (ref 0.44–1.00)
Calcium: 9.7 mg/dL (ref 8.9–10.3)
Chloride: 100 mmol/L — ABNORMAL LOW (ref 101–111)
GFR calc Af Amer: >60 mL/min (ref >60)
GFR calc non Af Amer: >60 mL/min (ref >60)
Glucose, Bld: 102 mg/dL — ABNORMAL HIGH (ref 65–99)
Potassium: 3.8 mmol/L (ref 3.5–5.1)
Sodium: 136 mmol/L (ref 135–145)
TOTAL PROTEIN: 7.8 g/dL (ref 6.5–8.1)
Total Bilirubin: 1 mg/dL (ref 0.3–1.2)

## 2015-04-13 LAB — URINALYSIS, ROUTINE W REFLEX MICROSCOPIC
Glucose, UA: NEGATIVE mg/dL
Hgb urine dipstick: NEGATIVE
KETONES UR: 15 mg/dL — AB
Leukocytes, UA: NEGATIVE
Nitrite: NEGATIVE
Protein, ur: NEGATIVE mg/dL
SPECIFIC GRAVITY, URINE: 1.025 (ref 1.005–1.030)
UROBILINOGEN UA: 0.2 mg/dL (ref 0.0–1.0)
pH: 5.5 (ref 5.0–8.0)

## 2015-04-13 LAB — CBC
HCT: 40.3 % (ref 36.0–46.0)
HEMOGLOBIN: 13.1 g/dL (ref 12.0–15.0)
MCH: 28.9 pg (ref 26.0–34.0)
MCHC: 32.5 g/dL (ref 30.0–36.0)
MCV: 88.8 fL (ref 78.0–100.0)
Platelets: 345 10*3/uL (ref 150–400)
RBC: 4.54 MIL/uL (ref 3.87–5.11)
RDW: 14.3 % (ref 11.5–15.5)
WBC: 8.6 10*3/uL (ref 4.0–10.5)

## 2015-04-13 LAB — LIPASE, BLOOD: Lipase: 16 U/L — ABNORMAL LOW (ref 22–51)

## 2015-04-13 LAB — I-STAT BETA HCG BLOOD, ED (MC, WL, AP ONLY): I-stat hCG, quantitative: <5 m[IU]/mL (ref <5)

## 2015-04-13 MED ORDER — PROMETHAZINE HCL 25 MG PO TABS: 25.0000 mg | ORAL_TABLET | Freq: Four times a day (QID) | ORAL | Status: DC | PRN

## 2015-04-13 MED ORDER — ONDANSETRON 4 MG PO TBDP
8.0000 mg | ORAL_TABLET | Freq: Once | ORAL | Status: AC
  Administered 2015-04-13: 8 mg via ORAL
  Filled 2015-04-13: qty 2

## 2015-04-13 MED ORDER — OMEPRAZOLE 20 MG PO CPDR: 20.0000 mg | DELAYED_RELEASE_CAPSULE | Freq: Every day | ORAL | Status: DC

## 2015-04-13 MED ORDER — HYDROMORPHONE HCL 1 MG/ML IJ SOLN
1.0000 mg | Freq: Once | INTRAMUSCULAR | Status: AC
  Administered 2015-04-13: 1 mg via INTRAVENOUS
  Filled 2015-04-13: qty 1

## 2015-04-13 MED ORDER — IOHEXOL 300 MG/ML  SOLN
25.0000 mL | Freq: Once | INTRAMUSCULAR | Status: AC | PRN
  Administered 2015-04-13: 25 mL via ORAL

## 2015-04-13 MED ORDER — HYDROMORPHONE HCL 1 MG/ML IJ SOLN
0.5000 mg | Freq: Once | INTRAMUSCULAR | Status: AC
  Administered 2015-04-13: 0.5 mg via INTRAVENOUS
  Filled 2015-04-13: qty 1

## 2015-04-13 MED ORDER — ONDANSETRON HCL 4 MG/2ML IJ SOLN
4.0000 mg | Freq: Once | INTRAMUSCULAR | Status: AC
  Administered 2015-04-13: 4 mg via INTRAVENOUS
  Filled 2015-04-13: qty 2

## 2015-04-13 MED ORDER — IOHEXOL 300 MG/ML  SOLN
100.0000 mL | Freq: Once | INTRAMUSCULAR | Status: AC | PRN
  Administered 2015-04-13: 100 mL via INTRAVENOUS

## 2015-04-13 NOTE — ED Notes (Signed)
Pt here for abd pain for 2 days and states she has been vomiting for 2 weeks and has had a little bit of diarrhea. Also reports some chills but no fever.

## 2015-04-13 NOTE — ED Provider Notes (Signed)
CSN: [id number]     Arrival date & time 04/12/15  2354 History   This chart was scribed for Azalia Bilis, MD by Evon Slack, ED Scribe. This patient was seen in room A11C/A11C and the patient's care was started at 12:35 AM.    Chief Complaint  Patient presents with  . Abdominal Pain  . Emesis   The history is provided by the patient. No language interpreter was used.   HPI Comments: Karina Murphy is a 34 y.o. female who presents to the Emergency Department complaining of worsening intermittent abdominal pain onset 2 days prior. She states she has associated nausea, vomiting and slight diarrhea. She reports 2 weeks of nausea and vomiting. She states that the pain is worse after eating. She doesn't report any medications PTA. Denies fever or dysuria or other related symptoms.   Past Medical History  Diagnosis Date  . Hypertension   . Arthritis    History reviewed. No pertinent past surgical history. Family History  Problem Relation Age of Onset  . Diabetes Mother   . Hypertension Mother    History  Substance Use Topics  . Smoking status: Never Smoker   . Smokeless tobacco: Never Used  . Alcohol Use: No   OB History    No data available      Review of Systems  Constitutional: Negative for fever.  Gastrointestinal: Positive for nausea, vomiting, abdominal pain and diarrhea.  Genitourinary: Negative for dysuria.  All other systems reviewed and are negative.   Allergies  Review of patient's allergies indicates no known allergies.  Home Medications   Prior to Admission medications   Medication Sig Start Date End Date Taking? Authorizing Provider  diclofenac (VOLTAREN) 75 MG EC tablet Take 1 tablet (75 mg total) by mouth 2 (two) times daily. 04/03/15   Ambrose Finland, NP  diphenoxylate-atropine (LOMOTIL) 2.5-0.025 MG per tablet Take 1 tablet by mouth 4 (four) times daily as needed for diarrhea or loose stools. Patient not taking: Reported on 12/30/2014 12/26/14    Vivianne Master, PA-C  escitalopram (LEXAPRO) 10 MG tablet Take 1 tablet (10 mg total) by mouth daily. Patient not taking: Reported on 01/17/2015 11/13/14   Ambrose Finland, NP  folic acid (FOLVITE) 1 MG tablet Take 1 mg by mouth daily.    Historical Provider, MD  LORazepam (ATIVAN) 0.5 MG tablet Take 1 tablet (0.5 mg total) by mouth daily as needed for anxiety. 04/03/15   Ambrose Finland, NP  meclizine (ANTIVERT) 25 MG tablet Take 1 tablet (25 mg total) by mouth 3 (three) times daily as needed for dizziness or nausea. Patient not taking: Reported on 01/17/2015 12/26/14   Vivianne Master, PA-C  methotrexate (RHEUMATREX) 2.5 MG tablet Take 15 mg by mouth once a week. Caution:Chemotherapy. Protect from light.    Historical Provider, MD  naproxen sodium (ANAPROX) 220 MG tablet Take 220 mg by mouth 2 (two) times daily as needed (for pain).    Historical Provider, MD  ondansetron (ZOFRAN) 8 MG tablet Take 1 tablet (8 mg total) by mouth every 8 (eight) hours as needed for nausea. Patient not taking: Reported on 01/17/2015 09/23/14   Paula Libra, MD  predniSONE (STERAPRED UNI-PAK) 10 MG tablet Take by mouth daily. 6-5-4-3-2-1. Take 6 pills today and decrease by one tablet per day Patient not taking: Reported on 01/17/2015 10/10/14   Ambrose Finland, NP  promethazine (PHENERGAN) 12.5 MG tablet Take 1 tablet (12.5 mg total) by mouth every 6 (six) hours  as needed for nausea or vomiting. Patient not taking: Reported on 12/30/2014 12/26/14   Vivianne Master, PA-C  traMADol (ULTRAM) 50 MG tablet Take 1 tablet (50 mg total) by mouth every 12 (twelve) hours as needed for severe pain. Patient not taking: Reported on 12/30/2014 10/21/14   Ambrose Finland, NP   BP 133/66 mmHg  Pulse 90  Temp(Src) 98.8 F (37.1 C) (Oral)  Resp 16  Ht 5\' 4"  (1.626 m)  Wt 217 lb 9 oz (98.686 kg)  BMI 37.33 kg/m2  SpO2 95%  LMP 04/01/2015   Physical Exam  Constitutional: She is oriented to person, place, and time. She appears well-developed and  well-nourished. No distress.  HENT:  Head: Normocephalic and atraumatic.  Eyes: EOM are normal.  Neck: Normal range of motion.  Cardiovascular: Normal rate, regular rhythm and normal heart sounds.   Pulmonary/Chest: Effort normal and breath sounds normal.  Abdominal: Soft. She exhibits no distension. There is tenderness in the epigastric area.  Musculoskeletal: Normal range of motion.  Neurological: She is alert and oriented to person, place, and time.  Skin: Skin is warm and dry.  Psychiatric: She has a normal mood and affect. Judgment normal.  Nursing note and vitals reviewed.   ED Course  Procedures (including critical care time) DIAGNOSTIC STUDIES: Oxygen Saturation is 98% on RA, normal by my interpretation.    COORDINATION OF CARE: 12:55 AM-Discussed treatment plan with pt at bedside and pt agreed to plan.     Labs Review Labs Reviewed  LIPASE, BLOOD - Abnormal; Notable for the following:    Lipase 16 (*)    All other components within normal limits  COMPREHENSIVE METABOLIC PANEL - Abnormal; Notable for the following:    Chloride 100 (*)    Glucose, Bld 102 (*)    All other components within normal limits  URINALYSIS, ROUTINE W REFLEX MICROSCOPIC (NOT AT [facility]) - Abnormal; Notable for the following:    Bilirubin Urine SMALL (*)    Ketones, ur 15 (*)    All other components within normal limits  CBC  I-STAT BETA HCG BLOOD, ED ([facility], [facility], [facility] ONLY)    Imaging Review [facility] Abdomen Complete  04/13/2015   CLINICAL DATA:  Upper abdominal pain and vomiting. Pain after eating.  EXAM: ULTRASOUND ABDOMEN COMPLETE  COMPARISON:  CT 09/23/2014  FINDINGS: Gallbladder: No gallstones or wall thickening visualized. Wall thickness of 1.9 mm. No sonographic Murphy sign noted.  Common bile duct: Diameter: 2.3 mm  Liver: No focal lesion identified. Diffusely increased and heterogeneous in parenchymal echogenicity, mild. Normal directional flow in the main portal vein.  IVC: No abnormality  visualized.  Pancreas: Not well visualized due to overlying bowel gas and body habitus.  Spleen: Size and appearance within normal limits.  Right Kidney: Length: 11.6. Echogenicity within normal limits. No mass or hydronephrosis visualized.  Left Kidney: Length: 11.8. Echogenicity within normal limits. No mass or hydronephrosis visualized.  Abdominal aorta: No aneurysm visualized.  Other findings: None.  IMPRESSION: 1. No acute abnormality. 2. Hepatic steatosis.   Electronically Signed   By: 11/22/2014 M.D.   On: 04/13/2015 02:00   Ct Abdomen Pelvis W Contrast  04/13/2015   CLINICAL DATA:  Vomiting for 2 weeks.  Abdominal pain for 2 days.  EXAM: CT ABDOMEN AND PELVIS WITH CONTRAST  TECHNIQUE: Multidetector CT imaging of the abdomen and pelvis was performed using the standard protocol following bolus administration of intravenous contrast.  CONTRAST:  04/15/2015 OMNIPAQUE IOHEXOL 300 MG/ML  SOLN  COMPARISON:  None.  FINDINGS: Lower chest: Lung bases are clear.  Hepatobiliary: No focal hepatic lesion. No biliary duct dilatation. Gallbladder is normal. Common bile duct is normal.  Pancreas: Pancreas is normal. No ductal dilatation. No pancreatic inflammation.  Spleen: Normal spleen  Adrenals/urinary tract: Adrenal glands and kidneys are normal. The ureters and bladder normal.  Stomach/Bowel: Stomach, small bowel, appendix, and cecum are normal. The colon and rectosigmoid colon are normal.  Vascular/Lymphatic: Abdominal aorta is normal caliber. There is no retroperitoneal or periportal lymphadenopathy. No pelvic lymphadenopathy.  Reproductive: Uterus ovaries are normal.  Musculoskeletal: No aggressive osseous lesion. Sclerosis along the SI joints noted.  Other: No free fluid.  IMPRESSION: 1. No acute findings in the abdomen pelvis. 2. Normal appendix. 3. Normal gallbladder. 4. Sclerosis of the SI joints.   Electronically Signed   By: Genevive Bi M.D.   On: 04/13/2015 07:13  I personally reviewed the imaging  tests through PACS system I reviewed available ER/hospitalization records through the EMR    EKG Interpretation None      MDM   Final diagnoses:  None    Ultrasound and CT scan without acute pathology.  Given the patient's ongoing symptoms she will be referred to gastroenterology as an outpatient.  I'll place her on Prilosec.  She understands return the ER for new or worsening symptoms.  This could represent gastroparesis versus gastritis versus duodenal ulcer.  Interpreter services utilized for discharge instructions and history  I personally performed the services described in this documentation, which was scribed in my presence. The recorded information has been reviewed and is accurate.        Azalia Bilis, MD 04/13/15 [phone]

## 2015-04-13 NOTE — Discharge Instructions (Signed)
Dolor abdominal °(Abdominal Pain) °El dolor puede tener muchas causas. Normalmente la causa del dolor abdominal no es una enfermedad y mejorará sin tratamiento. Frecuentemente puede controlarse y tratarse en casa. Su médico le realizará un examen físico y posiblemente solicite análisis de sangre y radiografías para ayudar a determinar la gravedad de su dolor. Sin embargo, en muchos casos, debe transcurrir más tiempo antes de que se pueda encontrar una causa evidente del dolor. Antes de llegar a ese punto, es posible que su médico no sepa si necesita más pruebas o un tratamiento más profundo. °INSTRUCCIONES PARA EL CUIDADO EN EL HOGAR  °Esté atento al dolor para ver si hay cambios. Las siguientes indicaciones ayudarán a aliviar cualquier molestia que pueda sentir: °· Tome solo medicamentos de venta libre o recetados, según las indicaciones del médico. °· No tome laxantes a menos que se lo haya indicado su médico. °· Pruebe con una dieta líquida absoluta (caldo, té o agua) según se lo indique su médico. Introduzca gradualmente una dieta normal, según su tolerancia. °SOLICITE ATENCIÓN MÉDICA SI: °· Tiene dolor abdominal sin explicación. °· Tiene dolor abdominal relacionado con náuseas o diarrea. °· Tiene dolor cuando orina o defeca. °· Experimenta dolor abdominal que lo despierta de noche. °· Tiene dolor abdominal que empeora o mejora cuando come alimentos. °· Tiene dolor abdominal que empeora cuando come alimentos grasosos. °· Tiene fiebre. °SOLICITE ATENCIÓN MÉDICA DE INMEDIATO SI:  °· El dolor no desaparece en un plazo máximo de 2 horas. °· No deja de (vomitar). °· El dolor se siente solo en partes del abdomen, como el lado derecho o la parte inferior izquierda del abdomen. °· Evacúa materia fecal sanguinolenta o negra, de aspecto alquitranado. °ASEGÚRESE DE QUE: °· Comprende estas instrucciones. °· Controlará su afección. °· Recibirá ayuda de inmediato si no mejora o si empeora. °Document Released: 08/30/2005  Document Revised: 09/04/2013 °ExitCare® Patient Information ©2015 [employer]. This information is not intended to replace advice given to you by your health care provider. Make sure you discuss any questions you have with your health care provider. ° °

## 2015-06-12 ENCOUNTER — Encounter: Payer: Self-pay | Admitting: Internal Medicine

## 2015-06-12 ENCOUNTER — Ambulatory Visit: Payer: Self-pay | Attending: Internal Medicine | Admitting: Internal Medicine

## 2015-06-12 VITALS — BP 123/86 | HR 82 | Temp 98.0°F | Resp 16 | Ht 64.0 in | Wt 202.0 lb

## 2015-06-12 DIAGNOSIS — R51 Headache: Secondary | ICD-10-CM

## 2015-06-12 DIAGNOSIS — R519 Headache, unspecified: Secondary | ICD-10-CM

## 2015-06-12 DIAGNOSIS — R112 Nausea with vomiting, unspecified: Secondary | ICD-10-CM

## 2015-06-12 DIAGNOSIS — M5441 Lumbago with sciatica, right side: Secondary | ICD-10-CM

## 2015-06-12 LAB — POCT GLYCOSYLATED HEMOGLOBIN (HGB A1C): Hemoglobin A1C: 5.5

## 2015-06-12 LAB — GLUCOSE, POCT (MANUAL RESULT ENTRY): POC GLUCOSE: 96 mg/dL (ref 70–99)

## 2015-06-12 MED ORDER — CYCLOBENZAPRINE HCL 5 MG PO TABS: 5.0000 mg | ORAL_TABLET | Freq: Three times a day (TID) | ORAL | Status: DC | PRN

## 2015-06-12 MED ORDER — METOCLOPRAMIDE HCL 10 MG PO TABS: 10.0000 mg | ORAL_TABLET | Freq: Three times a day (TID) | ORAL | Status: DC | PRN

## 2015-06-12 NOTE — Progress Notes (Signed)
Patient here for follow up Complains of having chronic back pain and  Now has been suffering from a headache the past week

## 2015-06-12 NOTE — Progress Notes (Signed)
Patient ID: Karina Murphy, female   DOB: 03-09-1981, 34 y.o.   MRN: [id number]  CC: back pain, headaches  HPI: Karina Murphy is a 34 y.o. female here today for a follow up visit.  Patient has past medical history of rheumatoid arthritis and anxiety. Patient reports that she has been having chronic back pain since she has been diagnosed with rheumatoid. She has tried diclofenac for pain. She now states that the diclofenac does not help as much as it did in the past. She reports that she has not been back to her Rheumatologist because she did not have transportation or money to return. Back pain is located on the right side and radiates to the middle of her right thigh causing her legs to go numb. She reports a headache for the 4 weeks. Headache is located in the right temple that feels like a pressure and stabbing. She reports thaat she has has been having associated nausea, vomiting, dizzy, blurred vision, thirst.   No Known Allergies Past Medical History  Diagnosis Date  . Hypertension   . Arthritis    Current Outpatient Prescriptions on File Prior to Visit  Medication Sig Dispense Refill  . diclofenac (VOLTAREN) 75 MG EC tablet Take 1 tablet (75 mg total) by mouth 2 (two) times daily. (Patient taking differently: Take 75 mg by mouth 2 (two) times daily as needed for moderate pain. ) 60 tablet 1  . sulfaSALAzine (AZULFIDINE) 500 MG EC tablet Take 500 mg by mouth 2 (two) times daily.    . diphenoxylate-atropine (LOMOTIL) 2.5-0.025 MG per tablet Take 1 tablet by mouth 4 (four) times daily as needed for diarrhea or loose stools. (Patient not taking: Reported on 12/30/2014) 30 tablet 0  . escitalopram (LEXAPRO) 10 MG tablet Take 1 tablet (10 mg total) by mouth daily. (Patient not taking: Reported on 01/17/2015) 30 tablet 4  . folic acid (FOLVITE) 1 MG tablet Take 1 mg by mouth daily.    Marland Kitchen LORazepam (ATIVAN) 0.5 MG tablet Take 1 tablet (0.5 mg total) by mouth daily as needed for anxiety.  (Patient not taking: Reported on 06/12/2015) 30 tablet 0  . meclizine (ANTIVERT) 25 MG tablet Take 1 tablet (25 mg total) by mouth 3 (three) times daily as needed for dizziness or nausea. (Patient not taking: Reported on 01/17/2015) 30 tablet 0  . methotrexate (RHEUMATREX) 2.5 MG tablet Take 15 mg by mouth once a week. Caution:Chemotherapy. Protect from light.    Marland Kitchen omeprazole (PRILOSEC) 20 MG capsule Take 1 capsule (20 mg total) by mouth daily. (Patient not taking: Reported on 06/12/2015) 30 capsule 0  . ondansetron (ZOFRAN) 8 MG tablet Take 1 tablet (8 mg total) by mouth every 8 (eight) hours as needed for nausea. (Patient not taking: Reported on 01/17/2015) 10 tablet 0  . predniSONE (STERAPRED UNI-PAK) 10 MG tablet Take by mouth daily. 6-5-4-3-2-1. Take 6 pills today and decrease by one tablet per day (Patient not taking: Reported on 01/17/2015) 21 tablet 0  . promethazine (PHENERGAN) 25 MG tablet Take 1 tablet (25 mg total) by mouth every 6 (six) hours as needed for nausea or vomiting. (Patient not taking: Reported on 06/12/2015) 12 tablet 0  . traMADol (ULTRAM) 50 MG tablet Take 1 tablet (50 mg total) by mouth every 12 (twelve) hours as needed for severe pain. (Patient not taking: Reported on 12/30/2014) 60 tablet 0   No current facility-administered medications on file prior to visit.   Family History  Problem Relation Age of Onset  .  Diabetes Mother   . Hypertension Mother    Social History   Social History  . Marital Status: Single    Spouse Name: N/A  . Number of Children: N/A  . Years of Education: N/A   Occupational History  . Not on file.   Social History Main Topics  . Smoking status: Never Smoker   . Smokeless tobacco: Never Used  . Alcohol Use: No  . Drug Use: No  . Sexual Activity: Yes    Birth Control/ Protection: Condom   Other Topics Concern  . Not on file   Social History Narrative    Review of Systems: Other than what is stated in HPI, all other systems are  negative.   Objective:   Filed Vitals:   06/12/15 1506  BP: 123/86  Pulse: 82  Temp: 98 F (36.7 C)  Resp: 16    Physical Exam  Constitutional: She is oriented to person, place, and time.  Neck: Normal range of motion.  Cardiovascular: Normal rate, regular rhythm and normal heart sounds.   Pulmonary/Chest: Effort normal and breath sounds normal.  Abdominal: Soft. Bowel sounds are normal. She exhibits no distension. There is no tenderness.  Musculoskeletal: She exhibits no tenderness.  Neurological: She is alert and oriented to person, place, and time. No cranial nerve deficit.  Skin: Skin is warm and dry.     Lab Results  Component Value Date   WBC 8.6 04/13/2015   HGB 13.1 04/13/2015   HCT 40.3 04/13/2015   MCV 88.8 04/13/2015   PLT 345 04/13/2015   Lab Results  Component Value Date   CREATININE 0.79 04/13/2015   BUN 7 04/13/2015   NA 136 04/13/2015   K 3.8 04/13/2015   CL 100* 04/13/2015   CO2 22 04/13/2015    Lab Results  Component Value Date   HGBA1C 5.7* 12/26/2013   Lipid Panel  No results found for: CHOL, TRIG, HDL, CHOLHDL, VLDL, LDLCALC     Assessment and plan:   Karina Murphy was seen today for back pain.  Diagnoses and all orders for this visit:  Nonintractable headache, unspecified chronicity pattern, unspecified headache type -     POCT glucose (manual entry) -     POCT glycosylated hemoglobin (Hb A1C)--5.5 Patient given Reglan and Flexeril which will both help her headache.  Non-intractable vomiting with nausea, vomiting of unspecified type -     metoCLOPramide (REGLAN) 10 MG tablet; Take 1 tablet (10 mg total) by mouth every 8 (eight) hours as needed for nausea or vomiting. -     Ambulatory referral to Gastroenterology- Nausea is chronic and she needs to return to GI  Right-sided low back pain with right-sided sciatica -     cyclobenzaprine (FLEXERIL) 5 MG tablet; Take 1 tablet (5 mg total) by mouth 3 (three) times daily as needed for  muscle spasms. Back pain and/or headaches May use heat  Due to language barrier, an interpreter was present during the history-taking and subsequent discussion (and for part of the physical exam) with this patient.   Return if symptoms worsen or fail to improve.   Ambrose Finland, NP-C [facility] [phone] 06/12/2015, 3:35 PM

## 2015-06-12 NOTE — Patient Instructions (Signed)
Citica  (Sciatica)  La citica es el dolor, debilidad, entumecimiento u hormigueo a lo largo del nervio citico. El nervio comienza en la zona inferior de la espalda y desciende por la parte posterior de cada pierna. El nervio controla los msculos de la parte inferior de la pierna y de la zona posterior de la rodilla, y transmite la sensibilidad a la parte posterior del muslo, la pierna y la planta del pie. La citica es un sntoma de otras afecciones mdicas. Por ejemplo, un dao a los nervios o algunas enfermedades como un disco herniado o un espoln seo en la columna vertebral, podran daarle o presionar en el nervio citico. Esto causa dolor, debilidad y otras sensaciones normalmente asociadas con la citica. Generalmente la citica afecta slo un lado del cuerpo. CAUSAS   Disco herniado o desplazado.  Enfermedad degenerativa del disco.  Un sndrome doloroso que compromete un msculo angosto de los glteos (sndrome piriforme).  Lesin o fractura plvica.  Embarazo.  Tumor (casos raros). SNTOMAS  Los sntomas pueden variar de leves a muy graves. Por lo general, los sntomas descienden desde la zona lumbar a las nalgas y la parte posterior de la pierna. Ellos son:   Hormigueo leve o dolor sordo en la parte inferior de la espalda, la pierna o la cadera.  Adormecimiento en la parte posterior de la pantorrilla o la planta del pie.  Sensacin de quemazn en la zona lumbar, la pierna o la cadera.  Dolor agudo en la zona inferior de la espalda, la pierna o la cadera.  Debilidad en las piernas.  Dolor de espalda intenso que inhibe los movimientos. Los sntomas pueden empeorar al toser, estornudar, rer o estar sentado o parado durante mucho tiempo. Adems, el sobrepeso puede empeorar los sntomas.  DIAGNSTICO  Su mdico le har un examen fsico para buscar los sntomas comunes de la citica. Le pedir que haga algunos movimientos o actividades que activaran el dolor del nervio  citico. Para encontrar las causas de la citica podr indicarle otros estudios. Estos pueden ser:   Anlisis de sangre.  Radiografas.  Pruebas de diagnstico por imgenes, como resonancia magntica o tomografa computada. TRATAMIENTO  El tratamiento se dirige a las causas de la citica. A veces, el tratamiento no es necesario, y el dolor y el malestar desaparecen por s mismos. Si necesita tratamiento, su mdico puede sugerir:   Medicamentos de venta libre para aliviar el dolor.  Medicamentos recetados, como antiinflamatorios, relajantes musculares o narcticos.  Aplicacin de calor o hielo en la zona del dolor.  Inyecciones de corticoides para disminuir el dolor, la irritacin y la inflamacin alrededor del nervio.  Reduccin de la actividad en los perodos de dolor.  Ejercicios y estiramiento del abdomen para fortalecer y mejorar la flexibilidad de la columna vertebral. Su mdico puede sugerirle perder peso si el peso extra empeora el dolor de espalda.  Fisioterapia.  La ciruga para eliminar lo que presiona o pincha el nervio, como un espoln seo o parte de una hernia de disco. INSTRUCCIONES PARA EL CUIDADO EN EL HOGAR   Slo tome medicamentos de venta libre o recetados para calmar el dolor o el malestar, segn las indicaciones de su mdico.  Aplique hielo sobre el rea dolorida durante 20 minutos 3-4 veces por da durante los primeras 48-72 horas. Luego intente aplicar calor de la misma manera.  Haga ejercicios, elongue o realice sus actividades habituales, si no le causan ms dolor.  Cumpla con todas las sesiones de fisioterapia, segn le   indique su mdico.  Cumpla con todas las visitas de control, segn le indique su mdico.  No use tacones altos o zapatos que no tengan buen apoyo.  Verifique que el colchn no sea muy blando. Un colchn firme aliviar el dolor y las molestias. SOLICITE ATENCIN MDICA DE INMEDIATO SI:   Pierde el control de la vejiga o del intestino  (incontinencia).  Aumenta la debilidad en la zona inferior de la espalda, la pelvis, las nalgas o las piernas.  Siente irritacin o inflamacin en la espalda.  Tiene sensacin de ardor al orinar.  El dolor empeora cuando se acuesta o lo despierta por la noche.  El dolor es peor del que experiment en el pasado.  Dura ms de 4 semanas.  Pierde peso sin motivo de manera sbita. ASEGRESE DE QUE:   Comprende estas instrucciones.  Controlar su enfermedad.  Solicitar ayuda de inmediato si no mejora o si empeora. Document Released: 08/30/2005 Document Revised: 02/29/2012 [employer] Patient Information 2015 [employer]. This information is not intended to replace advice given to you by your health care provider. Make sure you discuss any questions you have with your health care provider.  

## 2015-06-25 ENCOUNTER — Encounter: Payer: Self-pay | Admitting: Gastroenterology

## 2015-07-02 ENCOUNTER — Emergency Department (HOSPITAL_COMMUNITY): Payer: Self-pay

## 2015-07-02 ENCOUNTER — Encounter (HOSPITAL_COMMUNITY): Payer: Self-pay | Admitting: Emergency Medicine

## 2015-07-02 ENCOUNTER — Emergency Department (HOSPITAL_COMMUNITY)
Admission: EM | Admit: 2015-07-02 | Discharge: 2015-07-03 | Disposition: A | Discharge status: Home / Self Care | Payer: Self-pay | Attending: Emergency Medicine | Admitting: Emergency Medicine

## 2015-07-02 DIAGNOSIS — G43919 Migraine, unspecified, intractable, without status migrainosus: Secondary | ICD-10-CM

## 2015-07-02 DIAGNOSIS — M1990 Unspecified osteoarthritis, unspecified site: Secondary | ICD-10-CM | POA: Insufficient documentation

## 2015-07-02 DIAGNOSIS — I10 Essential (primary) hypertension: Secondary | ICD-10-CM | POA: Insufficient documentation

## 2015-07-02 DIAGNOSIS — Z79899 Other long term (current) drug therapy: Secondary | ICD-10-CM | POA: Insufficient documentation

## 2015-07-02 MED ORDER — SODIUM CHLORIDE 0.9 % IV BOLUS (SEPSIS)
1000.0000 mL | Freq: Once | INTRAVENOUS | Status: AC
  Administered 2015-07-02: 1000 mL via INTRAVENOUS

## 2015-07-02 MED ORDER — DEXAMETHASONE SODIUM PHOSPHATE 10 MG/ML IJ SOLN
10.0000 mg | Freq: Once | INTRAMUSCULAR | Status: AC
  Administered 2015-07-02: 10 mg via INTRAVENOUS
  Filled 2015-07-02: qty 1

## 2015-07-02 MED ORDER — DIPHENHYDRAMINE HCL 50 MG/ML IJ SOLN
25.0000 mg | Freq: Once | INTRAMUSCULAR | Status: AC
  Administered 2015-07-02: 25 mg via INTRAVENOUS
  Filled 2015-07-02: qty 1

## 2015-07-02 MED ORDER — METOCLOPRAMIDE HCL 5 MG/ML IJ SOLN
10.0000 mg | Freq: Once | INTRAMUSCULAR | Status: AC
  Administered 2015-07-02: 10 mg via INTRAVENOUS
  Filled 2015-07-02: qty 2

## 2015-07-02 NOTE — ED Provider Notes (Signed)
CSN: [id number]     Arrival date & time 07/02/15  2129 History   First MD Initiated Contact with Patient 07/02/15 2216     Chief Complaint  Patient presents with  . Headache    Patient is a 34 y.o. female presenting with headaches.  Headache Pain location:  Generalized Quality:  Dull Severity currently:  10/10 Severity at highest:  10/10 Onset quality:  Gradual Duration:  3 days Timing:  Constant Progression:  Worsening Chronicity:  New Similar to prior headaches: no   Associated symptoms: blurred vision, photophobia, vomiting and weakness   Associated symptoms: no abdominal pain, no fever and no neck stiffness     Past Medical History  Diagnosis Date  . Hypertension   . Arthritis    History reviewed. No pertinent past surgical history. Family History  Problem Relation Age of Onset  . Diabetes Mother   . Hypertension Mother    Social History  Substance Use Topics  . Smoking status: Never Smoker   . Smokeless tobacco: Never Used  . Alcohol Use: No   OB History    No data available     Review of Systems  Constitutional: Negative for fever.  Eyes: Positive for blurred vision and photophobia.  Gastrointestinal: Positive for vomiting. Negative for abdominal pain.  Musculoskeletal: Negative for neck stiffness.  Neurological: Positive for weakness and headaches.  All other systems reviewed and are negative.     Allergies  Review of patient's allergies indicates no known allergies.  Home Medications   Prior to Admission medications   Medication Sig Start Date End Date Taking? Authorizing Provider  cyclobenzaprine (FLEXERIL) 5 MG tablet Take 1 tablet (5 mg total) by mouth 3 (three) times daily as needed for muscle spasms. Back pain and/or headaches 06/12/15   Ambrose Finland, NP  diclofenac (VOLTAREN) 75 MG EC tablet Take 1 tablet (75 mg total) by mouth 2 (two) times daily. Patient taking differently: Take 75 mg by mouth 2 (two) times daily as needed for moderate  pain.  04/03/15   Ambrose Finland, NP  escitalopram (LEXAPRO) 10 MG tablet Take 1 tablet (10 mg total) by mouth daily. Patient not taking: Reported on 01/17/2015 11/13/14   Ambrose Finland, NP  folic acid (FOLVITE) 1 MG tablet Take 1 mg by mouth daily.    Historical Provider, MD  methotrexate (RHEUMATREX) 2.5 MG tablet Take 15 mg by mouth once a week. Caution:Chemotherapy. Protect from light.    Historical Provider, MD  metoCLOPramide (REGLAN) 10 MG tablet Take 1 tablet (10 mg total) by mouth every 8 (eight) hours as needed for nausea or vomiting. 06/12/15   Ambrose Finland, NP  sulfaSALAzine (AZULFIDINE) 500 MG EC tablet Take 500 mg by mouth 2 (two) times daily. 02/06/15   Historical Provider, MD   BP 119/56 mmHg  Pulse 85  Temp(Src) 99.4 F (37.4 C) (Oral)  Resp 16  Ht 5\' 3"  (1.6 m)  Wt 201 lb (91.173 kg)  BMI 35.61 kg/m2  SpO2 99%  LMP 06/18/2015 Physical Exam  Constitutional: She is oriented to person, place, and time. She appears well-developed and well-nourished. No distress.  HENT:  Head: Normocephalic.  Eyes: EOM are normal. Pupils are equal, round, and reactive to light.  Neck: Normal range of motion. No thyromegaly present.  Cardiovascular: Normal rate.   Pulmonary/Chest: Effort normal. No respiratory distress.  Abdominal: Soft. She exhibits no distension. There is no tenderness.  Musculoskeletal: She exhibits no edema.  Neurological: She is alert  and oriented to person, place, and time. No cranial nerve deficit. She exhibits normal muscle tone. Coordination normal.  Normal finger to nose/no dysmetria. Normal heel to shin  Skin: Skin is warm and dry. She is not diaphoretic.  Psychiatric: Her behavior is normal.  Nursing note and vitals reviewed.   ED Course  Procedures (including critical care time)  Imaging Review Ct Head Wo Contrast  07/02/2015  CLINICAL DATA:  Headaches with nausea and vomiting EXAM: CT HEAD WITHOUT CONTRAST TECHNIQUE: Contiguous axial images were obtained  from the base of the skull through the vertex without intravenous contrast. COMPARISON:  None. FINDINGS: The bony calvarium is intact. The ventricles are of normal size and configuration. No findings to suggest acute hemorrhage, acute infarction or space-occupying mass lesion are noted. IMPRESSION: No acute intracranial abnormality noted. Electronically Signed   By: Alcide Clever M.D.   On: 07/02/2015 23:30   MDM   Patient presents emergency department today with 3 day, gradual in onset global headache is associated with lightheadedness, photophobia, nausea and episodes of vomiting. Patient denies any head injury denies any fevers or chills. Patient has no nuchal rigidity on exam. She does have difficulty with following occulomotor exam and because it makes her more lightheaded. Otherwise, no neurological deficit. No meningeal signs on exam.    CT negative. Patient given migraine cocktail with resolution of pain. Patient will have follow up with her PCP. Patient in agreement with plan.    Final diagnoses:  None    Deirdre Peer, MD 07/03/15 1103  Pricilla Loveless, MD 07/03/15 1594

## 2015-07-02 NOTE — Discharge Instructions (Signed)
Cefalea migrañosa  (Migraine Headache)  Una cefalea migrañosa es un dolor intenso y punzante en uno o ambos lados de la cabeza. La migraña puede durar desde 30 minutos hasta varias horas.  CAUSAS   No siempre se conoce la causa exacta de la cefalea migrañosa. Sin embargo, pueden aparecer cuando los nervios del cerebro se irritan y liberan ciertas sustancias químicas que causan inflamación. Esto ocasiona dolor.  Existen también ciertos factores que pueden desencadenar las migrañas, como los siguientes:  · Alcohol.  · Fumar.  · Estrés.  · La menstruación  · Quesos añejados.  · Los alimentos o las bebidas que contienen nitratos, glutamato, aspartamo o tiramina.  · Falta de sueño.  · Chocolate.  · Cafeína.  · Hambre.  · Actividad física extenuante.  · Fatiga.  · Medicamentos que se usan para tratar el dolor en el pecho (nitroglicerina), píldoras anticonceptivas, estrógeno y algunos medicamentos para la hipertensión arterial.  SIGNOS Y SÍNTOMAS  · Dolor en uno o ambos lados de la cabeza.  · Dolor pulsante o punzante.  · Dolor intenso que impide realizar las actividades diarias.  · Dolor que se agrava por cualquier actividad física.  · Náuseas, vómitos o ambos.  · Mareos.  · Dolor con la exposición a las luces brillantes, a los ruidos fuertes o la actividad.  · Sensibilidad general a las luces brillantes, a los ruidos fuertes o a los olores.  Antes de sufrir una migraña, puede recibir señales de advertencia (aura). Un aura puede incluir:  · Ver las luces intermitentes.  · Ver puntos brillantes, halos o líneas en zigzag.  · Tener una visión en túnel o visión borrosa.  · Sensación de entumecimiento u hormigueo.  · Dificultad para hablar  · Debilidad muscular.  DIAGNÓSTICO   La cefalea migrañosa se diagnostica en función de lo siguiente:  · Síntomas.  · Examen físico.  · Una TC (tomografía computada) o resonancia magnética de la cabeza. Estas pruebas de diagnóstico por imagen no pueden diagnosticar las migrañas, pero pueden  ayudar a descartar otras causas de las cefaleas.  TRATAMIENTO  Le prescribirán medicamentos para aliviar el dolor y las náuseas. También podrán administrarse medicamentos para ayudar a prevenir las migrañas recurrentes.   INSTRUCCIONES PARA EL CUIDADO EN EL HOGAR  · Sólo tome medicamentos de venta libre o recetados para calmar el dolor o el malestar, según las indicaciones de su médico. No se recomienda usar los opiáceos a largo plazo.  · Cuando tenga la migraña, acuéstese en un cuarto oscuro y tranquilo  · Lleve un registro diario para averiguar lo que puede provocar las cefaleas migrañosas. Por ejemplo, escriba:    Lo que usted come y bebe.    Cuánto tiempo duerme.    Algún cambio en su dieta o en los medicamentos.  · Limite el consumo de bebidas alcohólicas.  · Si fuma, deje de hacerlo.  · Duerma entre 7 y 9 horas, o según las recomendaciones del médico.  · Limite el estrés.  · Mantenga las luces tenues si le molestan las luces brillantes y la migraña empeora.  SOLICITE ATENCIÓN MÉDICA DE INMEDIATO SI:   · La migraña se hace cada vez más intensa.  · Tiene fiebre.  · Presenta rigidez en el cuello.  · Tiene pérdida de visión.  · Presenta debilidad muscular o pérdida del control muscular.  · Comienza a perder el equilibrio o tiene problemas para caminar.  · Sufre mareos o se desmaya.  · Tiene síntomas graves que son diferentes   a los primeros síntomas.  ASEGÚRESE DE QUE:   · Comprende estas instrucciones.  · Controlará su afección.  · Recibirá ayuda de inmediato si no mejora o si empeora.     Esta información no tiene como fin reemplazar el consejo del médico. Asegúrese de hacerle al médico cualquier pregunta que tenga.     Document Released: 08/30/2005 Document Revised: 06/20/2013  Elsevier Interactive Patient Education ©2016 [employer]

## 2015-07-02 NOTE — ED Notes (Addendum)
Pt. reports headache with nausea/emesis , lightheaded , photophobia and blurred vision onset this week , denies head injury , no fever or chills.

## 2015-07-16 ENCOUNTER — Ambulatory Visit: Payer: Self-pay | Attending: Internal Medicine | Admitting: Internal Medicine

## 2015-07-16 ENCOUNTER — Encounter: Payer: Self-pay | Admitting: Internal Medicine

## 2015-07-16 VITALS — BP 115/77 | HR 74 | Temp 98.7°F | Resp 17 | Ht 63.0 in | Wt 198.8 lb

## 2015-07-16 DIAGNOSIS — H9202 Otalgia, left ear: Secondary | ICD-10-CM

## 2015-07-16 DIAGNOSIS — G43019 Migraine without aura, intractable, without status migrainosus: Secondary | ICD-10-CM

## 2015-07-16 MED ORDER — SUMATRIPTAN SUCCINATE 25 MG PO TABS: 25.0000 mg | ORAL_TABLET | Freq: Four times a day (QID) | ORAL | Status: DC

## 2015-07-16 NOTE — Progress Notes (Signed)
Patient here for f/u from ED from 2 weeks ago Karina Murphy to the ED for persistent headaches Headaches have been intermittent for the last few weeks  Headaches started on the right side by the eye and will radiate to the back of the head and down the neck Patient rates headache pain as a 6

## 2015-07-16 NOTE — Progress Notes (Signed)
Patient ID: Karina Murphy, female   DOB: Jun 20, 1981, 34 y.o.   MRN: [id number]  CC: Headache  HPI: Karina Murphy is a 34 y.o. female here today for a follow up visit.  Patient has past medical history of rheumatoid arthritis. Patient was seen in ER last month for symptoms of headaches that were not improved with NSAID's. Today she reports  Headache Pain location: behind right eye radiating to right occipital Quality: Squeezing and throbbing Severity currently: 10/10 Severity at highest: 10/10 Onset quality: Gradual Duration: 2 days Timing: usually wakes up with headache and last all day Progression: Worsening Chronicity: New Similar to prior headaches: no  Associated symptoms: blurred vision, photophobia, vomiting, aggravation, and weakness  Associated symptoms: no abdominal pain, no fever and no neck stiffness  Treatments tried: diclofenac, Tylenol, and Excedrin all with very mild relief.  No Known Allergies Past Medical History  Diagnosis Date  . Hypertension   . Arthritis    Current Outpatient Prescriptions on File Prior to Visit  Medication Sig Dispense Refill  . cyclobenzaprine (FLEXERIL) 5 MG tablet Take 1 tablet (5 mg total) by mouth 3 (three) times Murphy as needed for muscle spasms. Back pain and/or headaches 30 tablet 1  . diclofenac (VOLTAREN) 75 MG EC tablet Take 1 tablet (75 mg total) by mouth 2 (two) times Murphy. (Patient taking differently: Take 150 mg by mouth Murphy at 12 noon. ) 60 tablet 1  . escitalopram (LEXAPRO) 10 MG tablet Take 1 tablet (10 mg total) by mouth Murphy. (Patient not taking: Reported on 01/17/2015) 30 tablet 4  . metoCLOPramide (REGLAN) 10 MG tablet Take 1 tablet (10 mg total) by mouth every 8 (eight) hours as needed for nausea or vomiting. 30 tablet 1  . sulfaSALAzine (AZULFIDINE) 500 MG EC tablet Take 500 mg by mouth 2 (two) times Murphy.     No current facility-administered medications on file prior to visit.   Family  History  Problem Relation Age of Onset  . Diabetes Mother   . Hypertension Mother    Social History   Social History  . Marital Status: Single    Spouse Name: N/A  . Number of Children: N/A  . Years of Education: N/A   Occupational History  . Not on file.   Social History Main Topics  . Smoking status: Never Smoker   . Smokeless tobacco: Never Used  . Alcohol Use: No  . Drug Use: No  . Sexual Activity: Yes    Birth Control/ Protection: Condom   Other Topics Concern  . Not on file   Social History Narrative    Review of Systems: Other than what is stated in HPI, all other systems are negative.   Objective:   Filed Vitals:   07/16/15 1218  BP: 115/77  Pulse: 74  Temp: 98.7 F (37.1 C)  Resp: 17    Physical Exam  Constitutional: She is oriented to person, place, and time.  HENT:  Right Ear: External ear normal.  Left Ear: External ear normal.  Cardiovascular: Normal rate, regular rhythm and normal heart sounds.   Pulmonary/Chest: Effort normal and breath sounds normal.  Neurological: She is alert and oriented to person, place, and time. No cranial nerve deficit.     Lab Results  Component Value Date   WBC 8.6 04/13/2015   HGB 13.1 04/13/2015   HCT 40.3 04/13/2015   MCV 88.8 04/13/2015   PLT 345 04/13/2015   Lab Results  Component Value Date   CREATININE 0.79 04/13/2015  BUN 7 04/13/2015   NA 136 04/13/2015   K 3.8 04/13/2015   CL 100* 04/13/2015   CO2 22 04/13/2015    Lab Results  Component Value Date   HGBA1C 5.50 06/12/2015   Lipid Panel  No results found for: CHOL, TRIG, HDL, CHOLHDL, VLDL, LDLCALC     Assessment and plan:   [name] was seen today for headache.  Diagnoses and all orders for this visit:  Intractable migraine without aura and without status migrainosus -     Ambulatory referral to Neurology -     SUMAtriptan (IMITREX) 25 MG tablet; Take 1 tablet (25 mg total) by mouth every 6 (six) hours. May repeat in 2 hours if  headache persists or recurs.  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 Went over food restrictions and NSAID use  Due to language barrier, an interpreter was present during the history-taking and subsequent discussion (and for part of the physical exam) with this patient.  Return if symptoms worsen or fail to improve.       Ambrose Finland, NP-C [facility] [phone] 07/16/2015, 12:22 PM'

## 2015-07-16 NOTE — Patient Instructions (Signed)
Cefalea migrañosa  (Migraine Headache)  Una cefalea migrañosa es un dolor intenso y punzante en uno o ambos lados de la cabeza. La migraña puede durar desde 30 minutos hasta varias horas.  CAUSAS   No siempre se conoce la causa exacta de la cefalea migrañosa. Sin embargo, pueden aparecer cuando los nervios del cerebro se irritan y liberan ciertas sustancias químicas que causan inflamación. Esto ocasiona dolor.  Existen también ciertos factores que pueden desencadenar las migrañas, como los siguientes:  · Alcohol.  · Fumar.  · Estrés.  · La menstruación  · Quesos añejados.  · Los alimentos o las bebidas que contienen nitratos, glutamato, aspartamo o tiramina.  · Falta de sueño.  · Chocolate.  · Cafeína.  · Hambre.  · Actividad física extenuante.  · Fatiga.  · Medicamentos que se usan para tratar el dolor en el pecho (nitroglicerina), píldoras anticonceptivas, estrógeno y algunos medicamentos para la hipertensión arterial.  SIGNOS Y SÍNTOMAS  · Dolor en uno o ambos lados de la cabeza.  · Dolor pulsante o punzante.  · Dolor intenso que impide realizar las actividades diarias.  · Dolor que se agrava por cualquier actividad física.  · Náuseas, vómitos o ambos.  · Mareos.  · Dolor con la exposición a las luces brillantes, a los ruidos fuertes o la actividad.  · Sensibilidad general a las luces brillantes, a los ruidos fuertes o a los olores.  Antes de sufrir una migraña, puede recibir señales de advertencia (aura). Un aura puede incluir:  · Ver las luces intermitentes.  · Ver puntos brillantes, halos o líneas en zigzag.  · Tener una visión en túnel o visión borrosa.  · Sensación de entumecimiento u hormigueo.  · Dificultad para hablar  · Debilidad muscular.  DIAGNÓSTICO   La cefalea migrañosa se diagnostica en función de lo siguiente:  · Síntomas.  · Examen físico.  · Una TC (tomografía computada) o resonancia magnética de la cabeza. Estas pruebas de diagnóstico por imagen no pueden diagnosticar las migrañas, pero pueden  ayudar a descartar otras causas de las cefaleas.  TRATAMIENTO  Le prescribirán medicamentos para aliviar el dolor y las náuseas. También podrán administrarse medicamentos para ayudar a prevenir las migrañas recurrentes.   INSTRUCCIONES PARA EL CUIDADO EN EL HOGAR  · Sólo tome medicamentos de venta libre o recetados para calmar el dolor o el malestar, según las indicaciones de su médico. No se recomienda usar los opiáceos a largo plazo.  · Cuando tenga la migraña, acuéstese en un cuarto oscuro y tranquilo  · Lleve un registro diario para averiguar lo que puede provocar las cefaleas migrañosas. Por ejemplo, escriba:    Lo que usted come y bebe.    Cuánto tiempo duerme.    Algún cambio en su dieta o en los medicamentos.  · Limite el consumo de bebidas alcohólicas.  · Si fuma, deje de hacerlo.  · Duerma entre 7 y 9 horas, o según las recomendaciones del médico.  · Limite el estrés.  · Mantenga las luces tenues si le molestan las luces brillantes y la migraña empeora.  SOLICITE ATENCIÓN MÉDICA DE INMEDIATO SI:   · La migraña se hace cada vez más intensa.  · Tiene fiebre.  · Presenta rigidez en el cuello.  · Tiene pérdida de visión.  · Presenta debilidad muscular o pérdida del control muscular.  · Comienza a perder el equilibrio o tiene problemas para caminar.  · Sufre mareos o se desmaya.  · Tiene síntomas graves que son diferentes   a los primeros síntomas.  ASEGÚRESE DE QUE:   · Comprende estas instrucciones.  · Controlará su afección.  · Recibirá ayuda de inmediato si no mejora o si empeora.     Esta información no tiene como fin reemplazar el consejo del médico. Asegúrese de hacerle al médico cualquier pregunta que tenga.     Document Released: 08/30/2005 Document Revised: 06/20/2013  Elsevier Interactive Patient Education ©2016 Elsevier Inc.

## 2015-08-22 ENCOUNTER — Encounter: Payer: Self-pay | Admitting: Gastroenterology

## 2015-08-22 ENCOUNTER — Other Ambulatory Visit (INDEPENDENT_AMBULATORY_CARE_PROVIDER_SITE_OTHER): Payer: Self-pay

## 2015-08-22 ENCOUNTER — Ambulatory Visit (INDEPENDENT_AMBULATORY_CARE_PROVIDER_SITE_OTHER): Payer: Self-pay | Admitting: Gastroenterology

## 2015-08-22 VITALS — BP 118/76 | HR 76 | Ht 62.75 in | Wt 198.5 lb

## 2015-08-22 DIAGNOSIS — R112 Nausea with vomiting, unspecified: Secondary | ICD-10-CM

## 2015-08-22 LAB — CBC WITH DIFFERENTIAL/PLATELET
BASOS PCT: 0.2 % (ref 0.0–3.0)
Basophils Absolute: 0 10*3/uL (ref 0.0–0.1)
Eosinophils Absolute: 0.2 10*3/uL (ref 0.0–0.7)
Eosinophils Relative: 3.4 % (ref 0.0–5.0)
HEMATOCRIT: 36.5 % (ref 36.0–46.0)
Hemoglobin: 12 g/dL (ref 12.0–15.0)
LYMPHS ABS: 2 10*3/uL (ref 0.7–4.0)
LYMPHS PCT: 28 % (ref 12.0–46.0)
MCHC: 32.9 g/dL (ref 30.0–36.0)
MCV: 85.4 fl (ref 78.0–100.0)
Monocytes Absolute: 0.5 10*3/uL (ref 0.1–1.0)
Monocytes Relative: 6.8 % (ref 3.0–12.0)
NEUTROS ABS: 4.5 10*3/uL (ref 1.4–7.7)
NEUTROS PCT: 61.6 % (ref 43.0–77.0)
PLATELETS: 350 10*3/uL (ref 150.0–400.0)
RBC: 4.28 Mil/uL (ref 3.87–5.11)
RDW: 15.6 % — AB (ref 11.5–15.5)
WBC: 7.3 10*3/uL (ref 4.0–10.5)

## 2015-08-22 LAB — TSH: TSH: 4.64 u[IU]/mL — ABNORMAL HIGH (ref 0.35–4.50)

## 2015-08-22 LAB — COMPREHENSIVE METABOLIC PANEL
ALT: 10 U/L (ref 0–35)
AST: 11 U/L (ref 0–37)
Albumin: 4.3 g/dL (ref 3.5–5.2)
Alkaline Phosphatase: 58 U/L (ref 39–117)
BILIRUBIN TOTAL: 0.4 mg/dL (ref 0.2–1.2)
BUN: 16 mg/dL (ref 6–23)
CO2: 30 mEq/L (ref 19–32)
CREATININE: 0.9 mg/dL (ref 0.40–1.20)
Calcium: 9.8 mg/dL (ref 8.4–10.5)
Chloride: 101 mEq/L (ref 96–112)
GFR: 76.71 mL/min (ref >60.00)
Glucose, Bld: 98 mg/dL (ref 70–99)
Potassium: 4.1 mEq/L (ref 3.5–5.1)
Sodium: 139 mEq/L (ref 135–145)
Total Protein: 7.9 g/dL (ref 6.0–8.3)

## 2015-08-22 LAB — LIPASE: LIPASE: 7 U/L — AB (ref 11.0–59.0)

## 2015-08-22 NOTE — Progress Notes (Signed)
Karina Murphy    [id number]    [birth year]  Primary Care Physician:Valerie Joellen [place], NP  Referring Physician: Ambrose Finland, NP [address] [place], [place] [address]  Chief complaint:   vomiting  HPI:  34 year old female with history of rheumatoid arthritis here with complaints of progressive worsening of vomiting in the past 6 months. She is was started on methotrexate about 8 months ago and she stopped it a month ago, thought her symptoms could be related to it. She also takes 1-2 diclofenac twice daily for pain due to her arthritis. She can only tolerate Bland rice and throws up immediately when she eats anything greasy. She has been throwing up every day for the past 1 month. No hematemesis. She has history of chronic migraine. No history of diabetes. Denies marijuana, alcohol or any herbal remedies   Outpatient Encounter Prescriptions as of 08/22/2015  Medication Sig  . cyclobenzaprine (FLEXERIL) 5 MG tablet Take 1 tablet (5 mg total) by mouth 3 (three) times daily as needed for muscle spasms. Back pain and/or headaches  . diclofenac (VOLTAREN) 75 MG EC tablet Take 1 tablet (75 mg total) by mouth 2 (two) times daily. (Patient taking differently: Take 150 mg by mouth daily at 12 noon. )  . metoCLOPramide (REGLAN) 10 MG tablet Take 1 tablet (10 mg total) by mouth every 8 (eight) hours as needed for nausea or vomiting.  . sulfaSALAzine (AZULFIDINE) 500 MG EC tablet Take 500 mg by mouth 2 (two) times daily.  . SUMAtriptan (IMITREX) 25 MG tablet Take 1 tablet (25 mg total) by mouth every 6 (six) hours. May repeat in 2 hours if headache persists or recurs.  . [DISCONTINUED] escitalopram (LEXAPRO) 10 MG tablet Take 1 tablet (10 mg total) by mouth daily. (Patient not taking: Reported on 01/17/2015)   No facility-administered encounter medications on file as of 08/22/2015.    Allergies as of 08/22/2015  . (No Known Allergies)    Past Medical History  Diagnosis Date    . Hypertension   . Arthritis, rheumatoid (HCC)   . Anxiety   . Chronic headaches     Past Surgical History  Procedure Laterality Date  . No past surgeries      Family History  Problem Relation Age of Onset  . Diabetes Mother   . Hypertension Mother     Social History   Social History  . Marital Status: Single    Spouse Name: N/A  . Number of Children: 2  . Years of Education: N/A   Occupational History  . Not on file.   Social History Main Topics  . Smoking status: Never Smoker   . Smokeless tobacco: Never Used  . Alcohol Use: No  . Drug Use: No  . Sexual Activity: Yes    Birth Control/ Protection: Condom   Other Topics Concern  . Not on file   Social History Narrative      Review of systems: Review of Systems  Constitutional: Negative for fever and chills.  HENT: Negative.   Eyes: Negative for blurred vision.  Respiratory: Negative for cough, shortness of breath and wheezing.   Cardiovascular: Negative for chest pain and palpitations.  Gastrointestinal: as per HPI Genitourinary: Negative for dysuria, urgency, frequency and hematuria.  Musculoskeletal: Negative for myalgias, back pain and joint pain.  Skin: Negative for itching and rash.  Neurological: Negative for dizziness, tremors, focal weakness, seizures and loss of consciousness.  Endo/Heme/Allergies: Negative  for environmental allergies.  Psychiatric/Behavioral: Negative for depression, suicidal ideas and hallucinations.  All other systems reviewed and are negative.   Physical Exam: Filed Vitals:   08/22/15 1529  BP: 118/76  Pulse: 76   Gen:      No acute distress HEENT:  EOMI, sclera anicteric Neck:     No masses; no thyromegaly Lungs:    Clear to auscultation bilaterally; normal respiratory effort CV:         Regular rate and rhythm; no murmurs Abd:      + bowel sounds; soft, non-tender; no palpable masses, no distension Ext:    No edema; adequate peripheral perfusion Skin:       Warm and dry; no rash Neuro: alert and oriented x 3 Psych: normal mood and affect  Data Reviewed: CT head 2016 No acute intracranial abnormality noted.  CT abd & pelvis 03/2015 Lower chest: Lung bases are clear.  Hepatobiliary: No focal hepatic lesion. No biliary duct dilatation. Gallbladder is normal. Common bile duct is normal.  Pancreas: Pancreas is normal. No ductal dilatation. No pancreatic inflammation.  Spleen: Normal spleen  Adrenals/urinary tract: Adrenal glands and kidneys are normal. The ureters and bladder normal.  Stomach/Bowel: Stomach, small bowel, appendix, and cecum are normal. The colon and rectosigmoid colon are normal.  Vascular/Lymphatic: Abdominal aorta is normal caliber. There is no retroperitoneal or periportal lymphadenopathy. No pelvic lymphadenopathy.  Reproductive: Uterus ovaries are normal.  Musculoskeletal: No aggressive osseous lesion. Sclerosis along the SI joints noted.  RUQ USG 03/2015 1. No acute abnormality. 2. Hepatic steatosis.  NM GB EF 09/2014 No cystic duct obstruction. No CBD obstruction. Post Ensure gallbladder ejection fraction is 56.8%. Normal gallbladder ejection fraction should be greater than 33%.  Assessment and Plan/Recommendations:  34 year old female with history of chronic migraine and rheumatoid arthritis here with complaints of progressive worsening vomiting in past 6 months Check CBC, CMP and lipase level Schedule for upper endoscopy to evaluate for gastric ulcers in the setting of chronic NSAID use Instructed patient to avoid fiber and high fat diet, small frequent meals Based on EGD findings we will consider obtaining gastric emptying study to evaluate for possible gastroparesis Return in 1-2 months  K. Scherry Ran , MD [phone] Mon-Fri 8a-5p [phone] after 5p, weekends, holidays

## 2015-08-22 NOTE — Patient Instructions (Signed)
Go to the basement for labs today  You have been scheduled for an endoscopy. Please follow written instructions given to you at your visit today. If you use inhalers (even only as needed), please bring them with you on the day of your procedure. Your physician has requested that you go to www.startemmi.com and enter the access code given to you at your visit today. This web site gives a general overview about your procedure. However, you should still follow specific instructions given to you by our office regarding your preparation for the procedure.  Follow up in 2 months    Ir al stano para los laboratorios hoy  Se le ha programado una endoscopia. Por favor, siga las instrucciones escritas que le han dado en su visita hoy. Si [place] inhaladores (incluso si es necesario), por favor traerlos con usted el da de su procedimiento. Su mdico le ha pedido que vaya a www.startemmi.com e ingrese el cdigo de [employer] dio en su visita hoy. Este sitio web ofrece una descripcin general de su procedimiento. Sin embargo, usted debe seguir las instrucciones especficas que le han dado nuestra oficina con respecto a su preparacin para el procedimiento.  Seguimiento en 2 meses

## 2015-08-27 ENCOUNTER — Ambulatory Visit (INDEPENDENT_AMBULATORY_CARE_PROVIDER_SITE_OTHER): Payer: Self-pay | Admitting: Neurology

## 2015-08-27 ENCOUNTER — Encounter: Payer: Self-pay | Admitting: Neurology

## 2015-08-27 VITALS — BP 116/78 | HR 70 | Wt 196.0 lb

## 2015-08-27 DIAGNOSIS — G43019 Migraine without aura, intractable, without status migrainosus: Secondary | ICD-10-CM | POA: Insufficient documentation

## 2015-08-27 MED ORDER — SUMATRIPTAN SUCCINATE 100 MG PO TABS: ORAL_TABLET | ORAL | Status: DC

## 2015-08-27 MED ORDER — VENLAFAXINE HCL ER 37.5 MG PO TB24: ORAL_TABLET | ORAL | Status: DC

## 2015-08-27 NOTE — Patient Instructions (Addendum)
Migraa Recomendaciones: 1.  Inicie venlafaxina ER 37,5 mg. [place] 1 pldora diariamente con comida durante 7 Tovey, luego 2 pastillas diarias. Llame en 4 semanas con la actualizacin y podemos ajustar la dosis si es necesario. 2.  Tomar sumatriptn 100mg  en el inicio ms temprano de dolor de . Puede repetir la dosis una vez en 2 horas si es necesario. No exceda de dos tabletas en 24 horas. 3.  Limite el uso de analgsicos a no ms de 2 das de [place]. Estos medicamentos incluyen acetaminofeno, ibuprofeno, triptanos y narcticos. Esto ayudar a [employer] de dolores de cabeza de rebote. 4. Tenga en cuenta los desencadenantes comunes de los alimentos, como los dulces procesados, los alimentos procesados con nitritos (tales como carne deli, perritos calientes, salchichas), alimentos con MSG, alcohol (como el vino), chocolate, ciertos quesos, ciertas frutas Ctricos), vinagre, soda diettica. 4.  Evite la cafena 5.  Ejercicio de rutina 6.  Higiene adecuada del sueo 7.  Mantngase adecuadamente hidratado con agua 8.  Mantenga un diario de dolor de Software engineer. 9. Mantener una gestin adecuada del estrs. 10. No salte las comidas. 11. Considere suplementos: xido de magnesio 400mg  a 600mg  diario, riboflavina 400mg , Coenzyme Q 10 100mg  tres veces al da 12. Seguimiento en 3 meses, pero llame en 4 semanas con actualizacin.

## 2015-08-27 NOTE — Progress Notes (Signed)
NEUROLOGY CONSULTATION NOTE  Karina Murphy MRN: [id number] DOB: [birth year]  Referring provider: Holland Commons, NP Primary care provider: Holland Commons, NP  Reason for consult:  headache  HISTORY OF PRESENT ILLNESS: Karina Murphy is a 34 year old right-handed Spanish-speaking female with rheumatoid arthritis and HTN who presents for migraines.  Interpretor is present.  History obtained by patient and PCP note.  Imaging of head CT reviewed.  Onset:  Patient has rheumatoid arthritis.  She had been taking diclofenac for arthritic pain.  She was started on methotrexate several months ago.  Around that time, she developed intractable nausea and vomiting.  She stopped the methotrexate in June but the nausea and vomiting persisted.  Around 1.5 months ago, she started having headaches.  She was evaluated by GI and is scheduled for an endoscopy. Location:  Right peri-orbital and frontal, bi-temporal and radiating to back of head Quality:  Sharp/tension Intensity:  7-8/10 (10/10 when severe) Aura:  no Prodrome:  no Associated symptoms:  Nausea, vomiting, photophobia, phonophobia, blurred vision while driving Duration:  3 days Frequency:  10 days in past month Triggers/exacerbating factors:  none Relieving factors:  tea Activity:  Needs to lay down  Past abortive medication:  Advil, Tylenol, Excedrin Past preventative medication:  none Other past therapy:  none  Current abortive medication:  Sumatriptan 25mg , Reglan 10mg , cyclobenzaprine Antihypertensive medications:  none Antidepressant medications:  none Anticonvulsant medications:  none Vitamins/Herbal/Supplements:  none Other therapy:  none Other medication:  sulfasalazine  CT head from 07/02/15 was normal. Recent TSH was borderline elevated at 4.64.  Sleep hygiene:  poor Family history of headache:  No. Medical record reports history of migraines but she denies past history of any headaches.  PAST MEDICAL  HISTORY: Past Medical History  Diagnosis Date  . Hypertension   . Arthritis, rheumatoid (HCC)   . Anxiety   . Chronic headaches     PAST SURGICAL HISTORY: Past Surgical History  Procedure Laterality Date  . No past surgeries      MEDICATIONS: Current Outpatient Prescriptions on File Prior to Visit  Medication Sig Dispense Refill  . cyclobenzaprine (FLEXERIL) 5 MG tablet Take 1 tablet (5 mg total) by mouth 3 (three) times daily as needed for muscle spasms. Back pain and/or headaches 30 tablet 1  . diclofenac (VOLTAREN) 75 MG EC tablet Take 1 tablet (75 mg total) by mouth 2 (two) times daily. (Patient taking differently: Take 150 mg by mouth daily at 12 noon. ) 60 tablet 1  . metoCLOPramide (REGLAN) 10 MG tablet Take 1 tablet (10 mg total) by mouth every 8 (eight) hours as needed for nausea or vomiting. 30 tablet 1  . sulfaSALAzine (AZULFIDINE) 500 MG EC tablet Take 500 mg by mouth 2 (two) times daily.     No current facility-administered medications on file prior to visit.    ALLERGIES: No Known Allergies  FAMILY HISTORY: Family History  Problem Relation Age of Onset  . Diabetes Mother   . Hypertension Mother     SOCIAL HISTORY: Social History   Social History  . Marital Status: Single    Spouse Name: N/A  . Number of Children: 2  . Years of Education: N/A   Occupational History  . Not on file.   Social History Main Topics  . Smoking status: Never Smoker   . Smokeless tobacco: Never Used  . Alcohol Use: No  . Drug Use: No  . Sexual Activity: Yes    Birth Control/ Protection: Condom  Other Topics Concern  . Not on file   Social History Narrative   Completed 2nd grade.     REVIEW OF SYSTEMS: Constitutional: No fevers, chills, or sweats, no generalized fatigue, change in appetite Eyes: No visual changes, double vision, eye pain Ear, nose and throat: No hearing loss, ear pain, nasal congestion, sore throat Cardiovascular: No chest pain,  palpitations Respiratory:  No shortness of breath at rest or with exertion, wheezes GastrointestinaI: No nausea, vomiting, diarrhea, abdominal pain, fecal incontinence Genitourinary:  No dysuria, urinary retention or frequency Musculoskeletal:  No neck pain, back pain Integumentary: No rash, pruritus, skin lesions Neurological: as above Psychiatric: No depression, insomnia, anxiety Endocrine: No palpitations, fatigue, diaphoresis, mood swings, change in appetite, change in weight, increased thirst Hematologic/Lymphatic:  No anemia, purpura, petechiae. Allergic/Immunologic: no itchy/runny eyes, nasal congestion, recent allergic reactions, rashes  PHYSICAL EXAM: Filed Vitals:   08/27/15 0812  BP: 116/78  Pulse: 70   General: No acute distress.  Patient appears well-groomed.  Head:  Normocephalic/atraumatic Eyes:  fundi unremarkable, without vessel changes, exudates, hemorrhages or papilledema. Neck: supple, no paraspinal tenderness, full range of motion Back: No paraspinal tenderness Heart: regular rate and rhythm Lungs: Clear to auscultation bilaterally. Vascular: No carotid bruits. Neurological Exam: Mental status: alert and oriented to person, place, and time, recent and remote memory intact, fund of knowledge intact, attention and concentration intact, speech fluent and not dysarthric, language intact. Cranial nerves: CN I: not tested CN II: pupils equal, round and reactive to light, visual fields intact, fundi unremarkable, without vessel changes, exudates, hemorrhages or papilledema. CN III, IV, VI:  full range of motion, no nystagmus, no ptosis CN V: facial sensation intact CN VII: upper and lower face symmetric CN VIII: hearing intact CN IX, X: gag intact, uvula midline CN XI: sternocleidomastoid and trapezius muscles intact CN XII: tongue midline Bulk & Tone: normal, no fasciculations. Motor:  5/5 throughout Sensation: temperature and vibration sensation intact. Deep  Tendon Reflexes:  2+ throughout, toes downgoing.  Finger to nose testing:  Without dysmetria.  Heel to shin:  Without dysmetria.  Gait:  Normal station and stride.  Able to turn and tandem walk. Romberg negative.  IMPRESSION: Migraine without aura, possibly triggered by dehydration from nausea and vomiting  PLAN: 1.  Will start venlafaxine ER and titrate to 75mg  daily 2.  Increase sumatriptan to 100mg  3.  Hydration 4.  Sleep hygiene 5.  Follow up in 3 months.  She will call in 4 weeks with update.  Thank you for allowing me to take part in the care of this patient.  , DO  CC: [place], NP

## 2015-09-03 ENCOUNTER — Ambulatory Visit: Payer: Self-pay | Attending: Internal Medicine | Admitting: Internal Medicine

## 2015-09-03 ENCOUNTER — Encounter: Payer: Self-pay | Admitting: Internal Medicine

## 2015-09-03 VITALS — BP 119/81 | HR 91 | Temp 98.0°F | Resp 16 | Ht 63.0 in | Wt 196.0 lb

## 2015-09-03 DIAGNOSIS — M069 Rheumatoid arthritis, unspecified: Secondary | ICD-10-CM | POA: Insufficient documentation

## 2015-09-03 DIAGNOSIS — R51 Headache: Secondary | ICD-10-CM | POA: Insufficient documentation

## 2015-09-03 DIAGNOSIS — J392 Other diseases of pharynx: Secondary | ICD-10-CM | POA: Insufficient documentation

## 2015-09-03 DIAGNOSIS — K5900 Constipation, unspecified: Secondary | ICD-10-CM | POA: Insufficient documentation

## 2015-09-03 DIAGNOSIS — Z79899 Other long term (current) drug therapy: Secondary | ICD-10-CM | POA: Insufficient documentation

## 2015-09-03 DIAGNOSIS — I10 Essential (primary) hypertension: Secondary | ICD-10-CM | POA: Insufficient documentation

## 2015-09-03 DIAGNOSIS — R7989 Other specified abnormal findings of blood chemistry: Secondary | ICD-10-CM | POA: Insufficient documentation

## 2015-09-03 DIAGNOSIS — G43A0 Cyclical vomiting, not intractable: Secondary | ICD-10-CM | POA: Insufficient documentation

## 2015-09-03 DIAGNOSIS — R946 Abnormal results of thyroid function studies: Secondary | ICD-10-CM | POA: Insufficient documentation

## 2015-09-03 DIAGNOSIS — F329 Major depressive disorder, single episode, unspecified: Secondary | ICD-10-CM | POA: Insufficient documentation

## 2015-09-03 DIAGNOSIS — F419 Anxiety disorder, unspecified: Secondary | ICD-10-CM | POA: Insufficient documentation

## 2015-09-03 NOTE — Progress Notes (Signed)
Interpreter line used [place] ID# [id number] Patient states she is here for follow up States went to the GI doctor last week and their office Did some blood work and was told she had a problem with her Thyroid and was told to follow up with her primary doctor

## 2015-09-03 NOTE — Patient Instructions (Signed)
Hipotiroidismo (Hypothyroidism) El hipotiroidismo es un trastorno de la tiroides, una glndula grande ubicada en la parte anterior e inferior del cuello. La tiroides libera hormonas que controlan el funcionamiento del organismo. En los casos de hipotiroidismo, la glndula no produce la cantidad suficiente de estas hormonas. CAUSAS Las causas del hipotiroidismo pueden incluir lo siguiente:  Infecciones virales.  Embarazo.  Un ataque del sistema de defensa (sistema inmunitario) a la tiroides.  Algunos medicamentos.  Defectos de nacimiento.  Radioterapias anteriores en la cabeza o el cuello.  Tratamiento previo con yodo radioactivo.  Extirpacin quirrgica previa de una parte o de toda la tiroides.  Problemas con la glndula ubicada en el centro del cerebro (hipfisis). SIGNOS Y SNTOMAS Los signos y los sntomas de hipotiroidismo pueden ser los siguientes:  Sensacin de falta de energa (letargo).  Incapacidad para tolerar el fro.  Aumento de peso que no puede explicarse por un cambio en la dieta o en los hbitos de ejercicio fsico.  Piel seca.  Pelo grueso.  Irregularidades menstruales.  Ralentizacin de los procesos de pensamiento.  Estreimiento.  Tristeza o depresin. DIAGNSTICO  El mdico puede diagnosticar el hipotiroidismo con anlisis de sangre y ecografas. TRATAMIENTO El hipotiroidismo se trata con medicamentos que reemplazan las hormonas que el cuerpo no produce. Despus de comenzar el tratamiento, pueden pasar varias semanas hasta la desaparicin de los sntomas. INSTRUCCIONES PARA EL CUIDADO EN EL HOGAR   Tome los medicamentos solamente como se lo haya indicado el mdico.  Si empieza a tomar medicamentos nuevos, infrmele al mdico.  Concurra a todas las visitas de control como se lo haya indicado el mdico. Esto es importante. A medida que la enfermedad mejora, es posible que haya que modificar las dosis. Tendr que hacerse anlisis de sangre  peridicamente, de modo que el mdico pueda controlar la enfermedad. SOLICITE ATENCIN MDICA SI:  Los sntomas no mejoran con el tratamiento.  Est tomando medicamentos sustitutivos de la tiroides y:  Suda en exceso.  Siente temblores.  Est ansioso.  Baja de peso rpidamente.  No puede tolerar el calor.  Tiene cambios emocionales.  Tiene diarrea.  Se siente dbil. SOLICITE ATENCIN MDICA DE INMEDIATO SI:   Siente dolor en el pecho.  Tiene latidos cardacos irregulares o siente dolor en el pecho.  Nota que la frecuencia cardaca est acelerada.   Esta informacin no tiene como fin reemplazar el consejo del mdico. Asegrese de hacerle al mdico cualquier pregunta que tenga.   Document Released: 08/30/2005 Document Revised: 09/20/2014 Elsevier Interactive Patient Education 2016 [employer]vier Inc.  

## 2015-09-03 NOTE — Progress Notes (Signed)
Patient ID: Karina Murphy, female   DOB: 09/27/1980, 34 y.o.   MRN: [id number]  CC: Thyroid  HPI: Karina Murphy is a 34 y.o. female here today for a follow up visit.  Patient has past medical history of rheumatoid arthritis, HTN, headaches, and anxiety. Patient reports that she was seen at her GI doctors office on 12/9 and had blood work that revealed a elevated TSH level. She was told to come to her PCP for further evaluation. She does endorse constipation, loss of hair, weight loss, and fatigue. She denies any family history of thyroid disease.  Reports that she has been having a burning in her tongue for the past 3 days. She denies any medications, foods, or injury to tongue. She states that she also has a sore throat when she swallows. She denies cough, rhinitis, fever, chills. She reports that she has vomited several times and after each time she has pain in her throat.  No Known Allergies Past Medical History  Diagnosis Date  . Hypertension   . Arthritis, rheumatoid (HCC)   . Anxiety   . Chronic headaches    Current Outpatient Prescriptions on File Prior to Visit  Medication Sig Dispense Refill  . cyclobenzaprine (FLEXERIL) 5 MG tablet Take 1 tablet (5 mg total) by mouth 3 (three) times daily as needed for muscle spasms. Back pain and/or headaches 30 tablet 1  . sulfaSALAzine (AZULFIDINE) 500 MG EC tablet Take 500 mg by mouth 2 (two) times daily.    . SUMAtriptan (IMITREX) 100 MG tablet Take 1 tab at earliest onset of headache.  May repeat once in 2 hours if headache persists or recurs. 10 tablet 2  . Venlafaxine HCl 37.5 MG TB24 Take 1tab daily with food for 7 days, then increase to 2 tabs daily 60 each 0  . diclofenac (VOLTAREN) 75 MG EC tablet Take 1 tablet (75 mg total) by mouth 2 (two) times daily. (Patient taking differently: Take 150 mg by mouth daily at 12 noon. ) 60 tablet 1  . metoCLOPramide (REGLAN) 10 MG tablet Take 1 tablet (10 mg total) by mouth every 8  (eight) hours as needed for nausea or vomiting. 30 tablet 1   No current facility-administered medications on file prior to visit.   Family History  Problem Relation Age of Onset  . Diabetes Mother   . Hypertension Mother    Social History   Social History  . Marital Status: Single    Spouse Name: N/A  . Number of Children: 2  . Years of Education: N/A   Occupational History  . Not on file.   Social History Main Topics  . Smoking status: Never Smoker   . Smokeless tobacco: Never Used  . Alcohol Use: No  . Drug Use: No  . Sexual Activity: Yes    Birth Control/ Protection: Condom   Other Topics Concern  . Not on file   Social History Narrative   Completed 2nd grade.     Review of Systems: Other than what is stated in HPI, all other systems are negative.   Objective:   Filed Vitals:   09/03/15 1442  BP: 119/81  Pulse: 91  Temp: 98 F (36.7 C)  Resp: 16    Physical Exam  HENT:  Mouth/Throat: Oropharynx is clear and moist.  Neck: No thyromegaly present.  Cardiovascular: Normal rate, regular rhythm and normal heart sounds.   Pulmonary/Chest: Effort normal and breath sounds normal.  Abdominal: There is no tenderness.  Lab Results  Component Value Date   WBC 7.3 08/22/2015   HGB 12.0 08/22/2015   HCT 36.5 08/22/2015   MCV 85.4 08/22/2015   PLT 350.0 08/22/2015   Lab Results  Component Value Date   CREATININE 0.90 08/22/2015   BUN 16 08/22/2015   NA 139 08/22/2015   K 4.1 08/22/2015   CL 101 08/22/2015   CO2 30 08/22/2015    Lab Results  Component Value Date   HGBA1C 5.50 06/12/2015   Lipid Panel  No results found for: CHOL, TRIG, HDL, CHOLHDL, VLDL, LDLCALC     Assessment and plan:   Karina Murphy was seen today for follow-up.  Diagnoses and all orders for this visit:  Abnormal TSH -     TSH; Future -     T4, Free; Future Patient was sick when last TSH was drawn. I will have her come back in 2-3 months and recheck levels  Throat  irritation Related to cyclic vomiting. Acid irritating her throat and tongue  Anxiety I have explained that anxiety and depression may be a cause of nausea and vomiting. She has been placed on Effexor by her neurologist for headaches but she has yet to pick up due to price. I have contacted [facility] pharmacy to have prescription sent here so that she is able to get medication at affordable price.  Return in about 3 months (around 12/02/2015) for Lab Visit-TSH level .   Ambrose Finland, NP-C [facility] [phone] 09/03/2015, 2:55 PM

## 2015-09-18 ENCOUNTER — Telehealth: Payer: Self-pay

## 2015-09-18 NOTE — Telephone Encounter (Signed)
Called patient via interpreter line Verlon Au ID# [phone] Patient is aware she can cut the effexor in half and take for the  Next week or two Patient understands to call us in a week to update [place] on how she is feeling

## 2015-09-26 ENCOUNTER — Ambulatory Visit: Payer: Self-pay | Attending: Internal Medicine

## 2015-10-06 ENCOUNTER — Encounter: Payer: Self-pay | Admitting: Gastroenterology

## 2015-10-06 ENCOUNTER — Ambulatory Visit (AMBULATORY_SURGERY_CENTER): Payer: Self-pay | Admitting: Gastroenterology

## 2015-10-06 VITALS — BP 119/73 | HR 58 | Temp 98.3°F | Resp 18 | Ht 62.0 in | Wt 198.0 lb

## 2015-10-06 DIAGNOSIS — R112 Nausea with vomiting, unspecified: Secondary | ICD-10-CM

## 2015-10-06 MED ORDER — SODIUM CHLORIDE 0.9 % IV SOLN: 500.0000 mL | INTRAVENOUS | Status: DC

## 2015-10-06 NOTE — Op Note (Signed)
[facility] [address] [place], [address]   ENDOSCOPY PROCEDURE REPORT  PATIENT: Karina, Murphy  MR#: [id number] BIRTHDATE: [birth year] , 34  yrs. old GENDER: female ENDOSCOPIST: Marsa Aris, MD REFERRED BY:  Holland Commons NP PROCEDURE DATE:  10/06/2015 PROCEDURE:  EGD w/ biopsy for H.pylori ASA CLASS:     Class II INDICATIONS:  epigastric pain, dyspepsia, nausea, and vomiting. MEDICATIONS: Propofol 200 mg IV and Lidocaine 40 mg IV TOPICAL ANESTHETIC: none  DESCRIPTION OF PROCEDURE: After the risks benefits and alternatives of the procedure were thoroughly explained, informed consent was obtained.  The LB [id number] endoscope was introduced through the mouth and advanced to the second portion of the duodenum , Without limitations.  The instrument was slowly withdrawn as the mucosa was fully examined.      ESOPHAGUS: The mucosa of the esophagus appeared normal.  STOMACH: There was mild antral gastropathy noted.  Cold forcep biopsies were taken at the antrum and angularis.  DUODENUM: The duodenal mucosa showed no abnormalities in the bulb and 2nd part of the duodenum.  Retroflexed views revealed no abnormalities.     The scope was then withdrawn from the patient and the procedure completed.  COMPLICATIONS: There were no immediate complications.  ENDOSCOPIC IMPRESSION: 1.   The mucosa of the esophagus appeared normal 2.   There was mild antral gastropathy noted [T2] 3.   The duodenal mucosa showed no abnormalities in the bulb and 2nd part of the duodenum  RECOMMENDATIONS: 1.  Await biopsy results 2.  limit use of NSAIDs    eSigned:  Marsa Aris, MD 10/06/2015 10:23 AM

## 2015-10-06 NOTE — Progress Notes (Signed)
Called to room to assist during endoscopic procedure.  Patient ID and intended procedure confirmed with present staff. Received instructions for my participation in the procedure from the performing physician.  

## 2015-10-06 NOTE — Patient Instructions (Signed)
YOU HAD AN ENDOSCOPIC PROCEDURE TODAY AT THE [facility]:   Refer to the procedure report that was given to you for any specific questions about what was found during the examination.  If the procedure report does not answer your questions, please call your gastroenterologist to clarify.  If you requested that your care partner not be given the details of your procedure findings, then the procedure report has been included in a sealed envelope for you to review at your convenience later.  YOU SHOULD EXPECT: Some feelings of bloating in the abdomen. Passage of more gas than usual.  Walking can help get rid of the air that was put into your GI tract during the procedure and reduce the bloating. If you had a lower endoscopy (such as a colonoscopy or flexible sigmoidoscopy) you may notice spotting of blood in your stool or on the toilet paper. If you underwent a bowel prep for your procedure, you may not have a normal bowel movement for a few days.  Please Note:  You might notice some irritation and congestion in your nose or some drainage.  This is from the oxygen used during your procedure.  There is no need for concern and it should clear up in a day or so.  SYMPTOMS TO REPORT IMMEDIATELY:    Following upper endoscopy (EGD)  Vomiting of blood or coffee ground material  New chest pain or pain under the shoulder blades  Painful or persistently difficult swallowing  New shortness of breath  Fever of 100F or higher  Black, tarry-looking stools  For urgent or emergent issues, a gastroenterologist can be reached at any hour by calling [phone].   DIET: Your first meal following the procedure should be a small meal and then it is ok to progress to your normal diet. Heavy or fried foods are harder to digest and may make you feel nauseous or bloated.  Likewise, meals heavy in dairy and vegetables can increase bloating.  Drink plenty of fluids but you should avoid alcoholic beverages  for 24 hours.  ACTIVITY:  You should plan to take it easy for the rest of today and you should NOT DRIVE or use heavy machinery until tomorrow (because of the sedation medicines used during the test).    FOLLOW UP: Our staff will call the number listed on your records the next business day following your procedure to check on you and address any questions or concerns that you may have regarding the information given to you following your procedure. If we do not reach you, we will leave a message.  However, if you are feeling well and you are not experiencing any problems, there is no need to return our call.  We will assume that you have returned to your regular daily activities without incident.  If any biopsies were taken you will be contacted by phone or by letter within the next 1-3 weeks.  Please call us at [phone] if you have not heard about the biopsies in 3 weeks.    SIGNATURES/CONFIDENTIALITY: You and/or your care partner have signed paperwork which will be entered into your electronic medical record.  These signatures attest to the fact that that the information above on your After Visit Summary has been reviewed and is understood.  Full responsibility of the confidentiality of this discharge information lies with you and/or your care-partner.   Limit Nsaids,but resume remainder of medications.

## 2015-10-06 NOTE — Progress Notes (Signed)
Stable to RR 

## 2015-10-07 ENCOUNTER — Telehealth: Payer: Self-pay | Admitting: *Deleted

## 2015-10-07 NOTE — Telephone Encounter (Signed)
  Follow up Call-  Call back number 10/06/2015  Post procedure Call Back phone  # [phone]  Permission to leave phone message Yes     Patient questions:  Do you have a fever, pain , or abdominal swelling? No. Pain Score  0 *  Have you tolerated food without any problems? Yes.    Have you been able to return to your normal activities? Yes.    Do you have any questions about your discharge instructions: Diet   No. Medications  No. Follow up visit  No.  Do you have questions or concerns about your Care? No.  Actions: * If pain score is 4 or above: No action needed, pain <4.

## 2015-10-15 ENCOUNTER — Encounter: Payer: Self-pay | Admitting: Gastroenterology

## 2015-10-31 ENCOUNTER — Other Ambulatory Visit: Payer: Self-pay

## 2015-10-31 ENCOUNTER — Telehealth: Payer: Self-pay | Admitting: Gastroenterology

## 2015-10-31 DIAGNOSIS — R112 Nausea with vomiting, unspecified: Secondary | ICD-10-CM

## 2015-10-31 MED ORDER — ONDANSETRON HCL 4 MG PO TABS: 4.0000 mg | ORAL_TABLET | Freq: Three times a day (TID) | ORAL | Status: DC | PRN

## 2015-10-31 NOTE — Telephone Encounter (Signed)
Pt aware, script sent to pharmacy. Pt scheduled for GES at [facility] 11/18/15@7 :30am, pt to arrive there at 7:15am. No app appts out after GES 11/18/15. Will need to call pt back with appt.

## 2015-10-31 NOTE — Telephone Encounter (Signed)
Please send Rx fro Zofran 4mg  q8H as needed. Schedule her for gastric emptying scan. Follow up in office visit, ok to schedule with extender

## 2015-10-31 NOTE — Telephone Encounter (Signed)
Reviewed path results with pt. Pt states she needs something for nausea, states she vomits everyday. Pt reports she has already vomited twice today. Please advise.

## 2015-11-05 NOTE — Telephone Encounter (Signed)
I have scheduled her for the first available appointment. Will call her with the results from the upcoming study. I will move her appointment to something sooner if it comes available. She is presently scheduled for April 26 th at 4:00 pm. Her profile lists her language as Spanish.

## 2015-11-05 NOTE — Telephone Encounter (Signed)
ok 

## 2015-11-05 NOTE — Telephone Encounter (Signed)
There are no appointments with any provider. Can I put her on for follow up in April?

## 2015-11-13 ENCOUNTER — Encounter (HOSPITAL_COMMUNITY): Payer: Self-pay | Admitting: Emergency Medicine

## 2015-11-13 ENCOUNTER — Emergency Department (INDEPENDENT_AMBULATORY_CARE_PROVIDER_SITE_OTHER)
Admission: EM | Admit: 2015-11-13 | Discharge: 2015-11-13 | Disposition: A | Discharge status: Home / Self Care | Payer: Self-pay | Source: Home / Self Care | Attending: Family Medicine | Admitting: Family Medicine

## 2015-11-13 DIAGNOSIS — B349 Viral infection, unspecified: Secondary | ICD-10-CM

## 2015-11-13 DIAGNOSIS — J069 Acute upper respiratory infection, unspecified: Secondary | ICD-10-CM

## 2015-11-13 DIAGNOSIS — J9801 Acute bronchospasm: Secondary | ICD-10-CM

## 2015-11-13 MED ORDER — ALBUTEROL SULFATE (2.5 MG/3ML) 0.083% IN NEBU
2.5000 mg | INHALATION_SOLUTION | Freq: Once | RESPIRATORY_TRACT | Status: AC
  Administered 2015-11-13: 2.5 mg via RESPIRATORY_TRACT

## 2015-11-13 MED ORDER — PREDNISONE 20 MG PO TABS: ORAL_TABLET | ORAL | Status: DC

## 2015-11-13 MED ORDER — METHYLPREDNISOLONE ACETATE 80 MG/ML IJ SUSP
80.0000 mg | Freq: Once | INTRAMUSCULAR | Status: AC
  Administered 2015-11-13: 80 mg via INTRAMUSCULAR

## 2015-11-13 MED ORDER — IPRATROPIUM-ALBUTEROL 0.5-2.5 (3) MG/3ML IN SOLN
3.0000 mL | Freq: Once | RESPIRATORY_TRACT | Status: AC
  Administered 2015-11-13: 3 mL via RESPIRATORY_TRACT

## 2015-11-13 MED ORDER — METHYLPREDNISOLONE ACETATE 80 MG/ML IJ SUSP
INTRAMUSCULAR | Status: AC
  Filled 2015-11-13: qty 1

## 2015-11-13 MED ORDER — ALBUTEROL SULFATE (2.5 MG/3ML) 0.083% IN NEBU
INHALATION_SOLUTION | RESPIRATORY_TRACT | Status: AC
  Filled 2015-11-13: qty 3

## 2015-11-13 MED ORDER — IPRATROPIUM-ALBUTEROL 0.5-2.5 (3) MG/3ML IN SOLN
RESPIRATORY_TRACT | Status: AC
  Filled 2015-11-13: qty 3

## 2015-11-13 MED ORDER — ALBUTEROL SULFATE HFA 108 (90 BASE) MCG/ACT IN AERS: 2.0000 | INHALATION_SPRAY | RESPIRATORY_TRACT | Status: DC | PRN

## 2015-11-13 NOTE — ED Provider Notes (Signed)
CSN: [id number]     Arrival date & time 11/13/15  1732 History   First MD Initiated Contact with Patient 11/13/15 1857     Chief Complaint  Patient presents with  . Cough  . Sore Throat  . Generalized Body Aches   (Consider location/radiation/quality/duration/timing/severity/associated sxs/prior Treatment) HPI Comments: 35 year old female accompanied by her daughter who does most of the interpreting for her, presents with a four-day history of cough, coughing spasms, sore throat, body aches and difficulty swallowing. She is able to handle her secretions and drink small amounts of water. Patient is a 35 y.o. female presenting with URI.  URI Presenting symptoms: congestion, cough, ear pain, fatigue, fever, rhinorrhea and sore throat   Severity:  Moderate Onset quality:  Gradual Timing:  Constant Progression:  Worsening Chronicity:  New Relieved by:  Nothing Ineffective treatments:  OTC medications Associated symptoms: sneezing and wheezing   Associated symptoms: no neck pain     Past Medical History  Diagnosis Date  . Hypertension   . Arthritis, rheumatoid (HCC)   . Anxiety   . Chronic headaches    Past Surgical History  Procedure Laterality Date  . No past surgeries     Family History  Problem Relation Age of Onset  . Diabetes Mother   . Hypertension Mother   . Stomach cancer Neg Hx   . Colon cancer Neg Hx    Social History  Substance Use Topics  . Smoking status: Never Smoker   . Smokeless tobacco: Never Used  . Alcohol Use: No   OB History    No data available     Review of Systems  Constitutional: Positive for fever, activity change, appetite change and fatigue. Negative for chills.  HENT: Positive for congestion, ear pain, postnasal drip, rhinorrhea, sneezing, sore throat, trouble swallowing and voice change. Negative for facial swelling.   Eyes: Negative.   Respiratory: Positive for cough, shortness of breath and wheezing.   Cardiovascular: Negative.    Gastrointestinal: Negative.   Genitourinary: Negative.   Musculoskeletal: Negative for neck pain and neck stiffness.  Skin: Negative for pallor and rash.  Neurological: Positive for light-headedness. Negative for tremors, seizures, syncope and speech difficulty.  Psychiatric/Behavioral: Negative.   All other systems reviewed and are negative.   Allergies  Eggs or egg-derived products  Home Medications   Prior to Admission medications   Medication Sig Start Date End Date Taking? Authorizing Provider  venlafaxine XR (EFFEXOR-XR) 37.5 MG 24 hr capsule  09/04/15  Yes Historical Provider, MD  albuterol (PROVENTIL HFA;VENTOLIN HFA) 108 (90 Base) MCG/ACT inhaler Inhale 2 puffs into the lungs every 4 (four) hours as needed for wheezing or shortness of breath. 11/13/15   Hayden Rasmussen, NP  cyclobenzaprine (FLEXERIL) 5 MG tablet Take 1 tablet (5 mg total) by mouth 3 (three) times daily as needed for muscle spasms. Back pain and/or headaches 06/12/15   Ambrose Finland, NP  diclofenac (VOLTAREN) 75 MG EC tablet Take 1 tablet (75 mg total) by mouth 2 (two) times daily. Patient taking differently: Take 150 mg by mouth daily at 12 noon.  04/03/15   Ambrose Finland, NP  ondansetron (ZOFRAN) 4 MG tablet Take 1 tablet (4 mg total) by mouth every 8 (eight) hours as needed for nausea or vomiting. 10/31/15   Napoleon Form, MD  predniSONE (DELTASONE) 20 MG tablet Take 3 tabs po on first day, 2 tabs second day, 2 tabs third day, 1 tab fourth day, 1 tab 5th day. Take with  food. 11/13/15   Hayden Rasmussen, NP  sulfaSALAzine (AZULFIDINE) 500 MG EC tablet Take 500 mg by mouth 2 (two) times daily. Reported on 10/06/2015 02/06/15   Historical Provider, MD   Meds Ordered and Administered this Visit   Medications  ipratropium-albuterol (DUONEB) 0.5-2.5 (3) MG/3ML nebulizer solution 3 mL (3 mLs Nebulization Given 11/13/15 1947)  albuterol (PROVENTIL) (2.5 MG/3ML) 0.083% nebulizer solution 2.5 mg (2.5 mg Nebulization Given 11/13/15  1947)  methylPREDNISolone acetate (DEPO-MEDROL) injection 80 mg (80 mg Intramuscular Given 11/13/15 1956)    BP 124/83 mmHg  Pulse 99  Temp(Src) 99.9 F (37.7 C) (Oral)  Resp 20  SpO2 97% No data found.   Physical Exam  Constitutional: She is oriented to person, place, and time. She appears well-developed and well-nourished. No distress.  HENT:  Head: Normocephalic.  Right Ear: External ear normal.  Left Ear: External ear normal.  Mouth/Throat: No oropharyngeal exudate.  Minor erythema of the posterior oropharynx.  No cobblestoning or exudates. Airway is widely patent. No swelling. Clear PND.  Bilateral TMs are retracted. Minor erythema to the left.  Eyes: Conjunctivae and EOM are normal.  Neck: Normal range of motion. Neck supple.  Cardiovascular: Normal rate and regular rhythm.   No murmur heard. Pulmonary/Chest: Effort normal. No respiratory distress. She has wheezes. She has no rales.  Musculoskeletal: Normal range of motion.  Lymphadenopathy:    She has no cervical adenopathy.  Neurological: She is alert and oriented to person, place, and time. No cranial nerve deficit.  Skin: Skin is warm and dry.  Psychiatric: She has a normal mood and affect.  Nursing note and vitals reviewed.   ED Course  Procedures (including critical care time)  Labs Review Labs Reviewed - No data to display  Imaging Review No results found.   Visual Acuity Review  Right Eye Distance:   Left Eye Distance:   Bilateral Distance:    Right Eye Near:   Left Eye Near:    Bilateral Near:         MDM   1. URI (upper respiratory infection)   2. Viral syndrome   3. Cough due to bronchospasm    Post DuoNeb 5/2.5 patient states she is breathing better. Her cough has decreased. Air movement has improved and wheezes have diminished.  For nasal and head congestion may take Sudafed PE 10 mg every 4 hours as needed. Saline nasal spray used frequently. For drainage may use Allegra,  Claritin or Zyrtec. If you need stronger medicine to stop drainage may take Chlor-Trimeton 2-4 mg every 4 hours. This may cause drowsiness. Ibuprofen 600 mg every 6 hours as needed for pain, discomfort or fever. Drink plenty of fluids and stay well-hydrated. Meds ordered this encounter  Medications  . ipratropium-albuterol (DUONEB) 0.5-2.5 (3) MG/3ML nebulizer solution 3 mL    Sig:   . albuterol (PROVENTIL) (2.5 MG/3ML) 0.083% nebulizer solution 2.5 mg    Sig:   . methylPREDNISolone acetate (DEPO-MEDROL) injection 80 mg    Sig:   . albuterol (PROVENTIL HFA;VENTOLIN HFA) 108 (90 Base) MCG/ACT inhaler    Sig: Inhale 2 puffs into the lungs every 4 (four) hours as needed for wheezing or shortness of breath.    Dispense:  1 Inhaler    Refill:  0    Order Specific Question:  Supervising Provider    Answer:  Bradd Canary D [id number]  . predniSONE (DELTASONE) 20 MG tablet    Sig: Take 3 tabs po on first day, 2 tabs second  day, 2 tabs third day, 1 tab fourth day, 1 tab 5th day. Take with food.    Dispense:  9 tablet    Refill:  0    Order Specific Question:  Supervising Provider    Answer:  Linna Hoff [[id number]     Hayden Rasmussen, NP 11/13/15 2021

## 2015-11-13 NOTE — Discharge Instructions (Signed)
Broncoespasmo - Adultos (Bronchospasm, Adult) Broncoespasmo significa que hay un espasmo o restriccin de las vas areas que llevan el aire a los pulmones. Durante el broncoespasmo, la respiracin se hace ms difcil debido a que las vas respiratorias se contraen. Cuando esto ocurre, puede haber tos, un silbido al respirar (sibilancias) presin en el pecho y dificultad para respirar. Generalmente se asocia al asma, pero no todos los pacientes que experimentan broncoespasmos sufren asma. CAUSAS  La causa del broncoespasmo es la inflamacin o la irritacin de las vas respiratorias. La inflamacin o la irritacin pueden haber sido desencadenadas por:   Set designer (por ejemplo a animales, polen, alimentos y moho). Los alrgenos que causan el broncoespasmo pueden producir sibilancias inmediatamente despus de la exposicin, o varias horas despus.   Infeccin. Se considera que la causa ms frecuente son las infecciones virales.   Ejercicios.   Irritantes (como la polucin, humo de cigarrillos, olores fuertes, Nature conservation officer y vapores de [place]).   Los cambios climticos. El viento aumenta la cantidad de moho y polen del aire. La lluvia limpia el aire arrastrando las sustancias irritantes. El aire fro puede causar inflamacin.   Estrs y [employer].  [place].   Tos excesiva durante la noche.   Tos frecuente o intensa durante un resfro comn.   Opresin en el pecho.   Falta de aire.  DIAGNSTICO  El broncoespasmo se diagnostica con la historia clnica y un examen fsico. En algunos casos se indican otros estudios, como radiografas de trax, para Clinical research associate. TRATAMIENTO   Le indicarn medicamentos por va inhalatoria para abrir las vas respiratorias y ayudarlo a Ambulance person. Los medicamentos se administran por medio de Educational psychologist o Furniture conservator/restorer.  Le administrarn corticoides en los casos de broncoespasmo grave, generalmente  cuando se asocia al asma. INSTRUCCIONES PARA EL CUIDADO EN EL HOGAR   Siempre tenga un plan preparado para pedir asistencia mdica. Sepa cuando debe llamar al mdico y a los servicios de emergencia de su localidad (911 en [place]). Sepa donde puede acceder a un servicio de emergencias.  Tome todos los medicamentos como le indic el mdico.  Si le indicaron el uso de un inhalador o nebulizador, consulte a su mdico para que le explique cmo usarlo correctamente. Siempre use un espaciador con el inhalador, si le indicaron su uso.  Es Manufacturing systems engineer muy tranquilo durante el ataque. Trate de relajarse y respirar ms lentamente.  Controle el ambiente del hogar del siguiente modo:  Cambie el filtro de la calefaccin y del aire acondicionado al menos una vez al mes.   Limite el uso de hogares o estufas a lea.  No fume y nopermita que fumen en su hogar.   Evite la exposicin a perfumes y fragancias.   Elimine las plagas (como cucarachas, ratones) y sus excrementos.   Elimine las plantas si observa moho en ellas.   Mantenga su casa limpia y [place].   Reemplace las alfombras por pisos de [place], baldosas o vinilo. Las alfombras pueden retener las escamas o pelos de los animales y [place].   Use almohadas, mantas y cubre colchones antialrgicos.   [place] sbanas y las mantas todas las semanas con agua caliente y squelas con aire caliente.   Use mantas de [place] o algodn.   Lvese las manos con frecuencia. SOLICITE ATENCIN MDICA SI:   Tiene dolores musculares.   Siente dolor en el pecho.   El esputo cambia de un color claro o blanco  a un color amarillo, verde, gris o sanguinolento.   El esputo que elimina es ms espeso.   Tiene problemas relacionados con el medicamento que recibe. como urticaria, picazn, hinchazn o dificultades respiratorias.  SOLICITE ATENCIN MDICA DE INMEDIATO SI:   Empeoran las sibilancias y la tos, an despus de tomar  los medicamentos recetados.   Tiene una creciente dificultad para respirar.   Siente un dolor intenso en el pecho. ASEGRESE DE QUE:   Comprende estas instrucciones.  Controlar su afeccin.  Recibir ayuda de inmediato si no mejora o si empeora.   Esta informacin no tiene Theme park manager el consejo del mdico. Asegrese de hacerle al mdico cualquier pregunta que tenga.   Document Released: 12/07/2007 Document Revised: 09/20/2014 Elsevier Interactive Patient Education 2016 [employer].  Cmo usar un Armed forces operational officer (How to Use an Inhaler) Es muy importante que sepa usar su Water engineer. Una buena tcnica garantizar que el medicamento llegue a los pulmones.  CMO USAR UN INHALADOR:  Retire la tapa del Armed forces operational officer.  Si esta es la primera vez que [place] el [place], debe prepararlo. Sacuda el inhalador durante 5segundos. Libere cuatro descargas en el aire, lejos del rostro. Si tiene preguntas, pdale ayuda al mdico.  Sacuda el inhalador durante 5segundos.  Gire el inhalador de modo que la botella quede por encima de la boquilla.  Coloque el dedo ndice por encima de la botella. El pulgar debe sujetar la parte inferior del inhalador.  Abra la boca.  Sostenga el inhalador lejos de la boca (un ancho de 2 dedos) o coloque los labios alrededor de la boquilla. Pregntele al mdico de qu manera debe usar Therapist, nutritional.  Exhale la mayor cantidad de aire que pueda.  Inhale y presione la botella hacia abajo 1vez para liberar el medicamento. Sentir cmo el medicamento ingresa a la boca y Administrator.  Siga inspirando profundamente, muy despacio. Trate de llenar los pulmones.  Despus de que haya inspirado profundamente, contenga la respiracin durante 10segundos. Esto ayudar a que el medicamento se asiente en los pulmones. Si no puede contener la respiracin durante 10segundos, contngala cuanto ms pueda antes de exhalar.  Exhale lentamente a travs de los labios  fruncidos. Ponga los labios como cuando silba.  Si el mdico le ha indicado que debe aspirar ms de 1 descarga, espere de 15 a 30segundos como mnimo entre Presenter, broadcasting. Esto ayudar a que [employer] del [place]. No use el inhalador ms veces de las que el [employer].  Vuelva a Financial trader.  Siga las indicaciones del mdico o las instrucciones que vienen en la caja para Building control surveyor. Si [place] ms de Advertising account executive, pregntele al mdico qu inhaladores debe usar y en qu orden. Pdale al mdico que lo ayude a determinar cundo Higher education careers adviser a Field seismologist.  Si [place] un inhalador con corticoides, enjuguese siempre la boca con agua despus de la ltima descarga, hgase grgaras y escupa el agua. No trague el agua. SOLICITE AYUDA SI:  El medicamento del Armed forces operational officer solo lo ayuda parcialmente a TEFL teacher las sibilancias y las dificultades para Industrial/product designer.  Tiene dificultad para Academic librarian.  Experimenta un leve aumento de la expectoracin espesa (flema). SOLICITE AYUDA DE INMEDIATO SI:  El medicamento del inhalador no ayuda a TEFL teacher las sibilancias o la dificultad para respirar, o bien, si siente opresin en el pecho.  Siente mareos, dolor de cabeza o una frecuencia cardaca acelerada.  Siente escalofros, fiebre o sudores  nocturnos.  Experimenta un aumento considerable de la expectoracin espesa o si observa sangre en dicha expectoracin espesa. ASEGRESE DE QUE:   Comprende estas instrucciones.  Controlar su afeccin.  Recibir ayuda de inmediato si no mejora o si empeora.   Esta informacin no tiene Theme park manager el consejo del mdico. Asegrese de hacerle al mdico cualquier pregunta que tenga.   Document Released: 10/02/2010 Document Revised: 06/20/2013 Elsevier Interactive Patient Education 2016 [employer].  Infeccin del tracto respiratorio superior, adultos (Upper Respiratory Infection, Adult) For  nasal and head congestion may take Sudafed PE 10 mg every 4 hours as needed. Saline nasal spray used frequently. For drainage may use Allegra, Claritin or Zyrtec. If you need stronger medicine to stop drainage may take Chlor-Trimeton 2-4 mg every 4 hours. This may cause drowsiness. Ibuprofen 600 mg every 6 hours as needed for pain, discomfort or fever. Drink plenty of fluids and stay well-hydrated.  La mayora de las infecciones del tracto respiratorio superior son infecciones virales de las vas que llevan el aire a los pulmones. Un infeccin del tracto respiratorio superior afecta la nariz, la garganta y las vas respiratorias superiores. El tipo ms frecuente de infeccin del tracto respiratorio superior es la nasofaringitis, que habitualmente se conoce como "resfro comn". Las infecciones del tracto respiratorio superior siguen su curso y por lo general se curan solas. En la [employer], la infeccin del tracto respiratorio superior no requiere atencin [place], Biomedical engineer a veces, despus de una infeccin viral, puede surgir una infeccin bacteriana en las vas respiratorias superiores. Esto se conoce como infeccin secundaria. Las infecciones sinusales y en el odo medio son tipos frecuentes de infecciones secundarias en el tracto respiratorio superior. La neumona bacteriana tambin puede complicar un cuadro de infeccin del tracto respiratorio superior. Este tipo de infeccin puede empeorar el asma y la enfermedad pulmonar obstructiva crnica (EPOC). En algunos casos, estas complicaciones pueden requerir atencin mdica de emergencia y poner en peligro la vida.  CAUSAS Casi todas las infecciones del tracto respiratorio superior se deben a los virus. Un virus es un tipo de microbio que puede contagiarse de Neomia Dear persona a Educational psychologist.  FACTORES DE RIESGO Puede estar en riesgo de sufrir una infeccin del tracto respiratorio superior si:   Fuma.  Tiene una enfermedad pulmonar o cardaca  crnica.  Tiene debilitado el sistema de defensa (inmunitario) del cuerpo.  Es [address] o de edad muy [place].  Tiene asma o alergias nasales.  Trabaja en reas donde hay mucha gente o poca ventilacin.  Rudi Coco en una escuela o en un centro de atencin mdica. SIGNOS Y SNTOMAS  Habitualmente, los sntomas aparecen de 2a 3das despus de entrar en contacto con el virus del resfro. La mayora de las infecciones virales en el tracto respiratorio superior duran de 7a 10das. Sin embargo, las infecciones virales en el tracto respiratorio superior a causa del virus de la gripe pueden durar de 14a 18das y, habitualmente, son ms graves. Entre los sntomas se pueden incluir los siguientes:   Secrecin o congestin nasal.  Estornudos.  Tos.  Dolor de Advertising copywriter.  Dolor de [place].  [place].  Grant Ruts.  Prdida del apetito.  Dolor en la frente, detrs de los ojos y por encima de los pmulos (dolor sinusal).  Dolores musculares. DIAGNSTICO  El mdico puede diagnosticar una infeccin del tracto respiratorio superior mediante los siguientes estudios:  Examen fsico.  Pruebas para verificar si los sntomas no se deben a otra afeccin, por ejemplo:  Faringitis estreptoccica.  Sinusitis.  Neumona.  Asma. TRATAMIENTO  Esta infeccin desaparece sola, con el tiempo. No puede curarse con medicamentos, pero a menudo se prescriben para aliviar los sntomas. Los medicamentos pueden ser tiles para lo siguiente:  Personal assistant fiebre.  Reducir la tos.  Aliviar la congestin nasal. INSTRUCCIONES PARA EL CUIDADO EN EL HOGAR   Tome los medicamentos solamente como se lo haya indicado el mdico.  A fin de Engineer, materials de garganta, haga grgaras con solucin salina templada o consuma caramelos para la tos, como se lo haya indicado el mdico.  Use un humidificador de vapor clido o inhale el vapor de la ducha para aumentar la humedad del aire. Esto facilitar la  respiracin.  Beba suficiente lquido para Photographer orina clara o de color amarillo plido.  Consuma sopas y otros caldos transparentes, y [employer].  Descanse todo lo que sea necesario.  Regrese al Aleen Campi cuando la temperatura se le haya normalizado o cuando el mdico lo autorice. Es posible que deba quedarse en su casa durante un tiempo prolongado, para no infectar a los dems. Tambin puede usar un barbijo y lavarse las manos con cuidado para Transport planner propagacin del virus.  Aumente el uso del inhalador si tiene asma.  No consuma ningn producto que contenga tabaco, lo que incluye cigarrillos, tabaco de Theatre manager o Administrator, Civil Service. Si necesita ayuda para dejar de fumar, consulte al [employer]. PREVENCIN  La mejor manera de protegerse de un resfro es mantener una higiene [place].   Evite el contacto oral o fsico con personas que tengan sntomas de resfro.  En caso de contacto, lvese las manos con frecuencia. No hay pruebas claras de que la vitaminaC, la vitaminaE, la equincea o el ejercicio reduzcan la probabilidad de Primary school teacher un resfro. Sin embargo, siempre se recomienda Insurance account manager, hacer ejercicio y Engineering geologist.  SOLICITE ATENCIN MDICA SI:   Su estado empeora en lugar de mejorar.  Los medicamentos no Estate agent.  Tiene escalofros.  La sensacin de falta de aire empeora.  Tiene mucosidad marrn o roja.  Tiene secrecin nasal amarilla o marrn.  Le duele la cara, especialmente al inclinarse hacia adelante.  Tiene fiebre.  Tiene los ganglios del cuello hinchados.  Siente dolor al tragar.  Tiene zonas blancas en la parte de atrs de la garganta. SOLICITE ATENCIN MDICA DE INMEDIATO SI:   Tiene sntomas intensos o persistentes de:  Dolor de [place].  Dolor de odos.  Dolor sinusal.  Dolor en el pecho.  Tiene enfermedad pulmonar crnica y cualquiera de estos sntomas:  Sibilancias.  Tos prolongada.  Tos  con sangre.  Cambio en la mucosidad habitual.  Presenta rigidez en el cuello.  Tiene cambios en:  La visin.  La audicin.  El pensamiento.  El [place] de nimo. ASEGRESE DE QUE:   Comprende estas instrucciones.  Controlar su afeccin.  Recibir ayuda de inmediato si no mejora o si empeora.   Esta informacin no tiene Theme park manager el consejo del mdico. Asegrese de hacerle al mdico cualquier pregunta que tenga.   Document Released: 06/09/2005 Document Revised: 01/14/2015 Elsevier Interactive Patient Education [employer]

## 2015-11-13 NOTE — ED Notes (Signed)
The patient presented to the [facility] with a complaint of a sore throat, cough, and general body aches x 4 days.

## 2015-11-14 ENCOUNTER — Ambulatory Visit: Payer: Self-pay | Attending: Internal Medicine

## 2015-11-14 DIAGNOSIS — R7989 Other specified abnormal findings of blood chemistry: Secondary | ICD-10-CM

## 2015-11-14 DIAGNOSIS — R112 Nausea with vomiting, unspecified: Secondary | ICD-10-CM

## 2015-11-14 DIAGNOSIS — R1011 Right upper quadrant pain: Secondary | ICD-10-CM

## 2015-11-14 MED FILL — predniSONE 20 MG TABS: 20 | 5 days supply | Qty: 9 | Fill #0

## 2015-11-14 MED FILL — VENTOLIN HFA 90 MCG INHALER: 108 (90 BAS | 16 days supply | Qty: 18 | Fill #0

## 2015-11-14 NOTE — Progress Notes (Unsigned)
Patient here for blood work [facility] released on accident Order put back into system

## 2015-11-15 LAB — T4, FREE: FREE T4: 1.1 ng/dL (ref 0.8–1.8)

## 2015-11-15 LAB — TSH: TSH: 1.25 m[IU]/L

## 2015-11-17 ENCOUNTER — Telehealth: Payer: Self-pay | Admitting: Internal Medicine

## 2015-11-17 ENCOUNTER — Telehealth: Payer: Self-pay

## 2015-11-17 NOTE — Telephone Encounter (Signed)
-----   Message from Ambrose Finland, NP sent at 11/17/2015 12:36 PM EST ----- Thyroid is normal

## 2015-11-17 NOTE — Telephone Encounter (Signed)
Interpreter line used [place] ID# [id number] Patient not available Message left on voice mail to return our call

## 2015-11-17 NOTE — Telephone Encounter (Signed)
-----   Message from Valerie A Keck, NP sent at 11/17/2015 12:36 PM EST ----- Thyroid is normal 

## 2015-11-17 NOTE — Telephone Encounter (Signed)
Pt. Returned call. Please f/u with pt. °

## 2015-11-18 ENCOUNTER — Telehealth: Payer: Self-pay

## 2015-11-18 ENCOUNTER — Ambulatory Visit (HOSPITAL_COMMUNITY)
Admission: RE | Admit: 2015-11-18 | Discharge: 2015-11-18 | Disposition: A | Discharge status: Home / Self Care | Payer: Self-pay | Source: Ambulatory Visit | Attending: Gastroenterology | Admitting: Gastroenterology

## 2015-11-18 ENCOUNTER — Telehealth: Payer: Self-pay | Admitting: Internal Medicine

## 2015-11-18 DIAGNOSIS — R112 Nausea with vomiting, unspecified: Secondary | ICD-10-CM | POA: Insufficient documentation

## 2015-11-18 MED ORDER — TECHNETIUM TC 99M SULFUR COLLOID
2.0000 | Freq: Once | INTRAVENOUS | Status: AC | PRN
  Administered 2015-11-18: 2 via INTRAVENOUS

## 2015-11-18 NOTE — Telephone Encounter (Signed)
Patients given doctors note about thyroid.

## 2015-11-18 NOTE — Telephone Encounter (Signed)
Interpreter line used [place] ID# [id number] Returned patient phone call Patient not available Message left on voice mail to return our call

## 2015-11-21 ENCOUNTER — Encounter: Payer: Self-pay | Admitting: Internal Medicine

## 2015-11-21 ENCOUNTER — Ambulatory Visit: Payer: Self-pay | Attending: Internal Medicine | Admitting: Internal Medicine

## 2015-11-21 VITALS — BP 137/82 | HR 79 | Temp 98.0°F | Resp 16 | Ht 63.0 in | Wt 185.0 lb

## 2015-11-21 DIAGNOSIS — J069 Acute upper respiratory infection, unspecified: Secondary | ICD-10-CM

## 2015-11-21 DIAGNOSIS — L309 Dermatitis, unspecified: Secondary | ICD-10-CM

## 2015-11-21 MED ORDER — TRIAMCINOLONE ACETONIDE 0.5 % EX OINT: 1.0000 "application " | TOPICAL_OINTMENT | Freq: Two times a day (BID) | CUTANEOUS | Status: DC

## 2015-11-21 MED ORDER — AMOXICILLIN-POT CLAVULANATE 875-125 MG PO TABS
1.0000 | ORAL_TABLET | Freq: Two times a day (BID) | ORAL | Status: DC
Start: 2015-11-21 — End: 2016-01-07

## 2015-11-21 MED FILL — AMOX-CLAV 875-125 MG TABLET: 875-125 | 7 days supply | Qty: 14 | Fill #0

## 2015-11-21 MED FILL — TRIAMCINOLONE 0.5% OINTMENT: 0.5 | 15 days supply | Qty: 75 | Fill #0

## 2015-11-21 NOTE — Progress Notes (Signed)
   Subjective:    Patient ID: Karina Murphy, female    DOB: 27-Aug-1981, 35 y.o.   MRN: [id number]  Cough This is a new problem. Episode onset: 11 days. Associated symptoms include a fever, rhinorrhea, a sore throat, shortness of breath and wheezing. Pertinent negatives include no chest pain. Nothing aggravates the symptoms. She has tried oral steroids and OTC cough suppressant for the symptoms. The treatment provided no relief. Rheumatoid arthritis     Review of Systems  Constitutional: Positive for fever.  HENT: Positive for congestion, rhinorrhea, sinus pressure, sore throat and tinnitus.   Respiratory: Positive for cough, shortness of breath and wheezing.   Cardiovascular: Negative for chest pain.       Objective:   Physical Exam  HENT:  Right Ear: External ear normal.  Left Ear: External ear normal.  Mouth/Throat: Oropharynx is clear and moist.  Eyes: Conjunctivae are normal. Pupils are equal, round, and reactive to light. Right eye exhibits no discharge. Left eye exhibits no discharge.  Neck: Normal range of motion.  Cardiovascular: Normal rate, regular rhythm and normal heart sounds.   Pulmonary/Chest: Effort normal and breath sounds normal. She has no wheezes. She has no rales.  Lymphadenopathy:    She has cervical adenopathy.  Skin: Skin is warm and dry. Rash (bilateral shins) noted.      Assessment & Plan:  [name] was seen today for cough.  Diagnoses and all orders for this visit:  URI (upper respiratory infection) -     Begin amoxicillin-clavulanate (AUGMENTIN) 875-125 MG tablet; Take 1 tablet by mouth 2 (two) times daily.  Dermatitis -     triamcinolone ointment (KENALOG) 0.5 %; Apply 1 application topically 2 (two) times daily.  Due to language barrier, an interpreter was present during the history-taking and subsequent discussion (and for part of the physical exam) with this patient.  Return if symptoms worsen or fail to improve.  Ambrose Finland,  NP 11/21/2015 5:25 PM

## 2015-11-21 NOTE — Progress Notes (Signed)
Interpreter line used [place] ID# [id number] Patient complain of cough fever and congestion for the  Past two weeks Also stated it sounds like she has a "cat" in her chest

## 2015-11-28 ENCOUNTER — Telehealth: Payer: Self-pay | Admitting: Internal Medicine

## 2015-11-28 DIAGNOSIS — M069 Rheumatoid arthritis, unspecified: Secondary | ICD-10-CM

## 2015-11-28 NOTE — Telephone Encounter (Signed)
Patient came in requesting a medication refill for diclofenac. Please follow up.

## 2015-12-01 MED ORDER — DICLOFENAC SODIUM 75 MG PO TBEC: 150.0000 mg | DELAYED_RELEASE_TABLET | Freq: Every day | ORAL | Status: DC

## 2015-12-17 ENCOUNTER — Encounter (HOSPITAL_COMMUNITY): Payer: Self-pay | Admitting: Emergency Medicine

## 2015-12-17 DIAGNOSIS — Z8739 Personal history of other diseases of the musculoskeletal system and connective tissue: Secondary | ICD-10-CM | POA: Insufficient documentation

## 2015-12-17 DIAGNOSIS — Z8659 Personal history of other mental and behavioral disorders: Secondary | ICD-10-CM | POA: Insufficient documentation

## 2015-12-17 DIAGNOSIS — Z79899 Other long term (current) drug therapy: Secondary | ICD-10-CM | POA: Insufficient documentation

## 2015-12-17 DIAGNOSIS — R112 Nausea with vomiting, unspecified: Secondary | ICD-10-CM | POA: Insufficient documentation

## 2015-12-17 DIAGNOSIS — G8929 Other chronic pain: Secondary | ICD-10-CM | POA: Insufficient documentation

## 2015-12-17 DIAGNOSIS — R509 Fever, unspecified: Secondary | ICD-10-CM | POA: Insufficient documentation

## 2015-12-17 DIAGNOSIS — I10 Essential (primary) hypertension: Secondary | ICD-10-CM | POA: Insufficient documentation

## 2015-12-17 DIAGNOSIS — R109 Unspecified abdominal pain: Secondary | ICD-10-CM | POA: Insufficient documentation

## 2015-12-17 DIAGNOSIS — Z3202 Encounter for pregnancy test, result negative: Secondary | ICD-10-CM | POA: Insufficient documentation

## 2015-12-17 NOTE — ED Notes (Signed)
Pt. reports mid abdominal pain with nausea , vomitting and low back pain onset today .

## 2015-12-18 ENCOUNTER — Emergency Department (HOSPITAL_COMMUNITY)
Admission: EM | Admit: 2015-12-18 | Discharge: 2015-12-18 | Disposition: A | Discharge status: Home / Self Care | Payer: Self-pay | Attending: Emergency Medicine | Admitting: Emergency Medicine

## 2015-12-18 DIAGNOSIS — R112 Nausea with vomiting, unspecified: Secondary | ICD-10-CM

## 2015-12-18 LAB — URINE MICROSCOPIC-ADD ON

## 2015-12-18 LAB — COMPREHENSIVE METABOLIC PANEL
ALK PHOS: 56 U/L (ref 38–126)
ALT: 11 U/L — ABNORMAL LOW (ref 14–54)
ANION GAP: 12 (ref 5–15)
AST: 13 U/L — ABNORMAL LOW (ref 15–41)
Albumin: 3.8 g/dL (ref 3.5–5.0)
BILIRUBIN TOTAL: 0.8 mg/dL (ref 0.3–1.2)
BUN: 9 mg/dL (ref 6–20)
CALCIUM: 9.8 mg/dL (ref 8.9–10.3)
CO2: 23 mmol/L (ref 22–32)
Chloride: 104 mmol/L (ref 101–111)
Creatinine, Ser: 0.86 mg/dL (ref 0.44–1.00)
GLUCOSE: 115 mg/dL — AB (ref 65–99)
POTASSIUM: 3.9 mmol/L (ref 3.5–5.1)
Sodium: 139 mmol/L (ref 135–145)
TOTAL PROTEIN: 8.2 g/dL — AB (ref 6.5–8.1)

## 2015-12-18 LAB — URINALYSIS, ROUTINE W REFLEX MICROSCOPIC
Bilirubin Urine: NEGATIVE
Glucose, UA: NEGATIVE mg/dL
Hgb urine dipstick: NEGATIVE
Ketones, ur: NEGATIVE mg/dL
NITRITE: NEGATIVE
PH: 7.5 (ref 5.0–8.0)
Protein, ur: NEGATIVE mg/dL
SPECIFIC GRAVITY, URINE: 1.016 (ref 1.005–1.030)

## 2015-12-18 LAB — CBC
HEMATOCRIT: 38.2 % (ref 36.0–46.0)
HEMOGLOBIN: 12.2 g/dL (ref 12.0–15.0)
MCH: 27.8 pg (ref 26.0–34.0)
MCHC: 31.9 g/dL (ref 30.0–36.0)
MCV: 87 fL (ref 78.0–100.0)
Platelets: 347 10*3/uL (ref 150–400)
RBC: 4.39 MIL/uL (ref 3.87–5.11)
RDW: 15.3 % (ref 11.5–15.5)
WBC: 9.7 10*3/uL (ref 4.0–10.5)

## 2015-12-18 LAB — POC URINE PREG, ED: PREG TEST UR: NEGATIVE

## 2015-12-18 LAB — LIPASE, BLOOD: Lipase: 23 U/L (ref 11–51)

## 2015-12-18 MED ORDER — ONDANSETRON HCL 4 MG/2ML IJ SOLN
4.0000 mg | Freq: Once | INTRAMUSCULAR | Status: AC
  Administered 2015-12-18: 4 mg via INTRAVENOUS
  Filled 2015-12-18: qty 2

## 2015-12-18 MED ORDER — SODIUM CHLORIDE 0.9 % IV BOLUS (SEPSIS)
1000.0000 mL | Freq: Once | INTRAVENOUS | Status: AC
  Administered 2015-12-18: 1000 mL via INTRAVENOUS

## 2015-12-18 MED ORDER — MORPHINE SULFATE (PF) 4 MG/ML IV SOLN
4.0000 mg | INTRAVENOUS | Status: DC | PRN
  Administered 2015-12-18: 4 mg via INTRAVENOUS
  Filled 2015-12-18: qty 1

## 2015-12-18 MED ORDER — OMEPRAZOLE 20 MG PO CPDR: 20.0000 mg | DELAYED_RELEASE_CAPSULE | Freq: Two times a day (BID) | ORAL | Status: DC

## 2015-12-18 MED ORDER — PANTOPRAZOLE SODIUM 40 MG IV SOLR
40.0000 mg | Freq: Once | INTRAVENOUS | Status: AC
Start: 2015-12-18 — End: 2015-12-18
  Administered 2015-12-18: 40 mg via INTRAVENOUS
  Filled 2015-12-18: qty 40

## 2015-12-18 MED ORDER — ONDANSETRON 4 MG PO TBDP: 4.0000 mg | ORAL_TABLET | Freq: Three times a day (TID) | ORAL | Status: DC | PRN

## 2015-12-18 NOTE — Discharge Instructions (Signed)
Náuseas, Adulto  (Nausea, Adult)   La náusea es la sensación de malestar en el estómago o de la necesidad de vomitar. No constituye una preocupación seria en sí misma, pero puede ser un signo de problemas médicos más graves. Si empeora, puede provocar vómitos. Si aparecen vómitos, hay riesgo de deshidratación.   CAUSES   · Infecciones por virus.  · Intoxicación alimentaria.  · Medicamentos.  · Embarazo.  · Mareos por movimiento.  · Cefaleas migrañosas.  · Estrés emocional.  · Dolor intenso producido en cualquier lugar.  · Intoxicación por alcohol.  INSTRUCCIONES PARA EL CUIDADO EN EL HOGAR   · Debe hacer reposo.  · Pida instrucciones específicas a su médico con respecto a la rehidratación.  · Consuma cantidades pequeñas de alimentos y tome sorbos de líquidos con más frecuencia.  · Tome todos los medicamentos como le indicó el médico.  SOLICITE ATENCIÓN MÉDICA SI:   · No mejora, o empeora, después de 2 días de tratamiento.  · Tiene cefalea.  SOLICITE ATENCIÓN MÉDICA DE INMEDIATO SI:  · Tiene fiebre.  · Se desmaya.  · Sigue vomitando u observa sangre en el vómito.  · Se siente extremadamente débil o deshidratado.  · La materia fecal es negra o tiene sangre.  · Siente dolor intenso en el pecho o en el abdomen.  ASEGÚRESE DE QUE:   · Comprende estas instrucciones.  · Controlará su enfermedad.  · Solicitará ayuda de inmediato si no mejora o si empeora.     Esta información no tiene como fin reemplazar el consejo del médico. Asegúrese de hacerle al médico cualquier pregunta que tenga.     Document Released: 08/30/2005 Document Revised: 05/24/2012  Elsevier Interactive Patient Education ©2016 Elsevier Inc.

## 2015-12-18 NOTE — ED Notes (Signed)
Iv x 2 attempted without success; second RN at bedside

## 2015-12-18 NOTE — ED Provider Notes (Signed)
CSN: [id number]     Arrival date & time 12/17/15  2245 History  By signing my name below, I, Freida Busman, attest that this documentation has been prepared under the direction and in the presence of Rolland Porter, MD . Electronically Signed: Freida Busman, Scribe. 12/18/2015. 3:09 AM.  Chief Complaint  Patient presents with  . Abdominal Pain   The history is provided by the patient. No language interpreter was used.    HPI Comments:  Karina Murphy is a 35 y.o. female who presents to the Emergency Department complaining of moderate, mid abdominal pain since 1200 yesterday (12/17/15). She reports associated nausea, intermittent vomiting x ~ 4 months and  fever with TMAX of 101 since yesterday. Pt also notes episode of bright red blood mixed with her stool 6 days ago; none since. No alleviating factors noted. Pt has a h/o RA, denies taking meds currently for arthritis. She also denies tobacco use.  NKDA  Past Medical History  Diagnosis Date  . Hypertension   . Arthritis, rheumatoid (HCC)   . Anxiety   . Chronic headaches    Past Surgical History  Procedure Laterality Date  . No past surgeries     Family History  Problem Relation Age of Onset  . Diabetes Mother   . Hypertension Mother   . Stomach cancer Neg Hx   . Colon cancer Neg Hx    Social History  Substance Use Topics  . Smoking status: Never Smoker   . Smokeless tobacco: Never Used  . Alcohol Use: No   OB History    No data available     Review of Systems  Constitutional: Positive for fever. Negative for chills, diaphoresis, appetite change and fatigue.  HENT: Negative for mouth sores, sore throat and trouble swallowing.   Eyes: Negative for visual disturbance.  Respiratory: Negative for cough, chest tightness, shortness of breath and wheezing.   Cardiovascular: Negative for chest pain.  Gastrointestinal: Positive for nausea, vomiting, abdominal pain and blood in stool (resolved). Negative for diarrhea and  abdominal distention.  Endocrine: Negative for polydipsia, polyphagia and polyuria.  Genitourinary: Negative for dysuria, frequency and hematuria.  Musculoskeletal: Negative for gait problem.  Skin: Negative for color change, pallor and rash.  Neurological: Negative for dizziness, syncope, light-headedness and headaches.  Hematological: Does not bruise/bleed easily.  Psychiatric/Behavioral: Negative for behavioral problems and confusion.  All other systems reviewed and are negative.  Allergies  Eggs or egg-derived products  Home Medications   Prior to Admission medications   Medication Sig Start Date End Date Taking? Authorizing Provider  albuterol (PROVENTIL HFA;VENTOLIN HFA) 108 (90 Base) MCG/ACT inhaler Inhale 2 puffs into the lungs every 4 (four) hours as needed for wheezing or shortness of breath. 11/13/15  Yes Hayden Rasmussen, NP  amoxicillin-clavulanate (AUGMENTIN) 875-125 MG tablet Take 1 tablet by mouth 2 (two) times daily. Patient not taking: Reported on 12/18/2015 11/21/15   Ambrose Finland, NP  cyclobenzaprine (FLEXERIL) 5 MG tablet Take 1 tablet (5 mg total) by mouth 3 (three) times daily as needed for muscle spasms. Back pain and/or headaches Patient not taking: Reported on 12/18/2015 06/12/15   Ambrose Finland, NP  diclofenac (VOLTAREN) 75 MG EC tablet Take 2 tablets (150 mg total) by mouth daily at 12 noon. Patient not taking: Reported on 12/18/2015 12/01/15   Ambrose Finland, NP  omeprazole (PRILOSEC) 20 MG capsule Take 1 capsule (20 mg total) by mouth 2 (two) times daily. 12/18/15   Rolland Porter, MD  ondansetron (  ZOFRAN ODT) 4 MG disintegrating tablet Take 1 tablet (4 mg total) by mouth every 8 (eight) hours as needed for nausea. 12/18/15   Rolland Porter, MD  ondansetron (ZOFRAN) 4 MG tablet Take 1 tablet (4 mg total) by mouth every 8 (eight) hours as needed for nausea or vomiting. Patient not taking: Reported on 12/18/2015 10/31/15   Napoleon Form, MD  predniSONE (DELTASONE) 20 MG tablet Take 3  tabs po on first day, 2 tabs second day, 2 tabs third day, 1 tab fourth day, 1 tab 5th day. Take with food. Patient not taking: Reported on 12/18/2015 11/13/15   Hayden Rasmussen, NP  triamcinolone ointment (KENALOG) 0.5 % Apply 1 application topically 2 (two) times daily. Patient not taking: Reported on 12/18/2015 11/21/15   Ambrose Finland, NP   BP 128/75 mmHg  Pulse 72  Temp(Src) 99 F (37.2 C) (Oral)  Resp 16  Ht 5\' 3"  (1.6 m)  Wt 184 lb 8 oz (83.689 kg)  BMI 32.69 kg/m2  SpO2 100%  LMP 12/04/2015 Physical Exam  Constitutional: She is oriented to person, place, and time. She appears well-developed and well-nourished. No distress.  HENT:  Head: Normocephalic.  Eyes: Conjunctivae are normal. Pupils are equal, round, and reactive to light. No scleral icterus.  Neck: Normal range of motion. Neck supple. No thyromegaly present.  Cardiovascular: Normal rate and regular rhythm.  Exam reveals no gallop and no friction rub.   No murmur heard. Pulmonary/Chest: Effort normal and breath sounds normal. No respiratory distress. She has no wheezes. She has no rales.  Abdominal: Soft. Bowel sounds are normal. She exhibits no distension. There is no tenderness. There is no rebound.  Musculoskeletal: Normal range of motion.  Neurological: She is alert and oriented to person, place, and time.  Skin: Skin is warm and dry. No rash noted.  Psychiatric: She has a normal mood and affect. Her behavior is normal.  Nursing note and vitals reviewed.   ED Course  Procedures   DIAGNOSTIC STUDIES:  Oxygen Saturation is 98% on RA, normal by my interpretation.    COORDINATION OF CARE:  3:02 AM Discussed treatment plan with pt at bedside and pt agreed to plan.   Labs Review Labs Reviewed  COMPREHENSIVE METABOLIC PANEL - Abnormal; Notable for the following:    Glucose, Bld 115 (*)    Total Protein 8.2 (*)    AST 13 (*)    ALT 11 (*)    All other components within normal limits  URINALYSIS, ROUTINE W REFLEX  MICROSCOPIC (NOT AT [facility]) - Abnormal; Notable for the following:    APPearance CLOUDY (*)    Leukocytes, UA SMALL (*)    All other components within normal limits  URINE MICROSCOPIC-ADD ON - Abnormal; Notable for the following:    Squamous Epithelial / LPF 0-5 (*)    Bacteria, UA FEW (*)    All other components within normal limits  LIPASE, BLOOD  CBC  POC URINE PREG, ED    Imaging Review No results found. I have personally reviewed and evaluated these images and lab results as part of my medical decision-making.   EKG Interpretation None      MDM   Final diagnoses:  Non-intractable vomiting with nausea, vomiting of unspecified type   Patient remained asymptomatic., After meds. Drinking and eating without difficulty. Plan is discharge home, Prilosec, Zofran, primary care follow-up.  I personally performed the services described in this documentation, which was scribed in my presence. The recorded information  has been reviewed and is accurate.    Rolland Porter, MD 12/18/15 [phone]

## 2015-12-22 ENCOUNTER — Ambulatory Visit: Payer: Self-pay | Admitting: Neurology

## 2015-12-22 DIAGNOSIS — Z029 Encounter for administrative examinations, unspecified: Secondary | ICD-10-CM

## 2015-12-26 ENCOUNTER — Ambulatory Visit: Payer: Self-pay | Admitting: Neurology

## 2016-01-07 ENCOUNTER — Ambulatory Visit (INDEPENDENT_AMBULATORY_CARE_PROVIDER_SITE_OTHER): Payer: Self-pay | Admitting: Gastroenterology

## 2016-01-07 ENCOUNTER — Encounter: Payer: Self-pay | Admitting: Gastroenterology

## 2016-01-07 ENCOUNTER — Ambulatory Visit: Payer: Self-pay | Attending: Internal Medicine | Admitting: Physician Assistant

## 2016-01-07 ENCOUNTER — Encounter: Payer: Self-pay | Admitting: Physician Assistant

## 2016-01-07 VITALS — BP 106/72 | HR 91 | Temp 98.7°F | Resp 18 | Ht 63.0 in | Wt 184.0 lb

## 2016-01-07 VITALS — BP 110/72 | HR 76 | Ht 63.0 in | Wt 184.0 lb

## 2016-01-07 DIAGNOSIS — I10 Essential (primary) hypertension: Secondary | ICD-10-CM | POA: Insufficient documentation

## 2016-01-07 DIAGNOSIS — R634 Abnormal weight loss: Secondary | ICD-10-CM

## 2016-01-07 DIAGNOSIS — R112 Nausea with vomiting, unspecified: Secondary | ICD-10-CM

## 2016-01-07 DIAGNOSIS — R197 Diarrhea, unspecified: Secondary | ICD-10-CM | POA: Insufficient documentation

## 2016-01-07 DIAGNOSIS — R1013 Epigastric pain: Secondary | ICD-10-CM

## 2016-01-07 DIAGNOSIS — R519 Headache, unspecified: Secondary | ICD-10-CM

## 2016-01-07 DIAGNOSIS — R51 Headache: Secondary | ICD-10-CM | POA: Insufficient documentation

## 2016-01-07 DIAGNOSIS — Z79899 Other long term (current) drug therapy: Secondary | ICD-10-CM | POA: Insufficient documentation

## 2016-01-07 DIAGNOSIS — G8929 Other chronic pain: Secondary | ICD-10-CM

## 2016-01-07 LAB — POCT URINALYSIS DIPSTICK
BILIRUBIN UA: NEGATIVE
GLUCOSE UA: NEGATIVE
KETONES UA: NEGATIVE
Leukocytes, UA: NEGATIVE
Nitrite, UA: NEGATIVE
RBC UA: NEGATIVE
SPEC GRAV UA: 1.02
UROBILINOGEN UA: 0.2
pH, UA: 5.5

## 2016-01-07 LAB — POCT URINE PREGNANCY: PREG TEST UR: NEGATIVE

## 2016-01-07 NOTE — Progress Notes (Signed)
Patient is here for Abdominal Pain and Headache  Patient complains of HA being present for the past day. Head ache is over the patients right eye. Pain is described as "pins and needles". Patient complains of epigastric discomfort described as "colic" and scaled at an 8 currently.  Patient has not taken any medication nor has the patient eaten today.

## 2016-01-07 NOTE — Patient Instructions (Signed)
Get immodium for diarrhea

## 2016-01-07 NOTE — Progress Notes (Signed)
Karina Murphy    [id number]    1981-05-18  Primary Care Physician:Valerie Joellen Jersey, NP  Referring Physician: Ambrose Finland, NP [address] [place], [place] [address]  Chief complaint:  Nausea and vomiting , epigastric pain   HPI: 35 year old female here for follow-up visit with complaints of persistent epigastric pain associated with nausea and vomiting. He continues to vomit with every meal. Sometimes she feels she is choking after eating 2 or 3 bites of food and feels full but denies any dysphagia or odynophagia. She continues to lose weight, she went from 230 pounds in May 2016 to current weight of 184 pounds and has lost about 35 pounds in the last 1 year. She is tries to eat small frequent meals and is taking Zofran with no significant improvement. She is also on omeprazole twice daily with no significant difference. Denies any heartburn or regurgitation. No history of herbal supplements or recreational drug use. She had rapid emptying on gastric emptying scan with almost 98% emptied at 2 hours. Normal gallbladder ejection fraction of 56.8% no evidence of cystic duct or CBD obstruction on HIDA. CT abdomen and pelvis with contrast was unremarkable. CT head was normal. Abdominal ultrasound did not show any abnormality there. EGD was normal other than mild gastropathy in the antrum.   Outpatient Encounter Prescriptions as of 01/07/2016  Medication Sig  . cyclobenzaprine (FLEXERIL) 5 MG tablet Take 1 tablet (5 mg total) by mouth 3 (three) times daily as needed for muscle spasms. Back pain and/or headaches  . omeprazole (PRILOSEC) 20 MG capsule Take 1 capsule (20 mg total) by mouth 2 (two) times daily.  . ondansetron (ZOFRAN ODT) 4 MG disintegrating tablet Take 1 tablet (4 mg total) by mouth every 8 (eight) hours as needed for nausea.  . [DISCONTINUED] albuterol (PROVENTIL HFA;VENTOLIN HFA) 108 (90 Base) MCG/ACT inhaler Inhale 2 puffs into the lungs every 4 (four) hours  as needed for wheezing or shortness of breath.  . [DISCONTINUED] amoxicillin-clavulanate (AUGMENTIN) 875-125 MG tablet Take 1 tablet by mouth 2 (two) times daily. (Patient not taking: Reported on 12/18/2015)  . [DISCONTINUED] diclofenac (VOLTAREN) 75 MG EC tablet Take 2 tablets (150 mg total) by mouth daily at 12 noon.  . [DISCONTINUED] ondansetron (ZOFRAN) 4 MG tablet Take 1 tablet (4 mg total) by mouth every 8 (eight) hours as needed for nausea or vomiting. (Patient not taking: Reported on 12/18/2015)  . [DISCONTINUED] predniSONE (DELTASONE) 20 MG tablet Take 3 tabs po on first day, 2 tabs second day, 2 tabs third day, 1 tab fourth day, 1 tab 5th day. Take with food. (Patient not taking: Reported on 12/18/2015)  . [DISCONTINUED] triamcinolone ointment (KENALOG) 0.5 % Apply 1 application topically 2 (two) times daily.   No facility-administered encounter medications on file as of 01/07/2016.    Allergies as of 01/07/2016 - Review Complete 01/07/2016  Allergen Reaction Noted  . Eggs or egg-derived products Nausea And Vomiting 10/06/2015    Past Medical History  Diagnosis Date  . Hypertension   . Arthritis, rheumatoid (HCC)   . Anxiety   . Chronic headaches     Past Surgical History  Procedure Laterality Date  . No past surgeries      Family History  Problem Relation Age of Onset  . Diabetes Mother   . Hypertension Mother   . Stomach cancer Neg Hx   . Colon cancer Neg Hx     Social History  Social History  . Marital Status: Single    Spouse Name: N/A  . Number of Children: 2  . Years of Education: N/A   Occupational History  . Not on file.   Social History Main Topics  . Smoking status: Never Smoker   . Smokeless tobacco: Never Used  . Alcohol Use: No  . Drug Use: No  . Sexual Activity: Yes    Birth Control/ Protection: Condom   Other Topics Concern  . Not on file   Social History Narrative   Completed 2nd grade.       Review of systems: Review of Systems    Constitutional: Negative for fever and chills.  HENT: Negative.   Eyes: Negative for blurred vision.  Respiratory: Negative for cough, shortness of breath and wheezing.   Cardiovascular: Negative for chest pain and palpitations.  Gastrointestinal: as per HPI Genitourinary: Negative for dysuria, urgency, frequency and hematuria.  Musculoskeletal: Negative for myalgias, back pain and joint pain.  Skin: Negative for itching and rash.  Neurological: Negative for dizziness, tremors, focal weakness, seizures and loss of consciousness.  Endo/Heme/Allergies: Negative for environmental allergies.  Psychiatric/Behavioral: Negative for depression, suicidal ideas and hallucinations.  All other systems reviewed and are negative.   Physical Exam: Filed Vitals:   01/07/16 1557  BP: 110/72  Pulse: 76   Gen:      No acute distress HEENT:  EOMI, sclera anicteric Neck:     No masses; no thyromegaly Lungs:    Clear to auscultation bilaterally; normal respiratory effort CV:         Regular rate and rhythm; no murmurs Abd:      + bowel sounds; soft, non-tender; no palpable masses, no distension Ext:    No edema; adequate peripheral perfusion Skin:      Warm and dry; no rash Neuro: alert and oriented x 3 Psych: normal mood and affect  Data Reviewed:  Reviewed chart in epic and past GI workup as per history of present illness   Assessment and Plan/Recommendations:  35 year old female with complaints of epigastric pain, intractable nausea and vomiting here for follow-up visit She is continuing to lose weight and has lost about 35 pounds in the last 1 year So far workup is negative for any significant pathology to explain her symptoms Patient may have a functional component but her weight loss is concerning We'll obtain upper GI series, if that does not show any abnormality, we'll consider referral to [facility] for further evaluation Patient was given information for low-fodmap diet  K. Scherry Ran , MD [phone] Mon-Fri 8a-5p [phone] after 5p, weekends, holidays

## 2016-01-07 NOTE — Patient Instructions (Signed)
You have been scheduled for an Upper GI Series at Your appointment is on 01/15/2016 at 9:15am . Please arrive 15 minutes prior to your test for registration. Make sure not to eat or drink anything after midnight on the night before your test. If you need to reschedule, please call radiology at [phone]. ________________________________________________________________ An upper GI series uses x rays to help diagnose problems of the upper GI tract, which includes the esophagus, stomach, and duodenum. The duodenum is the first part of the small intestine. An upper GI series is conducted by a radiology technologist or a radiologist-a doctor who specializes in x-ray imaging-at a hospital or outpatient center. While sitting or standing in front of an x-ray machine, the patient drinks barium liquid, which is often white and has a chalky consistency and taste. The barium liquid coats the lining of the upper GI tract and makes signs of disease show up more clearly on x rays. X-ray video, called fluoroscopy, is used to view the barium liquid moving through the esophagus, stomach, and duodenum. Additional x rays and fluoroscopy are performed while the patient lies on an x-ray table. To fully coat the upper GI tract with barium liquid, the technologist or radiologist may press on the abdomen or ask the patient to change position. Patients hold still in various positions, allowing the technologist or radiologist to take x rays of the upper GI tract at different angles. If a technologist conducts the upper GI series, a radiologist will later examine the images to look for problems.  This test typically takes about 1 hour to complete. __________________________________________________________________

## 2016-01-07 NOTE — Progress Notes (Signed)
Patient ID: Karina Murphy, female   DOB: 1980-12-06, 35 y.o.   MRN: [id number]   Karina Murphy, is a 35 y.o. female  PJA:[id number]  HAL:[id number]  DOB - [birth year]  Chief Complaint  Patient presents with  . Abdominal Pain        Subjective:  Chief Complaint and HPI: Karina Murphy is a 35 y.o. female here today for 3 day history of 7-8 episodes of diarrhea and a headache.  She c/o R sided headache with dizziness when she changes positions.  She denies travel or eating undercooked foods.  She denies any fever/chills since early in April.  The headache is not as severe as when she has had migraine headaches in the past.  History is via phone interpreter.  She does relate having gone to the ED early in April for stomach pain.  She also relates a several month history of nausea and vomiting daily.  Usu occurs after eating a meal.  Her last period was 12/27/15 and normal.  Denies possibility of pregnancy.  She has an appointment today with a gastroenterologist at 4pm.    Recent ED/Hospital notes/labs reviewed.    ROS:   Constitutional:  No f/c, No night sweats, No unexplained weight loss. EENT:  No vision changes, No blurry vision, No hearing changes. No mouth, throat, or ear problems.  Respiratory: No cough, No SOB Cardiac: No CP, no palpitations GI:  No abd pain GU: No Urinary s/sx Musculoskeletal: No joint pain Neuro: + headache, +mild dizziness, no motor weakness.  Skin: No rash Endocrine:  No polydipsia. No polyuria.  Psych: Denies SI/HI    ALLERGIES: Allergies  Allergen Reactions  . Eggs Or Egg-Derived Products Nausea And Vomiting    Nausea/vomitting only with whole eggs, no problem with eggs used in ingredients    PAST MEDICAL HISTORY: Past Medical History  Diagnosis Date  . Hypertension   . Arthritis, rheumatoid (HCC)   . Anxiety   . Chronic headaches     MEDICATIONS AT HOME: Prior to Admission medications   Medication Sig Start Date End  Date Taking? Authorizing Provider  albuterol (PROVENTIL HFA;VENTOLIN HFA) 108 (90 Base) MCG/ACT inhaler Inhale 2 puffs into the lungs every 4 (four) hours as needed for wheezing or shortness of breath. 11/13/15  Yes Hayden Rasmussen, NP  cyclobenzaprine (FLEXERIL) 5 MG tablet Take 1 tablet (5 mg total) by mouth 3 (three) times daily as needed for muscle spasms. Back pain and/or headaches 06/12/15  Yes Ambrose Finland, NP  diclofenac (VOLTAREN) 75 MG EC tablet Take 2 tablets (150 mg total) by mouth daily at 12 noon. 12/01/15  Yes Ambrose Finland, NP  omeprazole (PRILOSEC) 20 MG capsule Take 1 capsule (20 mg total) by mouth 2 (two) times daily. 12/18/15  Yes Rolland Porter, MD  ondansetron (ZOFRAN ODT) 4 MG disintegrating tablet Take 1 tablet (4 mg total) by mouth every 8 (eight) hours as needed for nausea. 12/18/15  Yes Rolland Porter, MD  triamcinolone ointment (KENALOG) 0.5 % Apply 1 application topically 2 (two) times daily. 11/21/15  Yes Ambrose Finland, NP  amoxicillin-clavulanate (AUGMENTIN) 875-125 MG tablet Take 1 tablet by mouth 2 (two) times daily. Patient not taking: Reported on 12/18/2015 11/21/15   Ambrose Finland, NP  ondansetron (ZOFRAN) 4 MG tablet Take 1 tablet (4 mg total) by mouth every 8 (eight) hours as needed for nausea or vomiting. Patient not taking: Reported on 12/18/2015 10/31/15   Napoleon Form, MD  predniSONE (DELTASONE) 20 MG tablet  Take 3 tabs po on first day, 2 tabs second day, 2 tabs third day, 1 tab fourth day, 1 tab 5th day. Take with food. Patient not taking: Reported on 12/18/2015 11/13/15   Hayden Rasmussen, NP     Objective:  EXAM:   Filed Vitals:   01/07/16 1406  BP: 106/72  Pulse: 91  Temp: 98.7 F (37.1 C)  TempSrc: Oral  Resp: 18  Height: 5\' 3"  (1.6 m)  Weight: 184 lb (83.462 kg)  SpO2: 100%    General appearance : A&OX3. NAD. Non-toxic-appearing HEENT: Atraumatic and Normocephalic.  PERRLA. EOM intact.  TM clear B. Mouth-MMM, post pharynx WNL w/o erythema, No PND. Neck: supple,  no JVD. No cervical lymphadenopathy. No thyromegaly Chest/Lungs:  Breathing-non-labored, Good air entry bilaterally, breath sounds normal without rales, rhonchi, or wheezing  CVS: S1 S2 regular, no murmurs, gallops, rubs  Abdomen: Bowel sounds present, Non tender and not distended with no gaurding, rigidity or rebound except mild +TTP RUQ with +/-Murphy's.  Neg McBurneys.  No acute abdominal signs Extremities: Bilateral Lower Ext shows no edema, both legs are warm to touch with = pulse throughout Neurology:  CN II-XII grossly intact, Non focal.   Psych:  TP linear. J/I WNL. Normal speech. Appropriate eye contact and affect.  Skin:  No Rash  Data Review Lab Results  Component Value Date   HGBA1C 5.50 06/12/2015   HGBA1C 5.7* 12/26/2013   Uhcg: negative UA dip;  No infection  Assessment & Plan   1. Diarrhea, unspecified type Try immodium, fluids, and rest.  Seems that she has a viral gastroenteritis in addition to her ongoing nausea and vomiting.  She has and should keep a GI appointment this afternoon at 4pm.  I suspect she has underlying PUD, gallbladder dz, or other.   2. Acute nonintractable headache, unspecified headache type oTry tylenol.  Neurological exam is benign.  F/up with neurologist if headaches persist(sh has a neurologist)  3. Nausea and vomiting, intractability of vomiting not specified, unspecified vomiting type This has been occurring for several months now.  She has and should keep a GI appointment this afternoon at 4pm.  I suspect she has underlying PUD or gallbladder dz.  - POCT urinalysis dipstick   Patient have been counseled extensively about nutrition and exercise   The patient was given clear instructions to go to ER or return to medical center if symptoms don't improve, worsen or new problems develop. The patient verbalized understanding. The patient was told to call to get lab results if they haven't heard anything in the next week.     12/28/2013,  PA-C [facility], [place]   01/07/2016, 2:23 PM

## 2016-01-10 IMAGING — NM NM HEPATO W/GB/PHARM/[PERSON_NAME]
2 series · 12 of 12 positions shown · non-contrast
Comparison: CT scan 09/23/2014

CLINICAL DATA: Right upper quadrant pain

EXAM:
NUCLEAR MEDICINE HEPATOBILIARY IMAGING WITH GALLBLADDER EF
TECHNIQUE: Sequential images of the abdomen were obtained [DATE] minutes
following intravenous administration of radiopharmaceutical. After
oral ingestion of Ensure, gallbladder ejection fraction was
determined. At 60 min, normal ejection fraction is greater than 33%.
RADIOPHARMACEUTICALS:  5.0 Millicurie 4c-RRm Choletec

[he hepatobiliary · 3.43mm/px · 6 of 60 frames shown (1 of 2)]
[frame 6/60]
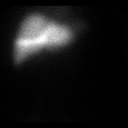
[frame 16/60]
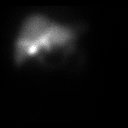
[frame 26/60]
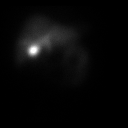
[frame 36/60]
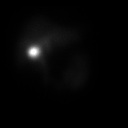
[frame 46/60]
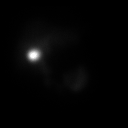
[frame 56/60]
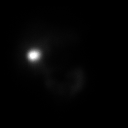

[he hepatobiliary · 3.43mm/px · 6 of 60 frames shown (2 of 2)]
[frame 6/60]
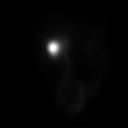
[frame 16/60]
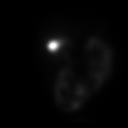
[frame 26/60]
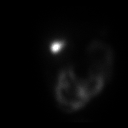
[frame 36/60]
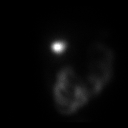
[frame 46/60]
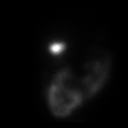
[frame 56/60]
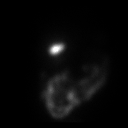

[12 of 12 positions shown; findings below may reference images not displayed]

FINDINGS: There is normal uptake of the tracer by the liver. Gallbladder
visualized at 25 min. Bowel activity noted at 30 min. Gallbladder
ejection fraction: 56.8 %. Normal gallbladder ejection fraction with
Ensure is greater than 33%. The patient did not experience symptoms
after oral ingestion of Ensure.
IMPRESSION: No cystic duct obstruction. No CBD obstruction. Post Ensure
gallbladder ejection fraction is 56.8%. Normal gallbladder ejection
fraction should be greater than 33%.

## 2016-01-15 ENCOUNTER — Ambulatory Visit (HOSPITAL_COMMUNITY)
Admission: RE | Admit: 2016-01-15 | Discharge: 2016-01-15 | Disposition: A | Discharge status: Home / Self Care | Payer: Self-pay | Source: Ambulatory Visit | Attending: Gastroenterology | Admitting: Gastroenterology

## 2016-01-15 DIAGNOSIS — K219 Gastro-esophageal reflux disease without esophagitis: Secondary | ICD-10-CM | POA: Insufficient documentation

## 2016-01-15 DIAGNOSIS — K449 Diaphragmatic hernia without obstruction or gangrene: Secondary | ICD-10-CM | POA: Insufficient documentation

## 2016-01-15 DIAGNOSIS — R112 Nausea with vomiting, unspecified: Secondary | ICD-10-CM | POA: Insufficient documentation

## 2016-02-03 ENCOUNTER — Other Ambulatory Visit: Payer: Self-pay

## 2016-02-25 ENCOUNTER — Encounter: Payer: Self-pay | Admitting: Physician Assistant

## 2016-02-25 ENCOUNTER — Ambulatory Visit: Payer: Self-pay | Attending: Internal Medicine | Admitting: Physician Assistant

## 2016-02-25 VITALS — BP 126/84 | HR 82 | Temp 98.8°F | Wt 186.8 lb

## 2016-02-25 DIAGNOSIS — J069 Acute upper respiratory infection, unspecified: Secondary | ICD-10-CM

## 2016-02-25 MED ORDER — THERA VITAL M PO TABS: 1.0000 | ORAL_TABLET | Freq: Every day | ORAL | Status: DC

## 2016-02-25 MED ORDER — AZITHROMYCIN 250 MG PO TABS: ORAL_TABLET | ORAL | Status: DC

## 2016-02-25 MED FILL — AZITHROMYCIN 250 MG TABLET: 250 | 5 days supply | Qty: 6 | Fill #0

## 2016-02-25 NOTE — Progress Notes (Signed)
Chief Complaint: "I feel bad"  Subjective: This is a pleasant 35 year old Hispanic female with history of rheumatoid arthritis, hypertension, and generalized anxiety who presents with 1 week of upper respiratory symptoms. She states that she had a fever 3 days ago up to 101. She also had chills. She had sore throat but no white spots. She's been a little short of breath. She's had a cough with mucus production. No headache. Some dizziness. She is not currently taking any routine medications. She denies recently had any sick contacts.  She also is requesting a multivitamin.   ROS:  GEN: + fever and chills, denies change in weight Skin: denies lesions or rashes HEENT: denies headache, earache, epistaxis, +sore throat, no neck pain LUNGS:+ SHOB, +dyspnea, PND, orthopnea   Objective:  Filed Vitals:   02/25/16 1459  BP: 126/84  Pulse: 82  Temp: 98.8 F (37.1 C)  TempSrc: Oral  Weight: 186 lb 12.8 oz (84.732 kg)    Physical Exam:  General: in no acute distress. HEENT: no pallor, no icterus, moist oral mucosa, no JVD, no lymphadenopathy-benign Heart: Normal  s1 &s2  Regular rate and rhythm, without murmurs, rubs, gallops. Lungs: Clear to auscultation bilaterally. Neuro: Alert, awake, oriented x3, nonfocal.   Medications: Prior to Admission medications   Medication Sig Start Date End Date Taking? Authorizing Provider  cyclobenzaprine (FLEXERIL) 5 MG tablet Take 1 tablet (5 mg total) by mouth 3 (three) times daily as needed for muscle spasms. Back pain and/or headaches 06/12/15  Yes Ambrose Finland, NP  omeprazole (PRILOSEC) 20 MG capsule Take 1 capsule (20 mg total) by mouth 2 (two) times daily. 12/18/15  Yes Rolland Porter, MD  ondansetron (ZOFRAN ODT) 4 MG disintegrating tablet Take 1 tablet (4 mg total) by mouth every 8 (eight) hours as needed for nausea. 12/18/15  Yes Rolland Porter, MD  azithromycin (ZITHROMAX) 250 MG tablet 2 tabs today then 1 tab daily for 4 days 02/25/16   Vivianne Master,  PA-C  Multiple Vitamins-Minerals (MULTIVITAMIN) tablet Take 1 tablet by mouth daily. 02/25/16   Vivianne Master, PA-C    Assessment: 1. URI, likely viral  Plan: Z pak MVI  Follow up:prn or as scheduled with PCP  The patient was given clear instructions to go to ER or return to medical center if symptoms don't improve, worsen or new problems develop. The patient verbalized understanding. The patient was told to call to get lab results if they haven't heard anything in the next week.   This note has been created with Education officer, environmental. Any transcriptional errors are unintentional.   Scot Jun, PA-C 02/25/2016, 3:21 PM

## 2016-07-07 ENCOUNTER — Ambulatory Visit: Payer: Self-pay

## 2016-07-26 ENCOUNTER — Ambulatory Visit: Payer: Self-pay | Attending: Family Medicine | Admitting: Family Medicine

## 2016-07-26 ENCOUNTER — Encounter: Payer: Self-pay | Admitting: Family Medicine

## 2016-07-26 VITALS — BP 115/76 | HR 78 | Temp 99.3°F | Ht 63.0 in | Wt 178.6 lb

## 2016-07-26 DIAGNOSIS — Z114 Encounter for screening for human immunodeficiency virus [HIV]: Secondary | ICD-10-CM

## 2016-07-26 DIAGNOSIS — M059 Rheumatoid arthritis with rheumatoid factor, unspecified: Secondary | ICD-10-CM | POA: Insufficient documentation

## 2016-07-26 DIAGNOSIS — Z79899 Other long term (current) drug therapy: Secondary | ICD-10-CM | POA: Insufficient documentation

## 2016-07-26 DIAGNOSIS — R1013 Epigastric pain: Secondary | ICD-10-CM | POA: Insufficient documentation

## 2016-07-26 LAB — POCT URINALYSIS DIPSTICK
Bilirubin, UA: NEGATIVE
Blood, UA: NEGATIVE
Glucose, UA: NEGATIVE
KETONES UA: NEGATIVE
LEUKOCYTES UA: NEGATIVE
Nitrite, UA: NEGATIVE
PH UA: 5.5
PROTEIN UA: NEGATIVE
Spec Grav, UA: 1.02
UROBILINOGEN UA: 0.2

## 2016-07-26 LAB — POCT URINE PREGNANCY: PREG TEST UR: NEGATIVE

## 2016-07-26 MED ORDER — OMEPRAZOLE 20 MG PO CPDR: 20.0000 mg | DELAYED_RELEASE_CAPSULE | Freq: Two times a day (BID) | ORAL | 2 refills | Status: DC

## 2016-07-26 MED ORDER — SULFASALAZINE 500 MG PO TABS: ORAL_TABLET | ORAL | 2 refills | Status: DC

## 2016-07-26 MED ORDER — DICLOFENAC SODIUM 75 MG PO TBEC: 75.0000 mg | DELAYED_RELEASE_TABLET | Freq: Two times a day (BID) | ORAL | 2 refills | Status: DC | PRN

## 2016-07-26 MED ORDER — PROMETHAZINE HCL 12.5 MG PO TABS: 12.5000 mg | ORAL_TABLET | Freq: Three times a day (TID) | ORAL | 2 refills | Status: DC | PRN

## 2016-07-26 MED FILL — ?OMEPRAZOLE DR 20 MG CAPSUL: 20 | 30 days supply | Qty: 60 | Fill #0

## 2016-07-26 MED FILL — ?DICLOFENAC SOD DR 75 MG TA: 75 | 30 days supply | Qty: 60 | Fill #0

## 2016-07-26 MED FILL — PROMETHAZINE 12.5 MG TABLET: 12.5 | 10 days supply | Qty: 30 | Fill #0

## 2016-07-26 NOTE — Patient Instructions (Signed)
Karina Murphy was seen today for abdominal pain and joint pain.  Diagnoses and all orders for this visit:  Rheumatoid arthritis with positive rheumatoid factor, involving unspecified site (HCC) -     diclofenac (VOLTAREN) 75 MG EC tablet; Take 1 tablet (75 mg total) by mouth 2 (two) times daily as needed for moderate pain. -     Ambulatory referral to Rheumatology -     CBC -     COMPLETE METABOLIC PANEL WITH GFR -     sulfaSALAzine (AZULFIDINE) 500 MG tablet; Take 500 mg daily for first week, then 500 mg twice daily for week two, then 1000 mg in AM and 500 mg in PM week three, then 1000 mg twice daily  Abdominal pain, epigastric -     COMPLETE METABOLIC PANEL WITH GFR -     Lipase -     promethazine (PHENERGAN) 12.5 MG tablet; Take 1 tablet (12.5 mg total) by mouth every 8 (eight) hours as needed for nausea or vomiting. -     omeprazole (PRILOSEC) 20 MG capsule; Take 1 capsule (20 mg total) by mouth 2 (two) times daily before a meal. -     POCT urinalysis dipstick -     POCT urine pregnancy  Screening for HIV (human immunodeficiency virus) -     HIV antibody (with reflex)    F/u in 3-4  Weeks for rheumatoid arthritis, abdominal pain f/u in pap  Dr. Armen Pickup

## 2016-07-26 NOTE — Progress Notes (Signed)
Subjective:  Patient ID: Karina Murphy, female    DOB: April 23, 1981  Age: 35 y.o. MRN: [id number]  CC: Abdominal Pain and Joint Pain   HPI Karina Murphy has rheumatoid arthritis she presents for   1. Stomach ache: for 2-3 weeks. Every time she eats she gets epigastric pain, nausea and emesis. Some days symptoms occur with any food. Other days symptoms occur with eating greasy or fatty foods. She has an on her father's side who had her gallbladder removed. She is on her period now. She last vomited 2 nights ago. She has similar symptoms in 2016. She had a normal abdominal ultrasound on 03/2015 except for hepatic steatosis. She also had a normal  EGD on 10/06/2015.  She is not sexually active and currently on her menstrual period. She denies dysuria, heartburn. She has slight sore throat. Feels feverish at home at times.   2. Joint pains: she has rheumatoid arthritis. Diagnosed in 12/2013. She has been out of her medication for 7 months. She was previously treated with methotrexate which upset her stomach and sulfasalazine by Rheumatology at [facility].  She is noticing pain, stiffness and deformity in her joints especially her hands. Current level of joint pain is 7/10. It gets up to 10/10. Her pain is in her hands, wrist feet and both shoulders.   Social History  Substance Use Topics  . Smoking status: Never Smoker  . Smokeless tobacco: Never Used  . Alcohol use No    Outpatient Medications Prior to Visit  Medication Sig Dispense Refill  . azithromycin (ZITHROMAX) 250 MG tablet 2 tabs today then 1 tab daily for 4 days (Patient not taking: Reported on 07/26/2016) 6 tablet 0  . cyclobenzaprine (FLEXERIL) 5 MG tablet Take 1 tablet (5 mg total) by mouth 3 (three) times daily as needed for muscle spasms. Back pain and/or headaches (Patient not taking: Reported on 07/26/2016) 30 tablet 1  . Multiple Vitamins-Minerals (MULTIVITAMIN) tablet Take 1 tablet by mouth daily. (Patient not taking:  Reported on 07/26/2016) 90 tablet 1  . omeprazole (PRILOSEC) 20 MG capsule Take 1 capsule (20 mg total) by mouth 2 (two) times daily. (Patient not taking: Reported on 07/26/2016) 60 capsule 1  . ondansetron (ZOFRAN ODT) 4 MG disintegrating tablet Take 1 tablet (4 mg total) by mouth every 8 (eight) hours as needed for nausea. (Patient not taking: Reported on 07/26/2016) 6 tablet 0   No facility-administered medications prior to visit.     ROS Review of Systems  Constitutional: Negative for chills and fever.  Eyes: Negative for visual disturbance.  Respiratory: Negative for shortness of breath.   Cardiovascular: Negative for chest pain.  Gastrointestinal: Positive for abdominal pain, nausea and vomiting. Negative for blood in stool.  Musculoskeletal: Positive for arthralgias and joint swelling. Negative for back pain.  Skin: Negative for rash.  Allergic/Immunologic: Negative for immunocompromised state.  Hematological: Negative for adenopathy. Does not bruise/bleed easily.  Psychiatric/Behavioral: Negative for dysphoric mood and suicidal ideas.    Objective:  BP 115/76 (BP Location: Left Arm, Patient Position: Sitting, Cuff Size: Small)   Pulse 78   Temp 99.3 F (37.4 C) (Oral)   Ht 5\' 3"  (1.6 m)   Wt 178 lb 9.6 oz (81 kg)   LMP 07/26/2016   SpO2 100%   BMI 31.64 kg/m   BP/Weight 07/26/2016 02/25/2016 01/07/2016  Systolic BP 115 126 106  Diastolic BP 76 84 72  Wt. (Lbs) 178.6 186.8 184  BMI 31.64 33.1 32.6   Physical Exam  Constitutional: She is oriented to person, place, and time. She appears well-developed and well-nourished. No distress.  HENT:  Head: Normocephalic and atraumatic.  Mouth/Throat: No oropharyngeal exudate.  Eyes: Conjunctivae are normal. Pupils are equal, round, and reactive to light.  Cardiovascular: Normal rate, regular rhythm, normal heart sounds and intact distal pulses.   Pulmonary/Chest: Effort normal and breath sounds normal.  Abdominal: Soft. Bowel  sounds are normal. She exhibits no distension and no mass. There is no tenderness. There is no rebound and no guarding.  Musculoskeletal: She exhibits no edema.       Hands: Neurological: She is alert and oriented to person, place, and time.  Skin: Skin is warm and dry. No rash noted.  Psychiatric: She has a normal mood and affect.   UA: wnl U preg: negative   Assessment & Plan:  Karina Murphy was seen today for abdominal pain and joint pain.  Diagnoses and all orders for this visit:  Rheumatoid arthritis with positive rheumatoid factor, involving unspecified site (HCC) -     diclofenac (VOLTAREN) 75 MG EC tablet; Take 1 tablet (75 mg total) by mouth 2 (two) times daily as needed for moderate pain. -     Ambulatory referral to Rheumatology -     CBC -     COMPLETE METABOLIC PANEL WITH GFR -     sulfaSALAzine (AZULFIDINE) 500 MG tablet; Take 500 mg daily for first week, then 500 mg twice daily for week two, then 1000 mg in AM and 500 mg in PM week three, then 1000 mg twice daily  Abdominal pain, epigastric -     COMPLETE METABOLIC PANEL WITH GFR -     Lipase -     promethazine (PHENERGAN) 12.5 MG tablet; Take 1 tablet (12.5 mg total) by mouth every 8 (eight) hours as needed for nausea or vomiting. -     Discontinue: omeprazole (PRILOSEC) 20 MG capsule; Take 1 capsule (20 mg total) by mouth 2 (two) times daily before a meal. -     POCT urinalysis dipstick -     POCT urine pregnancy -     omeprazole (PRILOSEC) 20 MG capsule; Take 1 capsule (20 mg total) by mouth 2 (two) times daily before a meal.  Screening for HIV (human immunodeficiency virus) -     HIV antibody (with reflex)  There are no diagnoses linked to this encounter.  No orders of the defined types were placed in this encounter.   Follow-up: Return in about 4 weeks (around 08/23/2016) for pap smear, also f/u RA and abdominal pain .   [name] MD

## 2016-07-26 NOTE — Progress Notes (Signed)
Pt is here today for joint pain. Pt is also having abdominal pains.

## 2016-07-27 DIAGNOSIS — R1013 Epigastric pain: Secondary | ICD-10-CM | POA: Insufficient documentation

## 2016-07-27 LAB — COMPLETE METABOLIC PANEL WITH GFR
ALBUMIN: 4.2 g/dL (ref 3.6–5.1)
ALK PHOS: 63 U/L (ref 33–115)
ALT: 9 U/L (ref 6–29)
AST: 11 U/L (ref 10–30)
BILIRUBIN TOTAL: 0.6 mg/dL (ref 0.2–1.2)
BUN: 10 mg/dL (ref 7–25)
CO2: 27 mmol/L (ref 20–31)
Calcium: 10 mg/dL (ref 8.6–10.2)
Chloride: 103 mmol/L (ref 98–110)
Creat: 0.8 mg/dL (ref 0.50–1.10)
GFR, Est African American: >89 mL/min (ref >=60)
GFR, Est Non African American: >89 mL/min (ref >=60)
GLUCOSE: 95 mg/dL (ref 65–99)
Potassium: 4.5 mmol/L (ref 3.5–5.3)
SODIUM: 139 mmol/L (ref 135–146)
TOTAL PROTEIN: 8.1 g/dL (ref 6.1–8.1)

## 2016-07-27 LAB — HIV ANTIBODY (ROUTINE TESTING W REFLEX): HIV 1&2 Ab, 4th Generation: NONREACTIVE

## 2016-07-27 LAB — CBC
HCT: 38 % (ref 35.0–45.0)
HEMOGLOBIN: 12.1 g/dL (ref 11.7–15.5)
MCH: 28 pg (ref 27.0–33.0)
MCHC: 31.8 g/dL — ABNORMAL LOW (ref 32.0–36.0)
MCV: 88 fL (ref 80.0–100.0)
MPV: 9.9 fL (ref 7.5–12.5)
Platelets: 415 10*3/uL — ABNORMAL HIGH (ref 140–400)
RBC: 4.32 MIL/uL (ref 3.80–5.10)
RDW: 14.5 % (ref 11.0–15.0)
WBC: 10.5 10*3/uL (ref 3.8–10.8)

## 2016-07-27 LAB — LIPASE: Lipase: 5 U/L — ABNORMAL LOW (ref 7–60)

## 2016-07-27 NOTE — Assessment & Plan Note (Signed)
Recurrent epigastric pains with nausea and emesis  She had a negative w/u last year  Symptoms sound like GERD vs biliary tract disease Normal UA, negative U preg, normal CMP and CBC   Plan: Phenergan for nausea prilosec

## 2016-07-27 NOTE — Assessment & Plan Note (Signed)
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

## 2016-07-28 ENCOUNTER — Telehealth: Payer: Self-pay

## 2016-07-28 NOTE — Telephone Encounter (Signed)
Pt was called and informed of lab results. 

## 2016-07-30 ENCOUNTER — Ambulatory Visit: Payer: Self-pay | Attending: Internal Medicine

## 2016-08-24 ENCOUNTER — Telehealth: Payer: Self-pay | Admitting: Family Medicine

## 2016-08-24 NOTE — Telephone Encounter (Signed)
Will route to PCP 

## 2016-08-24 NOTE — Telephone Encounter (Signed)
Patient called the office to speak with PCP regarding her medication. Pt is having a allergic reaction to the current medication (diclofenac) that she is taking, bumps and itchiness. Pt also stated that medication is not helping with the pain. Pt would like a new medication to be called in that will help her with the pain. Please follow up. Pt gave the verbal ok to leave a detailed message if she doesn't answer. Please call rx to our pharmacy.   Thank you.

## 2016-08-24 NOTE — Telephone Encounter (Signed)
Please call patient Patient advised to stop diclofenac Take benadryl for bumps and itching Has she had this problem with OTC ibuprofen (advil) or naproxen (aleve)?  If not, please send in naproxen 500 mg BID with food Disp # 60 Refills 0

## 2016-08-25 MED ORDER — NAPROXEN 500 MG PO TABS: 500.0000 mg | ORAL_TABLET | Freq: Two times a day (BID) | ORAL | 0 refills | Status: DC

## 2016-08-25 MED FILL — NAPROXEN 500 MG TABLET: 500 | 30 days supply | Qty: 60 | Fill #0

## 2016-08-25 NOTE — Telephone Encounter (Signed)
Pt was called and there was no answer a VM was left informing pt to return phone call.  If patient calls back please inform her to stop taking diclofenac and take Benadryl for itching and I sent a script for naproxen to her pharmacy.

## 2016-08-31 ENCOUNTER — Encounter: Payer: Self-pay | Admitting: Family Medicine

## 2016-08-31 ENCOUNTER — Ambulatory Visit: Payer: Self-pay | Attending: Family Medicine | Admitting: Family Medicine

## 2016-08-31 VITALS — BP 115/77 | HR 79 | Temp 98.8°F | Ht 63.0 in | Wt 181.8 lb

## 2016-08-31 DIAGNOSIS — L282 Other prurigo: Secondary | ICD-10-CM | POA: Insufficient documentation

## 2016-08-31 DIAGNOSIS — M069 Rheumatoid arthritis, unspecified: Secondary | ICD-10-CM | POA: Insufficient documentation

## 2016-08-31 DIAGNOSIS — L298 Other pruritus: Secondary | ICD-10-CM | POA: Insufficient documentation

## 2016-08-31 DIAGNOSIS — Z79899 Other long term (current) drug therapy: Secondary | ICD-10-CM | POA: Insufficient documentation

## 2016-08-31 DIAGNOSIS — R509 Fever, unspecified: Secondary | ICD-10-CM | POA: Insufficient documentation

## 2016-08-31 DIAGNOSIS — M059 Rheumatoid arthritis with rheumatoid factor, unspecified: Secondary | ICD-10-CM

## 2016-08-31 DIAGNOSIS — R112 Nausea with vomiting, unspecified: Secondary | ICD-10-CM | POA: Insufficient documentation

## 2016-08-31 DIAGNOSIS — R21 Rash and other nonspecific skin eruption: Secondary | ICD-10-CM | POA: Insufficient documentation

## 2016-08-31 DIAGNOSIS — R1013 Epigastric pain: Secondary | ICD-10-CM | POA: Insufficient documentation

## 2016-08-31 DIAGNOSIS — J029 Acute pharyngitis, unspecified: Secondary | ICD-10-CM | POA: Insufficient documentation

## 2016-08-31 MED ORDER — PREDNISONE 20 MG PO TABS: ORAL_TABLET | ORAL | 0 refills | Status: DC

## 2016-08-31 MED ORDER — DIPHENHYDRAMINE HCL 25 MG PO TABS: 25.0000 mg | ORAL_TABLET | Freq: Every evening | ORAL | 0 refills | Status: DC | PRN

## 2016-08-31 NOTE — Assessment & Plan Note (Signed)
Pruritic urticarial rash ? Med side effect  Plan: Prednisone taper Nightly benadryl

## 2016-08-31 NOTE — Patient Instructions (Addendum)
Diagnoses and all orders for this visit:  Pruritic rash -     predniSONE (DELTASONE) 20 MG tablet; Take every morning with food 60 mg daily for 3 days, 40 mg daily for 3 days, 30 mg daily for 3 days, 20 mg daily for 3 days, 10 mg daily for 3 days then STOP -     diphenhydrAMINE (BENADRYL) 25 MG tablet; Take 1-2 tablets (25-50 mg total) by mouth at bedtime as needed for itching.  Rheumatoid arthritis with positive rheumatoid factor, involving unspecified site [facility])   Dr.  Armen Pickup   F/u in 2 weeks for pruritic rash

## 2016-08-31 NOTE — Progress Notes (Signed)
Pt has bumps on her skin. Pt is also here for a follow up.  Pt is getting flu shot today.

## 2016-08-31 NOTE — Progress Notes (Signed)
Subjective:  Patient ID: Karina Murphy, female    DOB: 1981/03/30  Age: 35 y.o. MRN: [id number]  CC: No chief complaint on file.   HPI Karina Murphy has rheumatoid arthritis she presents for   1. Stomach ache: for 2-3 weeks. Every time she eats she gets epigastric pain, nausea and emesis. Some days symptoms occur with any food. Other days symptoms occur with eating greasy or fatty foods. She has an on her father's side who had her gallbladder removed. She is on her period now. She last vomited 2 nights ago. She has similar symptoms in 2016. She had a normal abdominal ultrasound on 03/2015 except for hepatic steatosis. She also had a normal  EGD on 10/06/2015.  She is not sexually active and currently on her menstrual period. She denies dysuria, heartburn. She has slight sore throat. Feels feverish at home at times.   2. Joint pains: she has rheumatoid arthritis. Diagnosed in 12/2013. She has been out of her medication for 7 months. She was previously treated with methotrexate which upset her stomach and sulfasalazine by Rheumatology at [facility].  She is noticing pain, stiffness and deformity in her joints especially her hands. Current level of joint pain is 7/10. It gets up to 10/10. Her pain is in her hands, wrist feet and both shoulders. Her joint pain has also worsened especially at night.   3. Rash: started one week ago. Patient taking sulfasalazine and diclofenac at time. No other medications. Rash is pruritic especially at night. She has lip tingling but no lip swelling.  Rash on chest, back, arms, legs mons, between thighs. She has stopped diclofenac. She continues to take sulfasalazine for rheumatoid arthritis. She has not taken benadryl.   Social History  Substance Use Topics  . Smoking status: Never Smoker  . Smokeless tobacco: Never Used  . Alcohol use No    Outpatient Medications Prior to Visit  Medication Sig Dispense Refill  . naproxen (NAPROSYN) 500 MG tablet Take  1 tablet (500 mg total) by mouth 2 (two) times daily with a meal. 60 tablet 0  . omeprazole (PRILOSEC) 20 MG capsule Take 1 capsule (20 mg total) by mouth 2 (two) times daily before a meal. 60 capsule 2  . promethazine (PHENERGAN) 12.5 MG tablet Take 1 tablet (12.5 mg total) by mouth every 8 (eight) hours as needed for nausea or vomiting. 30 tablet 2  . sulfaSALAzine (AZULFIDINE) 500 MG tablet Take 500 mg daily for first week, then 500 mg twice daily for week two, then 1000 mg in AM and 500 mg in PM week three, then 1000 mg twice daily 120 tablet 2   No facility-administered medications prior to visit.     ROS Review of Systems  Constitutional: Negative for chills and fever.  Eyes: Negative for visual disturbance.  Respiratory: Negative for shortness of breath.   Cardiovascular: Negative for chest pain.  Gastrointestinal: Positive for abdominal pain, nausea and vomiting. Negative for blood in stool.  Musculoskeletal: Positive for arthralgias and joint swelling. Negative for back pain.  Skin: Positive for rash.  Allergic/Immunologic: Negative for immunocompromised state.  Hematological: Negative for adenopathy. Does not bruise/bleed easily.  Psychiatric/Behavioral: Negative for dysphoric mood and suicidal ideas.    Objective:  BP 115/77 (BP Location: Left Arm, Patient Position: Sitting, Cuff Size: Small)   Pulse 79   Temp 98.8 F (37.1 C) (Oral)   Ht 5\' 3"  (1.6 m)   Wt 181 lb 12.8 oz (82.5 kg)   LMP 08/17/2016  SpO2 100%   BMI 32.20 kg/m   BP/Weight 08/31/2016 07/26/2016 02/25/2016  Systolic BP 115 115 126  Diastolic BP 77 76 84  Wt. (Lbs) 181.8 178.6 186.8  BMI 32.2 31.64 33.1   Physical Exam  Constitutional: She is oriented to person, place, and time. She appears well-developed and well-nourished. No distress.  HENT:  Head: Normocephalic and atraumatic.  Mouth/Throat: Oropharynx is clear and moist and mucous membranes are normal. No oropharyngeal exudate.  Eyes:  Conjunctivae are normal. Pupils are equal, round, and reactive to light.  Cardiovascular: Normal rate, regular rhythm, normal heart sounds and intact distal pulses.   Pulmonary/Chest: Effort normal and breath sounds normal.  Abdominal: Soft. Bowel sounds are normal. She exhibits no distension and no mass. There is no tenderness. There is no rebound and no guarding.  Musculoskeletal: She exhibits no edema.       Hands: Neurological: She is alert and oriented to person, place, and time.  Skin: Skin is warm and dry. No rash noted.     Psychiatric: She has a normal mood and affect.     Assessment & Plan:  Diagnoses and all orders for this visit:  Pruritic rash -     predniSONE (DELTASONE) 20 MG tablet; Take every morning with food 60 mg daily for 3 days, 40 mg daily for 3 days, 30 mg daily for 3 days, 20 mg daily for 3 days, 10 mg daily for 3 days then STOP -     diphenhydrAMINE (BENADRYL) 25 MG tablet; Take 1-2 tablets (25-50 mg total) by mouth at bedtime as needed for itching.  Rheumatoid arthritis with positive rheumatoid factor, involving unspecified site (HCC)  Other orders -     Cancel: Cytology - PAP  There are no diagnoses linked to this encounter.  No orders of the defined types were placed in this encounter.   Follow-up: Return in about 2 weeks (around 09/14/2016) for rash and flu shot .   [name] MD

## 2016-09-17 ENCOUNTER — Ambulatory Visit: Payer: Self-pay | Attending: Family Medicine | Admitting: Family Medicine

## 2016-09-17 ENCOUNTER — Encounter: Payer: Self-pay | Admitting: Family Medicine

## 2016-09-17 VITALS — BP 128/80 | HR 78 | Temp 98.2°F | Ht 63.0 in | Wt 180.6 lb

## 2016-09-17 DIAGNOSIS — B9789 Other viral agents as the cause of diseases classified elsewhere: Secondary | ICD-10-CM

## 2016-09-17 DIAGNOSIS — N644 Mastodynia: Secondary | ICD-10-CM

## 2016-09-17 DIAGNOSIS — J988 Other specified respiratory disorders: Secondary | ICD-10-CM | POA: Insufficient documentation

## 2016-09-17 DIAGNOSIS — R21 Rash and other nonspecific skin eruption: Secondary | ICD-10-CM | POA: Insufficient documentation

## 2016-09-17 DIAGNOSIS — J22 Unspecified acute lower respiratory infection: Secondary | ICD-10-CM | POA: Insufficient documentation

## 2016-09-17 DIAGNOSIS — Z23 Encounter for immunization: Secondary | ICD-10-CM

## 2016-09-17 DIAGNOSIS — M069 Rheumatoid arthritis, unspecified: Secondary | ICD-10-CM | POA: Insufficient documentation

## 2016-09-17 DIAGNOSIS — L282 Other prurigo: Secondary | ICD-10-CM

## 2016-09-17 DIAGNOSIS — M059 Rheumatoid arthritis with rheumatoid factor, unspecified: Secondary | ICD-10-CM

## 2016-09-17 DIAGNOSIS — Z79899 Other long term (current) drug therapy: Secondary | ICD-10-CM | POA: Insufficient documentation

## 2016-09-17 HISTORY — DX: Mastodynia: N64.4

## 2016-09-17 MED ORDER — AMOXICILLIN 500 MG PO CAPS: 500.0000 mg | ORAL_CAPSULE | Freq: Three times a day (TID) | ORAL | 0 refills | Status: DC

## 2016-09-17 NOTE — Assessment & Plan Note (Signed)
Viral URI  Advised Tylenol cold and flu max Take amoxicillin if no improvement by day 10 of illness Viral swab done

## 2016-09-17 NOTE — Progress Notes (Signed)
Subjective:  Patient ID: Karina Murphy, female    DOB: 1981-07-09  Age: 36 y.o. MRN: [id number]  CC: Rash   HPI Karina Murphy has rheumatoid arthritis she presents for   1. Rash: started after taking  Started after taking sulfasalazine and diclofenac at time. No other medications. Rash was pruritic especially at night. She has lip tingling but no lip swelling.  Rash on chest, back, arms, legs mons, between thighs  She has stopped diclofenac and rash persisted. At last OV she was prescribed course of prednisone. She started prednisone and stopped sulfasalazine. Her rash improved. She still has slight itching and rash on arms and legs with no new lesions.   2. Cold symptoms: x 4 days. Sore throat and cough. Feeling sweaty of feverish at night.  Her daughter also has cold symptoms. She is treating at home with tea. S  3. L breast pain: x one week. Left lower breast. No rash or skin changes. No nipple discharge.  No lumps. She has no family history of breast cancer. Her LMP is 09/16/2016. No pain in R breast.   4. Rheumatoid arthritis. Diagnosed in 12/2013. She has been out of her medication for 7 months. She was previously treated with methotrexate which upset her stomach and sulfasalazine by Rheumatology at [facility].  She is noticing pain, stiffness and deformity in her joints especially her hands. Current level of joint pain is 7/10. It gets up to 10/10. Her pain is in her hands, wrist feet and both shoulders. Her joint pain has also worsened especially at night.    Social History  Substance Use Topics  . Smoking status: Never Smoker  . Smokeless tobacco: Never Used  . Alcohol use No    Outpatient Medications Prior to Visit  Medication Sig Dispense Refill  . diphenhydrAMINE (BENADRYL) 25 MG tablet Take 1-2 tablets (25-50 mg total) by mouth at bedtime as needed for itching. 30 tablet 0  . promethazine (PHENERGAN) 12.5 MG tablet Take 1 tablet (12.5 mg total) by mouth every 8  (eight) hours as needed for nausea or vomiting. 30 tablet 2  . omeprazole (PRILOSEC) 20 MG capsule Take 1 capsule (20 mg total) by mouth 2 (two) times daily before a meal. (Patient not taking: Reported on 09/17/2016) 60 capsule 2  . predniSONE (DELTASONE) 20 MG tablet Take every morning with food 60 mg daily for 3 days, 40 mg daily for 3 days, 30 mg daily for 3 days, 20 mg daily for 3 days, 10 mg daily for 3 days then STOP (Patient not taking: Reported on 09/17/2016) 24 tablet 0  . sulfaSALAzine (AZULFIDINE) 500 MG tablet Take 500 mg daily for first week, then 500 mg twice daily for week two, then 1000 mg in AM and 500 mg in PM week three, then 1000 mg twice daily (Patient not taking: Reported on 09/17/2016) 120 tablet 2   No facility-administered medications prior to visit.     ROS Review of Systems  Constitutional: Positive for fever. Negative for chills.  HENT: Positive for congestion, ear pain, sinus pain, sinus pressure and sore throat.   Eyes: Negative for visual disturbance.  Respiratory: Negative for shortness of breath.   Cardiovascular: Negative for chest pain.  Gastrointestinal: Negative for abdominal pain, blood in stool, nausea and vomiting.  Musculoskeletal: Positive for arthralgias and joint swelling. Negative for back pain.  Skin: Positive for rash.  Allergic/Immunologic: Negative for immunocompromised state.  Hematological: Negative for adenopathy. Does not bruise/bleed easily.  Psychiatric/Behavioral: Negative for  dysphoric mood and suicidal ideas.    Objective:  BP 128/80 (BP Location: Left Arm, Patient Position: Sitting, Cuff Size: Small)   Pulse 78   Temp 98.2 F (36.8 C) (Oral)   Ht 5\' 3"  (1.6 m)   Wt 180 lb 9.6 oz (81.9 kg)   LMP 09/16/2016   SpO2 100%   BMI 31.99 kg/m   BP/Weight 09/17/2016 08/31/2016 07/26/2016  Systolic BP 128 115 115  Diastolic BP 80 77 76  Wt. (Lbs) 180.6 181.8 178.6  BMI 31.99 32.2 31.64   Physical Exam  Constitutional: She is oriented  to person, place, and time. She appears well-developed and well-nourished. No distress.  HENT:  Head: Normocephalic and atraumatic.  Mouth/Throat: Oropharynx is clear and moist and mucous membranes are normal. No oropharyngeal exudate.  Eyes: Conjunctivae are normal. Pupils are equal, round, and reactive to light.  Cardiovascular: Normal rate, regular rhythm, normal heart sounds and intact distal pulses.   Pulmonary/Chest: Effort normal and breath sounds normal. Right breast exhibits no inverted nipple, no mass, no nipple discharge, no skin change and no tenderness. Left breast exhibits no inverted nipple, no mass, no nipple discharge, no skin change and no tenderness. Breasts are symmetrical.    Abdominal: Soft. Bowel sounds are normal. She exhibits no distension and no mass. There is no tenderness. There is no rebound and no guarding.  Musculoskeletal: She exhibits no edema.       Hands: Neurological: She is alert and oriented to person, place, and time.  Skin: Skin is warm and dry. No rash noted.     Psychiatric: She has a normal mood and affect.     Assessment & Plan:  Karina Murphy was seen today for rash, uri and breast pain.  Diagnoses and all orders for this visit:  Viral respiratory infection -     amoxicillin (AMOXIL) 500 MG capsule; Take 1 capsule (500 mg total) by mouth 3 (three) times daily. -     Respiratory virus panel  Breast pain, left -     Karina Murphy BREAST LTD UNI LEFT INC AXILLA; Future -     MM DIAG BREAST TOMO BILATERAL; Future  Flu vaccine need -     Flu Vaccine QUAD 36+ mos IM  There are no diagnoses linked to this encounter.  No orders of the defined types were placed in this encounter.   Follow-up: Return in about 6 weeks (around 10/29/2016) for viral URI .   10/31/2016 MD

## 2016-09-17 NOTE — Assessment & Plan Note (Signed)
Improved with prednisone. 

## 2016-09-17 NOTE — Patient Instructions (Signed)
Karina Murphy was seen today for rash, uri and breast pain.  Diagnoses and all orders for this visit:  Viral respiratory infection -     amoxicillin (AMOXIL) 500 MG capsule; Take 1 capsule (500 mg total) by mouth 3 (three) times daily. -     Respiratory virus panel  Breast pain, left -     US BREAST LTD UNI LEFT INC AXILLA; Future  Other orders -     Flu Vaccine QUAD 36+ mos IM    I did not order Tamiflu as it needs to be started within the first 2 days of symptoms to be helpful  You have a viral URI with cough. For this please do the following:  1. Drink plenty of fluids. Hot tea, soup etc will help open your nasal passages. 2. Dextromethorphan 30 mg every 6-8 hrs (plain Robitussin) for cough suppression 3. Tylenol for pain up to 1000 mg three times daily.  4. Nasal saline-OTC nose spray for congestion.   Tylenol severe cold and flu syrup daytime and nighttime is very helpful.  If you are not feeling better by day 10 of illness start amoxicillin, sent to pharmacy.   Seek medical attention for chest pain, shortness of breath, persistent high fever.  F/u in 4 weeks for viral URI, sooner if needed   Dr. Armen Pickup

## 2016-09-17 NOTE — Assessment & Plan Note (Signed)
Diagnostic mammogram and breast ultrasound for asymmetrical breast pain

## 2016-09-17 NOTE — Progress Notes (Signed)
Pt is here today to follow up on rash and to get flu shot.

## 2016-09-17 NOTE — Assessment & Plan Note (Signed)
Restart sulfasalazine Monitor for rash

## 2016-09-18 LAB — RESPIRATORY VIRUS PANEL
Adenovirus B: NOT DETECTED
INFLUENZA A H1: NOT DETECTED
INFLUENZA A: NOT DETECTED
INFLUENZA B 1: NOT DETECTED
Influenza A H3: NOT DETECTED
METAPNEUMOVIRUS: NOT DETECTED
PARAINFLUENZA 2 A: NOT DETECTED
PARAINFLUENZA 3 A: NOT DETECTED
Parainfluenza 1: NOT DETECTED
RHINOVIRUS: NOT DETECTED
Respiratory Syncytial Virus A: NOT DETECTED
Respiratory Syncytial Virus B: NOT DETECTED

## 2016-09-22 ENCOUNTER — Telehealth: Payer: Self-pay

## 2016-09-22 NOTE — Telephone Encounter (Signed)
Pt. Returned nurse call and was informed of results being negative.

## 2016-09-22 NOTE — Telephone Encounter (Signed)
Pt was called and a VM was left informing pt to return phone call for lab results. If pt calls back please inform her of:  Negative viral screen

## 2016-09-25 ENCOUNTER — Ambulatory Visit (HOSPITAL_COMMUNITY)
Admission: EM | Admit: 2016-09-25 | Discharge: 2016-09-25 | Disposition: A | Discharge status: Home / Self Care | Payer: Self-pay | Attending: Internal Medicine | Admitting: Internal Medicine

## 2016-09-25 ENCOUNTER — Encounter (HOSPITAL_COMMUNITY): Payer: Self-pay | Admitting: Family Medicine

## 2016-09-25 DIAGNOSIS — L509 Urticaria, unspecified: Secondary | ICD-10-CM

## 2016-09-25 DIAGNOSIS — T7840XA Allergy, unspecified, initial encounter: Secondary | ICD-10-CM

## 2016-09-25 MED ORDER — METHYLPREDNISOLONE ACETATE 80 MG/ML IJ SUSP
INTRAMUSCULAR | Status: AC
  Filled 2016-09-25: qty 1

## 2016-09-25 MED ORDER — METHYLPREDNISOLONE ACETATE 80 MG/ML IJ SUSP
80.0000 mg | Freq: Once | INTRAMUSCULAR | Status: AC
  Administered 2016-09-25: 80 mg via INTRAMUSCULAR

## 2016-09-25 NOTE — Discharge Instructions (Signed)
You seem to be having an allergic reaction. Continue to take Benadryl 25-50mg  every 4-6 hours. Only take 50mg  if you are not going to be driving. I am giving you a steroid shot which will help the itching. Avoid hot showers or baths as this may irritate the rash. FU in the ED if for some reason you become worse.

## 2016-09-25 NOTE — ED Provider Notes (Signed)
CSN: [id number]     Arrival date & time 09/25/16  1215 History   First MD Initiated Contact with Patient 09/25/16 1222     Chief Complaint  Patient presents with  . Allergic Reaction   (Consider location/radiation/quality/duration/timing/severity/associated sxs/prior Treatment) 36 yo presents with hives x 3 days. She reports even "itching" in her throat and feels as though it is swollen for the last 3 days. She was evaluated for URI 2 weeks ago and given Amox, but did NOT take it. She improved and therefore did not take. She denies any new medications or food. She had one 25mg  Benadryl last night which helped some. She denies CP or dyspnea. No wheezing.        Past Medical History:  Diagnosis Date  . Anxiety   . Arthritis, rheumatoid (HCC)   . Chronic headaches   . Hypertension    Past Surgical History:  Procedure Laterality Date  . NO PAST SURGERIES     Family History  Problem Relation Age of Onset  . Diabetes Mother   . Hypertension Mother   . Stomach cancer Neg Hx   . Colon cancer Neg Hx    Social History  Substance Use Topics  . Smoking status: Never Smoker  . Smokeless tobacco: Never Used  . Alcohol use No   OB History    No data available     Review of Systems  HENT: Negative for drooling.   Eyes: Negative for itching.  Respiratory: Negative for apnea, cough, choking, chest tightness, shortness of breath and wheezing.   Skin: Positive for rash.  Allergic/Immunologic: Negative.     Allergies  Eggs or egg-derived products  Home Medications   Prior to Admission medications   Medication Sig Start Date End Date Taking? Authorizing Provider  amoxicillin (AMOXIL) 500 MG capsule Take 1 capsule (500 mg total) by mouth 3 (three) times daily. 09/17/16   Josalyn Funches, MD  diphenhydrAMINE (BENADRYL) 25 MG tablet Take 1-2 tablets (25-50 mg total) by mouth at bedtime as needed for itching. 08/31/16   Josalyn Funches, MD  omeprazole (PRILOSEC) 20 MG capsule Take 1  capsule (20 mg total) by mouth 2 (two) times daily before a meal. Patient not taking: Reported on 09/17/2016 07/26/16   07/28/16, MD  promethazine (PHENERGAN) 12.5 MG tablet Take 1 tablet (12.5 mg total) by mouth every 8 (eight) hours as needed for nausea or vomiting. 07/26/16   07/28/16, MD  sulfaSALAzine (AZULFIDINE) 500 MG tablet Take 500 mg daily for first week, then 500 mg twice daily for week two, then 1000 mg in AM and 500 mg in PM week three, then 1000 mg twice daily Patient not taking: Reported on 09/17/2016 07/26/16   07/28/16, MD   Meds Ordered and Administered this Visit   Medications  methylPREDNISolone acetate (DEPO-MEDROL) injection 80 mg (not administered)    BP 129/83   Pulse 79   Temp 97.8 F (36.6 C)   Resp 18   LMP 09/16/2016   SpO2 100%  No data found.   Physical Exam  Constitutional: She is oriented to person, place, and time. She appears well-developed and well-nourished. No distress.  HENT:  Head: Normocephalic and atraumatic.  Mouth/Throat: Oropharynx is clear and moist.  No swelling is noted in the oropharynx, uvula is normal  Neck: No tracheal deviation present.  Cardiovascular: Normal rate and regular rhythm.   Pulmonary/Chest: Effort normal and breath sounds normal. No stridor.  Neurological: She is alert and oriented to  person, place, and time.  Skin: Skin is warm and dry. She is not diaphoretic.  Widespread urticaria  Psychiatric: Her behavior is normal.  Nursing note and vitals reviewed.   Urgent Care Course   Clinical Course     Procedures (including critical care time)  Labs Review Labs Reviewed - No data to display  Imaging Review No results found.   Visual Acuity Review  Right Eye Distance:   Left Eye Distance:   Bilateral Distance:    Right Eye Near:   Left Eye Near:    Bilateral Near:         MDM   1. Allergic reaction, initial encounter   2. Urticaria    Patient is stable and in NAD.  Urticaria by exam without anaphylaxis. Treat with Benadryl 25-50 mg every 4-6 hours for itching and reaction. Instructions to use only 50mg  if NOT driving. DepoMedrol 80mg  IM given here. If worsens then please f/u in the ED.     , PA-C 09/25/16 1238

## 2016-09-25 NOTE — ED Triage Notes (Signed)
Pt here for allergic reaction. sts itching all over and throat scratchy and trouble swallowing. sts unsure of what she ate. sts no new meds.

## 2016-11-05 ENCOUNTER — Encounter: Payer: Self-pay | Admitting: Family Medicine

## 2016-11-05 ENCOUNTER — Ambulatory Visit: Payer: Self-pay | Attending: Family Medicine | Admitting: Family Medicine

## 2016-11-05 VITALS — BP 112/68 | HR 71 | Temp 98.9°F | Ht 63.0 in | Wt 179.4 lb

## 2016-11-05 DIAGNOSIS — R1013 Epigastric pain: Secondary | ICD-10-CM | POA: Insufficient documentation

## 2016-11-05 DIAGNOSIS — L282 Other prurigo: Secondary | ICD-10-CM

## 2016-11-05 DIAGNOSIS — J22 Unspecified acute lower respiratory infection: Secondary | ICD-10-CM | POA: Insufficient documentation

## 2016-11-05 DIAGNOSIS — Z79899 Other long term (current) drug therapy: Secondary | ICD-10-CM | POA: Insufficient documentation

## 2016-11-05 DIAGNOSIS — M059 Rheumatoid arthritis with rheumatoid factor, unspecified: Secondary | ICD-10-CM

## 2016-11-05 DIAGNOSIS — L509 Urticaria, unspecified: Secondary | ICD-10-CM | POA: Insufficient documentation

## 2016-11-05 DIAGNOSIS — M069 Rheumatoid arthritis, unspecified: Secondary | ICD-10-CM | POA: Insufficient documentation

## 2016-11-05 MED ORDER — PREDNISONE 20 MG PO TABS: 20.0000 mg | ORAL_TABLET | Freq: Every day | ORAL | 0 refills | Status: DC

## 2016-11-05 MED ORDER — TRIAMCINOLONE ACETONIDE 0.025 % EX CREA: 1.0000 "application " | TOPICAL_CREAM | Freq: Two times a day (BID) | CUTANEOUS | 0 refills | Status: DC

## 2016-11-05 MED ORDER — AZITHROMYCIN 250 MG PO TABS: ORAL_TABLET | ORAL | 0 refills | Status: DC

## 2016-11-05 MED ORDER — OMEPRAZOLE 20 MG PO CPDR: 20.0000 mg | DELAYED_RELEASE_CAPSULE | Freq: Two times a day (BID) | ORAL | 2 refills | Status: DC

## 2016-11-05 MED FILL — predniSONE 20 MG TABS: 20 | 30 days supply | Qty: 30 | Fill #0

## 2016-11-05 MED FILL — TRIAMCINOLONE 0.025% CREAM: 0.025 | 30 days supply | Qty: 30 | Fill #0

## 2016-11-05 MED FILL — AZITHROMYCIN 250 MG TABLET: 250 | 5 days supply | Qty: 6 | Fill #0

## 2016-11-05 NOTE — Assessment & Plan Note (Addendum)
Normal exam z-pack for suspect community acquired pneumonia

## 2016-11-05 NOTE — Patient Instructions (Signed)
Karina Murphy was seen today for uri.  Diagnoses and all orders for this visit:  Rheumatoid arthritis with positive rheumatoid factor, involving unspecified site [facility]) -     Ambulatory referral to Rheumatology -     predniSONE (DELTASONE) 20 MG tablet; Take 1 tablet (20 mg total) by mouth daily with breakfast. -     omeprazole (PRILOSEC) 20 MG capsule; Take 1 capsule (20 mg total) by mouth 2 (two) times daily before a meal.  Abdominal pain, epigastric -     omeprazole (PRILOSEC) 20 MG capsule; Take 1 capsule (20 mg total) by mouth 2 (two) times daily before a meal.  Pruritic rash -     triamcinolone (KENALOG) 0.025 % cream; Apply 1 application topically 2 (two) times daily.  Lower respiratory infection -     azithromycin (ZITHROMAX) 250 MG tablet; Take 500 mg once daily, then 250 mg daily for 4 days   F/u in 2 weeks for rheumatoid arthritis, rash, and blood sugar check  Dr. Armen Pickup

## 2016-11-05 NOTE — Assessment & Plan Note (Signed)
RA Rash with sulfasalazine Stop sulfasalazine Prednisone 20 mg daily with plan to titrate to lowest effective dose Prilosec Close f/u for CBG check Referral to rheumatology

## 2016-11-05 NOTE — Progress Notes (Signed)
Subjective:  Patient ID: Karina Murphy, female    DOB: [birth year]  Age: 36 y.o. MRN: [id number]  CC: URI   HPI Karina Murphy has rheumatoid arthritis she presents for   1. Rash: started after taking sulfasalazine and diclofenac at the same time. No other medications. Rash was pruritic especially at night. She has lip tingling but no lip swelling.  Rash on chest, back, arms, legs mons, between thighs.  She has stopped diclofenac and rash persisted. She was prescribed a course of prednisone. She started prednisone and stopped sulfasalazine. Her rash improved but she  still had slight itching and rash on arms and legs with no new lesions. She went to urgent care  on 09/25/2016 for rash. She was treated with DepoMedrol 80 mg IM and advised to take benadryl for urticaria.   She continues to take sulfasalazine. She continues to have itching and ras especially on her legs.   2. Rheumatoid arthritis. Diagnosed in 12/2013. She has been out of her medication for 7 months. She was previously treated with methotrexate which upset her stomach and sulfasalazine by Rheumatology at [facility].  She is noticing pain, stiffness and deformity in her joints especially her hands. Current level of joint pain is 7/10. It gets up to 10/10. Her pain is in her hands, wrist feet and both shoulders. Her joint pain has also worsened especially at night.   2. Cold symptoms: x started on 09/13/2016.  Sore throat and cough. She has pain in her left lower chest. No fever chills. She did not take prescribed amoxicillin.    Social History  Substance Use Topics  . Smoking status: Never Smoker  . Smokeless tobacco: Never Used  . Alcohol use No    Outpatient Medications Prior to Visit  Medication Sig Dispense Refill  . amoxicillin (AMOXIL) 500 MG capsule Take 1 capsule (500 mg total) by mouth 3 (three) times daily. 30 capsule 0  . diphenhydrAMINE (BENADRYL) 25 MG tablet Take 1-2 tablets (25-50 mg total) by mouth  at bedtime as needed for itching. 30 tablet 0  . omeprazole (PRILOSEC) 20 MG capsule Take 1 capsule (20 mg total) by mouth 2 (two) times daily before a meal. (Patient not taking: Reported on 09/17/2016) 60 capsule 2  . promethazine (PHENERGAN) 12.5 MG tablet Take 1 tablet (12.5 mg total) by mouth every 8 (eight) hours as needed for nausea or vomiting. 30 tablet 2  . sulfaSALAzine (AZULFIDINE) 500 MG tablet Take 500 mg daily for first week, then 500 mg twice daily for week two, then 1000 mg in AM and 500 mg in PM week three, then 1000 mg twice daily (Patient not taking: Reported on 09/17/2016) 120 tablet 2   No facility-administered medications prior to visit.     ROS Review of Systems  Constitutional: Negative for chills.  HENT: Positive for sore throat.   Eyes: Negative for visual disturbance.  Respiratory: Positive for wheezing. Negative for shortness of breath.   Cardiovascular: Positive for chest pain.  Gastrointestinal: Negative for abdominal pain, blood in stool, nausea and vomiting.  Musculoskeletal: Positive for arthralgias and joint swelling. Negative for back pain.  Skin: Positive for rash.  Allergic/Immunologic: Negative for immunocompromised state.  Hematological: Negative for adenopathy. Does not bruise/bleed easily.  Psychiatric/Behavioral: Negative for dysphoric mood and suicidal ideas.    Objective:  BP 112/68 (BP Location: Left Arm, Patient Position: Sitting, Cuff Size: Small)   Pulse 71   Temp 98.9 F (37.2 C) (Oral)   Ht 5\' 3"  (  1.6 m)   Wt 179 lb 6.4 oz (81.4 kg)   LMP 10/19/2016   SpO2 100%   BMI 31.78 kg/m   BP/Weight 11/05/2016 09/25/2016 09/17/2016  Systolic BP 112 129 128  Diastolic BP 68 83 80  Wt. (Lbs) 179.4 - 180.6  BMI 31.78 - 31.99   Physical Exam  Constitutional: She is oriented to person, place, and time. She appears well-developed and well-nourished. No distress.  HENT:  Head: Normocephalic and atraumatic.  Mouth/Throat: Oropharynx is clear and  moist and mucous membranes are normal. No oropharyngeal exudate.  Eyes: Conjunctivae are normal. Pupils are equal, round, and reactive to light.  Cardiovascular: Normal rate, regular rhythm, normal heart sounds and intact distal pulses.   Pulmonary/Chest: Effort normal and breath sounds normal. Right breast exhibits no inverted nipple, no mass, no nipple discharge, no skin change and no tenderness. Left breast exhibits no inverted nipple, no mass, no nipple discharge, no skin change and no tenderness. Breasts are symmetrical.  Abdominal: Soft. Bowel sounds are normal. She exhibits no distension and no mass. There is no tenderness. There is no rebound and no guarding.  Musculoskeletal: She exhibits no edema.       Hands: Neurological: She is alert and oriented to person, place, and time.  Skin: Skin is warm and dry. No rash noted.     Psychiatric: She has a normal mood and affect.     Assessment & Plan:  Karina Murphy was seen today for uri.  Diagnoses and all orders for this visit:  Rheumatoid arthritis with positive rheumatoid factor, involving unspecified site [facility]) -     Ambulatory referral to Rheumatology -     predniSONE (DELTASONE) 20 MG tablet; Take 1 tablet (20 mg total) by mouth daily with breakfast. -     omeprazole (PRILOSEC) 20 MG capsule; Take 1 capsule (20 mg total) by mouth 2 (two) times daily before a meal.  Abdominal pain, epigastric -     omeprazole (PRILOSEC) 20 MG capsule; Take 1 capsule (20 mg total) by mouth 2 (two) times daily before a meal.  Pruritic rash -     triamcinolone (KENALOG) 0.025 % cream; Apply 1 application topically 2 (two) times daily.  Lower respiratory infection -     azithromycin (ZITHROMAX) 250 MG tablet; Take 500 mg once daily, then 250 mg daily for 4 days  There are no diagnoses linked to this encounter.  No orders of the defined types were placed in this encounter.   Follow-up: Return in about 2 weeks (around 11/19/2016).   [name]  MD

## 2016-11-19 ENCOUNTER — Ambulatory Visit: Payer: Self-pay | Attending: Family Medicine | Admitting: Family Medicine

## 2016-11-19 VITALS — BP 118/76 | HR 74 | Temp 98.3°F | Ht 63.0 in | Wt 177.4 lb

## 2016-11-19 DIAGNOSIS — M069 Rheumatoid arthritis, unspecified: Secondary | ICD-10-CM | POA: Insufficient documentation

## 2016-11-19 DIAGNOSIS — R1013 Epigastric pain: Secondary | ICD-10-CM | POA: Insufficient documentation

## 2016-11-19 DIAGNOSIS — M059 Rheumatoid arthritis with rheumatoid factor, unspecified: Secondary | ICD-10-CM

## 2016-11-19 DIAGNOSIS — Z79899 Other long term (current) drug therapy: Secondary | ICD-10-CM | POA: Insufficient documentation

## 2016-11-19 DIAGNOSIS — R531 Weakness: Secondary | ICD-10-CM | POA: Insufficient documentation

## 2016-11-19 LAB — POCT URINALYSIS DIPSTICK
Bilirubin, UA: NEGATIVE
Glucose, UA: NEGATIVE
Leukocytes, UA: NEGATIVE
Nitrite, UA: NEGATIVE
PROTEIN UA: 30
RBC UA: NEGATIVE
SPEC GRAV UA: 1.015
Urobilinogen, UA: 1
pH, UA: 7.5

## 2016-11-19 LAB — COMPLETE METABOLIC PANEL WITH GFR
ALT: 8 U/L (ref 6–29)
AST: 9 U/L — ABNORMAL LOW (ref 10–30)
Albumin: 4.2 g/dL (ref 3.6–5.1)
Alkaline Phosphatase: 60 U/L (ref 33–115)
BILIRUBIN TOTAL: 0.8 mg/dL (ref 0.2–1.2)
BUN: 12 mg/dL (ref 7–25)
CO2: 28 mmol/L (ref 20–31)
CREATININE: 0.81 mg/dL (ref 0.50–1.10)
Calcium: 9.7 mg/dL (ref 8.6–10.2)
Chloride: 98 mmol/L (ref 98–110)
GFR, Est African American: >89 mL/min (ref >=60)
GFR, Est Non African American: >89 mL/min (ref >=60)
GLUCOSE: 85 mg/dL (ref 65–99)
Potassium: 4.5 mmol/L (ref 3.5–5.3)
SODIUM: 137 mmol/L (ref 135–146)
TOTAL PROTEIN: 7.5 g/dL (ref 6.1–8.1)

## 2016-11-19 LAB — IRON AND TIBC
%SAT: 13 % (ref 11–50)
IRON: 38 ug/dL — AB (ref 40–190)
TIBC: 291 ug/dL (ref 250–450)
UIBC: 253 ug/dL (ref 125–400)

## 2016-11-19 LAB — CBC
HCT: 39 % (ref 35.0–45.0)
HEMOGLOBIN: 12.5 g/dL (ref 11.7–15.5)
MCH: 27.7 pg (ref 27.0–33.0)
MCHC: 32.1 g/dL (ref 32.0–36.0)
MCV: 86.5 fL (ref 80.0–100.0)
MPV: 9.8 fL (ref 7.5–12.5)
Platelets: 412 10*3/uL — ABNORMAL HIGH (ref 140–400)
RBC: 4.51 MIL/uL (ref 3.80–5.10)
RDW: 15.3 % — ABNORMAL HIGH (ref 11.0–15.0)
WBC: 12.7 10*3/uL — ABNORMAL HIGH (ref 3.8–10.8)

## 2016-11-19 LAB — POCT URINE PREGNANCY: Preg Test, Ur: NEGATIVE

## 2016-11-19 LAB — FERRITIN: FERRITIN: 82 ng/mL (ref 10–154)

## 2016-11-19 MED ORDER — PROMETHAZINE HCL 25 MG PO TABS: 25.0000 mg | ORAL_TABLET | Freq: Three times a day (TID) | ORAL | 0 refills | Status: DC | PRN

## 2016-11-19 MED ORDER — PREDNISONE 10 MG PO TABS: 10.0000 mg | ORAL_TABLET | Freq: Every day | ORAL | 2 refills | Status: DC

## 2016-11-19 MED ORDER — GI COCKTAIL ~~LOC~~: 30.0000 mL | Freq: Once | ORAL | Status: DC

## 2016-11-19 MED ORDER — FERROUS SULFATE 325 (65 FE) MG PO TABS: 325.0000 mg | ORAL_TABLET | Freq: Two times a day (BID) | ORAL | 3 refills | Status: DC

## 2016-11-19 MED ORDER — SUCRALFATE 1 G PO TABS: 1.0000 g | ORAL_TABLET | Freq: Three times a day (TID) | ORAL | 0 refills | Status: DC

## 2016-11-19 MED ORDER — ONDANSETRON 8 MG PO TBDP
8.0000 mg | ORAL_TABLET | Freq: Once | ORAL | Status: AC
  Administered 2016-11-19: 8 mg via ORAL

## 2016-11-19 MED FILL — FERROUS SULFATE 325 MG TAB: 325 (65 FE) | 30 days supply | Qty: 60 | Fill #0

## 2016-11-19 MED FILL — PROMETHAZINE 25 MG TABLET: 25 | 7 days supply | Qty: 20 | Fill #0

## 2016-11-19 MED FILL — SUCRALFATE 1 GM TABLET: 1 | 5 days supply | Qty: 20 | Fill #0

## 2016-11-19 NOTE — Patient Instructions (Addendum)
Karina Murphy was seen today for follow-up.  Diagnoses and all orders for this visit:  Abdominal pain, epigastric -     POCT urinalysis dipstick -     POCT urine pregnancy -     ondansetron (ZOFRAN-ODT) disintegrating tablet 8 mg; Take 1 tablet (8 mg total) by mouth once. -     sucralfate (CARAFATE) 1 g tablet; Take 1 tablet (1 g total) by mouth 4 (four) times daily -  with meals and at bedtime. -     promethazine (PHENERGAN) 25 MG tablet; Take 1 tablet (25 mg total) by mouth every 8 (eight) hours as needed for nausea or vomiting.  Rheumatoid arthritis with positive rheumatoid factor, involving unspecified site (HCC) -     predniSONE (DELTASONE) 10 MG tablet; Take 1 tablet (10 mg total) by mouth daily with breakfast.  Weakness -     CBC -     Iron and TIBC -     Ferritin   Stop prednisone for next 7 days then go back to 10 mg daily Increase prilosec to 20 mg twice daily Phenergan for nausea carafate for pain   F/u next week for abdominal pain, ok to put in 15 minute spot if 30 minute not available   Dr. Armen Pickup

## 2016-11-19 NOTE — Progress Notes (Signed)
Pt is here today for a follow up. Pt states that she is having pain in her abdomen and is vomiting.

## 2016-11-19 NOTE — Progress Notes (Signed)
Subjective:  Patient ID: Karina Murphy, female    DOB: June 21, 1981  Age: 36 y.o. MRN: [id number]  CC: Follow-up   HPI Karina Murphy has rheumatoid arthritis she presents for   1. Rash: resolved with prednisone.   2. Rheumatoid arthritis. Diagnosed in 12/2013. She has been out of her medication for 7 months. She was previously treated with methotrexate which upset her stomach and sulfasalazine by Rheumatology at [facility].  She is noticing pain, stiffness and deformity in her joints especially her hands. Her pain is in her hands, wrist feet and both shoulders. Her joint pain is worse especially at night. The prednisone has improved her pani.   3. Abdominal pain: started yesterday. Developed nausea and emesis today. She vomited 7 times. Epigastric pain is severe. No blood in stool or emesis. She is taking prednisone 20 mg daily. She is take Prilosec 20 mg once daily. She tried Zofran at home for nausea without improvement. She is not sexually active. She reports one week of feeling weak and dizzy.    Social History  Substance Use Topics  . Smoking status: Never Smoker  . Smokeless tobacco: Never Used  . Alcohol use No    Outpatient Medications Prior to Visit  Medication Sig Dispense Refill  . naproxen (NAPROSYN) 500 MG tablet   0  . omeprazole (PRILOSEC) 20 MG capsule Take 1 capsule (20 mg total) by mouth 2 (two) times daily before a meal. 60 capsule 2  . predniSONE (DELTASONE) 20 MG tablet Take 1 tablet (20 mg total) by mouth daily with breakfast. 30 tablet 0  . triamcinolone (KENALOG) 0.025 % cream Apply 1 application topically 2 (two) times daily. 30 g 0  . azithromycin (ZITHROMAX) 250 MG tablet Take 500 mg once daily, then 250 mg daily for 4 days (Patient not taking: Reported on 11/19/2016) 6 tablet 0  . diphenhydrAMINE (BENADRYL) 25 MG tablet Take 1-2 tablets (25-50 mg total) by mouth at bedtime as needed for itching. (Patient not taking: Reported on 11/05/2016) 30 tablet  0   No facility-administered medications prior to visit.     ROS Review of Systems  Constitutional: Negative for chills.  HENT: Positive for sore throat.   Eyes: Negative for visual disturbance.  Respiratory: Positive for wheezing. Negative for shortness of breath.   Cardiovascular: Positive for chest pain.  Gastrointestinal: Positive for abdominal pain, nausea and vomiting. Negative for blood in stool.  Musculoskeletal: Positive for arthralgias and joint swelling. Negative for back pain.  Skin: Negative for rash.  Allergic/Immunologic: Negative for immunocompromised state.  Hematological: Negative for adenopathy. Does not bruise/bleed easily.  Psychiatric/Behavioral: Negative for dysphoric mood and suicidal ideas.    Objective:  BP 118/76 (BP Location: Left Arm, Patient Position: Sitting, Cuff Size: Small)   Pulse 74   Temp 98.3 F (36.8 C) (Oral)   Ht 5\' 3"  (1.6 m)   Wt 177 lb 6.4 oz (80.5 kg)   SpO2 100%   BMI 31.42 kg/m   BP/Weight 11/19/2016 11/05/2016 09/25/2016  Systolic BP 118 112 129  Diastolic BP 76 68 83  Wt. (Lbs) 177.4 179.4 -  BMI 31.42 31.78 -   Physical Exam  Constitutional: She is oriented to person, place, and time. She appears well-developed and well-nourished. No distress.  HENT:  Head: Normocephalic and atraumatic.  Mouth/Throat: Oropharynx is clear and moist and mucous membranes are normal. No oropharyngeal exudate.  Eyes: Conjunctivae are normal. Pupils are equal, round, and reactive to light.  Cardiovascular: Normal rate, regular rhythm,  normal heart sounds and intact distal pulses.   Pulmonary/Chest: Effort normal and breath sounds normal. Right breast exhibits no inverted nipple, no mass, no nipple discharge, no skin change and no tenderness. Left breast exhibits no inverted nipple, no mass, no nipple discharge, no skin change and no tenderness. Breasts are symmetrical.  Abdominal: Soft. Bowel sounds are normal. She exhibits no distension and no  mass. There is no tenderness. There is no rebound and no guarding.  Musculoskeletal: She exhibits no edema.       Hands: Neurological: She is alert and oriented to person, place, and time.  Skin: Skin is warm and dry. No rash noted.  Psychiatric: She has a normal mood and affect.  U preg: negative UA: trace ketone, otherwise normal   Assessment & Plan:  Karina Murphy was seen today for follow-up.  Diagnoses and all orders for this visit:  Abdominal pain, epigastric -     POCT urinalysis dipstick -     POCT urine pregnancy -     ondansetron (ZOFRAN-ODT) disintegrating tablet 8 mg; Take 1 tablet (8 mg total) by mouth once. -     sucralfate (CARAFATE) 1 g tablet; Take 1 tablet (1 g total) by mouth 4 (four) times daily -  with meals and at bedtime. -     promethazine (PHENERGAN) 25 MG tablet; Take 1 tablet (25 mg total) by mouth every 8 (eight) hours as needed for nausea or vomiting. -     COMPLETE METABOLIC PANEL WITH GFR -     Lipase -     gi cocktail (Maalox,Lidocaine,Donnatal); Take 30 mLs by mouth once.  Rheumatoid arthritis with positive rheumatoid factor, involving unspecified site (HCC) -     predniSONE (DELTASONE) 10 MG tablet; Take 1 tablet (10 mg total) by mouth daily with breakfast.  Weakness -     CBC -     Iron and TIBC -     Ferritin -     ferrous sulfate 325 (65 FE) MG tablet; Take 1 tablet (325 mg total) by mouth 2 (two) times daily with a meal.  There are no diagnoses linked to this encounter.  No orders of the defined types were placed in this encounter.   Follow-up: Return in about 1 week (around 11/26/2016) for abdominal pain .   [name] MD

## 2016-11-20 LAB — LIPASE: Lipase: 5 U/L — ABNORMAL LOW (ref 7–60)

## 2016-11-23 ENCOUNTER — Encounter: Payer: Self-pay | Admitting: Family Medicine

## 2016-11-23 DIAGNOSIS — R531 Weakness: Secondary | ICD-10-CM | POA: Insufficient documentation

## 2016-11-23 HISTORY — DX: Weakness: R53.1

## 2016-11-23 NOTE — Assessment & Plan Note (Signed)
Weakness in setting of abdominal pain CBC, iron studies Start ferrous sulfate 325 mg BID

## 2016-11-23 NOTE — Assessment & Plan Note (Signed)
Epigastric pain that is severe in setting of daily prednisone for rheumatoid arthritis Plan: Stop prednisone for one week, then reduce to 10 mg daily  increase Prilosec to 20 mg BID Carafate and Zofran for pain and nausea CMP, CBC

## 2016-11-24 ENCOUNTER — Telehealth: Payer: Self-pay

## 2016-11-24 NOTE — Telephone Encounter (Signed)
Pt was called and informed of lab results. Karina Murphy [id number] 

## 2016-12-06 ENCOUNTER — Ambulatory Visit: Payer: Self-pay | Attending: Family Medicine | Admitting: Family Medicine

## 2016-12-06 VITALS — BP 106/70 | HR 79 | Temp 98.5°F | Resp 18 | Ht 63.0 in | Wt 183.2 lb

## 2016-12-06 DIAGNOSIS — Z79899 Other long term (current) drug therapy: Secondary | ICD-10-CM | POA: Insufficient documentation

## 2016-12-06 DIAGNOSIS — B9789 Other viral agents as the cause of diseases classified elsewhere: Secondary | ICD-10-CM | POA: Insufficient documentation

## 2016-12-06 DIAGNOSIS — R1031 Right lower quadrant pain: Secondary | ICD-10-CM | POA: Insufficient documentation

## 2016-12-06 DIAGNOSIS — J029 Acute pharyngitis, unspecified: Secondary | ICD-10-CM

## 2016-12-06 DIAGNOSIS — L508 Other urticaria: Secondary | ICD-10-CM

## 2016-12-06 DIAGNOSIS — L509 Urticaria, unspecified: Secondary | ICD-10-CM | POA: Insufficient documentation

## 2016-12-06 DIAGNOSIS — J028 Acute pharyngitis due to other specified organisms: Secondary | ICD-10-CM | POA: Insufficient documentation

## 2016-12-06 DIAGNOSIS — R790 Abnormal level of blood mineral: Secondary | ICD-10-CM

## 2016-12-06 DIAGNOSIS — K219 Gastro-esophageal reflux disease without esophagitis: Secondary | ICD-10-CM | POA: Insufficient documentation

## 2016-12-06 DIAGNOSIS — R112 Nausea with vomiting, unspecified: Secondary | ICD-10-CM | POA: Insufficient documentation

## 2016-12-06 LAB — POCT URINALYSIS DIPSTICK
Bilirubin, UA: NEGATIVE
Glucose, UA: NEGATIVE
KETONES UA: NEGATIVE
LEUKOCYTES UA: NEGATIVE
NITRITE UA: NEGATIVE
PH UA: 8 (ref 5.0–8.0)
PROTEIN UA: NEGATIVE
Spec Grav, UA: 1.015 (ref 1.030–1.035)
Urobilinogen, UA: 1 (ref ?–2.0)

## 2016-12-06 LAB — POCT RAPID STREP A (OFFICE): Rapid Strep A Screen: NEGATIVE

## 2016-12-06 MED ORDER — FEXOFENADINE HCL 180 MG PO TABS: 180.0000 mg | ORAL_TABLET | Freq: Every day | ORAL | 6 refills | Status: DC

## 2016-12-06 MED ORDER — FERROUS SULFATE 325 (65 FE) MG PO TABS: 325.0000 mg | ORAL_TABLET | Freq: Every day | ORAL | 2 refills | Status: DC

## 2016-12-06 MED ORDER — TRIAMCINOLONE ACETONIDE 0.025 % EX CREA: 1.0000 "application " | TOPICAL_CREAM | Freq: Two times a day (BID) | CUTANEOUS | 0 refills | Status: DC

## 2016-12-06 MED ORDER — METOCLOPRAMIDE HCL 10 MG PO TABS
10.0000 mg | ORAL_TABLET | Freq: Three times a day (TID) | ORAL | 1 refills | Status: DC
Start: 2016-12-06 — End: 2017-01-12

## 2016-12-06 NOTE — Patient Instructions (Signed)
Opciones de alimentos para pacientes con reflujo gastroesofgico - Adultos (Food Choices for Gastroesophageal Reflux Disease, Adult) Cuando se tiene reflujo gastroesofgico (ERGE), los alimentos que se ingieren y los hbitos de alimentacin son muy importantes. Elegir los alimentos adecuados puede ayudar a aliviar las molestias ocasionadas por el ERGE. QU PAUTAS GENERALES DEBO SEGUIR?  Elija las frutas, los vegetales, los cereales integrales, los productos lcteos, la carne de vaca, de pescado y de ave con bajo contenido de grasas.  Limite las grasas, como los aceites, los aderezos para ensalada, la manteca, los frutos secos y el aguacate.  Lleve un registro de las comidas para identificar los alimentos que ocasionan sntomas.  Evite los alimentos que le ocasionen reflujo. Pueden ser distintos para cada persona.  Haga comidas pequeas con frecuencia en lugar de tres comidas abundantes todos los das.  Coma lentamente, en un clima distendido.  Limite el consumo de alimentos fritos.  Cocine los alimentos utilizando mtodos que no sean la fritura.  Evite el consumo alcohol.  Evite beber grandes cantidades de lquidos con las comidas.  Evite agacharse o recostarse hasta despus de 2 o 3horas de haber comido.  QU ALIMENTOS NO SE RECOMIENDAN? Los siguientes son algunos alimentos y bebidas que pueden empeorar los sntomas: Vegetales Tomates. Jugo de tomate. Salsa de tomate y espagueti. Ajes. Cebolla y ajo. Rbano picante. Frutas Naranjas, pomelos y limn (fruta y jugo). Carnes Carnes de vaca, de pescado y de ave con gran contenido de grasas. Esto incluye los perros calientes, las costillas, el jamn, la salchicha, el salame y el tocino. Lcteos Leche entera y leche chocolatada. Crema cida. Crema. Mantequilla. Helados. Queso crema. Bebidas Caf y t negro, con o sin cafena Bebidas gaseosas o energizantes. Condimentos Salsa picante. Salsa barbacoa. Dulces/postres Chocolate y  cacao. Rosquillas. Menta y mentol. Grasas y aceites Alimentos con alto contenido de grasas, incluidas las papas fritas. Otros Vinagre. Especias picantes, como la pimienta negra, la pimienta blanca, la pimienta roja, la pimienta de cayena, el curry en polvo, los clavos de olor, el jengibre y el chile en polvo. Los artculos mencionados arriba pueden no ser una lista completa de las bebidas y los alimentos que se deben evitar. Comunquese con el nutricionista para recibir ms informacin. Esta informacin no tiene como fin reemplazar el consejo del mdico. Asegrese de hacerle al mdico cualquier pregunta que tenga. Document Released: 06/09/2005 Document Revised: 09/20/2014 Document Reviewed: 07/04/2013 Elsevier Interactive Patient Education  2017 [employer]  

## 2016-12-06 NOTE — Progress Notes (Signed)
Subjective:  Patient ID: Karina Murphy, female    DOB: 08/29/1981  Age: 36 y.o. MRN: [id number]  CC: Establish Care   HPI Amamda Curbow presents for  Interpreter service used Maureen Ralphs ID [id number] .   F/u abdominal pain: Reports minimal improvement of symptoms. Reports vomiting 3 to 4 times per week after eating spicy food, drinking, coffee, or eating greasy foods. Denies any difficulty swallowing, coffee ground emesis, or changes in bowel habits. She does report increased urinary frequency for 4 days. Denies any dysuria.   Hoarseness of voice: Started on Saturday. Denies any cough, rhinorrhea, or fever. She does reports sore throat and tongue. Denies any sick contacts. Denies taking anything for symptoms.  Rash: Reports pruritic rash on arm started Thursday. Denies any new contacts with soaps, detergents, foods, plants, animals, or close contact with others who have similar symptoms. Denies any increased pruritis at night.   History of IDA: Reports non-adherence due to abdominal pain and nausea. Denies any constipation.   Outpatient Medications Prior to Visit  Medication Sig Dispense Refill  . predniSONE (DELTASONE) 10 MG tablet Take 1 tablet (10 mg total) by mouth daily with breakfast. 30 tablet 2  . ferrous sulfate 325 (65 FE) MG tablet Take 1 tablet (325 mg total) by mouth 2 (two) times daily with a meal. 60 tablet 3  . omeprazole (PRILOSEC) 20 MG capsule Take 1 capsule (20 mg total) by mouth 2 (two) times daily before a meal. 60 capsule 2  . promethazine (PHENERGAN) 25 MG tablet Take 1 tablet (25 mg total) by mouth every 8 (eight) hours as needed for nausea or vomiting. 20 tablet 0  . sucralfate (CARAFATE) 1 g tablet Take 1 tablet (1 g total) by mouth 4 (four) times daily -  with meals and at bedtime. 20 tablet 0  . triamcinolone (KENALOG) 0.025 % cream Apply 1 application topically 2 (two) times daily. 30 g 0   Facility-Administered Medications Prior to Visit    Medication Dose Route Frequency Provider Last Rate Last Dose  . gi cocktail (Maalox,Lidocaine,Donnatal)  30 mL Oral Once [name], MD        ROS Review of Systems  Constitutional: Negative.   HENT: Negative.   Respiratory: Negative.   Cardiovascular: Negative.   Gastrointestinal: Positive for abdominal pain, nausea and vomiting. Negative for constipation.  Skin: Positive for rash.    Objective:  BP 106/70 (BP Location: Left Arm, Patient Position: Sitting, Cuff Size: Normal)   Pulse 79   Temp 98.5 F (36.9 C) (Oral)   Resp 18   Ht 5\' 3"  (1.6 m)   Wt 183 lb 3.2 oz (83.1 kg)   SpO2 100%   BMI 32.45 kg/m   BP/Weight 12/06/2016 11/19/2016 11/05/2016  Systolic BP 106 118 112  Diastolic BP 70 76 68  Wt. (Lbs) 183.2 177.4 179.4  BMI 32.45 31.42 31.78    Physical Exam  HENT:  Head: Normocephalic and atraumatic.  Right Ear: External ear normal.  Left Ear: External ear normal.  Nose: Nose normal.  Mouth/Throat: Oropharynx is clear and moist. No oropharyngeal exudate.  Eyes: Conjunctivae are normal. Pupils are equal, round, and reactive to light.  Neck: No JVD present.  Cardiovascular: Normal rate, regular rhythm, normal heart sounds and intact distal pulses.   Pulmonary/Chest: Effort normal and breath sounds normal.  Abdominal: Soft. Bowel sounds are normal. There is tenderness (RLQ).  Skin: Skin is warm and dry. Rash noted. Rash is urticarial (BUE).  Nursing note and vitals  reviewed.   Assessment & Plan:   Problem List Items Addressed This Visit    None    Visit Diagnoses    Abdominal pain, RLQ    -  Primary   -Urine dip showed moderate blood. Pt.reports starting menstrual cycle today, otherwise negative.   Relevant Orders   Urinalysis Dipstick (Completed)   CBC with Differential (Completed)   Gastroesophageal reflux disease, esophagitis presence not specified       Relevant Medications   metoCLOPramide (REGLAN) 10 MG tablet   Other Relevant Orders   H.  pylori breath test   Ambulatory referral to Gastroenterology   Viral pharyngitis       -Strep negative.    Relevant Orders   Rapid Strep A (Completed)   Non-intractable vomiting with nausea, unspecified vomiting type       Relevant Medications   metoCLOPramide (REGLAN) 10 MG tablet   Other Relevant Orders   Ambulatory referral to Gastroenterology   Chronic urticaria       Relevant Medications   fexofenadine (ALLEGRA ALLERGY) 180 MG tablet   triamcinolone (KENALOG) 0.025 % cream   Other Relevant Orders   Ambulatory referral to Allergy   Low iron stores       -Decreased dosage frequency for better medication adherence.    Relevant Medications   ferrous sulfate 325 (65 FE) MG tablet      Meds ordered this encounter  Medications  . metoCLOPramide (REGLAN) 10 MG tablet    Sig: Take 1 tablet (10 mg total) by mouth 4 (four) times daily -  before meals and at bedtime.    Dispense:  120 tablet    Refill:  1    Order Specific Question:   Supervising Provider    Answer:   Quentin Angst [id number]  . ferrous sulfate 325 (65 FE) MG tablet    Sig: Take 1 tablet (325 mg total) by mouth daily with breakfast.    Dispense:  30 tablet    Refill:  2    Order Specific Question:   Supervising Provider    Answer:   Quentin Angst [id number]  . fexofenadine (ALLEGRA ALLERGY) 180 MG tablet    Sig: Take 1 tablet (180 mg total) by mouth daily.    Dispense:  24 tablet    Refill:  6    Order Specific Question:   Supervising Provider    Answer:   Quentin Angst [id number]  . triamcinolone (KENALOG) 0.025 % cream    Sig: Apply 1 application topically 2 (two) times daily.    Dispense:  30 g    Refill:  0    Order Specific Question:   Supervising Provider    Answer:   Quentin Angst [id number]    Follow-up: Return  if symptoms worsen or fail to improve. Return in about 1 month (around 01/06/2017), for GERD.   Lizbeth Bark FNP

## 2016-12-06 NOTE — Progress Notes (Signed)
Patient is here for f/up abdominal pain every  Time she eats  Patient has some rashes on her arm since Thursday have taking benadryl

## 2016-12-07 LAB — CBC WITH DIFFERENTIAL/PLATELET
Basophils Absolute: 0 x10E3/uL (ref 0.0–0.2)
Basos: 0 %
EOS (ABSOLUTE): 0.5 x10E3/uL — ABNORMAL HIGH (ref 0.0–0.4)
Eos: 6 %
Hematocrit: 34.4 % (ref 34.0–46.6)
Hemoglobin: 11.1 g/dL (ref 11.1–15.9)
Immature Grans (Abs): 0 x10E3/uL (ref 0.0–0.1)
Immature Granulocytes: 0 %
Lymphocytes Absolute: 1.9 x10E3/uL (ref 0.7–3.1)
Lymphs: 22 %
MCH: 28 pg (ref 26.6–33.0)
MCHC: 32.3 g/dL (ref 31.5–35.7)
MCV: 87 fL (ref 79–97)
Monocytes Absolute: 0.7 x10E3/uL (ref 0.1–0.9)
Monocytes: 9 %
Neutrophils Absolute: 5.5 x10E3/uL (ref 1.4–7.0)
Neutrophils: 63 %
Platelets: 369 x10E3/uL (ref 150–379)
RBC: 3.97 x10E6/uL (ref 3.77–5.28)
RDW: 15.2 % (ref 12.3–15.4)
WBC: 8.6 x10E3/uL (ref 3.4–10.8)

## 2016-12-08 LAB — H. PYLORI BREATH TEST

## 2016-12-08 LAB — H.PYLORI BREATH TEST (REFLEX): H. pylori Breath Test: NEGATIVE

## 2016-12-14 ENCOUNTER — Telehealth: Payer: Self-pay

## 2016-12-14 NOTE — Telephone Encounter (Signed)
-----   Message from Lizbeth Bark, FNP sent at 12/13/2016  2:57 PM EDT ----- Labs normal. White blood cell count is normal. WBC which can be elevated with infection or inflammation. H.pylori is negative. H.pylori is a bacteria that can infect the stomach and cause stomach ulcers.

## 2016-12-14 NOTE — Telephone Encounter (Signed)
CMA call to go over lab results  Patient Verify DOB  Patient was aware and understood   

## 2016-12-17 MED FILL — METOCLOPRAMIDE 10 MG TABLET: 10 | 30 days supply | Qty: 120 | Fill #0

## 2016-12-17 MED FILL — TRIAMCINOLONE 0.025% CREAM: 0.025 | 15 days supply | Qty: 30 | Fill #0

## 2016-12-17 MED FILL — ?FEXOFENADINE HCL 180 MG TA: 180 | 24 days supply | Qty: 24 | Fill #0

## 2017-01-06 ENCOUNTER — Ambulatory Visit: Payer: Self-pay | Attending: Family Medicine | Admitting: Family Medicine

## 2017-01-06 VITALS — BP 113/72 | HR 80 | Temp 98.3°F | Resp 18 | Ht 63.0 in | Wt 185.8 lb

## 2017-01-06 DIAGNOSIS — R1032 Left lower quadrant pain: Secondary | ICD-10-CM

## 2017-01-06 DIAGNOSIS — R110 Nausea: Secondary | ICD-10-CM | POA: Insufficient documentation

## 2017-01-06 DIAGNOSIS — F419 Anxiety disorder, unspecified: Secondary | ICD-10-CM

## 2017-01-06 DIAGNOSIS — R140 Abdominal distension (gaseous): Secondary | ICD-10-CM

## 2017-01-06 LAB — POCT URINALYSIS DIPSTICK
Bilirubin, UA: NEGATIVE
Glucose, UA: NEGATIVE
KETONES UA: NEGATIVE
Leukocytes, UA: NEGATIVE
Nitrite, UA: NEGATIVE
PH UA: 5.5 (ref 5.0–8.0)
PROTEIN UA: NEGATIVE
RBC UA: NEGATIVE
SPEC GRAV UA: 1.02 (ref 1.010–1.025)
Urobilinogen, UA: 0.2 E.U./dL

## 2017-01-06 LAB — POCT URINE PREGNANCY: PREG TEST UR: NEGATIVE

## 2017-01-06 MED ORDER — ESCITALOPRAM OXALATE 10 MG PO TABS: 10.0000 mg | ORAL_TABLET | Freq: Every day | ORAL | 0 refills | Status: DC

## 2017-01-06 MED ORDER — SIMETHICONE 80 MG PO CHEW: 80.0000 mg | CHEWABLE_TABLET | Freq: Four times a day (QID) | ORAL | 0 refills | Status: DC | PRN

## 2017-01-06 MED FILL — ESCITALOPRAM 10 MG TABLET: 10 | 30 days supply | Qty: 90 | Fill #0

## 2017-01-06 NOTE — Progress Notes (Signed)
Subjective:  Patient ID: Karina Murphy, female    DOB: [birth year]  Age: 36 y.o. MRN: [id number]  CC: Establish Care   HPI Karina Murphy presents for   Interpreter services used: De Nurse [id number] and Gordy Councilman [id number]  F/u abdominal pain: Reports minimal improvement of symptoms. Reports nausea and bloating after eating meals,spicy food, drinking, coffee, or eating greasy foods.Denies any difficulty swallowing or changes in bowel habits.    Anxiety: Symptoms of anxiety worsened 1 week ago. She is requesting medication to help with symptoms. Denies any SI/HI. Denies any seeking any counseling resources in the past.   Outpatient Medications Prior to Visit  Medication Sig Dispense Refill  . ferrous sulfate 325 (65 FE) MG tablet Take 1 tablet (325 mg total) by mouth daily with breakfast. 30 tablet 2  . triamcinolone (KENALOG) 0.025 % cream Apply 1 application topically 2 (two) times daily. 30 g 0  . fexofenadine (ALLEGRA ALLERGY) 180 MG tablet Take 1 tablet (180 mg total) by mouth daily. 24 tablet 6  . metoCLOPramide (REGLAN) 10 MG tablet Take 1 tablet (10 mg total) by mouth 4 (four) times daily -  before meals and at bedtime. 120 tablet 1  . omeprazole (PRILOSEC) 20 MG capsule Take 1 capsule (20 mg total) by mouth 2 (two) times daily before a meal. 60 capsule 2  . predniSONE (DELTASONE) 10 MG tablet Take 1 tablet (10 mg total) by mouth daily with breakfast. 30 tablet 2  . promethazine (PHENERGAN) 25 MG tablet Take 1 tablet (25 mg total) by mouth every 8 (eight) hours as needed for nausea or vomiting. 20 tablet 0  . sucralfate (CARAFATE) 1 g tablet Take 1 tablet (1 g total) by mouth 4 (four) times daily -  with meals and at bedtime. 20 tablet 0   Facility-Administered Medications Prior to Visit  Medication Dose Route Frequency Provider Last Rate Last Dose  . gi cocktail (Maalox,Lidocaine,Donnatal)  30 mL Oral Once [name], MD        ROS Review of Systems    Constitutional: Negative.   HENT: Negative.   Respiratory: Negative.   Cardiovascular: Negative.   Gastrointestinal: Positive for abdominal pain and nausea.       Abdominal bloating  Genitourinary: Negative.   Psychiatric/Behavioral: The patient is nervous/anxious.    Objective:  BP 113/72 (BP Location: Left Arm, Patient Position: Sitting, Cuff Size: Normal)   Pulse 80   Temp 98.3 F (36.8 C) (Oral)   Resp 18   Ht 5\' 3"  (1.6 m)   Wt 185 lb 12.8 oz (84.3 kg)   LMP 12/31/2016   SpO2 (!) 5%   BMI 32.91 kg/m   BP/Weight 01/06/2017 12/06/2016 11/19/2016  Systolic BP 113 106 118  Diastolic BP 72 70 76  Wt. (Lbs) 185.8 183.2 177.4  BMI 32.91 32.45 31.42     Physical Exam  HENT:  Head: Normocephalic.  Right Ear: External ear normal.  Left Ear: External ear normal.  Nose: Nose normal.  Mouth/Throat: Oropharynx is clear and moist.  Eyes: Conjunctivae are normal. Pupils are equal, round, and reactive to light.  Neck: Normal range of motion. Neck supple.  Cardiovascular: Normal rate, regular rhythm, normal heart sounds and intact distal pulses.   Pulmonary/Chest: Effort normal and breath sounds normal.  Abdominal: Soft. Bowel sounds are normal. There is tenderness (LLQ pain).  Lymphadenopathy:    She has no cervical adenopathy.  Skin: Skin is warm and dry.  Psychiatric: Her mood appears anxious. She expresses no homicidal  and no suicidal ideation. She expresses no suicidal plans and no homicidal plans.  Nursing note and vitals reviewed.    Assessment & Plan:   Problem List Items Addressed This Visit    None    Visit Diagnoses    Abdominal bloating    -  Primary   Relevant Medications   simethicone (GAS-X) 80 MG chewable tablet   LLQ pain       Relevant Orders   POCT urine pregnancy (Completed)   POCT urinalysis dipstick (Completed)   Basic Metabolic Panel (Completed)   CBC with Differential (Completed)   Anxiety       Given counseling resources   Relevant  Medications   escitalopram (LEXAPRO) 10 MG tablet      Meds ordered this encounter  Medications  . simethicone (GAS-X) 80 MG chewable tablet    Sig: Chew 1 tablet (80 mg total) by mouth every 6 (six) hours as needed for flatulence.    Dispense:  30 tablet    Refill:  0    Order Specific Question:   Supervising Provider    Answer:   Quentin Angst [id number]  . escitalopram (LEXAPRO) 10 MG tablet    Sig: Take 1 tablet (10 mg total) by mouth daily. After 4 weeks may increase to 2 tablets ( 20 mg total) by mouth daily.    Dispense:  90 tablet    Refill:  0    Order Specific Question:   Supervising Provider    Answer:   Quentin Angst [id number]    Follow-up: Return in about 8 weeks (around 03/03/2017).   Lizbeth Bark FNP

## 2017-01-06 NOTE — Patient Instructions (Signed)
Trastorno de ansiedad generalizada (Generalized Anxiety Disorder) El trastorno de ansiedad generalizada es un trastorno mental. Interfiere en las funciones vitales, incluyendo las [place], el trabajo y la escuela.  Es diferente de la ansiedad normal que todas las personas experimentan en algn momento de su vida en respuesta a sucesos y Chief Operating Officer. En verdad, la ansiedad normal nos ayuda a prepararnos y Human resources officer acontecimientos y actividades de la vida. La ansiedad normal desaparece despus de que el evento o la actividad ha finalizado.  El trastorno de ansiedad generalizada no est necesariamente relacionada con eventos o actividades especficas. Tambin causa un exceso de ansiedad en proporcin a sucesos o actividades especficas. En este trastorno la ansiedad es difcil de Chief Operating Officer. Los sntomas pueden variar de leves a muy graves. Las personas que sufren de trastorno de ansiedad generalizada pueden tener intensas olas de ansiedad con sntomas fsicos (ataques de pnico).  SNTOMAS  La ansiedad y la preocupacin asociada a este trastorno son difciles de Chief Operating Officer. Esta ansiedad y la preocupacin estn relacionados con muchos eventos de la vida y sus actividades y tambin ocurre durante ms [employer] que no ocurre, durante 6 meses o ms. Las personas que la sufren pueden tener tres o ms de los siguientes sntomas (uno o ms en los nios):   Glass blower/designer.  Dificultades de concentracin.   Irritabilidad.  Tensin muscular  Dificultad para dormirse o sueo poco satisfactorio. DIAGNSTICO  Se diagnostica a travs de una evaluacin realizada por el mdico. El mdico le har preguntas acerca de su estado de nimo, sntomas fsicos y sucesos de [place] vida. Le har preguntas sobre su historia clnica, el consumo de alcohol o drogas, incluyendo los medicamentos recetados. [employer] un examen fsico e indicar anlisis de [place]. Ciertas enfermedades y el uso de  determinadas sustancias pueden causar sntomas similares a este trastorno. Su mdico lo puede derivar a Music therapist en salud mental para una evaluacin ms profunda.Gerlean Ren  Las terapias siguientes se utilizan en el tratamiento de este trastorno:   Medicamentos - Se recetan antidepresivos para el control diario a Air cabin crew. Pueden indicarse tambin medicamentos para combatir la [employer] graves, especialmente cuando ocurren ataques de pnico.   Terapia conversada (psicoterapia) Ciertos tipos de psicoterapia pueden ser tiles en el tratamiento del trastorno de ansiedad generalizada, proporcionando apoyo, educacin y Optometrist. Una forma de psicoterapia llamada terapia cognitivo-conductual puede ensearle formas saludables de pensar y Publishing rights manager a los eventos y actividades de la vida diaria.  Tcnicasde manejo del estrs- Estas tcnicas incluyen el yoga, la meditacin y el ejercicio y pueden ser muy tiles cuando se practican con regularidad. Un especialista en salud mental puede ayudar a determinar qu tratamiento es mejor para usted. Algunas personas obtienen mejora con una terapia. Sin embargo, Economist requieren una combinacin de terapias.  Esta informacin no tiene Theme park manager el consejo del mdico. Asegrese de hacerle al mdico cualquier pregunta que tenga. Document Released: 12/25/2012 Document Revised: 09/20/2014 Elsevier Interactive Patient Education  2017 [employer].  Escitalopram tablets What is this medicine? ESCITALOPRAM (es sye TAL oh pram) is used to treat depression and certain types of anxiety. This medicine may be used for other purposes; ask your health care provider or pharmacist if you have questions. COMMON BRAND NAME(S): Lexapro What should I tell my health care provider before I take this medicine? They need to know if you have any of these conditions: -bipolar disorder or a family history of bipolar  disorder -diabetes -glaucoma -heart disease -kidney or liver disease -receiving electroconvulsive therapy -seizures (convulsions) -suicidal thoughts, plans, or attempt by you or a family member -an unusual or allergic reaction to escitalopram, the related drug citalopram, other medicines, foods, dyes, or preservatives -pregnant or trying to become pregnant -breast-feeding How should I use this medicine? Take this medicine by mouth with a glass of water. Follow the directions on the prescription label. You can take it with or without food. If it upsets your stomach, take it with food. Take your medicine at regular intervals. Do not take it more often than directed. Do not stop taking this medicine suddenly except upon the advice of your doctor. Stopping this medicine too quickly may cause serious side effects or your condition may worsen. A special MedGuide will be given to you by the pharmacist with each prescription and refill. Be sure to read this information carefully each time. Talk to your pediatrician regarding the use of this medicine in children. Special care may be needed. Overdosage: If you think you have taken too much of this medicine contact a poison control center or emergency room at once. NOTE: This medicine is only for you. Do not share this medicine with others. What if I miss a dose? If you miss a dose, take it as soon as you can. If it is almost time for your next dose, take only that dose. Do not take double or extra doses. What may interact with this medicine? Do not take this medicine with any of the following medications: -certain medicines for fungal infections like fluconazole, itraconazole, ketoconazole, posaconazole, voriconazole -cisapride -citalopram -dofetilide -dronedarone -linezolid -MAOIs like Carbex, Eldepryl, Marplan, Nardil, and Parnate -methylene blue (injected into a vein) -pimozide -thioridazine -ziprasidone This medicine may also interact with  the following medications: -alcohol -amphetamines -aspirin and aspirin-like medicines -carbamazepine -certain medicines for depression, anxiety, or psychotic disturbances -certain medicines for migraine headache like almotriptan, eletriptan, frovatriptan, naratriptan, rizatriptan, sumatriptan, zolmitriptan -certain medicines for sleep -certain medicines that treat or prevent blood clots like warfarin, enoxaparin, dalteparin -cimetidine -diuretics -fentanyl -furazolidone -isoniazid -lithium -metoprolol -NSAIDs, medicines for pain and inflammation, like ibuprofen or naproxen -other medicines that prolong the QT interval (cause an abnormal heart rhythm) -procarbazine -rasagiline -supplements like St. John's wort, kava kava, valerian -tramadol -tryptophan This list may not describe all possible interactions. Give your health care provider a list of all the medicines, herbs, non-prescription drugs, or dietary supplements you use. Also tell them if you smoke, drink alcohol, or use illegal drugs. Some items may interact with your medicine. What should I watch for while using this medicine? Tell your doctor if your symptoms do not get better or if they get worse. Visit your doctor or health care professional for regular checks on your progress. Because it may take several weeks to see the full effects of this medicine, it is important to continue your treatment as prescribed by your doctor. Patients and their families should watch out for new or worsening thoughts of suicide or depression. Also watch out for sudden changes in feelings such as feeling anxious, agitated, panicky, irritable, hostile, aggressive, impulsive, severely restless, overly excited and hyperactive, or not being able to sleep. If this happens, especially at the beginning of treatment or after a change in dose, call your health care professional. Bonita Quin may get drowsy or dizzy. Do not drive, use machinery, or do anything that  needs mental alertness until you know how this medicine affects you. Do not stand or sit  up quickly, especially if you are an older patient. This reduces the risk of dizzy or fainting spells. Alcohol may interfere with the effect of this medicine. Avoid alcoholic drinks. Your mouth may get dry. Chewing sugarless gum or sucking hard candy, and drinking plenty of water may help. Contact your doctor if the problem does not go away or is severe. What side effects may I notice from receiving this medicine? Side effects that you should report to your doctor or health care professional as soon as possible: -allergic reactions like skin rash, itching or hives, swelling of the face, lips, or tongue -anxious -black, tarry stools -changes in vision -confusion -elevated mood, decreased need for sleep, racing thoughts, impulsive behavior -eye pain -fast, irregular heartbeat -feeling faint or lightheaded, falls -feeling agitated, angry, or irritable -hallucination, loss of contact with reality -loss of balance or coordination -loss of memory -painful or prolonged erections -restlessness, pacing, inability to keep still -seizures -stiff muscles -suicidal thoughts or other mood changes -trouble sleeping -unusual bleeding or bruising -unusually weak or tired -vomiting Side effects that usually do not require medical attention (report to your doctor or health care professional if they continue or are bothersome): -changes in appetite -change in sex drive or performance -headache -increased sweating -indigestion, nausea -tremors This list may not describe all possible side effects. Call your doctor for medical advice about side effects. You may report side effects to FDA at 1-800-FDA-1088. Where should I keep my medicine? Keep out of reach of children. Store at room temperature between 15 and 30 degrees C (59 and 86 degrees F). Throw away any unused medicine after the expiration date. NOTE: This  sheet is a summary. It may not cover all possible information. If you have questions about this medicine, talk to your doctor, pharmacist, or health care provider.  2018 Elsevier/Gold Standard (2016-02-02 13:20:23)

## 2017-01-06 NOTE — Progress Notes (Signed)
Patient is here for f/up  Patient complain about abdominal pain  N/V only when eating she vomits

## 2017-01-07 LAB — CBC WITH DIFFERENTIAL/PLATELET
BASOS ABS: 0 10*3/uL (ref 0.0–0.2)
Basos: 0 %
EOS (ABSOLUTE): 0.3 10*3/uL (ref 0.0–0.4)
EOS: 5 %
HEMATOCRIT: 37.9 % (ref 34.0–46.6)
HEMOGLOBIN: 12.3 g/dL (ref 11.1–15.9)
IMMATURE GRANS (ABS): 0 10*3/uL (ref 0.0–0.1)
IMMATURE GRANULOCYTES: 0 %
LYMPHS ABS: 1.9 10*3/uL (ref 0.7–3.1)
LYMPHS: 25 %
MCH: 28.9 pg (ref 26.6–33.0)
MCHC: 32.5 g/dL (ref 31.5–35.7)
MCV: 89 fL (ref 79–97)
MONOCYTES: 6 %
Monocytes Absolute: 0.5 10*3/uL (ref 0.1–0.9)
NEUTROS PCT: 64 %
Neutrophils Absolute: 4.9 10*3/uL (ref 1.4–7.0)
Platelets: 360 10*3/uL (ref 150–379)
RBC: 4.26 x10E6/uL (ref 3.77–5.28)
RDW: 15 % (ref 12.3–15.4)
WBC: 7.6 10*3/uL (ref 3.4–10.8)

## 2017-01-07 LAB — BASIC METABOLIC PANEL
BUN / CREAT RATIO: 22 (ref 9–23)
BUN: 14 mg/dL (ref 6–20)
CALCIUM: 9.7 mg/dL (ref 8.7–10.2)
CHLORIDE: 100 mmol/L (ref 96–106)
CO2: 24 mmol/L (ref 18–29)
CREATININE: 0.65 mg/dL (ref 0.57–1.00)
GFR calc Af Amer: 134 mL/min/{1.73_m2} (ref >59)
GFR calc non Af Amer: 116 mL/min/{1.73_m2} (ref >59)
GLUCOSE: 91 mg/dL (ref 65–99)
Potassium: 4.3 mmol/L (ref 3.5–5.2)
Sodium: 139 mmol/L (ref 134–144)

## 2017-01-11 ENCOUNTER — Ambulatory Visit: Payer: Self-pay | Admitting: Allergy and Immunology

## 2017-01-12 ENCOUNTER — Ambulatory Visit (INDEPENDENT_AMBULATORY_CARE_PROVIDER_SITE_OTHER): Payer: Self-pay | Admitting: Allergy and Immunology

## 2017-01-12 ENCOUNTER — Telehealth: Payer: Self-pay

## 2017-01-12 ENCOUNTER — Encounter: Payer: Self-pay | Admitting: Allergy and Immunology

## 2017-01-12 VITALS — BP 112/72 | HR 80 | Temp 98.0°F | Resp 14 | Ht 63.5 in | Wt 183.0 lb

## 2017-01-12 DIAGNOSIS — L500 Allergic urticaria: Secondary | ICD-10-CM

## 2017-01-12 DIAGNOSIS — D721 Eosinophilia, unspecified: Secondary | ICD-10-CM

## 2017-01-12 DIAGNOSIS — L989 Disorder of the skin and subcutaneous tissue, unspecified: Secondary | ICD-10-CM

## 2017-01-12 DIAGNOSIS — M069 Rheumatoid arthritis, unspecified: Secondary | ICD-10-CM

## 2017-01-12 DIAGNOSIS — K219 Gastro-esophageal reflux disease without esophagitis: Secondary | ICD-10-CM

## 2017-01-12 DIAGNOSIS — J3089 Other allergic rhinitis: Secondary | ICD-10-CM

## 2017-01-12 DIAGNOSIS — L308 Other specified dermatitis: Secondary | ICD-10-CM

## 2017-01-12 MED ORDER — CETIRIZINE HCL 10 MG PO TABS: ORAL_TABLET | ORAL | 5 refills | Status: DC

## 2017-01-12 MED ORDER — MONTELUKAST SODIUM 10 MG PO TABS: 10.0000 mg | ORAL_TABLET | Freq: Every day | ORAL | 5 refills | Status: DC

## 2017-01-12 MED FILL — MONTELUKAST SOD 10 MG TAB: 10 | 30 days supply | Qty: 30 | Fill #0

## 2017-01-12 MED FILL — ?CETIRIZINE HCL 10 MG TABLE: 10 | 30 days supply | Qty: 60 | Fill #0

## 2017-01-12 NOTE — Patient Instructions (Addendum)
  1. Allergen avoidance measures?  2. Every day utilize the following medications:   A. cetirizine 10 mg one tablet twice a day  B. montelukast 10 mg one tablet once a day  3. Can continue to utilize topical triamcinolone cream and Benadryl if needed  4. Blood - TSH, T4, TP, alpha gal panel, celiac screen with IgA, CRP, sed  5. Return to clinic in 2 weeks or earlier if problem

## 2017-01-12 NOTE — Telephone Encounter (Signed)
CMA call regarding lab results  Patient did not answer but left a VM stating the reason of the lab & to call me back

## 2017-01-12 NOTE — Telephone Encounter (Signed)
-----   Message from Lizbeth Bark, [place] sent at 01/11/2017  4:09 PM EDT ----- Kidney function normal Labs indicate no infection, anemia, or inflammation present Follow up with GI referral.

## 2017-01-12 NOTE — Progress Notes (Signed)
Dear Karina Murphy,  Thank you for referring Tekeyah Gundry to the [facility] [place] on 01/12/2017.   Below is a summation of this patient's evaluation and recommendations.  Thank you for your referral. I will keep you informed about this patient's response to treatment.   If you have any questions please do not hesitate to contact me.   Sincerely,  Jessica Priest, MD Allergy / Immunology [facility]   ______________________________________________________________________    NEW PATIENT NOTE  Referring Provider: Thomas Hoff* Primary Provider: Arrie Senate, FNP Date of office visit: 01/12/2017    Subjective:   Chief Complaint:  Karina Murphy (DOB: [birth year]) is a 36 y.o. female who presents to the clinic on 01/12/2017 with a chief complaint of Urticaria (patches all over body. if scratched they become bigger. ) and Allergic Rhinitis  (nasal congestion. ) .     HPI: Danaisha presents to this clinic in evaluation of several different issues.  First, for approximately 4 months she's been having itchy skin with red raised lesions and what sounds like dermatographia and she hs excoriating her skin which leaves behind some scar. It does not sound as though her skin lesions give rise to a scar or pigment change but rather where she scratches gives rise to a scar. She has no other associated systemic or constitutional symptoms. She has been treated with multiple medications which have not resulted in good control of this issue. It does sound as though she has required systemic steroids on a few occasions in an attempt to control this issue. She did end up in the emergency room on one occasion for this issue.  Second, she has a history of rheumatoid arthritis that presently is untreated. It sounds as though she has not had a modifying agent in almost a year other than  the fact that she took sulfasalazine for about one week 2 months ago but may of had an exacerbation of her skin condition while utilizing this agent. As well, she was given diclofenac around the same point in time which she has also discontinued. She was on methotrexate but has not been on this agent for over a year. She was followed at [facility] for this issue but is not had a follow-up visit in one year.  Third, she has lost 53 pounds of weight over the course of the past 6-8 months. This does not sound as though it was a voluntary loss of weight.  Fourth, she has bloating and regurgitation and vomiting twice a day and has been evaluated by a gastroenterologist over the course of the past 6 months for this issue. She had an upper endoscopy which apparently was normal. She's been treated with multiple medications for this issue which has resulted in a decrease in her nausea and bloating yet she still continues to have problems. She states that if she eats pineapples or egg she gets problems with stomach upset and vomiting but no diarrhea or other associated systemic or constitutional symptoms.  Fifth, over the course of the past several days she's had some nasal congestion and sneezing and a bad taste in her throat and some itchy eyes. This is relatively new for her as she does not really have a history of having recurrent respiratory tract problems.  Past Medical History:  Diagnosis Date  . Anxiety   . Arthritis, rheumatoid (HCC)   . Chronic headaches   .  Hypertension   . Urticaria     Past Surgical History:  Procedure Laterality Date  . NO PAST SURGERIES      Allergies as of 01/12/2017      Reactions   Eggs Or Egg-derived Products Nausea And Vomiting   Nausea/vomitting only with whole eggs, no problem with eggs used in ingredients   Pineapple       Medication List      escitalopram 10 MG tablet Commonly known as:  LEXAPRO Take 1 tablet (10 mg total) by mouth daily. After 4  weeks may increase to 2 tablets ( 20 mg total) by mouth daily.   ferrous sulfate 325 (65 FE) MG tablet Take 1 tablet (325 mg total) by mouth daily with breakfast.   fexofenadine 180 MG tablet Commonly known as:  ALLEGRA ALLERGY Take 1 tablet (180 mg total) by mouth daily.   promethazine 25 MG tablet Commonly known as:  PHENERGAN Take 1 tablet (25 mg total) by mouth every 8 (eight) hours as needed for nausea or vomiting.   sucralfate 1 g tablet Commonly known as:  CARAFATE Take 1 tablet (1 g total) by mouth 4 (four) times daily -  with meals and at bedtime.   triamcinolone 0.025 % cream Commonly known as:  KENALOG Apply 1 application topically 2 (two) times daily.       Review of systems negative except as noted in HPI / PMHx or noted below:  Review of Systems  Constitutional: Negative.   HENT: Negative.   Eyes: Negative.   Respiratory: Negative.   Cardiovascular: Negative.   Gastrointestinal: Negative.   Genitourinary: Negative.   Musculoskeletal: Negative.   Skin: Negative.   Neurological: Negative.   Endo/Heme/Allergies: Negative.   Psychiatric/Behavioral: Negative.     Family History  Problem Relation Age of Onset  . Diabetes Mother   . Hypertension Mother   . Stomach cancer Neg Hx   . Colon cancer Neg Hx     Social History   Social History  . Marital status: Single    Spouse name: N/A  . Number of children: 2  . Years of education: N/A   Occupational History  . Not on file.   Social History Main Topics  . Smoking status: Never Smoker  . Smokeless tobacco: Never Used  . Alcohol use No  . Drug use: No  . Sexual activity: Yes    Birth control/ protection: Condom   Other Topics Concern  . Not on file   Social History Narrative   Completed 2nd grade.     Environmental and Social history  Lives in an apartment with a dry environment, no animals located inside the household, carpeting in the bedroom, no plastic on the bed or pillow, and no  smoking with inside the household. She works as a Financial risk analyst.  Objective:   Vitals:   01/12/17 0912  BP: 112/72  Pulse: 80  Resp: 14  Temp: 98 F (36.7 C)   Height: 5' 3.5" (161.3 cm) Weight: 183 lb (83 kg)  Physical Exam  Constitutional: She is well-developed, well-nourished, and in no distress.  HENT:  Head: Normocephalic. Head is without right periorbital erythema and without left periorbital erythema.  Right Ear: Tympanic membrane, external ear and ear canal normal.  Left Ear: Tympanic membrane, external ear and ear canal normal.  Nose: Nose normal. No mucosal edema or rhinorrhea.  Mouth/Throat: Oropharynx is clear and moist and mucous membranes are normal. No oropharyngeal exudate.  Eyes: Conjunctivae and lids are  normal. Pupils are equal, round, and reactive to light.  Neck: Trachea normal. No tracheal deviation present. No thyromegaly present.  Cardiovascular: Normal rate, regular rhythm, S1 normal, S2 normal and normal heart sounds.   No murmur heard. Pulmonary/Chest: Effort normal. No stridor. No tachypnea. No respiratory distress. She has no wheezes. She has no rales. She exhibits no tenderness.  Abdominal: Soft. She exhibits no distension and no mass. There is no hepatosplenomegaly. There is no tenderness. There is no rebound and no guarding.  Musculoskeletal: She exhibits no edema or tenderness.  Lymphadenopathy:       Head (right side): No tonsillar adenopathy present.       Head (left side): No tonsillar adenopathy present.    She has no cervical adenopathy.    She has no axillary adenopathy.  Neurological: She is alert. Gait normal.  Skin: Rash (Excoriated papules right forearm) noted. She is not diaphoretic. No erythema. No pallor. Nails show no clubbing.  Psychiatric: Mood and affect normal.    Diagnostics: Allergy skin tests were performed. She demonstrated hypersensitivity to grasses, weeds, trees, and house dust mite, cockroach. She did not demonstrate any  hypersensitivity to a screening panel of foods.  Results of blood tests obtained 01/06/2017 identified normal hepatic and renal function, white blood cell count 7.6 with a normal differential and an absolute eosinophil count of 300, hemoglobin 12.3, platelet 360. She had a negative urine pregnancy test and a negative urinalysis. She had a negative Helicobacter pylori breath test on 12/06/2016 and a negative strep test on the same date.  Results of a upper endoscopy performed 10/06/2015 identify the following:  1. The mucosa of the esophagus appeared normal 2. There was mild antral gastropathy noted [T2] 3. The duodenal mucosa showed no abnormalities in the bulb and 2nd part of the duodenum  Results of upper endoscopy biopsy performed 10/06/2015 identify the following:  Surgical [P], gastric antrum and gastric body gastric biopsy BENIGN GASTRIC MUCOSA NO HELICOBACTER PYLORI ORGANISMS ARE IDENTIFIED (WARTHIN-STARRY STAIN) NEGATIVE FOR DYSPLASIA OR MALIGNANCY  Apparently no esophageal biopsy or small bowel biopsy was performed.  Results of a upper GI performed 15 Jan 2016 identify the following:  1. Small sliding hiatal hernia. 2. Mild reflux  Results of a gastric imaging scan performed 11/18/2015 identify the following:  Normal gastric emptying study    Assessment and Plan:    1. Allergic urticaria   2. Inflammatory dermatosis   3. Eosinophilia   4. Rheumatoid arthritis flare (HCC)   5. Gastroesophageal reflux disease, esophagitis presence not specified   6. Other allergic rhinitis     1. Allergen avoidance measures  2. Every day utilize the following medications:   A. cetirizine 10 mg one tablet twice a day  B. montelukast 10 mg one tablet once a day  3. Can continue to utilize topical triamcinolone cream and Benadryl if needed  4. Blood - TSH, T4, TP, alpha gal panel, celiac screen with IgA, CRP, sed  5. 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 All this may be related to her atopic disease or possibly there may be a component secondary to her rheumatoid arthritis or some other etiologic factor. I will be checking her blood to look at other forms of immunological hyperreactivity and I've given her a combination of an H1 receptor blocker and a leukotriene modifier to see if we can get  her hyperreactivity under better control. I will regroup with her in 2 weeks to make an assessment of her response to this approach and consider further evaluation and treatment pending her response.  Jessica Priest, MD [facility]

## 2017-01-13 ENCOUNTER — Other Ambulatory Visit: Payer: Self-pay

## 2017-01-13 ENCOUNTER — Encounter: Payer: Self-pay | Admitting: Gastroenterology

## 2017-01-13 ENCOUNTER — Ambulatory Visit (INDEPENDENT_AMBULATORY_CARE_PROVIDER_SITE_OTHER): Payer: Self-pay | Admitting: Gastroenterology

## 2017-01-13 VITALS — BP 104/70 | HR 80 | Ht 64.0 in | Wt 184.0 lb

## 2017-01-13 DIAGNOSIS — R1010 Upper abdominal pain, unspecified: Secondary | ICD-10-CM

## 2017-01-13 DIAGNOSIS — K219 Gastro-esophageal reflux disease without esophagitis: Secondary | ICD-10-CM

## 2017-01-13 DIAGNOSIS — R197 Diarrhea, unspecified: Secondary | ICD-10-CM

## 2017-01-13 DIAGNOSIS — R1115 Cyclical vomiting syndrome unrelated to migraine: Secondary | ICD-10-CM

## 2017-01-13 DIAGNOSIS — G43A0 Cyclical vomiting, not intractable: Secondary | ICD-10-CM

## 2017-01-13 MED ORDER — OMEPRAZOLE 20 MG PO CPDR: 20.0000 mg | DELAYED_RELEASE_CAPSULE | Freq: Every day | ORAL | 3 refills | Status: DC

## 2017-01-13 MED ORDER — RANITIDINE HCL 150 MG PO TABS: 150.0000 mg | ORAL_TABLET | Freq: Every day | ORAL | 3 refills | Status: DC

## 2017-01-13 NOTE — Patient Instructions (Addendum)
Use FD Gard 1-2 capsules three times  a day as needed  We have sent in omeprazole and ranitidine to your pharmacy  Go to the basement for your lab kit today    

## 2017-01-13 NOTE — Progress Notes (Signed)
Karina Murphy    [id number]    11/19/1980  Primary Care Physician:Mandesia Hairston, FNP  Referring Physician: Lizbeth Bark, FNP [address] [place], [place] [address]  Chief complaint:  Nausea, Diarrhea, Abdominal pain  HPI:  36 year old female Spanish-speaking (video translator was used during this visit) with history of rheumatoid arthritis here with complaints of persistent nausea with intermittent vomiting and upper abdominal pain radiating to the right. Patient was seen with similar complaints in December 2016 and also April 2017. She has had extensive GI workup including EGD with gastric biopsies negative for H. pylori, mild gastropathy otherwise unremarkable. CT abdomen and pelvis with contrast did not show any acute abnormality. CT head was normal. HIDA scan showed normal gallbladder ejection fraction. Gastric emptying scan showed rapid emptying with 98% emptied at 2 hours. Patient complains of nausea on daily basis and vomiting 1-2 times a day. She is not able to eat. Her weight has remained stable around 185 pounds for past one year. Patient complains of recent onset increased bowel frequency with 3-4 bowel movements a day, intermittent liquid stool along with formed stool for past 7-10 days. Denies any melena or blood per rectum.   Outpatient Encounter Prescriptions as of 01/13/2017  Medication Sig  . cetirizine (ZYRTEC) 10 MG tablet Take one tablet by mouth twice daily as directed.  . escitalopram (LEXAPRO) 10 MG tablet Take 1 tablet (10 mg total) by mouth daily. After 4 weeks may increase to 2 tablets ( 20 mg total) by mouth daily.  . ferrous sulfate 325 (65 FE) MG tablet Take 1 tablet (325 mg total) by mouth daily with breakfast.  . fexofenadine (ALLEGRA ALLERGY) 180 MG tablet Take 1 tablet (180 mg total) by mouth daily.  . montelukast (SINGULAIR) 10 MG tablet Take 1 tablet (10 mg total) by mouth daily.  . promethazine (PHENERGAN) 25 MG tablet  Take 1 tablet (25 mg total) by mouth every 8 (eight) hours as needed for nausea or vomiting.  . sucralfate (CARAFATE) 1 g tablet Take 1 tablet (1 g total) by mouth 4 (four) times daily -  with meals and at bedtime.  . triamcinolone (KENALOG) 0.025 % cream Apply 1 application topically 2 (two) times daily.   Facility-Administered Encounter Medications as of 01/13/2017  Medication  . gi cocktail (Maalox,Lidocaine,Donnatal)    Allergies as of 01/13/2017 - Review Complete 01/13/2017  Allergen Reaction Noted  . Eggs or egg-derived products Nausea And Vomiting 10/06/2015  . Pineapple  12/06/2016    Past Medical History:  Diagnosis Date  . Anxiety   . Arthritis, rheumatoid (HCC)   . Chronic headaches   . Hypertension   . Urticaria     Past Surgical History:  Procedure Laterality Date  . NO PAST SURGERIES      Family History  Problem Relation Age of Onset  . Diabetes Mother   . Hypertension Mother   . Stomach cancer Neg Hx   . Colon cancer Neg Hx     Social History   Social History  . Marital status: Single    Spouse name: N/A  . Number of children: 2  . Years of education: N/A   Occupational History  . Not on file.   Social History Main Topics  . Smoking status: Never Smoker  . Smokeless tobacco: Never Used  . Alcohol use No  . Drug use: No  . Sexual activity: Yes    Birth control/ protection: Condom  Other Topics Concern  . Not on file   Social History Narrative   Completed 2nd grade.       Review of systems: Review of Systems  Constitutional: Negative for fever and chills.  HENT: Negative.   Eyes: Negative for blurred vision.  Respiratory: Negative for cough, shortness of breath and wheezing.   Cardiovascular: Negative for chest pain and palpitations.  Gastrointestinal: as per HPI Genitourinary: Negative for dysuria, urgency, frequency and hematuria.  Musculoskeletal: Positive for myalgias, back pain and joint pain.  Skin: Negative for itching and  rash.  Neurological: Negative for dizziness, tremors, focal weakness, seizures and loss of consciousness.  Endo/Heme/Allergies: Negative for seasonal allergies.  Psychiatric/Behavioral: Negative for depression, suicidal ideas and hallucinations.  All other systems reviewed and are negative.   Physical Exam: Vitals:   01/13/17 0955  BP: 104/70  Pulse: 80   Body mass index is 31.58 kg/m. Gen:      No acute distress HEENT:  EOMI, sclera anicteric Neck:     No masses; no thyromegaly Lungs:    Clear to auscultation bilaterally; normal respiratory effort CV:         Regular rate and rhythm; no murmurs Abd:      + bowel sounds; soft, non-tender; no palpable masses, no distension Ext:    No edema; adequate peripheral perfusion Skin:      Warm and dry; no rash Neuro: alert and oriented x 3 Psych: normal mood and affect  Data Reviewed:  Reviewed labs, radiology imaging, old records and pertinent past GI work up   Assessment and Plan/Recommendations:  36 year old female with rheumatoid arthritis, obesity here with complaints of chronic intermittent nausea, vomiting and upper abdominal pain.  Upper abdominal pain, nausea and vomiting: Intermittent symptoms and unclear etiology Workup so far unrevealing for any significant pathology Weight is stable Likely functional symptoms FD Gard 1-2 capsules as needed 3 times daily  Continue omeprazole 20 mg daily, 30 minutes before breakfast for GERD Ranitidine 150 mg at bedtime as needed Continue antireflux measures, discussed in detail  Diarrhea for past 7-10 days: We'll check GI pathogen panel to exclude infectious etiology  Return in 4-6 weeks or sooner if needed   Iona Beard , MD [phone] Mon-Fri 8a-5p [phone] after 5p, weekends, holidays  CC: Hairston, Mandesia R, F*

## 2017-01-17 NOTE — Telephone Encounter (Signed)
CMA return patient call regarding lab results  Patient Verify DOB  Patient was aware and understood

## 2017-01-17 NOTE — Telephone Encounter (Signed)
PT  Called back to get the lab result, please follow up thanks

## 2017-01-18 LAB — GASTROINTESTINAL PATHOGEN PANEL PCR
C. DIFFICILE TOX A/B, PCR: NOT DETECTED
CRYPTOSPORIDIUM, PCR: NOT DETECTED
Campylobacter, PCR: NOT DETECTED
E COLI (ETEC) LT/ST, PCR: NOT DETECTED
E coli (STEC) stx1/stx2, PCR: NOT DETECTED
E coli 0157, PCR: NOT DETECTED
Giardia lamblia, PCR: NOT DETECTED
Norovirus, PCR: NOT DETECTED
Rotavirus A, PCR: NOT DETECTED
Salmonella, PCR: NOT DETECTED
Shigella, PCR: NOT DETECTED

## 2017-01-26 ENCOUNTER — Encounter (HOSPITAL_COMMUNITY): Payer: Self-pay | Admitting: Family Medicine

## 2017-01-26 ENCOUNTER — Ambulatory Visit (HOSPITAL_COMMUNITY)
Admission: EM | Admit: 2017-01-26 | Discharge: 2017-01-26 | Disposition: A | Discharge status: Home / Self Care | Payer: Self-pay | Attending: Family Medicine | Admitting: Family Medicine

## 2017-01-26 ENCOUNTER — Ambulatory Visit: Payer: Self-pay | Admitting: Allergy and Immunology

## 2017-01-26 DIAGNOSIS — M069 Rheumatoid arthritis, unspecified: Secondary | ICD-10-CM

## 2017-01-26 LAB — CELIAC PANEL 10
ANTIGLIADIN ABS, IGA: 11 U (ref 0–19)
ENDOMYSIAL IGA: NEGATIVE
GLIADIN IGG: 2 U (ref 0–19)
IgA/Immunoglobulin A, Serum: 333 mg/dL (ref 87–352)
TISSUE TRANSGLUT AB: 3 U/mL (ref 0–5)

## 2017-01-26 LAB — ALPHA-GAL PANEL
Beef (Bos spp) IgE: <0.1 kU/L (ref <0.35)
Class Interpretation: 0
LAMB CLASS INTERPRETATION: 0
PORK CLASS INTERPRETATION: 0

## 2017-01-26 LAB — THYROID PEROXIDASE ANTIBODY: Thyroperoxidase Ab SerPl-aCnc: 12 IU/mL (ref 0–34)

## 2017-01-26 LAB — C-REACTIVE PROTEIN: CRP: 26.8 mg/L — ABNORMAL HIGH (ref 0.0–4.9)

## 2017-01-26 LAB — T4, FREE: Free T4: 1.37 ng/dL (ref 0.82–1.77)

## 2017-01-26 LAB — TSH: TSH: 3.69 u[IU]/mL (ref 0.450–4.500)

## 2017-01-26 LAB — SEDIMENTATION RATE: Sed Rate: 32 mm/hr (ref 0–32)

## 2017-01-26 MED ORDER — PREDNISONE 20 MG PO TABS: ORAL_TABLET | ORAL | 0 refills | Status: DC

## 2017-01-26 MED ORDER — KETOROLAC TROMETHAMINE 60 MG/2ML IM SOLN
60.0000 mg | Freq: Once | INTRAMUSCULAR | Status: AC
  Administered 2017-01-26: 60 mg via INTRAMUSCULAR

## 2017-01-26 MED ORDER — KETOROLAC TROMETHAMINE 60 MG/2ML IM SOLN
INTRAMUSCULAR | Status: AC
  Filled 2017-01-26: qty 2

## 2017-01-26 NOTE — Discharge Instructions (Signed)
Favor de hacer una cita con su medico.

## 2017-01-26 NOTE — ED Provider Notes (Signed)
[facility]    CSN: [id number] Arrival date & time: 01/26/17  1708     History   Chief Complaint Chief Complaint  Patient presents with  . Arthritis    HPI Karina Murphy is a 36 y.o. female.   This is a Hispanic woman who works at [employer] and has a history of rheumatoid arthritis. The last 2 weeks she's had increasing pain and over the last 2 days the pain is been quite intense. She has swelling in all of her digits bilaterally as well as ear pain bilaterally.      Past Medical History:  Diagnosis Date  . Anxiety   . Arthritis, rheumatoid (HCC)   . Chronic headaches   . Hypertension   . Urticaria     Patient Active Problem List   Diagnosis Date Noted  . Weakness 11/23/2016  . Breast pain, left 09/17/2016  . Abdominal pain, epigastric 07/27/2016  . Rheumatoid arthritis (HCC) 02/12/2014  . Obese 12/26/2013    Past Surgical History:  Procedure Laterality Date  . NO PAST SURGERIES      OB History    No data available       Home Medications    Prior to Admission medications   Medication Sig Start Date End Date Taking? Authorizing Provider  cetirizine (ZYRTEC) 10 MG tablet Take one tablet by mouth twice daily as directed. 01/12/17  Yes Kozlow, Alvira Philips, MD  diclofenac (VOLTAREN) 75 MG EC tablet Take 75 mg by mouth 2 (two) times daily.   Yes [provider]  ferrous sulfate 325 (65 FE) MG tablet Take 1 tablet (325 mg total) by mouth daily with breakfast. 12/06/16  Yes Hairston, Mandesia R, FNP  montelukast (SINGULAIR) 10 MG tablet Take 1 tablet (10 mg total) by mouth daily. 01/12/17  Yes Kozlow, Alvira Philips, MD  escitalopram (LEXAPRO) 10 MG tablet Take 1 tablet (10 mg total) by mouth daily. After 4 weeks may increase to 2 tablets ( 20 mg total) by mouth daily. 01/06/17   Lizbeth Bark, FNP  fexofenadine (ALLEGRA ALLERGY) 180 MG tablet Take 1 tablet (180 mg total) by mouth daily. 12/06/16   Lizbeth Bark, FNP  omeprazole  (PRILOSEC) 20 MG capsule Take 1 capsule (20 mg total) by mouth daily. 30 minutes before breakfast 01/13/17   Napoleon Form, MD  predniSONE (DELTASONE) 20 MG tablet Two daily with food 01/26/17   Elvina Sidle, MD  promethazine (PHENERGAN) 25 MG tablet Take 1 tablet (25 mg total) by mouth every 8 (eight) hours as needed for nausea or vomiting. 11/19/16   Funches, Gerilyn Nestle, MD  ranitidine (ZANTAC) 150 MG tablet Take 1 tablet (150 mg total) by mouth at bedtime. 01/13/17   Napoleon Form, MD  triamcinolone (KENALOG) 0.025 % cream Apply 1 application topically 2 (two) times daily. 12/06/16   Lizbeth Bark, FNP    Family History Family History  Problem Relation Age of Onset  . Diabetes Mother   . Hypertension Mother   . Stomach cancer Neg Hx   . Colon cancer Neg Hx     Social History Social History  Substance Use Topics  . Smoking status: Never Smoker  . Smokeless tobacco: Never Used  . Alcohol use No     Allergies   Eggs or egg-derived products and Pineapple   Review of Systems Review of Systems  HENT: Positive for ear pain.   Musculoskeletal: Positive for joint swelling.  All other systems reviewed and are negative.  Physical Exam Triage Vital Signs ED Triage Vitals  Enc Vitals Group     BP      Pulse      Resp      Temp      Temp src      SpO2      Weight      Height      Head Circumference      Peak Flow      Pain Score      Pain Loc      Pain Edu?      Excl. in GC?    No data found.   Updated Vital Signs BP 122/74 (BP Location: Left Arm)   Pulse 84   Temp 99 F (37.2 C) (Oral)   Resp 20   LMP 12/31/2016   SpO2 100%    Physical Exam  Constitutional: She is oriented to person, place, and time. She appears well-developed and well-nourished.  HENT:  Right Ear: External ear normal.  Left Ear: External ear normal.  Mouth/Throat: Oropharynx is clear and moist.  Eyes: Conjunctivae and EOM are normal. Pupils are equal, round, and reactive  to light.  Neck: Normal range of motion. Neck supple.  Pulmonary/Chest: Effort normal.  Musculoskeletal:  Early swan-neck deformity of all digits bilaterally as well as synovial thickening over the MCP joints of both hands.  Neurological: She is alert and oriented to person, place, and time.  Skin: Skin is warm and dry.  Nursing note and vitals reviewed.    UC Treatments / Results  Labs (all labs ordered are listed, but only abnormal results are displayed) Labs Reviewed - No data to display  EKG  EKG Interpretation None       Radiology No results found.  Procedures Procedures (including critical care time)  Medications Ordered in UC Medications  ketorolac (TORADOL) injection 60 mg (not administered)     Initial Impression / Assessment and Plan / UC Course  I have reviewed the triage vital signs and the nursing notes.  Pertinent labs & imaging results that were available during my care of the patient were reviewed by me and considered in my medical decision making (see chart for details).     Final Clinical Impressions(s) / UC Diagnoses   Final diagnoses:  Rheumatoid arthritis flare (HCC)    New Prescriptions New Prescriptions   PREDNISONE (DELTASONE) 20 MG TABLET    Two daily with food     Elvina Sidle, MD 01/26/17 1805

## 2017-01-26 NOTE — ED Triage Notes (Signed)
Pt reports she was dx w/Rhematoid Arthritis 2 years ago  Has not seen a rheumatologist in over a year   Pt c/o joint pain x2 weeks  Slow gait... A&O x4... NAD... Ambulatory

## 2017-02-04 ENCOUNTER — Ambulatory Visit: Payer: Self-pay

## 2017-02-04 MED FILL — predniSONE 20 MG TABS: 20 | 5 days supply | Qty: 10 | Fill #0

## 2017-02-04 MED FILL — ?DICLOFENAC SOD DR 75 MG TA: 75 | 30 days supply | Qty: 60 | Fill #1

## 2017-03-03 ENCOUNTER — Ambulatory Visit: Payer: Self-pay | Admitting: Gastroenterology

## 2017-03-03 ENCOUNTER — Ambulatory Visit: Payer: Self-pay | Attending: Family Medicine | Admitting: Family Medicine

## 2017-03-03 VITALS — BP 114/73 | HR 69 | Temp 98.3°F | Resp 18 | Ht 64.0 in | Wt 182.2 lb

## 2017-03-03 DIAGNOSIS — M069 Rheumatoid arthritis, unspecified: Secondary | ICD-10-CM | POA: Insufficient documentation

## 2017-03-03 DIAGNOSIS — F419 Anxiety disorder, unspecified: Secondary | ICD-10-CM | POA: Insufficient documentation

## 2017-03-03 DIAGNOSIS — K219 Gastro-esophageal reflux disease without esophagitis: Secondary | ICD-10-CM | POA: Insufficient documentation

## 2017-03-03 MED ORDER — TRAMADOL HCL 50 MG PO TABS: 50.0000 mg | ORAL_TABLET | Freq: Three times a day (TID) | ORAL | 0 refills | Status: DC | PRN

## 2017-03-03 MED ORDER — ESCITALOPRAM OXALATE 20 MG PO TABS: ORAL_TABLET | ORAL | 0 refills | Status: DC

## 2017-03-03 MED FILL — traMADol HCL 50 MG TABS: 50 | 13 days supply | Qty: 40 | Fill #0

## 2017-03-03 NOTE — Progress Notes (Signed)
Patient is here for f/up GERD  Patient needs referral to rheumatology   Patient has joint pains

## 2017-03-03 NOTE — Progress Notes (Signed)
Subjective:  Patient ID: Karina Murphy, female    DOB: 12/12/1980  Age: 36 y.o. MRN: [id number]  CC: Follow-up   HPI Adamaris Medrano-Lluck presents for  rheumatoid arthritis referral and GERD follow up.  Symptoms have been present for several years. Onset was gradual. Symptoms include joint pain, joint swelling, morning stiffness and weakness of hands and are  moderate to severe in  severity. Patient denies eye symptoms, numbness of hands, skin nodules and sleep difficulties. Symptoms are made worse by: movement and raising arm over head.  She reports taking NSAID's and steroids with minimal relief of symptoms. Reports pain level 9/10.  Associated symptoms include fatigue and muscle weakness. She reports relieve of GERD symptoms with use of omeprazole. Reports using only as needed. History of anxiety . Denies any SI/HI. Reports adherence with Lexapro, with significant improvement of anxiety symptoms.      Outpatient Medications Prior to Visit  Medication Sig Dispense Refill  . cetirizine (ZYRTEC) 10 MG tablet Take one tablet by mouth twice daily as directed. 60 tablet 5  . diclofenac (VOLTAREN) 75 MG EC tablet Take 75 mg by mouth 2 (two) times daily.    . ferrous sulfate 325 (65 FE) MG tablet Take 1 tablet (325 mg total) by mouth daily with breakfast. 30 tablet 2  . fexofenadine (ALLEGRA ALLERGY) 180 MG tablet Take 1 tablet (180 mg total) by mouth daily. 24 tablet 6  . montelukast (SINGULAIR) 10 MG tablet Take 1 tablet (10 mg total) by mouth daily. 30 tablet 5  . omeprazole (PRILOSEC) 20 MG capsule Take 1 capsule (20 mg total) by mouth daily. 30 minutes before breakfast 30 capsule 3  . predniSONE (DELTASONE) 20 MG tablet Two daily with food 10 tablet 0  . promethazine (PHENERGAN) 25 MG tablet Take 1 tablet (25 mg total) by mouth every 8 (eight) hours as needed for nausea or vomiting. 20 tablet 0  . ranitidine (ZANTAC) 150 MG tablet Take 1 tablet (150 mg total) by mouth at bedtime. 30  tablet 3  . triamcinolone (KENALOG) 0.025 % cream Apply 1 application topically 2 (two) times daily. 30 g 0  . escitalopram (LEXAPRO) 10 MG tablet Take 1 tablet (10 mg total) by mouth daily. After 4 weeks may increase to 2 tablets ( 20 mg total) by mouth daily. 90 tablet 0   Facility-Administered Medications Prior to Visit  Medication Dose Route Frequency Provider Last Rate Last Dose  . gi cocktail (Maalox,Lidocaine,Donnatal)  30 mL Oral Once Funches, Gerilyn Nestle, MD        ROS Review of Systems  Constitutional: Negative.   Eyes: Negative.   Respiratory: Negative.   Cardiovascular: Negative.   Musculoskeletal: Positive for arthralgias, joint swelling and myalgias.  Psychiatric/Behavioral: Negative for suicidal ideas.       History of anxiety   Objective:  BP 114/73 (BP Location: Left Arm, Patient Position: Sitting, Cuff Size: Normal)   Pulse 69   Temp 98.3 F (36.8 C) (Oral)   Resp 18   Ht 5\' 4"  (1.626 m)   Wt 182 lb 3.2 oz (82.6 kg)   SpO2 96%   BMI 31.27 kg/m   BP/Weight 03/03/2017 01/26/2017 01/13/2017  Systolic BP 114 122 104  Diastolic BP 73 74 70  Wt. (Lbs) 182.2 - 184  BMI 31.27 - 31.58     Physical Exam  Constitutional: She appears well-developed and well-nourished.  Eyes: Conjunctivae are normal. Pupils are equal, round, and reactive to light.  Neck: No JVD present.  Cardiovascular: Normal rate, regular rhythm, normal heart sounds and intact distal pulses.   Pulmonary/Chest: Effort normal and breath sounds normal.  Abdominal: Soft. Bowel sounds are normal. There is no tenderness.  Musculoskeletal:       Left shoulder: She exhibits decreased range of motion and pain.       Right hand: She exhibits tenderness and swelling (DIP/PIP joints). Decreased strength noted.       Left hand: She exhibits tenderness and swelling (DIP/PIP joints). Decreased strength noted.  Skin: Skin is warm and dry.  Nursing note and vitals reviewed.   Assessment & Plan:   Problem List  Items Addressed This Visit      Musculoskeletal and Integument   Rheumatoid arthritis (HCC) - Primary (Chronic)   Pt.is young and of childbearing age. Will hold initiation of DMARD until further evaluated by rheumatology.   Continue diclofenac use for pain and inflammation   Will add tramadol for severe pain.   Relevant Medications   traMADol (ULTRAM) 50 MG tablet   Other Relevant Orders   Ambulatory referral to Rheumatology    Other Visit Diagnoses    Anxiety       Relevant Medications   escitalopram (LEXAPRO) 20 MG tablet      Meds ordered this encounter  Medications  . traMADol (ULTRAM) 50 MG tablet    Sig: Take 1 tablet (50 mg total) by mouth every 8 (eight) hours as needed for severe pain.    Dispense:  40 tablet    Refill:  0    Order Specific Question:   Supervising Provider    Answer:   Quentin Angst [id number]  . DISCONTD: escitalopram (LEXAPRO) 20 MG tablet    Sig: TAKE ONE TABLET BY MOUTH DAILY.    Dispense:  90 tablet    Refill:  0    Order Specific Question:   Supervising Provider    Answer:   Quentin Angst [id number]  . escitalopram (LEXAPRO) 20 MG tablet    Sig: TAKE ONE TABLET BY MOUTH DAILY.    Dispense:  90 tablet    Refill:  0    Follow-up: Return in about 3 months (around 06/03/2017) for RA.   Lizbeth Bark FNP

## 2017-03-03 NOTE — Patient Instructions (Signed)
Artritis reumatoide (Rheumatoid Arthritis) La artritis reumatoide (AR) es una enfermedad a largo plazo (crnica) que causa inflamacin en las articulaciones. La AR puede comenzar lentamente. A menudo, afecta las articulaciones pequeas de las manos y los pies. Por lo general, se ven afectadas las mismas articulaciones en ambos lados del cuerpo. La inflamacin por AR tambin podra afectar otras partes del cuerpo, como el corazn, los ojos o los pulmones. La AR es una enfermedad autoinmunitaria. Esto significa que el sistema de defensa (sistema inmunitario) de su cuerpo ataca por error tejidos corporales sanos. La AR no tiene [place], Biomedical engineer existen medicamentos que pueden ayudar a Paramedic los sntomas y TEFL teacher o Scientist, research (medical) avance de la enfermedad. CAUSAS Se desconoce la causa exacta de la AR. FACTORES DE RIESGO Es ms probable que esta afeccin se manifieste en:  Mujeres.  Personas con antecedentes familiares de AR u otras enfermedades autoinmunitarias. SNTOMAS Los sntomas de esta afeccin varan de [place]omia Dear persona a [place]. A menudo, los sntomas empiezan de manera gradual. Suelen ser peor por la [place]. El primer sntoma podra ser entumecimiento matinal que se prolonga por ms de . A medida que la AR avanza, los sntomas pueden incluir los siguientes:  dolor, entumecimiento, hinchazn, sensacin de calor y dolor a la palpacin en las articulaciones de ambos lados del cuerpo.  Prdida de energa.  Prdida del apetito.  Prdida de peso.  Fiebre no muy elevada.  Ojos y boca secos.  Bultos firmes (ndulos reumatoideos) que crecen por debajo de la piel en zonas como los huesos de los antebrazos, cerca de los codos y [place].  Cambios en la apariencia de las articulaciones (deformidades) y prdida de la funcin articular. Los sntomas de la AR suelen ser intermitentes. En ocasiones, los sntomas empeoran durante un perodo de [place]. Estos perodos se denominan  crisis. DIAGNSTICO Esta afeccin se diagnostica en funcin de los sntomas, los antecedentes mdicos y un examen fsico. Puede realizarse radiografas o resonancias magnticas para ver el tipo de cambio en las articulaciones que la AR est ocasionando. Tambin pueden hacerle un anlisis de sangre para Engineer, manufacturing lo siguiente:  Protenas (anticuerpos) que su sistema inmunitario podra generar si padece AR. Estas incluyen factores reumatoides (FR) y anticuerpos antipptidos cclicos citrulinados. ? Cuando en los anlisis de sangre se detectan estas protenas, se dice que usted padece "artritis reumatoide seropositiva". ? Si en los anlisis de sangre no se detectan estas protenas, usted podra padecer "artritis reumatoide seronegativa".  Inflamacin en la sangre.  Recuento bajo de glbulos rojos (anemia). TRATAMIENTO Los [employer] del tratamiento son Engineer, materials, reducir la inflamacin y disminuir o Restaurant manager, fast food lesin en las articulaciones y la discapacidad. El tratamiento puede incluir lo siguiente:  Cambios en el estilo de vida. Es Armed forces training and education officer, llevar una dieta saludable y hacer ejercicio.  Medicamentos. El mdico puede ajustarle los medicamentos cada hasta tanto se alcancen los objetivos del [place]. Los medicamentos comunes [employer] siguientes: ? Analgsicos. ? Corticoides y antiinflamatorios no esteroides (AINE) que reducen la inflamacin. ? Antirreumticos modificadores de la enfermedad (ARME) para disminuir el avance de la artritis reumatoidea. ? Modificadores de respuesta biolgica para reducir la inflamacin y las lesiones.  [place] y [place] ocupacional.  [place], en caso de que tenga daos articulares graves. Puede ser necesario el reemplazo o fusin de las articulaciones. El mdico trabajar con usted para Clinical research associate la opcin de tratamiento ms [place] para su caso en funcin de una evaluacin de la actividad general de la enfermedad en su  organismo.  INSTRUCCIONES PARA EL CUIDADO EN EL HOGAR  Tome los medicamentos de venta libre y los recetados solamente como se lo haya indicado el mdico.  Siga el programa de ejercicios tal como se lo haya indicado el mdico.  Descanse cuando tenga una crisis.  Reanude sus actividades normales como se lo haya indicado el mdico. Pregntele al mdico qu actividades son seguras para usted.  Concurra a todas las visitas de control como se lo haya indicado el mdico. Esto es importante.  SOLICITE ATENCIN MDICA SI:  [place] crisis de sntomas de AR.  Tiene fiebre.  Los [employer] causan efectos secundarios.  SOLICITE ATENCIN MDICA DE INMEDIATO SI:  Siente dolor en el pecho.  Tiene dificultad para respirar.  Desarrolla rpidamente calor y dolor en una articulacin, que son ms intensos que los dolores articulares habituales.  PARA OBTENER MS INFORMACIN  [employer] developer of Rheumatology): www.rheumatology.org.  Fundacin contra la Artritis (Arthritis Foundation): www.arthritis.org  Esta informacin no tiene Theme park manager el consejo del mdico. Asegrese de hacerle al mdico cualquier pregunta que tenga. Document Released: 06/09/2005 Document Revised: 12/22/2015 Document Reviewed: 06/12/2015 Elsevier Interactive Patient Education  2017 [employer].

## 2017-04-04 ENCOUNTER — Telehealth: Payer: Self-pay | Admitting: Family Medicine

## 2017-04-04 NOTE — Telephone Encounter (Signed)
Pt came in to advise PCP that Rheumotologist denied her referral and she would like to know she may be referred to another specialist, stating that she is experiencing difficulty in walking. Also requests a script for sulfasalazine for her arthritis. States that she was given that med before and it helped. Please f/u. Thank you.

## 2017-04-07 ENCOUNTER — Other Ambulatory Visit: Payer: Self-pay | Admitting: Family Medicine

## 2017-04-07 DIAGNOSIS — M069 Rheumatoid arthritis, unspecified: Secondary | ICD-10-CM

## 2017-04-07 MED ORDER — TRAMADOL HCL 50 MG PO TABS: 50.0000 mg | ORAL_TABLET | Freq: Three times a day (TID) | ORAL | 0 refills | Status: DC | PRN

## 2017-04-07 MED ORDER — NAPROXEN 500 MG PO TABS: ORAL_TABLET | ORAL | 0 refills | Status: DC

## 2017-04-07 MED FILL — traMADol HCL 50 MG TABS: 50 | 13 days supply | Qty: 40 | Fill #0

## 2017-04-07 NOTE — Telephone Encounter (Signed)
Okay thank you, Arna Medici.

## 2017-04-07 NOTE — Telephone Encounter (Signed)
CMA call regarding prescription for tramadol is ready to pick up at front desk   Patient verify DOB  Patient was aware and understood

## 2017-04-07 NOTE — Telephone Encounter (Signed)
Gm The referral was sent to Dr Corliss Skains  But she denied the referral no reason in the comment and we don't have more  resources in the network  She will have to pay out of her pocket $150and she can be refer to  [facility] in [facility] .

## 2017-04-07 NOTE — Telephone Encounter (Signed)
Will follow up with referral specialist regarding rheumatology referral denial. Prescription for sulfasalazine will not be given. Would recommend starting methotrexate instead, however she would need to schedule office visit to evaluate. I will refill prescriptions for tramadol place prescription for naprosyn to help with pain and inflammation. Prescription script for tramadol will be available today.

## 2017-06-16 ENCOUNTER — Emergency Department (HOSPITAL_COMMUNITY): Payer: Self-pay

## 2017-06-16 ENCOUNTER — Ambulatory Visit: Payer: Self-pay | Attending: Family Medicine | Admitting: Physician Assistant

## 2017-06-16 ENCOUNTER — Emergency Department (HOSPITAL_COMMUNITY)
Admission: EM | Admit: 2017-06-16 | Discharge: 2017-06-17 | Disposition: A | Discharge status: Home / Self Care | Payer: Self-pay | Attending: Emergency Medicine | Admitting: Emergency Medicine

## 2017-06-16 ENCOUNTER — Encounter (HOSPITAL_COMMUNITY): Payer: Self-pay | Admitting: Emergency Medicine

## 2017-06-16 ENCOUNTER — Encounter: Payer: Self-pay | Admitting: Physician Assistant

## 2017-06-16 VITALS — BP 138/83 | HR 89 | Temp 98.8°F | Resp 18 | Ht 64.0 in | Wt 186.0 lb

## 2017-06-16 DIAGNOSIS — R1011 Right upper quadrant pain: Secondary | ICD-10-CM | POA: Insufficient documentation

## 2017-06-16 DIAGNOSIS — Z91012 Allergy to eggs: Secondary | ICD-10-CM | POA: Insufficient documentation

## 2017-06-16 DIAGNOSIS — Z79891 Long term (current) use of opiate analgesic: Secondary | ICD-10-CM | POA: Insufficient documentation

## 2017-06-16 DIAGNOSIS — R112 Nausea with vomiting, unspecified: Secondary | ICD-10-CM

## 2017-06-16 DIAGNOSIS — M069 Rheumatoid arthritis, unspecified: Secondary | ICD-10-CM | POA: Insufficient documentation

## 2017-06-16 DIAGNOSIS — I10 Essential (primary) hypertension: Secondary | ICD-10-CM | POA: Insufficient documentation

## 2017-06-16 DIAGNOSIS — Z91018 Allergy to other foods: Secondary | ICD-10-CM | POA: Insufficient documentation

## 2017-06-16 DIAGNOSIS — F419 Anxiety disorder, unspecified: Secondary | ICD-10-CM | POA: Insufficient documentation

## 2017-06-16 DIAGNOSIS — Z79899 Other long term (current) drug therapy: Secondary | ICD-10-CM | POA: Insufficient documentation

## 2017-06-16 DIAGNOSIS — R1033 Periumbilical pain: Secondary | ICD-10-CM | POA: Insufficient documentation

## 2017-06-16 DIAGNOSIS — R42 Dizziness and giddiness: Secondary | ICD-10-CM

## 2017-06-16 DIAGNOSIS — R509 Fever, unspecified: Secondary | ICD-10-CM

## 2017-06-16 DIAGNOSIS — Z7952 Long term (current) use of systemic steroids: Secondary | ICD-10-CM | POA: Insufficient documentation

## 2017-06-16 DIAGNOSIS — R1013 Epigastric pain: Secondary | ICD-10-CM | POA: Insufficient documentation

## 2017-06-16 LAB — URINALYSIS, ROUTINE W REFLEX MICROSCOPIC
Bacteria, UA: NONE SEEN
Bilirubin Urine: NEGATIVE
Glucose, UA: NEGATIVE mg/dL
Ketones, ur: NEGATIVE mg/dL
LEUKOCYTES UA: NEGATIVE
NITRITE: NEGATIVE
PH: 6 (ref 5.0–8.0)
Protein, ur: NEGATIVE mg/dL
SPECIFIC GRAVITY, URINE: 1.01 (ref 1.005–1.030)

## 2017-06-16 LAB — POC URINE PREG, ED: Preg Test, Ur: NEGATIVE

## 2017-06-16 LAB — CBC
HCT: 36.5 % (ref 36.0–46.0)
Hemoglobin: 11.8 g/dL — ABNORMAL LOW (ref 12.0–15.0)
MCH: 28.1 pg (ref 26.0–34.0)
MCHC: 32.3 g/dL (ref 30.0–36.0)
MCV: 86.9 fL (ref 78.0–100.0)
PLATELETS: 331 10*3/uL (ref 150–400)
RBC: 4.2 MIL/uL (ref 3.87–5.11)
RDW: 14.1 % (ref 11.5–15.5)
WBC: 7.9 10*3/uL (ref 4.0–10.5)

## 2017-06-16 LAB — LIPASE, BLOOD: Lipase: 26 U/L (ref 11–51)

## 2017-06-16 LAB — COMPREHENSIVE METABOLIC PANEL
ALK PHOS: 61 U/L (ref 38–126)
ALT: 13 U/L — AB (ref 14–54)
AST: 16 U/L (ref 15–41)
Albumin: 3.8 g/dL (ref 3.5–5.0)
Anion gap: 8 (ref 5–15)
BILIRUBIN TOTAL: 0.8 mg/dL (ref 0.3–1.2)
BUN: 9 mg/dL (ref 6–20)
CALCIUM: 9.3 mg/dL (ref 8.9–10.3)
CO2: 25 mmol/L (ref 22–32)
CREATININE: 0.84 mg/dL (ref 0.44–1.00)
Chloride: 104 mmol/L (ref 101–111)
GFR calc Af Amer: >60 mL/min (ref >60)
Glucose, Bld: 105 mg/dL — ABNORMAL HIGH (ref 65–99)
Potassium: 3.6 mmol/L (ref 3.5–5.1)
Sodium: 137 mmol/L (ref 135–145)
TOTAL PROTEIN: 7.6 g/dL (ref 6.5–8.1)

## 2017-06-16 LAB — I-STAT CG4 LACTIC ACID, ED: LACTIC ACID, VENOUS: 0.83 mmol/L (ref 0.5–1.9)

## 2017-06-16 MED ORDER — DICYCLOMINE HCL 20 MG PO TABS: 20.0000 mg | ORAL_TABLET | Freq: Two times a day (BID) | ORAL | 0 refills | Status: DC

## 2017-06-16 MED ORDER — ONDANSETRON HCL 4 MG/2ML IJ SOLN
4.0000 mg | Freq: Once | INTRAMUSCULAR | Status: AC
  Administered 2017-06-16: 4 mg via INTRAVENOUS
  Filled 2017-06-16: qty 2

## 2017-06-16 MED ORDER — IOPAMIDOL (ISOVUE-300) INJECTION 61%
INTRAVENOUS | Status: AC
  Administered 2017-06-16: 100 mL
  Filled 2017-06-16: qty 100

## 2017-06-16 MED ORDER — MORPHINE SULFATE (PF) 4 MG/ML IV SOLN
4.0000 mg | Freq: Once | INTRAVENOUS | Status: DC
  Filled 2017-06-16: qty 1

## 2017-06-16 MED ORDER — FENTANYL CITRATE (PF) 100 MCG/2ML IJ SOLN
50.0000 ug | Freq: Once | INTRAMUSCULAR | Status: AC
  Administered 2017-06-16: 50 ug via INTRAVENOUS
  Filled 2017-06-16: qty 2

## 2017-06-16 MED ORDER — ONDANSETRON 4 MG PO TBDP: 4.0000 mg | ORAL_TABLET | Freq: Three times a day (TID) | ORAL | 0 refills | Status: DC | PRN

## 2017-06-16 MED ORDER — SODIUM CHLORIDE 0.9 % IV BOLUS (SEPSIS)
1000.0000 mL | Freq: Once | INTRAVENOUS | Status: AC
  Administered 2017-06-16: 1000 mL via INTRAVENOUS

## 2017-06-16 NOTE — ED Provider Notes (Signed)
[facility]-EMERGENCY DEPT Provider Note   CSN: [id number] Arrival date & time: 06/16/17  1654     History   Chief Complaint Chief Complaint  Patient presents with  . Abdominal Pain    HPI Karina Murphy is a 36 y.o. female.  HPI   36 year old female with history of hypertension, chronic headache, anxiety presenting to the ED with complaints of abdominal pain. Hx obtain through language interpreter. Patient report for the past 3-4 days she has had persistent right upper quadrant abdominal pain. She described pain as a sharp sensation occasionally radiates to her back, worse with eating. She has endorsed having several episodes of nonbloody nonbilious vomiting, and today having some loose stools. She endorses subjective fever and chills and postprandial pain. She has never had this kind of pain before. No report of headache, lightheadedness, dizziness, chest pain, shortness of breath unless the pain is intense, productive cough or hemoptysis. She does report urinary frequency without burning urination. She denies history of diabetes, or alcohol abuse. No prior abdominal surgery. She is a nonsmoker.  Past Medical History:  Diagnosis Date  . Anxiety   . Arthritis, rheumatoid (HCC)   . Chronic headaches   . Hypertension   . Urticaria     Patient Active Problem List   Diagnosis Date Noted  . Weakness 11/23/2016  . Breast pain, left 09/17/2016  . Abdominal pain, epigastric 07/27/2016  . Rheumatoid arthritis (HCC) 02/12/2014  . Obese 12/26/2013    Past Surgical History:  Procedure Laterality Date  . NO PAST SURGERIES      OB History    No data available       Home Medications    Prior to Admission medications   Medication Sig Start Date End Date Taking? Authorizing Provider  cetirizine (ZYRTEC) 10 MG tablet Take one tablet by mouth twice daily as directed. 01/12/17   Kozlow, Alvira Philips, MD  diclofenac (VOLTAREN) 75 MG EC tablet Take 75 mg by mouth 2 (two) times daily.     [provider]  escitalopram (LEXAPRO) 20 MG tablet TAKE ONE TABLET BY MOUTH DAILY. 03/03/17   Lizbeth Bark, FNP  ferrous sulfate 325 (65 FE) MG tablet Take 1 tablet (325 mg total) by mouth daily with breakfast. 12/06/16   Lizbeth Bark, FNP  fexofenadine (ALLEGRA ALLERGY) 180 MG tablet Take 1 tablet (180 mg total) by mouth daily. 12/06/16   Lizbeth Bark, FNP  montelukast (SINGULAIR) 10 MG tablet Take 1 tablet (10 mg total) by mouth daily. 01/12/17   Kozlow, Alvira Philips, MD  naproxen (NAPROSYN) 500 MG tablet TAKE ONE TABLET BY MOUTH TWICE A DAY WITH MEALS FOR 10 DAYS. THEN ONE TABLET BY MOUTH ONCE A DAY WITH MEALS DAILY AS NEEDED. 04/07/17   Lizbeth Bark, FNP  omeprazole (PRILOSEC) 20 MG capsule Take 1 capsule (20 mg total) by mouth daily. 30 minutes before breakfast 01/13/17   Napoleon Form, MD  predniSONE (DELTASONE) 20 MG tablet Two daily with food 01/26/17   Elvina Sidle, MD  promethazine (PHENERGAN) 25 MG tablet Take 1 tablet (25 mg total) by mouth every 8 (eight) hours as needed for nausea or vomiting. 11/19/16   Funches, Gerilyn Nestle, MD  ranitidine (ZANTAC) 150 MG tablet Take 1 tablet (150 mg total) by mouth at bedtime. 01/13/17   Napoleon Form, MD  traMADol (ULTRAM) 50 MG tablet Take 1 tablet (50 mg total) by mouth every 8 (eight) hours as needed for severe pain. 04/07/17   Arrie Senate  R, FNP  triamcinolone (KENALOG) 0.025 % cream Apply 1 application topically 2 (two) times daily. 12/06/16   Lizbeth Bark, FNP    Family History Family History  Problem Relation Age of Onset  . Diabetes Mother   . Hypertension Mother   . Stomach cancer Neg Hx   . Colon cancer Neg Hx     Social History Social History  Substance Use Topics  . Smoking status: Never Smoker  . Smokeless tobacco: Never Used  . Alcohol use No     Allergies   Eggs or egg-derived products and Pineapple   Review of Systems Review of Systems  All other systems reviewed  and are negative.    Physical Exam Updated Vital Signs BP 127/78 (BP Location: Left Arm)   Pulse 88   Temp 98.5 F (36.9 C) (Oral)   Resp 18   LMP 06/16/2017   SpO2 99%   Physical Exam  Constitutional: She is oriented to person, place, and time. She appears well-developed and well-nourished. No distress.  Obese female appears uncomfortable but nontoxic  HENT:  Head: Atraumatic.  Eyes: Conjunctivae are normal.  Neck: Neck supple.  Cardiovascular: Normal rate and regular rhythm.   Pulmonary/Chest: Effort normal and breath sounds normal. No respiratory distress. She has no wheezes.  Abdominal: Soft. Bowel sounds are normal. She exhibits no distension. There is tenderness (Tenderness to right upper quadrant with positive Murphy sign, no pain at McBurney's point.).  Genitourinary:  Genitourinary Comments: No CVA tenderness  Neurological: She is alert and oriented to person, place, and time.  Skin: No rash noted.  Psychiatric: She has a normal mood and affect.  Nursing note and vitals reviewed.    ED Treatments / Results  Labs (all labs ordered are listed, but only abnormal results are displayed) Labs Reviewed  COMPREHENSIVE METABOLIC PANEL - Abnormal; Notable for the following:       Result Value   Glucose, Bld 105 (*)    ALT 13 (*)    All other components within normal limits  CBC - Abnormal; Notable for the following:    Hemoglobin 11.8 (*)    All other components within normal limits  URINALYSIS, ROUTINE W REFLEX MICROSCOPIC - Abnormal; Notable for the following:    Color, Urine STRAW (*)    Hgb urine dipstick MODERATE (*)    Squamous Epithelial / LPF 0-5 (*)    All other components within normal limits  LIPASE, BLOOD  POC URINE PREG, ED  I-STAT CG4 LACTIC ACID, ED    EKG  EKG Interpretation None       Radiology No results found.  Procedures Procedures (including critical care time)  Medications Ordered in ED Medications - No data to  display   Initial Impression / Assessment and Plan / ED Course  I have reviewed the triage vital signs and the nursing notes.  Pertinent labs & imaging results that were available during my care of the patient were reviewed by me and considered in my medical decision making (see chart for details).     BP 127/78 (BP Location: Left Arm)   Pulse 88   Temp 98.5 F (36.9 C) (Oral)   Resp 18   LMP 06/16/2017   SpO2 99%    Final Clinical Impressions(s) / ED Diagnoses   Final diagnoses:  RUQ abdominal pain  Periumbilical abdominal pain    New Prescriptions Discharge Medication List as of 06/16/2017 11:37 PM    START taking these medications   Details  dicyclomine (BENTYL) 20 MG tablet Take 1 tablet (20 mg total) by mouth 2 (two) times daily., Starting Thu 06/16/2017, Print    ondansetron (ZOFRAN ODT) 4 MG disintegrating tablet Take 1 tablet (4 mg total) by mouth every 8 (eight) hours as needed for nausea or vomiting., Starting Thu 06/16/2017, Print       8:08 PM Patient with right upper quadrant abdominal pain and postprandial pain concerning for biliary disease. She was sent here from her PCP office for further evaluation. She will benefit from an abdominal ultrasound for further care.  PregnancytTest is negative, normal lactic acid, urine shows moderate hemoglobin on urine dipsticks however patient currently on her menstruation. Doubt kidney stone. Normal lipase, electrolytes panel are reassuring, normal WBC.  8:31 PM Pt sign out to oncoming provider who will f/u on abd [place] result.  If negative and pt still endorse significant pain, she may benefit from an abd/pelvis CT scan for further evaluation.     Fayrene Helper, PA-C 06/17/17 1129    Arby Barrette, MD 06/21/17 [id number]

## 2017-06-16 NOTE — Discharge Instructions (Signed)
Please read and follow all provided instructions.  Your diagnoses today include:  1. Periumbilical abdominal pain   2. RUQ abdominal pain     Tests performed today include:  Blood counts and electrolytes  Blood tests to check liver and kidney function  Blood tests to check pancreas function  Urine test to look for infection and pregnancy (in women)  Ultrasound - shows some sludge in gallbladder but no gallstones  CT scan - no major problems in your abdomen  Vital signs. See below for your results today.   Medications prescribed:    Bentyl - medication for intestinal cramps and spasms   Zofran (ondansetron) - for nausea and vomiting  Take any prescribed medications only as directed.  Home care instructions:   Follow any educational materials contained in this packet.  Follow-up instructions: Please follow-up with your primary care provider in the next 3 days for further evaluation of your symptoms.    Return instructions:  SEEK IMMEDIATE MEDICAL ATTENTION IF:  The pain does not go away or becomes severe   A temperature above 101F develops   Repeated vomiting occurs (multiple episodes)   The pain becomes localized to portions of the abdomen. The right side could possibly be appendicitis. In an adult, the left lower portion of the abdomen could be colitis or diverticulitis.   Blood is being passed in stools or vomit (bright red or black tarry stools)   You develop chest pain, difficulty breathing, dizziness or fainting, or become confused, poorly responsive, or inconsolable (young children)  If you have any other emergent concerns regarding your health  Additional Information: Abdominal (belly) pain can be caused by many things. Your caregiver performed an examination and possibly ordered blood/urine tests and imaging (CT scan, x-rays, ultrasound). Many cases can be observed and treated at home after initial evaluation in the emergency department. Even though you  are being discharged home, abdominal pain can be unpredictable. Therefore, you need a repeated exam if your pain does not resolve, returns, or worsens. Most patients with abdominal pain don't have to be admitted to the hospital or have surgery, but serious problems like appendicitis and gallbladder attacks can start out as nonspecific pain. Many abdominal conditions cannot be diagnosed in one visit, so follow-up evaluations are very important.  Your vital signs today were: BP 129/88    Pulse 87    Temp 98.5 F (36.9 C) (Oral)    Resp 18    LMP 06/16/2017    SpO2 100%  If your blood pressure (bp) was elevated above 135/85 this visit, please have this repeated by your doctor within one month. --------------

## 2017-06-16 NOTE — Progress Notes (Signed)
Patient ID: Karina Murphy, female   DOB: [birth year], 36 y.o.   MRN: [id number]   Karina Murphy, is a 36 y.o. female  XLK:[id number]  [id number]  DOB - Jan 13, 1981  Subjective:  Chief Complaint and HPI: Karina Murphy is a 36 y.o. female here today for abdominal pain, fever and vomiting X 3days.  vomiting3-10 times daily.  +loose BM yesterday.  No recent travel.  Temps ~101.  Some dizziness for 2 weeks prior to onset of abdominal pain.  Feels weak.  Pain is getting worse.  +urinary frequency w/o dysuria.  No h/o abdominal surgeries. RUQ pain/ mid-epigastric pain worsened 2 days ago.  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  No pelvic pain.  No vaginal discharge.     ROS:   Constitutional:  No f/c, No night sweats, No unexplained weight loss. EENT:  No vision changes, No blurry vision, No hearing changes. No mouth, throat, or ear problems.  Respiratory: No cough, No SOB Cardiac: No CP, no palpitations GI:  + abd pain, + N/V, some loose BM. GU: No Urinary s/sx Musculoskeletal: No joint pain Neuro: No headache, no dizziness, no motor weakness.  Skin: No rash Endocrine:  No polydipsia. No polyuria.  Psych: Denies SI/HI  No problems updated.  ALLERGIES: Allergies  Allergen Reactions  . Eggs Or Egg-Derived Products Nausea And Vomiting    Nausea/vomitting only with whole eggs, no problem with eggs used in ingredients  . Pineapple     PAST MEDICAL HISTORY: Past Medical History:  Diagnosis Date  . Anxiety   . Arthritis, rheumatoid (HCC)   . Chronic headaches   . Hypertension   . Urticaria     MEDICATIONS AT HOME: Prior to Admission medications   Medication Sig Start Date End Date Taking? Authorizing Provider  cetirizine (ZYRTEC) 10 MG tablet Take one tablet by mouth twice daily as directed. 01/12/17   Kozlow, Alvira Philips, MD  diclofenac (VOLTAREN) 75 MG EC  tablet Take 75 mg by mouth 2 (two) times daily.    [provider]  escitalopram (LEXAPRO) 20 MG tablet TAKE ONE TABLET BY MOUTH DAILY. 03/03/17   Lizbeth Bark, FNP  ferrous sulfate 325 (65 FE) MG tablet Take 1 tablet (325 mg total) by mouth daily with breakfast. 12/06/16   Lizbeth Bark, FNP  fexofenadine (ALLEGRA ALLERGY) 180 MG tablet Take 1 tablet (180 mg total) by mouth daily. 12/06/16   Lizbeth Bark, FNP  montelukast (SINGULAIR) 10 MG tablet Take 1 tablet (10 mg total) by mouth daily. 01/12/17   Kozlow, Alvira Philips, MD  naproxen (NAPROSYN) 500 MG tablet TAKE ONE TABLET BY MOUTH TWICE A DAY WITH MEALS FOR 10 DAYS. THEN ONE TABLET BY MOUTH ONCE A DAY WITH MEALS DAILY AS NEEDED. 04/07/17   Lizbeth Bark, FNP  omeprazole (PRILOSEC) 20 MG capsule Take 1 capsule (20 mg total) by mouth daily. 30 minutes before breakfast 01/13/17   Napoleon Form, MD  predniSONE (DELTASONE) 20 MG tablet Two daily with food 01/26/17   Elvina Sidle, MD  promethazine (PHENERGAN) 25 MG tablet Take 1 tablet (25 mg total) by mouth every 8 (eight) hours as needed for nausea or vomiting. 11/19/16   Funches, Gerilyn Nestle, MD  ranitidine (ZANTAC) 150 MG tablet Take 1 tablet (150 mg total) by mouth at bedtime. 01/13/17   Napoleon Form, MD  traMADol (ULTRAM) 50 MG tablet Take  1 tablet (50 mg total) by mouth every 8 (eight) hours as needed for severe pain. 04/07/17   Lizbeth Bark, FNP  triamcinolone (KENALOG) 0.025 % cream Apply 1 application topically 2 (two) times daily. 12/06/16   Lizbeth Bark, FNP     Objective:  EXAM:   Vitals:   06/16/17 1555  BP: 138/83  Pulse: 89  Resp: 18  Temp: 98.8 F (37.1 C)  TempSrc: Oral  SpO2: 98%  Weight: 186 lb (84.4 kg)  Height: 5\' 4"  (1.626 m)    General appearance : A&OX3. NAD. Appears ill but Non-toxic-appearing HEENT: Atraumatic and Normocephalic.  PERRLA. EOM intact.  TM clear B. Mouth-MMM, post pharynx WNL w/o erythema, No  PND. Neck: supple, no JVD. No cervical lymphadenopathy. No thyromegaly Chest/Lungs:  Breathing-non-labored, Good air entry bilaterally, breath sounds normal without rales, rhonchi, or wheezing  CVS: S1 S2 regular, no murmurs, gallops, rubs  Abdomen: Bowel sounds present, abdomen is mildly distended.  + guarding.  + Murphy's sign, +TTP RUQ and midepigastric area.  No ascites/fluid wave. Extremities: Bilateral Lower Ext shows no edema, both legs are warm to touch with = pulse throughout Neurology:  CN II-XII grossly intact, Non focal.   Psych:  TP linear. J/I WNL. Normal speech. Appropriate eye contact and affect.  Skin:  No Rash  Data Review Lab Results  Component Value Date   HGBA1C 5.50 06/12/2015   HGBA1C 5.7 (H) 12/26/2013     Assessment & Plan   1. Nausea and vomiting, intractability of vomiting not specified, unspecified vomiting type To ED.  NPO.    2. RUQ pain To ED-R/O acute abdomen  3. Dizziness To ED  4. Fever, unspecified fever cause To ED   Patient have been counseled extensively about nutrition and exercise  Return if symptoms worsen or fail to improve.  The patient was given clear instructions to go to ER or return to medical center if symptoms don't improve, worsen or new problems develop. The patient verbalized understanding. The patient was told to call to get lab results if they haven't heard anything in the next week.     12/28/2013, PA-C [facility], [place]   06/16/2017, 4:08 PM

## 2017-06-16 NOTE — Patient Instructions (Signed)
Immediately to Emergency Dept for acute abdomen work-up.

## 2017-06-16 NOTE — ED Provider Notes (Signed)
10:39 PM Handoff from [place] at shift change. Patient with 3 days of nausea, vomiting, diarrhea, abdominal pain, fever. Patient had ultrasound performed which showed some gallbladder sludge, however no gallstones. Patient is moderately tender in the periumbilical area and is requiring more pain medication. Lab work reviewed and is reassuring. Hematuria, however patient is on her menstrual period.  Given her tenderness without explanation at this point, feel that CT imaging is indicated at this time to rule out intra-abdominal infection such as appendicitis. Discussed with patient and family, they agree.  11:38 PM CT imaging completed. Patient updated. No acute or emergent process noted. Will discharge to home with Zofran and Bentyl. Encouraged PCP follow-up for recheck in the next 3 days.  11:38 PM The patient was urged to return to the Emergency Department immediately with worsening of current symptoms, worsening abdominal pain, persistent vomiting, blood noted in stools, fever, or any other concerns. The patient verbalized understanding.   BP 129/88   Pulse 87   Temp 98.5 F (36.9 C) (Oral)   Resp 18   LMP 06/16/2017   SpO2 100%     Renne Crigler, PA-C 06/16/17 2338    Arby Barrette, MD 06/17/17 [phone]

## 2017-06-16 NOTE — ED Notes (Signed)
Called [place] to see how long before pt is taken to [place]. States that pt is next.

## 2017-06-16 NOTE — ED Triage Notes (Signed)
Pt to ER with complaint of generalized abdominal discomfort onset Tuesday with nausea, vomiting, and diarrhea. States also back pain. Pt is a/o x4. NAD at triage.

## 2017-06-16 NOTE — ED Notes (Signed)
Pt returned from US

## 2017-06-17 NOTE — ED Notes (Signed)
After inspection on IV site, noticed swelling above the IV cath. Asked pt if she had contrast while in CT and pt states yes. No redness noted, only swelling and pt c/o pain in that location. IV removed intact. Ice pack given to pt and instructed to apply ice to area and keep an eye on it.  If any changes or it gets worse, told to return to ED to have area checked. States understanding.

## 2017-09-02 ENCOUNTER — Ambulatory Visit: Payer: Self-pay | Attending: Nurse Practitioner | Admitting: Nurse Practitioner

## 2017-09-02 ENCOUNTER — Encounter: Payer: Self-pay | Admitting: Nurse Practitioner

## 2017-09-02 VITALS — BP 110/73 | HR 81 | Temp 98.6°F | Ht 64.0 in | Wt 189.4 lb

## 2017-09-02 DIAGNOSIS — I10 Essential (primary) hypertension: Secondary | ICD-10-CM | POA: Insufficient documentation

## 2017-09-02 DIAGNOSIS — M069 Rheumatoid arthritis, unspecified: Secondary | ICD-10-CM | POA: Insufficient documentation

## 2017-09-02 DIAGNOSIS — F419 Anxiety disorder, unspecified: Secondary | ICD-10-CM | POA: Insufficient documentation

## 2017-09-02 DIAGNOSIS — R51 Headache: Secondary | ICD-10-CM | POA: Insufficient documentation

## 2017-09-02 DIAGNOSIS — Z791 Long term (current) use of non-steroidal anti-inflammatories (NSAID): Secondary | ICD-10-CM | POA: Insufficient documentation

## 2017-09-02 DIAGNOSIS — Z8249 Family history of ischemic heart disease and other diseases of the circulatory system: Secondary | ICD-10-CM | POA: Insufficient documentation

## 2017-09-02 DIAGNOSIS — K219 Gastro-esophageal reflux disease without esophagitis: Secondary | ICD-10-CM

## 2017-09-02 DIAGNOSIS — Z833 Family history of diabetes mellitus: Secondary | ICD-10-CM | POA: Insufficient documentation

## 2017-09-02 DIAGNOSIS — Z7952 Long term (current) use of systemic steroids: Secondary | ICD-10-CM | POA: Insufficient documentation

## 2017-09-02 DIAGNOSIS — Z79891 Long term (current) use of opiate analgesic: Secondary | ICD-10-CM | POA: Insufficient documentation

## 2017-09-02 DIAGNOSIS — Z91018 Allergy to other foods: Secondary | ICD-10-CM | POA: Insufficient documentation

## 2017-09-02 DIAGNOSIS — K210 Gastro-esophageal reflux disease with esophagitis: Secondary | ICD-10-CM | POA: Insufficient documentation

## 2017-09-02 DIAGNOSIS — Z91012 Allergy to eggs: Secondary | ICD-10-CM | POA: Insufficient documentation

## 2017-09-02 DIAGNOSIS — R112 Nausea with vomiting, unspecified: Secondary | ICD-10-CM

## 2017-09-02 DIAGNOSIS — Z79899 Other long term (current) drug therapy: Secondary | ICD-10-CM | POA: Insufficient documentation

## 2017-09-02 LAB — POCT URINALYSIS DIPSTICK
BILIRUBIN UA: NEGATIVE
Blood, UA: NEGATIVE
Glucose, UA: NEGATIVE
KETONES UA: NEGATIVE
NITRITE UA: NEGATIVE
PH UA: 6.5 (ref 5.0–8.0)
Protein, UA: NEGATIVE
SPEC GRAV UA: 1.02 (ref 1.010–1.025)
UROBILINOGEN UA: 0.2 U/dL

## 2017-09-02 MED ORDER — BENZOCAINE-MENTHOL 6-10 MG MT LOZG: 1.0000 | LOZENGE | OROMUCOSAL | 0 refills | Status: DC | PRN

## 2017-09-02 MED ORDER — ONDANSETRON 4 MG PO TBDP: 4.0000 mg | ORAL_TABLET | Freq: Three times a day (TID) | ORAL | 0 refills | Status: AC | PRN

## 2017-09-02 MED ORDER — RANITIDINE HCL 150 MG PO TABS: 150.0000 mg | ORAL_TABLET | Freq: Every day | ORAL | 3 refills | Status: DC

## 2017-09-02 MED ORDER — ONDANSETRON 4 MG PO TBDP: 4.0000 mg | ORAL_TABLET | Freq: Three times a day (TID) | ORAL | 0 refills | Status: DC | PRN

## 2017-09-02 MED FILL — raNITIdine HCL 150 MG TABS: 150 | 30 days supply | Qty: 30 | Fill #0

## 2017-09-02 MED FILL — ONDANSETRON ODT 4 MG TABLET: 4 | 7 days supply | Qty: 21 | Fill #0

## 2017-09-02 NOTE — Progress Notes (Signed)
Assessment & Plan:  Karina Murphy was seen today for emesis.  Diagnoses and all orders for this visit:  Gastroesophageal reflux disease, esophagitis presence not specified -     ranitidine (ZANTAC) 150 MG tablet; Take 1 tablet (150 mg total) by mouth at bedtime. -     benzocaine-menthol (CHLORASEPTIC) 6-10 MG lozenge; Take 1 lozenge by mouth as needed for sore throat. -     Ambulatory referral to Gastroenterology Avoid GERD Triggers  Nausea and vomiting, intractability of vomiting not specified, unspecified vomiting type -     CMP14+EGFR -     CBC -     Urinalysis, dipstick only -     ondansetron (ZOFRAN ODT) 4 MG disintegrating tablet; Take 1 tablet (4 mg total) by mouth every 8 (eight) hours as needed for up to 14 days for nausea or vomiting. -     Ambulatory referral to Gastroenterology -     Urinalysis Dipstick   Patient has been counseled on age-appropriate routine health concerns for screening and prevention. These are reviewed and up-to-date. Referrals have been placed accordingly. Immunizations are up-to-date or declined.    Subjective:   Chief Complaint  Patient presents with  . Emesis    Patient stated she feels vomit and felt nausea for 3 weeks. Patient stated she feel like her food are not digesting well. Patient stated she takes OTC medicine for her headaches and does not help her. Patient stated she takes Omeprazole and pepto-bismol for her stomach. Patient stated it did not help her and her stomach hurts to the point she have to vomit.    HPI Karina Murphy 36 y.o. female presents to office today with concerns of continued nausea and vomiting for 3 weeks. She is taking omeprazole as prescribed with little relief of symptoms. She is not taking ranitidine at night as prescribed.  She stopped taking it 2-3 months ago. She had a follow up appointment in June 2018 for follow up with GI and she was a no show. She reports she could not get off from work for the appointment.      GERD Patient complains of acid reflux. This has been associated with sore throat, nausea and vomiting.  She denies chest pain, deep pressure at base of neck and difficulty swallowing. Symptoms have been present for several months. She denies dysphagia.  She has not lost weight. She denies melena, hematochezia, hematemesis, and coffee ground emesis. Medical therapy in the past has included H2 antagonists and proton pump inhibitors.  Review of Systems  Constitutional: Negative for fever, malaise/fatigue and weight loss.  HENT: Negative.  Negative for nosebleeds.   Eyes: Negative.  Negative for blurred vision, double vision and photophobia.  Respiratory: Negative.  Negative for cough and shortness of breath.   Cardiovascular: Negative.  Negative for chest pain, palpitations and leg swelling.  Gastrointestinal: Positive for abdominal pain, heartburn, nausea and vomiting. Negative for constipation and diarrhea.  Musculoskeletal: Negative.  Negative for myalgias.  Neurological: Negative.  Negative for dizziness, focal weakness, seizures and headaches.  Endo/Heme/Allergies: Negative for environmental allergies.  Psychiatric/Behavioral: Negative.  Negative for suicidal ideas.    Past Medical History:  Diagnosis Date  . Anxiety   . Arthritis, rheumatoid ([place])   . Chronic headaches   . Hypertension   . Urticaria     Past Surgical History:  Procedure Laterality Date  . NO PAST SURGERIES      Family History  Problem Relation Age of Onset  . Diabetes Mother   .  Hypertension Mother   . Stomach cancer Neg Hx   . Colon cancer Neg Hx     Social History Reviewed with no changes to be made today.   Outpatient Medications Prior to Visit  Medication Sig Dispense Refill  . omeprazole (PRILOSEC) 20 MG capsule Take 1 capsule (20 mg total) by mouth daily. 30 minutes before breakfast 30 capsule 3  . cetirizine (ZYRTEC) 10 MG tablet Take one tablet by mouth twice daily as directed. (Patient not  taking: Reported on 09/02/2017) 60 tablet 5  . diclofenac (VOLTAREN) 75 MG EC tablet Take 75 mg by mouth 2 (two) times daily.    Marland Kitchen dicyclomine (BENTYL) 20 MG tablet Take 1 tablet (20 mg total) by mouth 2 (two) times daily. (Patient not taking: Reported on 09/02/2017) 20 tablet 0  . escitalopram (LEXAPRO) 20 MG tablet TAKE ONE TABLET BY MOUTH DAILY. (Patient not taking: Reported on 09/02/2017) 90 tablet 0  . ferrous sulfate 325 (65 FE) MG tablet Take 1 tablet (325 mg total) by mouth daily with breakfast. (Patient not taking: Reported on 09/02/2017) 30 tablet 2  . fexofenadine (ALLEGRA ALLERGY) 180 MG tablet Take 1 tablet (180 mg total) by mouth daily. (Patient not taking: Reported on 09/02/2017) 24 tablet 6  . montelukast (SINGULAIR) 10 MG tablet Take 1 tablet (10 mg total) by mouth daily. (Patient not taking: Reported on 09/02/2017) 30 tablet 5  . naproxen (NAPROSYN) 500 MG tablet TAKE ONE TABLET BY MOUTH TWICE A DAY WITH MEALS FOR 10 DAYS. THEN ONE TABLET BY MOUTH ONCE A DAY WITH MEALS DAILY AS NEEDED. (Patient not taking: Reported on 09/02/2017) 30 tablet 0  . ondansetron (ZOFRAN ODT) 4 MG disintegrating tablet Take 1 tablet (4 mg total) by mouth every 8 (eight) hours as needed for nausea or vomiting. (Patient not taking: Reported on 09/02/2017) 10 tablet 0  . predniSONE (DELTASONE) 20 MG tablet Two daily with food (Patient not taking: Reported on 09/02/2017) 10 tablet 0  . promethazine (PHENERGAN) 25 MG tablet Take 1 tablet (25 mg total) by mouth every 8 (eight) hours as needed for nausea or vomiting. (Patient not taking: Reported on 09/02/2017) 20 tablet 0  . ranitidine (ZANTAC) 150 MG tablet Take 1 tablet (150 mg total) by mouth at bedtime. (Patient not taking: Reported on 09/02/2017) 30 tablet 3  . traMADol (ULTRAM) 50 MG tablet Take 1 tablet (50 mg total) by mouth every 8 (eight) hours as needed for severe pain. (Patient not taking: Reported on 09/02/2017) 40 tablet 0  . triamcinolone (KENALOG)  0.025 % cream Apply 1 application topically 2 (two) times daily. (Patient not taking: Reported on 09/02/2017) 30 g 0   Facility-Administered Medications Prior to Visit  Medication Dose Route Frequency Provider Last Rate Last Dose  . gi cocktail (Maalox,Lidocaine,Donnatal)  30 mL Oral Once Boykin Nearing, MD        Allergies  Allergen Reactions  . Eggs Or Egg-Derived Products Nausea And Vomiting    Nausea/vomitting only with whole eggs, no problem with eggs used in ingredients  . Pineapple        Objective:    BP 110/73 (BP Location: Left Arm, Patient Position: Sitting, Cuff Size: Normal)   Pulse 81   Temp 98.6 F (37 C) (Oral)   Ht 5' 4" (1.626 m)   Wt 189 lb 6.4 oz (85.9 kg)   LMP 08/15/2017   SpO2 100%   BMI 32.51 kg/m  Wt Readings from Last 3 Encounters:  09/02/17 189 lb 6.4  oz (85.9 kg)  06/16/17 186 lb (84.4 kg)  03/03/17 182 lb 3.2 oz (82.6 kg)    Physical Exam  Constitutional: She is oriented to person, place, and time. She appears well-developed and well-nourished. She is cooperative.  HENT:  Head: Normocephalic and atraumatic.  Mouth/Throat: No oropharyngeal exudate, posterior oropharyngeal edema, posterior oropharyngeal erythema or tonsillar abscesses.  Eyes: EOM are normal.  Neck: Normal range of motion. No thyromegaly present.  Cardiovascular: Normal rate, regular rhythm, normal heart sounds and intact distal pulses. Exam reveals no gallop and no friction rub.  No murmur heard. Pulmonary/Chest: Effort normal and breath sounds normal. No tachypnea. No respiratory distress. She has no decreased breath sounds. She has no wheezes. She has no rhonchi. She has no rales. She exhibits no tenderness.  Abdominal: Soft. Bowel sounds are normal.  Musculoskeletal: Normal range of motion. She exhibits no edema.  Lymphadenopathy:    She has no cervical adenopathy.  Neurological: She is alert and oriented to person, place, and time. Coordination normal.  Skin: Skin is  warm and dry.  Psychiatric: She has a normal mood and affect. Her behavior is normal. Judgment and thought content normal.  Nursing note and vitals reviewed.     Patient has been counseled extensively about nutrition and exercise as well as the importance of adherence with medications and regular follow-up. The patient was given clear instructions to go to ER or return to medical center if symptoms don't improve, worsen or new problems develop. The patient verbalized understanding.   Follow-up: Return if symptoms worsen or fail to improve.   Gildardo Pounds, FNP-BC [facility] and [place], [place]   09/05/2017, 10:44 PM

## 2017-09-02 NOTE — Patient Instructions (Addendum)
Opciones de alimentos para pacientes con reflujo gastroesofgico - Adultos (Food Choices for Gastroesophageal Reflux Disease, Adult) Cuando se tiene reflujo gastroesofgico (ERGE), los alimentos que se ingieren y los hbitos de alimentacin son muy importantes. Elegir los alimentos adecuados puede ayudar a [employer]. QU PAUTAS DEBO SEGUIR?  Elija las frutas, los vegetales, los cereales integrales y los productos lcteos con bajo contenido de [place].  Elija las carnes de [place], de pescado y de ave con bajo contenido de grasas.  Limite las grasas, [address] [place], los aderezos para  [place], la [place], los frutos secos y Programme researcher, broadcasting/film/video.  Lleve un registro de alimentos. Esto ayuda a identificar los alimentos que ocasionan sntomas.  Evite los alimentos que le ocasionen sntomas. Pueden ser distintos para cada persona.  Haga comidas pequeas durante Glass blower/designer de 3 comidas abundantes.  Coma lentamente, en un lugar donde est distendido.  Limite el consumo de alimentos fritos.  Cocine los alimentos utilizando mtodos que no sean la fritura.  Evite el consumo alcohol.  Evite beber grandes cantidades de lquidos con las comidas.  Evite agacharse o recostarse hasta despus de 2 o 3horas de haber comido.  QU ALIMENTOS NO SE RECOMIENDAN? Estos son algunos alimentos y bebidas que pueden empeorar los sntomas: Veterinary surgeon. Jugo de tomate. Salsa de tomate y espagueti. Ajes. Cebolla y [place]. Rbano picante. Frutas Naranjas, pomelos y limn (fruta y [place]). Carnes Carnes de [place], de pescado y de ave con gran contenido de grasas. Esto incluye los perros calientes, las [place], el [place], la salchicha, el salame y el tocino. Lcteos Leche entera y [place]. [employer]. Crema. [place]. Helados. Queso crema. Bebidas T o caf. Bebidas gaseosas o bebidas energizantes. Condimentos Salsa picante. Salsa barbacoa. Dulces/postres Chocolate y cacao. Rosquillas.  Menta y mentol. Grasas y [employer]. Esto incluye las papas fritas. Otros Vinagre. Especias picantes. Esto incluye la pimienta negra, la pimienta blanca, la pimienta roja, la pimienta de cayena, el curry en polvo, los clavos de [place], el jengibre y el [place] en polvo. Esta no es [employer] de los alimentos y las bebidas que se Theatre stage manager. Comunquese con el nutricionista para recibir ms informacin. Esta informacin no tiene Theme park manager el consejo del mdico. Asegrese de hacerle al mdico cualquier pregunta que tenga. Document Released: 02/29/2012 Document Revised: 09/20/2014 Document Reviewed: 07/04/2013 Elsevier Interactive Patient Education  2017 Elsevier Inc.  Opciones de alimentos para pacientes con reflujo gastroesofgico - Adultos (Food Choices for Gastroesophageal Reflux Disease, Adult) Cuando se tiene reflujo gastroesofgico (ERGE), los alimentos que se ingieren y los hbitos de alimentacin son Engineer, production. Elegir los alimentos adecuados puede ayudar a [employer]. QU PAUTAS DEBO SEGUIR?  Elija las frutas, los vegetales, los cereales integrales y los productos lcteos con bajo contenido de [facility].  Elija las carnes de [place], de pescado y de ave con bajo contenido de grasas.  Limite las grasas, [address] [place], los aderezos para [place], la [place], los frutos secos y Programme researcher, broadcasting/film/video.  Lleve un registro de alimentos. Esto ayuda a identificar los alimentos que ocasionan sntomas.  Evite los alimentos que le ocasionen sntomas. Pueden ser distintos para cada persona.  Haga comidas pequeas durante Glass blower/designer de 3 comidas abundantes.  Coma lentamente, en un lugar donde est distendido.  Limite el consumo de alimentos fritos.  Cocine los alimentos utilizando mtodos que no sean la fritura.  Evite el consumo alcohol.  Evite beber grandes cantidades de lquidos con  las comidas.  Evite agacharse o recostarse hasta despus de  2 o 3horas de haber comido.  QU ALIMENTOS NO SE RECOMIENDAN? Estos son algunos alimentos y bebidas que pueden empeorar los sntomas: Veterinary surgeon. Jugo de tomate. Salsa de tomate y espagueti. Ajes. [place] y [place]. Rbano picante. Frutas Naranjas, pomelos y limn (fruta y [place]). Carnes Carnes de [place], de pescado y de ave con gran contenido de grasas. Esto incluye los perros calientes, las [place], el [place], la salchicha, el salame y el tocino. Lcteos Leche entera y [place]. [employer]. Crema. [place]. Helados. Queso crema. Bebidas T o caf. Bebidas gaseosas o bebidas energizantes. Condimentos Salsa picante. Salsa barbacoa. Dulces/postres Chocolate y cacao. Rosquillas. Menta y mentol. Grasas y [employer]. Esto incluye las papas fritas. Otros Vinagre. Especias picantes. Esto incluye la pimienta negra, la pimienta blanca, la pimienta roja, la pimienta de cayena, el curry en polvo, los clavos de [place], el jengibre y el [place] en polvo. Esta no es [employer] de los alimentos y las bebidas que se Theatre stage manager. Comunquese con el nutricionista para recibir ms informacin. Esta informacin no tiene Theme park manager el consejo del mdico. Asegrese de hacerle al mdico cualquier pregunta que tenga. Document Released: 02/29/2012 Document Revised: 09/20/2014 Document Reviewed: 07/04/2013 Elsevier Interactive Patient Education  2017 [employer].

## 2017-09-03 LAB — CMP14+EGFR
A/G RATIO: 1.3 (ref 1.2–2.2)
ALK PHOS: 69 IU/L (ref 39–117)
ALT: 7 IU/L (ref 0–32)
AST: 9 IU/L (ref 0–40)
Albumin: 4.5 g/dL (ref 3.5–5.5)
BUN/Creatinine Ratio: 18 (ref 9–23)
BUN: 13 mg/dL (ref 6–20)
Bilirubin Total: 0.7 mg/dL (ref 0.0–1.2)
CALCIUM: 10.1 mg/dL (ref 8.7–10.2)
CO2: 19 mmol/L — AB (ref 20–29)
CREATININE: 0.73 mg/dL (ref 0.57–1.00)
Chloride: 103 mmol/L (ref 96–106)
GFR calc Af Amer: 124 mL/min/{1.73_m2} (ref >59)
GFR, EST NON AFRICAN AMERICAN: 108 mL/min/{1.73_m2} (ref >59)
Globulin, Total: 3.5 g/dL (ref 1.5–4.5)
Glucose: 97 mg/dL (ref 65–99)
POTASSIUM: 4.4 mmol/L (ref 3.5–5.2)
Sodium: 140 mmol/L (ref 134–144)
Total Protein: 8 g/dL (ref 6.0–8.5)

## 2017-09-03 LAB — CBC
HEMOGLOBIN: 12.3 g/dL (ref 11.1–15.9)
Hematocrit: 38 % (ref 34.0–46.6)
MCH: 28.3 pg (ref 26.6–33.0)
MCHC: 32.4 g/dL (ref 31.5–35.7)
MCV: 88 fL (ref 79–97)
Platelets: 361 10*3/uL (ref 150–379)
RBC: 4.34 x10E6/uL (ref 3.77–5.28)
RDW: 14.3 % (ref 12.3–15.4)
WBC: 7.3 10*3/uL (ref 3.4–10.8)

## 2017-09-05 ENCOUNTER — Encounter: Payer: Self-pay | Admitting: Nurse Practitioner

## 2017-09-14 ENCOUNTER — Telehealth: Payer: Self-pay

## 2017-09-14 NOTE — Telephone Encounter (Signed)
-----   Message from Claiborne Rigg, NP sent at 09/13/2017 11:10 PM EST ----- Labs do not show UTI.

## 2017-09-14 NOTE — Telephone Encounter (Signed)
Patient returned called and CMA informed patient on lab result.   Patient verified DOB.

## 2017-09-14 NOTE — Telephone Encounter (Signed)
-----   Message from Zelda W Fleming, NP sent at 09/13/2017 11:10 PM EST ----- Labs do not show UTI. 

## 2017-10-07 ENCOUNTER — Ambulatory Visit: Payer: Self-pay

## 2017-10-20 ENCOUNTER — Ambulatory Visit (INDEPENDENT_AMBULATORY_CARE_PROVIDER_SITE_OTHER): Payer: Self-pay | Admitting: Gastroenterology

## 2017-10-20 ENCOUNTER — Encounter (INDEPENDENT_AMBULATORY_CARE_PROVIDER_SITE_OTHER): Payer: Self-pay

## 2017-10-20 ENCOUNTER — Encounter: Payer: Self-pay | Admitting: Gastroenterology

## 2017-10-20 VITALS — Ht 64.0 in | Wt 178.2 lb

## 2017-10-20 DIAGNOSIS — R1110 Vomiting, unspecified: Secondary | ICD-10-CM

## 2017-10-20 DIAGNOSIS — R112 Nausea with vomiting, unspecified: Secondary | ICD-10-CM

## 2017-10-20 DIAGNOSIS — K588 Other irritable bowel syndrome: Secondary | ICD-10-CM

## 2017-10-20 NOTE — Patient Instructions (Addendum)
Take Align 1 capsule daily  Low FODMAP diet handout given  We will refer you to see Dr Claire Shown at [facility] will follow up with Dr Lavon Paganini after you see Dr Claire Shown at [facility]  If you are age 36 or older, your body mass index should be between 23-30. Your Body mass index is 30.59 kg/m. If this is out of the aforementioned range listed, please consider follow up with your Primary Care Provider.  If you are age 3 or younger, your body mass index should be between 19-25. Your Body mass index is 30.59 kg/m. If this is out of the aformentioned range listed, please consider follow up with your Primary Care Provider.

## 2017-10-20 NOTE — Progress Notes (Signed)
Karina Murphy    [id number]    January 06, 1981  Primary Care Physician:Hairston, Oren Beckmann, FNP  Referring Physician: Claiborne Rigg, NP [address] [place], [place] [address]  Chief complaint: Vomiting, epigastric abdominal pain  HPI: 37 year old female Spanish speaking here for follow-up visit.  Spanish language interpreter was used during this visit.  She was last seen in office in May 2018.  She complains of persistent epigastric abdominal pain worse after a meal.  She has vomiting with every meal, even when she drinks coffee or water.  She has not lost any weight recently but overall has lost about 15-20 pounds predominantly between 2015 and 2016.  Her weight has been somewhat stable the past 2 years.  Denies any constipation, melena or blood per rectum.  She has intermittent nausea and also diarrhea.  No nocturnal symptoms. She has had extensive GI workup including unremarkable EGD, unremarkable right upper quadrant ultrasound with findings of fatty liver and gallbladder sludge, normal gallbladder function on HIDA scan with no cystic duct or CBD obstruction, normal abdomen pelvis with contrast and gastric emptying scan which showed rapid gastric emptying with 98% emptied 2-hour  Outpatient Encounter Medications as of 10/20/2017  Medication Sig  . cetirizine (ZYRTEC) 10 MG tablet Take one tablet by mouth twice daily as directed.  . diclofenac (VOLTAREN) 75 MG EC tablet Take 75 mg by mouth 2 (two) times daily.  Marland Kitchen omeprazole (PRILOSEC) 20 MG capsule Take 1 capsule (20 mg total) by mouth daily. 30 minutes before breakfast  . ranitidine (ZANTAC) 150 MG tablet Take 1 tablet (150 mg total) by mouth at bedtime.  . [DISCONTINUED] benzocaine-menthol (CHLORASEPTIC) 6-10 MG lozenge Take 1 lozenge by mouth as needed for sore throat. (Patient not taking: Reported on 10/20/2017)  . [DISCONTINUED] SUMAtriptan (IMITREX) 100 MG tablet Take 1 tab at earliest onset of headache.  May  repeat once in 2 hours if headache persists or recurs. (Patient not taking: Reported on 10/06/2015)   Facility-Administered Encounter Medications as of 10/20/2017  Medication  . gi cocktail (Maalox,Lidocaine,Donnatal)    Allergies as of 10/20/2017 - Review Complete 10/20/2017  Allergen Reaction Noted  . Eggs or egg-derived products Nausea And Vomiting 10/06/2015  . Pineapple  12/06/2016    Past Medical History:  Diagnosis Date  . Anxiety   . Arthritis, rheumatoid (HCC)   . Chronic headaches   . Hypertension   . Urticaria     Past Surgical History:  Procedure Laterality Date  . NO PAST SURGERIES      Family History  Problem Relation Age of Onset  . Diabetes Mother   . Hypertension Mother   . Stomach cancer Neg Hx   . Colon cancer Neg Hx     Social History   Socioeconomic History  . Marital status: Single    Spouse name: Not on file  . Number of children: 2  . Years of education: Not on file  . Highest education level: Not on file  Social Needs  . Financial resource strain: Not on file  . Food insecurity - worry: Not on file  . Food insecurity - inability: Not on file  . Transportation needs - medical: Not on file  . Transportation needs - non-medical: Not on file  Occupational History  . Not on file  Tobacco Use  . Smoking status: Never Smoker  . Smokeless tobacco: Never Used  Substance and Sexual Activity  . Alcohol use: No  .  Drug use: No  . Sexual activity: Yes    Birth control/protection: Condom  Other Topics Concern  . Not on file  Social History Narrative   Completed 2nd grade.       Review of systems: Review of Systems  Constitutional: Negative for fever and chills.  HENT: Negative.   Eyes: Negative for blurred vision.  Respiratory: Negative for cough, shortness of breath and wheezing.   Cardiovascular: Negative for chest pain and palpitations.  Gastrointestinal: as per HPI Genitourinary: Negative for dysuria, urgency, frequency and  hematuria.  Musculoskeletal: Negative for myalgias, back pain and joint pain.  Skin: Negative for itching and rash.  Neurological: Negative for dizziness, tremors, focal weakness, seizures and loss of consciousness.  Endo/Heme/Allergies: Positive for seasonal allergies.  Psychiatric/Behavioral: Negative for depression, suicidal ideas and hallucinations.  All other systems reviewed and are negative.   Physical Exam: Body mass index is 30.59 kg/m. Gen:      No acute distress HEENT:  EOMI, sclera anicteric Neck:     No masses; no thyromegaly Lungs:    Clear to auscultation bilaterally; normal respiratory effort CV:         Regular rate and rhythm; no murmurs Abd:      + bowel sounds; soft, non-tender; no palpable masses, no distension Ext:    No edema; adequate peripheral perfusion Skin:      Warm and dry; no rash Neuro: alert and oriented x 3 Psych: normal mood and affect  Data Reviewed:  Reviewed labs, radiology imaging, old records and pertinent past GI work up   Assessment and Plan/Recommendations:  37 year old female with complaints of persistent epigastric abdominal pain worse postprandial and vomiting after every meal. 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 Alycia Rossetti at [facility] for further evaluation and management Will do a trial of low FODMAP diet and probiotic to see if it helps with her irritable bowel syndrome with diarrhea  25 minutes was spent face-to-face with the patient. Greater than 50% of the time used for counseling as well as treatment plan and follow-up. She had multiple questions which were answered to her satisfaction  K. Scherry Ran , MD [phone] Mon-Fri 8a-5p [phone] after 5p, weekends, holidays  CC: Claiborne Rigg, NP

## 2017-10-25 ENCOUNTER — Telehealth: Payer: Self-pay | Admitting: *Deleted

## 2017-10-25 NOTE — Telephone Encounter (Signed)
[facility] and spoke with receptionist for Karina Murphy in [place] and we have referred this patient before in 2017 for nausea and vomiting and abdominal pain and she had to have financial counseling before they will schedule her an appointment...  .. Dr Karina Murphy will not see self pay patients      Lou Miner ,MD  [facility]  June 24th 2:15pm is her appointment, they will mail patient a packet of information with her financial appointment as well

## 2017-10-25 NOTE — Telephone Encounter (Signed)
Need to send referral to Dr Claire Shown for this patient for chronic vomiting waiting on Dr Sharol Given note to be signed to complete the referral

## 2017-11-15 ENCOUNTER — Other Ambulatory Visit: Payer: Self-pay

## 2017-11-15 ENCOUNTER — Ambulatory Visit (HOSPITAL_COMMUNITY)
Admission: EM | Admit: 2017-11-15 | Discharge: 2017-11-15 | Disposition: A | Discharge status: Home / Self Care | Payer: Self-pay | Attending: Family Medicine | Admitting: Family Medicine

## 2017-11-15 ENCOUNTER — Encounter (HOSPITAL_COMMUNITY): Payer: Self-pay | Admitting: Family Medicine

## 2017-11-15 DIAGNOSIS — R112 Nausea with vomiting, unspecified: Secondary | ICD-10-CM

## 2017-11-15 MED ORDER — PROMETHAZINE HCL 12.5 MG PO TABS: 12.5000 mg | ORAL_TABLET | Freq: Every evening | ORAL | 3 refills | Status: DC | PRN

## 2017-11-15 NOTE — ED Provider Notes (Signed)
[place]   [id number] 11/15/17 Arrival Time: 1847   SUBJECTIVE:  Karina Murphy is a 37 y.o. female who presents to the urgent care with complaint of vomiting and chest pain.  She vomited today 4 times and was lightheaded after the most recent bout.  She has lots of gas, and any amount of greasy food or coffee results in immediate vomiting.  Some epigastric pain.  No diarrhea or fever.   Note from one month ago at [facility]: 37 year old female Spanish speaking here for follow-up visit.  Spanish language interpreter was used during this visit.  She was last seen in office in May 2018.  She complains of persistent epigastric abdominal pain worse after a meal.  She has vomiting with every meal, even when she drinks coffee or water.  She has not lost any weight recently but overall has lost about 15-20 pounds predominantly between 2015 and 2016.  Her weight has been somewhat stable the past 2 years.  Denies any constipation, melena or blood per rectum.  She has intermittent nausea and also diarrhea.  No nocturnal symptoms. She has had extensive GI workup including unremarkable EGD, unremarkable right upper quadrant ultrasound with findings of fatty liver and gallbladder sludge, normal gallbladder function on HIDA scan with no cystic duct or CBD obstruction, normal abdomen pelvis with contrast and gastric emptying scan which showed rapid gastric emptying with 98% emptied 2-hour     Past Medical History:  Diagnosis Date  . Anxiety   . Arthritis, rheumatoid ([place])   . Chronic headaches   . Hypertension   . Urticaria    Family History  Problem Relation Age of Onset  . Diabetes Mother   . Hypertension Mother   . Stomach cancer Neg Hx   . Colon cancer Neg Hx    Social History   Socioeconomic History  . Marital status: Single    Spouse name: Not on file  . Number of children: 2  . Years of education: Not on file  . Highest education level: Not on file  Social Needs   . Financial resource strain: Not on file  . Food insecurity - worry: Not on file  . Food insecurity - inability: Not on file  . Transportation needs - medical: Not on file  . Transportation needs - non-medical: Not on file  Occupational History  . Not on file  Tobacco Use  . Smoking status: Never Smoker  . Smokeless tobacco: Never Used  Substance and Sexual Activity  . Alcohol use: No  . Drug use: No  . Sexual activity: Not Currently    Birth control/protection: Condom  Other Topics Concern  . Not on file  Social History Narrative   Completed 2nd grade.    Current Meds  Medication Sig  . naproxen sodium (ALEVE) 220 MG tablet Take 220 mg by mouth.   Allergies  Allergen Reactions  . Eggs Or Egg-Derived Products Nausea And Vomiting    Nausea/vomitting only with whole eggs, no problem with eggs used in ingredients  . Pineapple       ROS: As per HPI, remainder of ROS negative.   OBJECTIVE:   Vitals:   11/15/17 1902  BP: 133/82  Pulse: 75  Temp: 99 F (37.2 C)  TempSrc: Oral  SpO2: 100%     General appearance: alert; no distress Eyes: PERRL; EOMI; conjunctiva normal HENT: normocephalic; atraumatic; TMs normal, canal normal, external ears normal without trauma; nasal mucosa normal; oral mucosa normal Neck: supple Lungs:  clear to auscultation bilaterally Heart: regular rate and rhythm Abdomen: soft, non-tender; bowel sounds normal; no masses or organomegaly; no guarding or rebound tenderness Back: no CVA tenderness Extremities: no cyanosis or edema; symmetrical with no gross deformities Skin: warm and dry Neurologic: normal gait; grossly normal Psychological: alert and cooperative; normal mood and affect      Labs:  Results for orders placed or performed in visit on 09/02/17  CMP14+EGFR  Result Value Ref Range   Glucose 97 65 - 99 mg/dL   BUN 13 6 - 20 mg/dL   Creatinine, Ser 0.73 0.57 - 1.00 mg/dL   GFR calc non Af Amer 108 >59 mL/min/1.73   GFR  calc Af Amer 124 >59 mL/min/1.73   BUN/Creatinine Ratio 18 9 - 23   Sodium 140 134 - 144 mmol/L   Potassium 4.4 3.5 - 5.2 mmol/L   Chloride 103 96 - 106 mmol/L   CO2 19 (L) 20 - 29 mmol/L   Calcium 10.1 8.7 - 10.2 mg/dL   Total Protein 8.0 6.0 - 8.5 g/dL   Albumin 4.5 3.5 - 5.5 g/dL   Globulin, Total 3.5 1.5 - 4.5 g/dL   Albumin/Globulin Ratio 1.3 1.2 - 2.2   Bilirubin Total 0.7 0.0 - 1.2 mg/dL   Alkaline Phosphatase 69 39 - 117 IU/L   AST 9 0 - 40 IU/L   ALT 7 0 - 32 IU/L  CBC  Result Value Ref Range   WBC 7.3 3.4 - 10.8 x10E3/uL   RBC 4.34 3.77 - 5.28 x10E6/uL   Hemoglobin 12.3 11.1 - 15.9 g/dL   Hematocrit 38.0 34.0 - 46.6 %   MCV 88 79 - 97 fL   MCH 28.3 26.6 - 33.0 pg   MCHC 32.4 31.5 - 35.7 g/dL   RDW 14.3 12.3 - 15.4 %   Platelets 361 150 - 379 x10E3/uL  Urinalysis Dipstick  Result Value Ref Range   Color, UA Yellow    Clarity, UA clear    Glucose, UA Negative    Bilirubin, UA Negative    Ketones, UA Negative    Spec Grav, UA 1.020 1.010 - 1.025   Blood, UA Negative    pH, UA 6.5 5.0 - 8.0   Protein, UA Negative    Urobilinogen, UA 0.2 0.2 or 1.0 E.U./dL   Nitrite, UA Negative    Leukocytes, UA Moderate (2+) (A) Negative   Appearance clear    Odor      Labs Reviewed - No data to display  No results found.     ASSESSMENT & PLAN:  1. Intractable vomiting with nausea, unspecified vomiting type     Meds ordered this encounter  Medications  . promethazine (PHENERGAN) 12.5 MG tablet    Sig: Take 1 tablet (12.5 mg total) by mouth at bedtime as needed for nausea or vomiting.    Dispense:  30 tablet    Refill:  3    Reviewed expectations re: course of current medical issues. Questions answered. Outlined signs and symptoms indicating need for more acute intervention. Patient verbalized understanding. After Visit Summary given.     Robyn Haber, MD 11/15/17 [name]

## 2017-11-15 NOTE — ED Triage Notes (Signed)
States has been having vomiting x one month, dizziness x 2 weeks and chills onset today

## 2017-11-25 ENCOUNTER — Ambulatory Visit (HOSPITAL_COMMUNITY)
Admission: EM | Admit: 2017-11-25 | Discharge: 2017-11-25 | Disposition: A | Discharge status: Home / Self Care | Payer: Self-pay | Attending: Family Medicine | Admitting: Family Medicine

## 2017-11-25 ENCOUNTER — Encounter (HOSPITAL_COMMUNITY): Payer: Self-pay | Admitting: Emergency Medicine

## 2017-11-25 DIAGNOSIS — L600 Ingrowing nail: Secondary | ICD-10-CM

## 2017-11-25 NOTE — ED Triage Notes (Signed)
Ingrown toenail to right great toe since Monday.

## 2017-11-25 NOTE — ED Provider Notes (Signed)
[facility]   [id number] 11/25/17 Arrival Time: 1520   SUBJECTIVE:  Karina Murphy is a 37 y.o. female who presents to the urgent care with complaint of Ingrown toenail to right great toe since Monday.   Past Medical History:  Diagnosis Date  . Anxiety   . Arthritis, rheumatoid (HCC)   . Chronic headaches   . Hypertension   . Urticaria    Family History  Problem Relation Age of Onset  . Diabetes Mother   . Hypertension Mother   . Stomach cancer Neg Hx   . Colon cancer Neg Hx    Social History   Socioeconomic History  . Marital status: Single    Spouse name: Not on file  . Number of children: 2  . Years of education: Not on file  . Highest education level: Not on file  Social Needs  . Financial resource strain: Not on file  . Food insecurity - worry: Not on file  . Food insecurity - inability: Not on file  . Transportation needs - medical: Not on file  . Transportation needs - non-medical: Not on file  Occupational History  . Not on file  Tobacco Use  . Smoking status: Never Smoker  . Smokeless tobacco: Never Used  Substance and Sexual Activity  . Alcohol use: No  . Drug use: No  . Sexual activity: Not Currently    Birth control/protection: Condom  Other Topics Concern  . Not on file  Social History Narrative   Completed 2nd grade.    Current Meds  Medication Sig  . promethazine (PHENERGAN) 12.5 MG tablet Take 1 tablet (12.5 mg total) by mouth at bedtime as needed for nausea or vomiting.   Allergies  Allergen Reactions  . Eggs Or Egg-Derived Products Nausea And Vomiting    Nausea/vomitting only with whole eggs, no problem with eggs used in ingredients  . Pineapple       ROS: As per HPI, remainder of ROS negative.   OBJECTIVE:   Vitals:   11/25/17 1556 11/25/17 1557  BP:  110/73  Pulse:  78  Resp:  16  Temp:  98.7 F (37.1 C)  TempSrc:  Oral  SpO2:  100%  Weight: 178 lb (80.7 kg)      General appearance: alert; no  distress Eyes: PERRL; EOMI; conjunctiva normal HENT: normocephalic; atraumatic; TMs normal, canal normal, external ears normal without trauma; nasal mucosa normal; oral mucosa normal Neck: supple Skin: warm and dry Neurologic: normal gait; grossly normal Psychological: alert and cooperative; normal mood and affect      Labs:  Results for orders placed or performed in visit on 09/02/17  CMP14+EGFR  Result Value Ref Range   Glucose 97 65 - 99 mg/dL   BUN 13 6 - 20 mg/dL   Creatinine, Ser 0.73 0.57 - 1.00 mg/dL   GFR calc non Af Amer 108 >59 mL/min/1.73   GFR calc Af Amer 124 >59 mL/min/1.73   BUN/Creatinine Ratio 18 9 - 23   Sodium 140 134 - 144 mmol/L   Potassium 4.4 3.5 - 5.2 mmol/L   Chloride 103 96 - 106 mmol/L   CO2 19 (L) 20 - 29 mmol/L   Calcium 10.1 8.7 - 10.2 mg/dL   Total Protein 8.0 6.0 - 8.5 g/dL   Albumin 4.5 3.5 - 5.5 g/dL   Globulin, Total 3.5 1.5 - 4.5 g/dL   Albumin/Globulin Ratio 1.3 1.2 - 2.2   Bilirubin Total 0.7 0.0 - 1.2 mg/dL   Alkaline   Phosphatase 69 39 - 117 IU/L   AST 9 0 - 40 IU/L   ALT 7 0 - 32 IU/L  CBC  Result Value Ref Range   WBC 7.3 3.4 - 10.8 x10E3/uL   RBC 4.34 3.77 - 5.28 x10E6/uL   Hemoglobin 12.3 11.1 - 15.9 g/dL   Hematocrit 38.0 34.0 - 46.6 %   MCV 88 79 - 97 fL   MCH 28.3 26.6 - 33.0 pg   MCHC 32.4 31.5 - 35.7 g/dL   RDW 14.3 12.3 - 15.4 %   Platelets 361 150 - 379 x10E3/uL  Urinalysis Dipstick  Result Value Ref Range   Color, UA Yellow    Clarity, UA clear    Glucose, UA Negative    Bilirubin, UA Negative    Ketones, UA Negative    Spec Grav, UA 1.020 1.010 - 1.025   Blood, UA Negative    pH, UA 6.5 5.0 - 8.0   Protein, UA Negative    Urobilinogen, UA 0.2 0.2 or 1.0 E.U./dL   Nitrite, UA Negative    Leukocytes, UA Moderate (2+) (A) Negative   Appearance clear    Odor      Labs Reviewed - No data to display  No results found.     ASSESSMENT & PLAN:  1. Ingrown toenail of right foot     Reviewed  expectations re: course of current medical issues. Questions answered. Outlined signs and symptoms indicating need for more acute intervention. Patient verbalized understanding. After Visit Summary given.    Procedures:  2% plain Xylocaine was used to anesthetize the distal right great toe and then the nail was elevated on the tibial side and removed.      , , MD 11/25/17 1655  

## 2017-11-25 NOTE — Discharge Instructions (Signed)
Soak the toe several times a day for several days.

## 2017-12-05 ENCOUNTER — Encounter (HOSPITAL_COMMUNITY): Payer: Self-pay | Admitting: Emergency Medicine

## 2017-12-05 ENCOUNTER — Other Ambulatory Visit: Payer: Self-pay

## 2017-12-05 ENCOUNTER — Ambulatory Visit (HOSPITAL_COMMUNITY)
Admission: EM | Admit: 2017-12-05 | Discharge: 2017-12-05 | Disposition: A | Discharge status: Home / Self Care | Payer: Self-pay | Attending: Physician Assistant | Admitting: Physician Assistant

## 2017-12-05 DIAGNOSIS — N644 Mastodynia: Secondary | ICD-10-CM | POA: Insufficient documentation

## 2017-12-05 DIAGNOSIS — I10 Essential (primary) hypertension: Secondary | ICD-10-CM | POA: Insufficient documentation

## 2017-12-05 DIAGNOSIS — E669 Obesity, unspecified: Secondary | ICD-10-CM | POA: Insufficient documentation

## 2017-12-05 DIAGNOSIS — R112 Nausea with vomiting, unspecified: Secondary | ICD-10-CM | POA: Insufficient documentation

## 2017-12-05 DIAGNOSIS — F419 Anxiety disorder, unspecified: Secondary | ICD-10-CM | POA: Insufficient documentation

## 2017-12-05 DIAGNOSIS — R1011 Right upper quadrant pain: Secondary | ICD-10-CM | POA: Insufficient documentation

## 2017-12-05 DIAGNOSIS — M069 Rheumatoid arthritis, unspecified: Secondary | ICD-10-CM | POA: Insufficient documentation

## 2017-12-05 DIAGNOSIS — R197 Diarrhea, unspecified: Secondary | ICD-10-CM | POA: Insufficient documentation

## 2017-12-05 DIAGNOSIS — L509 Urticaria, unspecified: Secondary | ICD-10-CM | POA: Insufficient documentation

## 2017-12-05 LAB — CBC WITH DIFFERENTIAL/PLATELET
Basophils Absolute: 0 10*3/uL (ref 0.0–0.1)
Basophils Relative: 0 %
EOS ABS: 0.1 10*3/uL (ref 0.0–0.7)
EOS PCT: 1 %
HCT: 37.6 % (ref 36.0–46.0)
HEMOGLOBIN: 12.3 g/dL (ref 12.0–15.0)
LYMPHS ABS: 2.1 10*3/uL (ref 0.7–4.0)
LYMPHS PCT: 23 %
MCH: 28.4 pg (ref 26.0–34.0)
MCHC: 32.7 g/dL (ref 30.0–36.0)
MCV: 86.8 fL (ref 78.0–100.0)
MONOS PCT: 4 %
Monocytes Absolute: 0.4 10*3/uL (ref 0.1–1.0)
NEUTROS PCT: 72 %
Neutro Abs: 6.6 10*3/uL (ref 1.7–7.7)
Platelets: 375 10*3/uL (ref 150–400)
RBC: 4.33 MIL/uL (ref 3.87–5.11)
RDW: 14.2 % (ref 11.5–15.5)
WBC: 9.2 10*3/uL (ref 4.0–10.5)

## 2017-12-05 LAB — COMPREHENSIVE METABOLIC PANEL
ALK PHOS: 68 U/L (ref 38–126)
ALT: 11 U/L — AB (ref 14–54)
AST: 17 U/L (ref 15–41)
Albumin: 3.7 g/dL (ref 3.5–5.0)
Anion gap: 13 (ref 5–15)
BUN: 11 mg/dL (ref 6–20)
CALCIUM: 9.3 mg/dL (ref 8.9–10.3)
CHLORIDE: 103 mmol/L (ref 101–111)
CO2: 23 mmol/L (ref 22–32)
CREATININE: 0.77 mg/dL (ref 0.44–1.00)
GFR calc non Af Amer: >60 mL/min (ref >60)
Glucose, Bld: 93 mg/dL (ref 65–99)
Potassium: 4 mmol/L (ref 3.5–5.1)
SODIUM: 139 mmol/L (ref 135–145)
Total Bilirubin: 0.9 mg/dL (ref 0.3–1.2)
Total Protein: 7.5 g/dL (ref 6.5–8.1)

## 2017-12-05 LAB — LIPASE, BLOOD: Lipase: 25 U/L (ref 11–51)

## 2017-12-05 MED ORDER — ONDANSETRON 4 MG PO TBDP: 4.0000 mg | ORAL_TABLET | Freq: Three times a day (TID) | ORAL | 0 refills | Status: DC | PRN

## 2017-12-05 MED ORDER — DICYCLOMINE HCL 20 MG PO TABS: 20.0000 mg | ORAL_TABLET | Freq: Two times a day (BID) | ORAL | 0 refills | Status: DC

## 2017-12-05 NOTE — ED Triage Notes (Signed)
C/o abdominal pain with vomiting and diarrhea onset today

## 2017-12-05 NOTE — Discharge Instructions (Signed)
Zofran for nausea and vomiting as needed. Bentyl for abdominal cramping. Keep hydrated, you urine should be clear to pale yellow in color. Clear liquid diet for now. As discussed, cannot rule out liver, gallbladder, pancrease cause of pain. Labs drawn today. Monitor for any worsening of symptoms, nausea or vomiting not controlled by medication, worsening abdominal pain, fever, go to the emergency department for further evaluation.

## 2017-12-05 NOTE — ED Provider Notes (Signed)
[facility]    CSN: [id number] Arrival date & time: 12/05/17  1858     History   Chief Complaint Chief Complaint  Patient presents with  . Abdominal Pain    HPI Karina Murphy is a 37 y.o. female.   37 year old female comes in for 1 day history of nausea, vomiting, diarrhea.  Also complaining of right upper quadrant pain.  She has had 6 episodes of nonbilious nonbloody vomit.  5 episodes of diarrhea that is loose and watery, denies blood in stool.  Intermittent right upper quadrant pain that radiates to the back.  She describes it as pressure/tightness, states leaning forward and hugging the stomach makes it feel better, worse with movement.  Subjective fever, last took Tylenol 6 hours ago.  Denies URI symptoms such as rhinorrhea, nasal congestion, sore throat.  Denies history of right upper quadrant pain.     Past Medical History:  Diagnosis Date  . Anxiety   . Arthritis, rheumatoid (HCC)   . Chronic headaches   . Hypertension   . Urticaria     Patient Active Problem List   Diagnosis Date Noted  . Weakness 11/23/2016  . Breast pain, left 09/17/2016  . Abdominal pain, epigastric 07/27/2016  . Rheumatoid arthritis (HCC) 02/12/2014  . Obese 12/26/2013    Past Surgical History:  Procedure Laterality Date  . NO PAST SURGERIES      OB History   None      Home Medications    Prior to Admission medications   Medication Sig Start Date End Date Taking? Authorizing Provider  dicyclomine (BENTYL) 20 MG tablet Take 1 tablet (20 mg total) by mouth 2 (two) times daily. 12/05/17   Cathie Hoops, Amy V, PA-C  naproxen sodium (ALEVE) 220 MG tablet Take 220 mg by mouth.    [provider]  ondansetron (ZOFRAN ODT) 4 MG disintegrating tablet Take 1 tablet (4 mg total) by mouth every 8 (eight) hours as needed for nausea or vomiting. 12/05/17   Cathie Hoops, Amy V, PA-C  promethazine (PHENERGAN) 12.5 MG tablet Take 1 tablet (12.5 mg total) by mouth at bedtime as needed for  nausea or vomiting. 11/15/17   Elvina Sidle, MD    Family History Family History  Problem Relation Age of Onset  . Diabetes Mother   . Hypertension Mother   . Stomach cancer Neg Hx   . Colon cancer Neg Hx     Social History Social History   Tobacco Use  . Smoking status: Never Smoker  . Smokeless tobacco: Never Used  Substance Use Topics  . Alcohol use: No  . Drug use: No     Allergies   Eggs or egg-derived products and Pineapple   Review of Systems Review of Systems  Reason unable to perform ROS: See HPI as above.     Physical Exam Triage Vital Signs ED Triage Vitals  Enc Vitals Group     BP 12/05/17 1934 108/66     Pulse Rate 12/05/17 1934 (!) 109     Resp --      Temp 12/05/17 1934 99.8 F (37.7 C)     Temp Source 12/05/17 1934 Oral     SpO2 12/05/17 1934 100 %     Weight --      Height --      Head Circumference --      Peak Flow --      Pain Score 12/05/17 1932 10     Pain Loc --  Pain Edu? --      Excl. in GC? --    No data found.  Updated Vital Signs BP 108/66 (BP Location: Left Arm)   Pulse (!) 109   Temp 99.8 F (37.7 C) (Oral)   LMP 11/23/2017   SpO2 100%   Physical Exam  Constitutional: She is oriented to person, place, and time. She appears well-developed and well-nourished. No distress.  HENT:  Head: Normocephalic and atraumatic.  Eyes: Pupils are equal, round, and reactive to light. Conjunctivae are normal.  Cardiovascular: Normal rate, regular rhythm and normal heart sounds. Exam reveals no gallop and no friction rub.  No murmur heard. Pulmonary/Chest: Effort normal and breath sounds normal. She has no wheezes. She has no rales.  Abdominal: Soft. Bowel sounds are normal.  Tenderness to palpation of RUQ without guarding or rebound. No obvious Murphy's sign. No obvious hepatomegaly.   Neurological: She is alert and oriented to person, place, and time.  Skin: Skin is warm and dry.  Psychiatric: She has a normal mood and  affect. Her behavior is normal. Judgment normal.     UC Treatments / Results  Labs (all labs ordered are listed, but only abnormal results are displayed) Labs Reviewed  COMPREHENSIVE METABOLIC PANEL  LIPASE, BLOOD  CBC WITH DIFFERENTIAL/PLATELET    EKG None Radiology No results found.  Procedures Procedures (including critical care time)  Medications Ordered in UC Medications - No data to display   Initial Impression / Assessment and Plan / UC Course  I have reviewed the triage vital signs and the nursing notes.  Pertinent labs & imaging results that were available during my care of the patient were reviewed by me and considered in my medical decision making (see chart for details).    Discussed with patient cannot rule out biliary/hepatic, pancreatic cause of symptoms. However, given having diarrhea as well, lower suspicion. Zofran for nausea/vomiting. Bentyl for abdominal cramping/tightness. Push fluids. CMP/lipase, CBC with diff obtained will discharge pending results. Strict return precautions given. Patient expresses understanding and agrees to plan.   Final Clinical Impressions(s) / UC Diagnoses   Final diagnoses:  Nausea vomiting and diarrhea  Right upper quadrant abdominal pain    ED Discharge Orders        Ordered    ondansetron (ZOFRAN ODT) 4 MG disintegrating tablet  Every 8 hours PRN     12/05/17 2012    dicyclomine (BENTYL) 20 MG tablet  2 times daily     12/05/17 2012        Lurline Idol 12/05/17 2028

## 2018-01-12 ENCOUNTER — Encounter (HOSPITAL_COMMUNITY): Payer: Self-pay | Admitting: Family Medicine

## 2018-01-12 ENCOUNTER — Ambulatory Visit (HOSPITAL_COMMUNITY)
Admission: EM | Admit: 2018-01-12 | Discharge: 2018-01-12 | Disposition: A | Discharge status: Home / Self Care | Payer: Self-pay | Attending: Family Medicine | Admitting: Family Medicine

## 2018-01-12 DIAGNOSIS — J302 Other seasonal allergic rhinitis: Secondary | ICD-10-CM

## 2018-01-12 DIAGNOSIS — H10503 Unspecified blepharoconjunctivitis, bilateral: Secondary | ICD-10-CM

## 2018-01-12 MED ORDER — PREDNISONE 20 MG PO TABS: ORAL_TABLET | ORAL | 0 refills | Status: DC

## 2018-01-12 MED ORDER — POLYMYXIN B-TRIMETHOPRIM 10000-0.1 UNIT/ML-% OP SOLN: 1.0000 [drp] | OPHTHALMIC | 0 refills | Status: DC

## 2018-01-12 MED FILL — predniSONE 20 MG TABS: 20 | 5 days supply | Qty: 10 | Fill #0

## 2018-01-12 MED FILL — POLYMYXIN B/TMP EYE DROPS: 10000-0.1 | 13 days supply | Qty: 10 | Fill #0

## 2018-01-12 NOTE — ED Triage Notes (Signed)
Pt here for bilateral eye redness x 2 weeks. She has been using OTC eye drops. She also reports some drainage in her throat .

## 2018-01-12 NOTE — ED Provider Notes (Signed)
[facility]   [id number] 01/12/18 Arrival Time: 1301   SUBJECTIVE:  Karina Murphy is a 37 y.o. female who presents to the urgent care with complaint of bilateral eye redness x 2 weeks. She has been using OTC eye drops. She also reports some drainage in her throat .  She works at [employer] and was sent home by her Production designer, theatre/television/film.  Her lids are stuck shut in the morning.  She is having some chest tightness.  Past Medical History:  Diagnosis Date  . Anxiety   . Arthritis, rheumatoid (HCC)   . Chronic headaches   . Hypertension   . Urticaria    Family History  Problem Relation Age of Onset  . Diabetes Mother   . Hypertension Mother   . Stomach cancer Neg Hx   . Colon cancer Neg Hx    Social History   Socioeconomic History  . Marital status: Single    Spouse name: Not on file  . Number of children: 2  . Years of education: Not on file  . Highest education level: Not on file  Occupational History  . Not on file  Social Needs  . Financial resource strain: Not on file  . Food insecurity:    Worry: Not on file    Inability: Not on file  . Transportation needs:    Medical: Not on file    Non-medical: Not on file  Tobacco Use  . Smoking status: Never Smoker  . Smokeless tobacco: Never Used  Substance and Sexual Activity  . Alcohol use: No  . Drug use: No  . Sexual activity: Not Currently    Birth control/protection: Condom  Lifestyle  . Physical activity:    Days per week: Not on file    Minutes per session: Not on file  . Stress: Not on file  Relationships  . Social connections:    Talks on phone: Not on file    Gets together: Not on file    Attends religious service: Not on file    Active member of club or organization: Not on file    Attends meetings of clubs or organizations: Not on file    Relationship status: Not on file  . Intimate partner violence:    Fear of current or ex partner: Not on file    Emotionally abused: Not on file   Physically abused: Not on file    Forced sexual activity: Not on file  Other Topics Concern  . Not on file  Social History Narrative   Completed 2nd grade.    No outpatient medications have been marked as taking for the 01/12/18 encounter [facility] Encounter).   Allergies  Allergen Reactions  . Eggs Or Egg-Derived Products Nausea And Vomiting    Nausea/vomitting only with whole eggs, no problem with eggs used in ingredients  . Pineapple       ROS: As per HPI, remainder of ROS negative.   OBJECTIVE:   Vitals:   01/12/18 1337  BP: 114/70  Pulse: 74  Resp: 16  Temp: 98.7 F (37.1 C)  TempSrc: Oral  SpO2: 100%     General appearance: alert; no distress Eyes: PERRL; EOMI; conjunctiva diffusely red with mildly swollen lids showing dried exudates along the margins HENT: normocephalic; atraumatic; TMs normal, canal normal, external ears normal without trauma; nasal mucosa normal; oral mucosa normal Neck: supple Lungs: clear to auscultation bilaterally Heart: regular rate and rhythm Back: no CVA tenderness Extremities: no cyanosis or edema; symmetrical with no  gross deformities Skin: warm and dry Neurologic: normal gait; grossly normal Psychological: alert and cooperative; normal mood and affect      Labs:  Results for orders placed or performed during the hospital encounter of 12/05/17  Comprehensive metabolic panel  Result Value Ref Range   Sodium 139 135 - 145 mmol/L   Potassium 4.0 3.5 - 5.1 mmol/L   Chloride 103 101 - 111 mmol/L   CO2 23 22 - 32 mmol/L   Glucose, Bld 93 65 - 99 mg/dL   BUN 11 6 - 20 mg/dL   Creatinine, Ser 6.37 0.44 - 1.00 mg/dL   Calcium 9.3 8.9 - 85.8 mg/dL   Total Protein 7.5 6.5 - 8.1 g/dL   Albumin 3.7 3.5 - 5.0 g/dL   AST 17 15 - 41 U/L   ALT 11 (L) 14 - 54 U/L   Alkaline Phosphatase 68 38 - 126 U/L   Total Bilirubin 0.9 0.3 - 1.2 mg/dL   GFR calc non Af Amer >60 >60 mL/min   GFR calc Af Amer >60 >60 mL/min   Anion gap 13 5 - 15   Lipase, blood  Result Value Ref Range   Lipase 25 11 - 51 U/L  CBC with Differential  Result Value Ref Range   WBC 9.2 4.0 - 10.5 K/uL   RBC 4.33 3.87 - 5.11 MIL/uL   Hemoglobin 12.3 12.0 - 15.0 g/dL   HCT 85.0 27.7 - 41.2 %   MCV 86.8 78.0 - 100.0 fL   MCH 28.4 26.0 - 34.0 pg   MCHC 32.7 30.0 - 36.0 g/dL   RDW 87.8 67.6 - 72.0 %   Platelets 375 150 - 400 K/uL   Neutrophils Relative % 72 %   Neutro Abs 6.6 1.7 - 7.7 K/uL   Lymphocytes Relative 23 %   Lymphs Abs 2.1 0.7 - 4.0 K/uL   Monocytes Relative 4 %   Monocytes Absolute 0.4 0.1 - 1.0 K/uL   Eosinophils Relative 1 %   Eosinophils Absolute 0.1 0.0 - 0.7 K/uL   Basophils Relative 0 %   Basophils Absolute 0.0 0.0 - 0.1 K/uL    Labs Reviewed - No data to display  No results found.     ASSESSMENT & PLAN:  1. Blepharoconjunctivitis of both eyes, unspecified blepharoconjunctivitis type   2. Seasonal allergies     Meds ordered this encounter  Medications  . trimethoprim-polymyxin b (POLYTRIM) ophthalmic solution    Sig: Place 1 drop into both eyes every 4 (four) hours.    Dispense:  10 mL    Refill:  0  . predniSONE (DELTASONE) 20 MG tablet    Sig: Two daily with food    Dispense:  10 tablet    Refill:  0    Reviewed expectations re: course of current medical issues. Questions answered. Outlined signs and symptoms indicating need for more acute intervention. Patient verbalized understanding. After Visit Summary given.    Procedures:      Elvina Sidle, MD 01/12/18 1353

## 2018-02-23 ENCOUNTER — Encounter (HOSPITAL_COMMUNITY): Payer: Self-pay

## 2018-02-23 ENCOUNTER — Ambulatory Visit (HOSPITAL_COMMUNITY)
Admission: EM | Admit: 2018-02-23 | Discharge: 2018-02-23 | Disposition: A | Discharge status: Home / Self Care | Payer: Self-pay | Attending: Family Medicine | Admitting: Family Medicine

## 2018-02-23 DIAGNOSIS — E669 Obesity, unspecified: Secondary | ICD-10-CM | POA: Insufficient documentation

## 2018-02-23 DIAGNOSIS — J029 Acute pharyngitis, unspecified: Secondary | ICD-10-CM | POA: Insufficient documentation

## 2018-02-23 DIAGNOSIS — I10 Essential (primary) hypertension: Secondary | ICD-10-CM | POA: Insufficient documentation

## 2018-02-23 DIAGNOSIS — Z7952 Long term (current) use of systemic steroids: Secondary | ICD-10-CM | POA: Insufficient documentation

## 2018-02-23 DIAGNOSIS — Z8249 Family history of ischemic heart disease and other diseases of the circulatory system: Secondary | ICD-10-CM | POA: Insufficient documentation

## 2018-02-23 DIAGNOSIS — Z79899 Other long term (current) drug therapy: Secondary | ICD-10-CM | POA: Insufficient documentation

## 2018-02-23 DIAGNOSIS — R51 Headache: Secondary | ICD-10-CM | POA: Insufficient documentation

## 2018-02-23 DIAGNOSIS — M069 Rheumatoid arthritis, unspecified: Secondary | ICD-10-CM | POA: Insufficient documentation

## 2018-02-23 DIAGNOSIS — F419 Anxiety disorder, unspecified: Secondary | ICD-10-CM | POA: Insufficient documentation

## 2018-02-23 LAB — POCT RAPID STREP A: STREPTOCOCCUS, GROUP A SCREEN (DIRECT): NEGATIVE

## 2018-02-23 LAB — POCT INFECTIOUS MONO SCREEN: Mono Screen: NEGATIVE

## 2018-02-23 MED ORDER — IBUPROFEN 800 MG PO TABS: 800.0000 mg | ORAL_TABLET | Freq: Three times a day (TID) | ORAL | 0 refills | Status: DC

## 2018-02-23 MED ORDER — ONDANSETRON 4 MG PO TBDP: 4.0000 mg | ORAL_TABLET | Freq: Three times a day (TID) | ORAL | 0 refills | Status: DC | PRN

## 2018-02-23 NOTE — Discharge Instructions (Addendum)
Please increase the amount of water you are drinking. Warm tea, broth, throat lozenges, jello to increase fluid intake. Tylenol and/or ibuprofen as needed for pain or fevers.   Symptoms will likely improve on their own in the next 5-7 days. If symptoms worsen or do not improve in the next week to return to be seen or to follow up with your PCP.

## 2018-02-23 NOTE — ED Triage Notes (Signed)
Pt complains of chills, headache, pain in ear, throat pain and body aches.

## 2018-02-23 NOTE — ED Provider Notes (Signed)
[facility]    CSN: [id number] Arrival date & time: 02/23/18  1136     History   Chief Complaint Chief Complaint  Patient presents with  . flu like symptoms    HPI Karina Murphy is a 37 y.o. female.   Karina Murphy presents with complaints of fevers, sore throat, headache and nausea which started 4 days ago. Left ear pain. Decreased intake due to throat pain. No rash. Has not taken any medications for symptoms. No known ill contacts. No diarrhea. No vomiting. Hx of ra, not on any medications currently.   Spanish video interpreter used to collect history and physical exam.     ROS per HPI.      Past Medical History:  Diagnosis Date  . Anxiety   . Arthritis, rheumatoid (HCC)   . Chronic headaches   . Hypertension   . Urticaria     Patient Active Problem List   Diagnosis Date Noted  . Weakness 11/23/2016  . Breast pain, left 09/17/2016  . Abdominal pain, epigastric 07/27/2016  . Rheumatoid arthritis (HCC) 02/12/2014  . Obese 12/26/2013    Past Surgical History:  Procedure Laterality Date  . NO PAST SURGERIES      OB History   None      Home Medications    Prior to Admission medications   Medication Sig Start Date End Date Taking? Authorizing Provider  ibuprofen (ADVIL,MOTRIN) 800 MG tablet Take 1 tablet (800 mg total) by mouth 3 (three) times daily. 02/23/18   Georgetta Haber, NP  ondansetron (ZOFRAN-ODT) 4 MG disintegrating tablet Take 1 tablet (4 mg total) by mouth every 8 (eight) hours as needed for nausea or vomiting. 02/23/18   Georgetta Haber, NP  predniSONE (DELTASONE) 20 MG tablet Two daily with food 01/12/18   Elvina Sidle, MD  trimethoprim-polymyxin b (POLYTRIM) ophthalmic solution Place 1 drop into both eyes every 4 (four) hours. 01/12/18   Elvina Sidle, MD    Family History Family History  Problem Relation Age of Onset  . Diabetes Mother   . Hypertension Mother   . Stomach cancer Neg Hx   . Colon cancer Neg Hx      Social History Social History   Tobacco Use  . Smoking status: Never Smoker  . Smokeless tobacco: Never Used  Substance Use Topics  . Alcohol use: No  . Drug use: No     Allergies   Eggs or egg-derived products and Pineapple   Review of Systems Review of Systems   Physical Exam Triage Vital Signs ED Triage Vitals  Enc Vitals Group     BP 02/23/18 1255 114/81     Pulse --      Resp 02/23/18 1255 18     Temp 02/23/18 1255 98.7 F (37.1 C)     Temp Source 02/23/18 1255 Oral     SpO2 02/23/18 1255 98 %     Weight --      Height --      Head Circumference --      Peak Flow --      Pain Score 02/23/18 1251 10     Pain Loc --      Pain Edu? --      Excl. in GC? --    No data found.  Updated Vital Signs BP 114/81 (BP Location: Left Arm)   Temp 98.7 F (37.1 C) (Oral)   Resp 18   LMP 02/13/2018   SpO2 98%    Physical Exam  Constitutional: She is oriented to person, place, and time. She appears well-developed and well-nourished. No distress.  HENT:  Head: Normocephalic and atraumatic.  Right Ear: Tympanic membrane, external ear and ear canal normal.  Left Ear: Tympanic membrane, external ear and ear canal normal.  Nose: Nose normal.  Mouth/Throat: Uvula is midline and oropharynx is clear and moist. Mucous membranes are dry. No tonsillar exudate.  Eyes: Pupils are equal, round, and reactive to light. Conjunctivae and EOM are normal.  Cardiovascular: Normal rate, regular rhythm and normal heart sounds.  Pulmonary/Chest: Effort normal and breath sounds normal.  Lymphadenopathy:    She has cervical adenopathy.  Neurological: She is alert and oriented to person, place, and time.  Skin: Skin is warm and dry.     UC Treatments / Results  Labs (all labs ordered are listed, but only abnormal results are displayed) Labs Reviewed  CULTURE, GROUP A STREP [facility])  POCT RAPID STREP A  POCT INFECTIOUS MONO SCREEN    EKG None  Radiology No results  found.  Procedures Procedures (including critical care time)  Medications Ordered in UC Medications - No data to display  Initial Impression / Assessment and Plan / UC Course  I have reviewed the triage vital signs and the nursing notes.  Pertinent labs & imaging results that were available during my care of the patient were reviewed by me and considered in my medical decision making (see chart for details).     Negative rapid strep, culture pending. Afebrile in clinic today. Negative mono. History and physical consistent with viral illness.  Supportive cares recommended. Encouraged increased fluid intake. Return precautions provided. Patient verbalized understanding and agreeable to plan.   Final Clinical Impressions(s) / UC Diagnoses   Final diagnoses:  Acute pharyngitis, unspecified etiology     Discharge Instructions     Please increase the amount of water you are drinking. Warm tea, broth, throat lozenges, jello to increase fluid intake. Tylenol and/or ibuprofen as needed for pain or fevers.   Symptoms will likely improve on their own in the next 5-7 days. If symptoms worsen or do not improve in the next week to return to be seen or to follow up with your PCP.      ED Prescriptions    Medication Sig Dispense Auth. Provider   ondansetron (ZOFRAN-ODT) 4 MG disintegrating tablet Take 1 tablet (4 mg total) by mouth every 8 (eight) hours as needed for nausea or vomiting. 12 tablet Linus Mako B, NP   ibuprofen (ADVIL,MOTRIN) 800 MG tablet Take 1 tablet (800 mg total) by mouth 3 (three) times daily. 21 tablet Georgetta Haber, NP     Controlled Substance Prescriptions [place] Controlled Substance Registry consulted? Not Applicable   Georgetta Haber, NP 02/23/18 1401

## 2018-02-25 ENCOUNTER — Telehealth (HOSPITAL_COMMUNITY): Payer: Self-pay

## 2018-02-25 LAB — CULTURE, GROUP A STREP (THRC)

## 2018-02-25 MED ORDER — PENICILLIN V POTASSIUM 500 MG PO TABS: 500.0000 mg | ORAL_TABLET | Freq: Two times a day (BID) | ORAL | 0 refills | Status: AC

## 2018-02-25 NOTE — Telephone Encounter (Signed)
Culture is positive for group A Strep germ.  Prescription for penicillin V 500mg  bid x 10d #20 no refills sent to the pharmacy of record. Attempted to reach patient. No answer and no voicemail set up.

## 2018-03-01 ENCOUNTER — Encounter (HOSPITAL_COMMUNITY): Payer: Self-pay | Admitting: Family Medicine

## 2018-03-01 ENCOUNTER — Ambulatory Visit (HOSPITAL_COMMUNITY)
Admission: EM | Admit: 2018-03-01 | Discharge: 2018-03-01 | Disposition: A | Discharge status: Home / Self Care | Payer: Self-pay | Attending: Internal Medicine | Admitting: Internal Medicine

## 2018-03-01 DIAGNOSIS — M79641 Pain in right hand: Secondary | ICD-10-CM

## 2018-03-01 MED ORDER — PREDNISONE 10 MG PO TABS: 10.0000 mg | ORAL_TABLET | Freq: Every day | ORAL | 0 refills | Status: DC

## 2018-03-01 NOTE — ED Triage Notes (Signed)
Pt here for severe right hand pain and swelling since Friday. She has hx of RA and takes ibuprofen and tylenol. No injury.

## 2018-03-01 NOTE — Discharge Instructions (Addendum)
Please take prednisone taper as prescribed.  After completing prednisone taper you may resume ibuprofen.  Please apply ice to the right hand 20 minutes every hour and follow-up with primary care provider rheumatoid physician

## 2018-03-01 NOTE — ED Provider Notes (Signed)
[facility]    CSN: [id number] Arrival date & time: 03/01/18  1804     History   Chief Complaint Chief Complaint  Patient presents with  . Hand Pain    HPI Karina Murphy is a 37 y.o. female.   Presents to the urgent care facility omeprazole for reflux for evaluation of right hand pain.  Patient states she was diagnosed with rheumatoid arthritis 3 years ago.  She has occasional flareups of right hand pain but has been doing well up until 3 days ago when she developed swelling and pain throughout the right hand.  She has chronic deformity to the right third digit.  She denies any recent trauma or injury.  There is no numbness or tingling.  No warmth or redness.  Patient denies any forearm pain.  She is right-hand dominant.  She works as a Child psychotherapist.  She has been working.  She has been taking ibuprofen with minimal relief.  Pain is currently 3 out of 10.  HPI  Past Medical History:  Diagnosis Date  . Anxiety   . Arthritis, rheumatoid (HCC)   . Chronic headaches   . Hypertension   . Urticaria     Patient Active Problem List   Diagnosis Date Noted  . Weakness 11/23/2016  . Breast pain, left 09/17/2016  . Abdominal pain, epigastric 07/27/2016  . Rheumatoid arthritis (HCC) 02/12/2014  . Obese 12/26/2013    Past Surgical History:  Procedure Laterality Date  . NO PAST SURGERIES      OB History   None      Home Medications    Prior to Admission medications   Medication Sig Start Date End Date Taking? Authorizing Provider  ibuprofen (ADVIL,MOTRIN) 800 MG tablet Take 1 tablet (800 mg total) by mouth 3 (three) times daily. 02/23/18   Georgetta Haber, NP  ondansetron (ZOFRAN-ODT) 4 MG disintegrating tablet Take 1 tablet (4 mg total) by mouth every 8 (eight) hours as needed for nausea or vomiting. 02/23/18   Georgetta Haber, NP  penicillin v potassium (VEETID) 500 MG tablet Take 1 tablet (500 mg total) by mouth 2 (two) times daily for 10 days. 02/25/18  03/07/18  Isa Rankin, MD  predniSONE (DELTASONE) 10 MG tablet Take 1 tablet (10 mg total) by mouth daily. 6,5,4,3,2,1 six day taper 03/01/18   Evon Slack, PA-C  trimethoprim-polymyxin b (POLYTRIM) ophthalmic solution Place 1 drop into both eyes every 4 (four) hours. 01/12/18   Elvina Sidle, MD    Family History Family History  Problem Relation Age of Onset  . Diabetes Mother   . Hypertension Mother   . Stomach cancer Neg Hx   . Colon cancer Neg Hx     Social History Social History   Tobacco Use  . Smoking status: Never Smoker  . Smokeless tobacco: Never Used  Substance Use Topics  . Alcohol use: No  . Drug use: No     Allergies   Eggs or egg-derived products and Pineapple   Review of Systems Review of Systems  Constitutional: Negative for activity change.  Eyes: Negative for pain and visual disturbance.  Respiratory: Negative for shortness of breath.   Genitourinary: Negative for flank pain and pelvic pain.  Musculoskeletal: Positive for arthralgias and joint swelling. Negative for gait problem, myalgias, neck pain and neck stiffness.  Skin: Negative for wound.  Neurological: Negative for weakness and numbness.     Physical Exam Triage Vital Signs ED Triage Vitals  Enc Vitals Group  BP 03/01/18 1847 118/67     Pulse Rate 03/01/18 1847 70     Resp 03/01/18 1847 18     Temp 03/01/18 1847 98.5 F (36.9 C)     Temp src --      SpO2 03/01/18 1847 100 %     Weight --      Height --      Head Circumference --      Peak Flow --      Pain Score 03/01/18 1846 7     Pain Loc --      Pain Edu? --      Excl. in GC? --    No data found.  Updated Vital Signs BP 118/67   Pulse 70   Temp 98.5 F (36.9 C)   Resp 18   LMP 02/13/2018   SpO2 100%   Visual Acuity Right Eye Distance:   Left Eye Distance:   Bilateral Distance:    Right Eye Near:   Left Eye Near:    Bilateral Near:     Physical Exam  Constitutional: She is oriented to  person, place, and time. She appears well-developed and well-nourished.  HENT:  Head: Normocephalic and atraumatic.  Eyes: Conjunctivae are normal.  Neck: Normal range of motion.  Cardiovascular: Normal rate.  Pulmonary/Chest: Effort normal. No respiratory distress.  Musculoskeletal: Normal range of motion.  Right hand is swollen with deformity to the third digit consistent with swan-neck deformity.  Patient states this deformity is chronic.  Compartments are soft no warmth or redness.  There is no sign of any abscess formation or cellulitis.  She has some deformity throughout the right hand digits.  She is close to making a full fist but is limited due to swelling.  Neurological: She is alert and oriented to person, place, and time.  Skin: Skin is warm. No rash noted.  Psychiatric: She has a normal mood and affect. Her behavior is normal. Thought content normal.     UC Treatments / Results  Labs (all labs ordered are listed, but only abnormal results are displayed) Labs Reviewed - No data to display  EKG None  Radiology No results found.  Procedures Procedures (including critical care time)  Medications Ordered in UC Medications - No data to display  Initial Impression / Assessment and Plan / UC Course  I have reviewed the triage vital signs and the nursing notes.  Pertinent labs & imaging results that were available during my care of the patient were reviewed by me and considered in my medical decision making (see chart for details).     38 year old female with rheumatoid arthritis presents with acute exacerbation of the right hand with pain and swelling.  No sign of compartment syndrome, cellulitis.  She started on 60 steroid taper and then will resume ibuprofen.  She will follow-up with PCP or rheumatoid physician if no improvement in 1 week. Final Clinical Impressions(s) / UC Diagnoses   Final diagnoses:  Right hand pain     Discharge Instructions     Please take  prednisone taper as prescribed.  After completing prednisone taper you may resume ibuprofen.  Please apply ice to the right hand 20 minutes every hour and follow-up with primary care provider rheumatoid physician   ED Prescriptions    Medication Sig Dispense Auth. Provider   predniSONE (DELTASONE) 10 MG tablet Take 1 tablet (10 mg total) by mouth daily. 6,5,4,3,2,1 six day taper 21 tablet Evon Slack, [place]  Evon Slack, [place] 03/01/18 1921

## 2018-03-02 MED FILL — PENICILLIN VK 500 MG TABLET: 500 | 10 days supply | Qty: 20 | Fill #0

## 2018-03-03 ENCOUNTER — Ambulatory Visit: Payer: Self-pay | Attending: Nurse Practitioner

## 2018-03-06 MED FILL — OMEPRAZOLE DR 40 MG CAPSULE: 40 | 30 days supply | Qty: 30 | Fill #0

## 2018-04-03 ENCOUNTER — Ambulatory Visit: Payer: Self-pay | Attending: Nurse Practitioner

## 2018-04-13 ENCOUNTER — Encounter (HOSPITAL_COMMUNITY): Payer: Self-pay | Admitting: Emergency Medicine

## 2018-04-13 ENCOUNTER — Ambulatory Visit (HOSPITAL_COMMUNITY)
Admission: EM | Admit: 2018-04-13 | Discharge: 2018-04-13 | Disposition: A | Discharge status: Home / Self Care | Payer: Self-pay

## 2018-04-13 ENCOUNTER — Other Ambulatory Visit: Payer: Self-pay

## 2018-04-13 DIAGNOSIS — R21 Rash and other nonspecific skin eruption: Secondary | ICD-10-CM | POA: Insufficient documentation

## 2018-04-13 DIAGNOSIS — J029 Acute pharyngitis, unspecified: Secondary | ICD-10-CM | POA: Insufficient documentation

## 2018-04-13 DIAGNOSIS — I10 Essential (primary) hypertension: Secondary | ICD-10-CM | POA: Insufficient documentation

## 2018-04-13 DIAGNOSIS — F419 Anxiety disorder, unspecified: Secondary | ICD-10-CM | POA: Insufficient documentation

## 2018-04-13 DIAGNOSIS — Z79899 Other long term (current) drug therapy: Secondary | ICD-10-CM | POA: Insufficient documentation

## 2018-04-13 LAB — POCT RAPID STREP A: Streptococcus, Group A Screen (Direct): NEGATIVE

## 2018-04-13 MED ORDER — METHYLPREDNISOLONE SODIUM SUCC 125 MG IJ SOLR
80.0000 mg | Freq: Once | INTRAMUSCULAR | Status: AC
  Administered 2018-04-13: 80 mg via INTRAMUSCULAR

## 2018-04-13 MED ORDER — TRIAMCINOLONE ACETONIDE 0.1 % EX CREA: 1.0000 "application " | TOPICAL_CREAM | Freq: Two times a day (BID) | CUTANEOUS | 0 refills | Status: DC

## 2018-04-13 MED ORDER — METHYLPREDNISOLONE SODIUM SUCC 125 MG IJ SOLR
INTRAMUSCULAR | Status: AC
  Filled 2018-04-13: qty 2

## 2018-04-13 MED ORDER — CETIRIZINE HCL 5 MG PO TABS: 5.0000 mg | ORAL_TABLET | Freq: Every day | ORAL | 1 refills | Status: DC

## 2018-04-13 MED ORDER — DIPHENHYDRAMINE HCL 25 MG PO TABS: 25.0000 mg | ORAL_TABLET | Freq: Four times a day (QID) | ORAL | 0 refills | Status: DC

## 2018-04-13 MED FILL — TRIAMCINOLONE 0.1% CREAM: 0.1 | 15 days supply | Qty: 30 | Fill #0

## 2018-04-13 NOTE — ED Provider Notes (Signed)
[facility]    CSN: [id number] Arrival date & time: 04/13/18  1345     History   Chief Complaint Chief Complaint  Patient presents with  . Sore Throat    HPI Karina Murphy is a 37 y.o. female.   Patient is a 37 year old female presents with 2 weeks of bilateral rash to both arms around the elbow area.  The rash is very itchy, edematous and spreading.  She has been using cortisone cream for the rash.  Denies any fever, joint pain. Denies any recent changes in lotions, detergents, foods or other possible irritants. No recent travel. Nobody else at home has the rash. Patient has been outside but denies any contact with plants or insects.    Also about 1 week ago she started having sore throat, cough, congestion.   ROS per HPI      Past Medical History:  Diagnosis Date  . Anxiety   . Arthritis, rheumatoid (HCC)   . Chronic headaches   . Hypertension   . Urticaria     Patient Active Problem List   Diagnosis Date Noted  . Weakness 11/23/2016  . Breast pain, left 09/17/2016  . Abdominal pain, epigastric 07/27/2016  . Rheumatoid arthritis (HCC) 02/12/2014  . Obese 12/26/2013    Past Surgical History:  Procedure Laterality Date  . NO PAST SURGERIES      OB History   None      Home Medications    Prior to Admission medications   Medication Sig Start Date End Date Taking? Authorizing Provider  omeprazole (PRILOSEC) 20 MG capsule Take 20 mg by mouth daily.   Yes [provider]  cetirizine (ZYRTEC) 5 MG tablet Take 1 tablet (5 mg total) by mouth daily. 04/13/18   Dahlia Byes A, NP  diphenhydrAMINE (BENADRYL) 25 MG tablet Take 1 tablet (25 mg total) by mouth every 6 (six) hours. 04/13/18   Dahlia Byes A, NP  ibuprofen (ADVIL,MOTRIN) 800 MG tablet Take 1 tablet (800 mg total) by mouth 3 (three) times daily. 02/23/18   Georgetta Haber, NP  ondansetron (ZOFRAN-ODT) 4 MG disintegrating tablet Take 1 tablet (4 mg total) by mouth every 8 (eight)  hours as needed for nausea or vomiting. 02/23/18   Georgetta Haber, NP  predniSONE (DELTASONE) 10 MG tablet Take 1 tablet (10 mg total) by mouth daily. 6,5,4,3,2,1 six day taper 03/01/18   Evon Slack, PA-C  triamcinolone cream (KENALOG) 0.1 % Apply 1 application topically 2 (two) times daily. 04/13/18   Dahlia Byes A, NP  trimethoprim-polymyxin b (POLYTRIM) ophthalmic solution Place 1 drop into both eyes every 4 (four) hours. 01/12/18   Elvina Sidle, MD    Family History Family History  Problem Relation Age of Onset  . Diabetes Mother   . Hypertension Mother   . Stomach cancer Neg Hx   . Colon cancer Neg Hx     Social History Social History   Tobacco Use  . Smoking status: Never Smoker  . Smokeless tobacco: Never Used  Substance Use Topics  . Alcohol use: No  . Drug use: No     Allergies   Eggs or egg-derived products and Pineapple   Review of Systems Review of Systems   Physical Exam Triage Vital Signs ED Triage Vitals  Enc Vitals Group     BP 04/13/18 1408 119/76     Pulse Rate 04/13/18 1408 88     Resp 04/13/18 1408 16     Temp 04/13/18 1408  99.6 F (37.6 C)     Temp Source 04/13/18 1408 Oral     SpO2 04/13/18 1408 100 %     Weight --      Height --      Head Circumference --      Peak Flow --      Pain Score 04/13/18 1405 8     Pain Loc --      Pain Edu? --      Excl. in GC? --    No data found.  Updated Vital Signs BP 119/76 (BP Location: Left Arm)   Pulse 88   Temp 99.6 F (37.6 C) (Oral)   Resp 16   LMP 03/26/2018   SpO2 100%   Visual Acuity Right Eye Distance:   Left Eye Distance:   Bilateral Distance:    Right Eye Near:   Left Eye Near:    Bilateral Near:     Physical Exam  Constitutional: She appears well-developed and well-nourished.  Non-toxic appearance. She does not appear ill. No distress.  HENT:  Head: Normocephalic and atraumatic.  Right Ear: Hearing and tympanic membrane normal. No drainage, swelling or tenderness.    Left Ear: Hearing and tympanic membrane normal. No drainage, swelling or tenderness.  Mouth/Throat: Uvula is midline, oropharynx is clear and moist and mucous membranes are normal. No oral lesions. No uvula swelling. No oropharyngeal exudate, posterior oropharyngeal edema, posterior oropharyngeal erythema or tonsillar abscesses. Tonsils are 0 on the right. Tonsils are 0 on the left. No tonsillar exudate.  Cardiovascular: Normal rate and normal heart sounds.  Pulmonary/Chest: Effort normal and breath sounds normal.  Neurological: She is alert.  Skin: Skin is warm and dry. Capillary refill takes less than 2 seconds.  Bilateral maculopapular rash with a few vesicles to elbows spreading proximally up arm. Healing rash to abdomen.   Psychiatric: She has a normal mood and affect.  Nursing note and vitals reviewed.    UC Treatments / Results  Labs (all labs ordered are listed, but only abnormal results are displayed) Labs Reviewed  CULTURE, GROUP A STREP [facility])  POCT RAPID STREP A    EKG None  Radiology No results found.  Procedures Procedures (including critical care time)  Medications Ordered in UC Medications  methylPREDNISolone sodium succinate (SOLU-MEDROL) 125 mg/2 mL injection 80 mg (80 mg Intramuscular Given 04/13/18 1436)    Initial Impression / Assessment and Plan / UC Course  I have reviewed the triage vital signs and the nursing notes.  Pertinent labs & imaging results that were available during my care of the patient were reviewed by me and considered in my medical decision making (see chart for details).     Steroid injection for the rash. Appears to be some sort of contact dermatitis.  May also help with the inflammation in her throat. Triamcinolone cream for the rash. Zyrtec daily and benadryl as needed.  Final Clinical Impressions(s) / UC Diagnoses   Final diagnoses:  Viral pharyngitis  Rash and nonspecific skin eruption     Discharge Instructions     It  was nice meeting you!!  We gave you a steroid injection in the clinic for the rash.  I would like to you to start taking a daily zyrtec for allergies. This could also help with the itching.  You may also take benadryl as needed for the itching.  Follow up with your PCP if no improvement in the next week.      ED Prescriptions  Medication Sig Dispense Auth. Provider   triamcinolone cream (KENALOG) 0.1 % Apply 1 application topically 2 (two) times daily. 30 g Lilana Blasko A, NP   cetirizine (ZYRTEC) 5 MG tablet Take 1 tablet (5 mg total) by mouth daily. 30 tablet Carlesha Seiple A, NP   diphenhydrAMINE (BENADRYL) 25 MG tablet Take 1 tablet (25 mg total) by mouth every 6 (six) hours. 20 tablet Dahlia Byes A, NP     Controlled Substance Prescriptions [place] Controlled Substance Registry consulted? Not Applicable   Janace Aris, NP 04/14/18 [phone]

## 2018-04-13 NOTE — ED Triage Notes (Addendum)
Sore throat for a week.  Patient has had a slight cough and phlegm, congestion.  No known fever, sometimes feels congested in chest and hard to breathe in for a deep breath.   Bilateral arms with rash, torso with rash.  Patient reports this rash itches a lot Patient reports diarrhea

## 2018-04-13 NOTE — Discharge Instructions (Addendum)
It was nice meeting you!!  We gave you a steroid injection in the clinic for the rash.  I would like to you to start taking a daily zyrtec for allergies. This could also help with the itching.  You may also take benadryl as needed for the itching.  Follow up with your PCP if no improvement in the next week.

## 2018-04-15 LAB — CULTURE, GROUP A STREP (THRC)

## 2018-04-18 ENCOUNTER — Telehealth (HOSPITAL_COMMUNITY): Payer: Self-pay

## 2018-04-18 MED ORDER — PENICILLIN V POTASSIUM 500 MG PO TABS: 500.0000 mg | ORAL_TABLET | Freq: Two times a day (BID) | ORAL | 0 refills | Status: AC

## 2018-04-18 NOTE — Telephone Encounter (Signed)
Culture is positive for group A Strep germ.  Prescription for penicillin V 500mg bid x 10d #20 no refills sent to the pharmacy of record. Attempted to reach patient. No answer at this time. Voicemail left.    

## 2018-04-19 ENCOUNTER — Telehealth (HOSPITAL_COMMUNITY): Payer: Self-pay

## 2018-04-19 NOTE — Telephone Encounter (Signed)
Attempted to reach patient x 2 

## 2018-04-21 ENCOUNTER — Telehealth (HOSPITAL_COMMUNITY): Payer: Self-pay

## 2018-04-21 NOTE — Telephone Encounter (Signed)
Attempted to reach patient x 3. No answer. Letter sent. 

## 2018-05-03 ENCOUNTER — Ambulatory Visit: Payer: Self-pay | Attending: Nurse Practitioner | Admitting: Nurse Practitioner

## 2018-05-03 ENCOUNTER — Encounter: Payer: Self-pay | Admitting: Nurse Practitioner

## 2018-05-03 VITALS — BP 108/76 | HR 80 | Temp 99.1°F | Ht 64.0 in | Wt 191.6 lb

## 2018-05-03 DIAGNOSIS — K219 Gastro-esophageal reflux disease without esophagitis: Secondary | ICD-10-CM | POA: Insufficient documentation

## 2018-05-03 DIAGNOSIS — M069 Rheumatoid arthritis, unspecified: Secondary | ICD-10-CM | POA: Insufficient documentation

## 2018-05-03 DIAGNOSIS — Z79899 Other long term (current) drug therapy: Secondary | ICD-10-CM | POA: Insufficient documentation

## 2018-05-03 DIAGNOSIS — M25541 Pain in joints of right hand: Secondary | ICD-10-CM | POA: Insufficient documentation

## 2018-05-03 DIAGNOSIS — M25562 Pain in left knee: Secondary | ICD-10-CM | POA: Insufficient documentation

## 2018-05-03 DIAGNOSIS — M25561 Pain in right knee: Secondary | ICD-10-CM | POA: Insufficient documentation

## 2018-05-03 DIAGNOSIS — J45909 Unspecified asthma, uncomplicated: Secondary | ICD-10-CM | POA: Insufficient documentation

## 2018-05-03 DIAGNOSIS — M25542 Pain in joints of left hand: Secondary | ICD-10-CM | POA: Insufficient documentation

## 2018-05-03 DIAGNOSIS — M05741 Rheumatoid arthritis with rheumatoid factor of right hand without organ or systems involvement: Secondary | ICD-10-CM

## 2018-05-03 DIAGNOSIS — R21 Rash and other nonspecific skin eruption: Secondary | ICD-10-CM | POA: Insufficient documentation

## 2018-05-03 DIAGNOSIS — Z8249 Family history of ischemic heart disease and other diseases of the circulatory system: Secondary | ICD-10-CM | POA: Insufficient documentation

## 2018-05-03 MED ORDER — ALBUTEROL SULFATE HFA 108 (90 BASE) MCG/ACT IN AERS: 2.0000 | INHALATION_SPRAY | Freq: Four times a day (QID) | RESPIRATORY_TRACT | 2 refills | Status: DC | PRN

## 2018-05-03 MED ORDER — CEPHALEXIN 500 MG PO CAPS: 500.0000 mg | ORAL_CAPSULE | Freq: Two times a day (BID) | ORAL | 0 refills | Status: AC

## 2018-05-03 MED ORDER — MUPIROCIN CALCIUM 2 % EX CREA: 1.0000 "application " | TOPICAL_CREAM | Freq: Two times a day (BID) | CUTANEOUS | 1 refills | Status: DC

## 2018-05-03 MED ORDER — FLUTICASONE PROPIONATE 50 MCG/ACT NA SUSP: 1.0000 | Freq: Every day | NASAL | 1 refills | Status: DC

## 2018-05-03 MED ORDER — CETIRIZINE HCL 10 MG PO TABS: 10.0000 mg | ORAL_TABLET | Freq: Every day | ORAL | 1 refills | Status: DC

## 2018-05-03 MED ORDER — MUPIROCIN CALCIUM 2 % EX CREA: 1.0000 "application " | TOPICAL_CREAM | Freq: Two times a day (BID) | CUTANEOUS | 0 refills | Status: DC

## 2018-05-03 MED ORDER — MUPIROCIN 2 % EX OINT: 1.0000 "application " | TOPICAL_OINTMENT | Freq: Two times a day (BID) | CUTANEOUS | 1 refills | Status: DC

## 2018-05-03 MED ORDER — OMEPRAZOLE 20 MG PO CPDR
20.0000 mg | DELAYED_RELEASE_CAPSULE | Freq: Every day | ORAL | 1 refills | Status: DC
Start: 2018-05-03 — End: 2018-06-29

## 2018-05-03 MED FILL — OMEPRAZOLE 20 MG CPDR: 20 | 30 days supply | Qty: 30 | Fill #0

## 2018-05-03 MED FILL — CEPHALEXIN 500 MG CAPSULE: 500 | 7 days supply | Qty: 14 | Fill #0

## 2018-05-03 MED FILL — MUPIROCIN 2% OINTMENT: 2 | 5 days supply | Qty: 22 | Fill #0

## 2018-05-03 MED FILL — !VENTOLIN HFA INHALER: 108 (90 BAS | 25 days supply | Qty: 18 | Fill #0

## 2018-05-03 MED FILL — FLUTICASONE PROP 50 MCG SPR: 50 | 30 days supply | Qty: 16 | Fill #0

## 2018-05-03 MED FILL — ?CETIRIZINE HCL 10 MG TABLE: 10 | 30 days supply | Qty: 30 | Fill #0

## 2018-05-03 NOTE — Progress Notes (Signed)
Assessment & Plan:  Karina Murphy was seen today for hand pain.  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 -     fluticasone (FLONASE) 50 MCG/ACT nasal spray; Place 1 spray into both nostrils daily. -     albuterol (PROVENTIL HFA;VENTOLIN HFA) 108 (90 Base) MCG/ACT inhaler; Inhale 2 puffs into the lungs every 6 (six) hours as needed for wheezing or shortness of breath.  Skin rash -     mupirocin cream (BACTROBAN) 2 %; Apply 1 application topically 2 (two) times daily. -     cephALEXin (KEFLEX) 500 MG capsule; Take 1 capsule (500 mg total) by mouth 2 (two) times daily for 7 days.  Gastroesophageal reflux disease without esophagitis -     omeprazole (PRILOSEC) 20 MG capsule; Take 1 capsule (20 mg total) by mouth daily.    Patient has been counseled on age-appropriate routine health concerns for screening and prevention. These are reviewed and up-to-date. Referrals have been placed accordingly. Immunizations are up-to-date or declined.    Subjective:   Chief Complaint  Patient presents with  . Hand Pain    Pt. stated there are some days she have pain on both of her hands and knees. Pt. stated she have arthritis.    HPI Karina Murphy 37 y.o. female presents to office today for follow up to bilateral hand and knee pain. She has a history of RA (positive RF) which was diagnosed a few years ago (2016) by a rheumatology.   Rheumatoid Arthritis Patient complains of rheumatoid arthritis.  Symptoms have been present for several years.  Symptoms include joint pain, joint swelling and morning stiffness and are of moderate severity. Patient denies eye symptoms. Symptoms are made worse by: nothing  Overall disease activity:  worse. Limitation on  activities include difficulty with ADLs - holding objects. Previous medications include: Methotrexate, prednisone and sulfasalazine.  Asthma Chronic. Symptoms well controlled. Denies cough, wheezing or shortness of breath.   GERD Chronic. Symptoms well controlled on omeprazole 20mg  daily. GI workup has been normal.    Review of Systems  Constitutional: Negative for fever, malaise/fatigue and weight loss.  HENT: Negative.  Negative for nosebleeds.   Eyes: Negative.  Negative for blurred vision, double vision and photophobia.  Respiratory: Negative.  Negative for cough and shortness of breath.   Cardiovascular: Negative.  Negative for chest pain, palpitations and leg swelling.  Gastrointestinal: Positive for heartburn. Negative for nausea and vomiting.  Musculoskeletal: Negative.  Negative for joint pain and myalgias.       SEE HPI  Skin: Positive for itching and rash.  Neurological: Positive for headaches. Negative for dizziness, focal weakness and seizures.  Endo/Heme/Allergies: Positive for environmental allergies.  Psychiatric/Behavioral: Negative for suicidal ideas. The patient is nervous/anxious.     Past Medical History:  Diagnosis Date  . Anxiety   . Arthritis, rheumatoid (HCC)   . Chronic headaches   . Hypertension   . Urticaria     Past Surgical History:  Procedure Laterality Date  . NO PAST SURGERIES      Family History  Problem Relation Age of Onset  . Diabetes Mother   . Hypertension Mother   . Stomach cancer Neg Hx   . Colon cancer Neg Hx  Social History Reviewed with no changes to be made today.   Outpatient Medications Prior to Visit  Medication Sig Dispense Refill  . ibuprofen (ADVIL,MOTRIN) 800 MG tablet Take 1 tablet (800 mg total) by mouth 3 (three) times daily. 21 tablet 0  . omeprazole (PRILOSEC) 20 MG capsule Take 20 mg by mouth daily.    . ondansetron (ZOFRAN-ODT) 4 MG disintegrating tablet Take 1 tablet (4 mg total) by mouth every 8  (eight) hours as needed for nausea or vomiting. (Patient not taking: Reported on 05/03/2018) 12 tablet 0  . cetirizine (ZYRTEC) 5 MG tablet Take 1 tablet (5 mg total) by mouth daily. (Patient not taking: Reported on 05/03/2018) 30 tablet 1  . diphenhydrAMINE (BENADRYL) 25 MG tablet Take 1 tablet (25 mg total) by mouth every 6 (six) hours. (Patient not taking: Reported on 05/03/2018) 20 tablet 0  . predniSONE (DELTASONE) 10 MG tablet Take 1 tablet (10 mg total) by mouth daily. 6,5,4,3,2,1 six day taper (Patient not taking: Reported on 05/03/2018) 21 tablet 0  . triamcinolone cream (KENALOG) 0.1 % Apply 1 application topically 2 (two) times daily. (Patient not taking: Reported on 05/03/2018) 30 g 0  . trimethoprim-polymyxin b (POLYTRIM) ophthalmic solution Place 1 drop into both eyes every 4 (four) hours. (Patient not taking: Reported on 05/03/2018) 10 mL 0   No facility-administered medications prior to visit.     Allergies  Allergen Reactions  . Eggs Or Egg-Derived Products Nausea And Vomiting    Nausea/vomitting only with whole eggs, no problem with eggs used in ingredients  . Pineapple        Objective:    BP 108/76 (BP Location: Left Arm, Patient Position: Sitting, Cuff Size: Large)   Pulse 80   Temp 99.1 F (37.3 C) (Oral)   Ht 5\' 4"  (1.626 m)   Wt 191 lb 9.6 oz (86.9 kg)   LMP 04/22/2018   SpO2 99%   BMI 32.89 kg/m  Wt Readings from Last 3 Encounters:  05/03/18 191 lb 9.6 oz (86.9 kg)  11/25/17 178 lb (80.7 kg)  10/20/17 178 lb 3.2 oz (80.8 kg)    Physical Exam  Constitutional: She is oriented to person, place, and time. She appears well-developed and well-nourished. She is cooperative.  HENT:  Head: Normocephalic and atraumatic.  Nose: Mucosal edema and rhinorrhea present.  Eyes: EOM are normal.  Neck: Normal range of motion.  Cardiovascular: Normal rate, regular rhythm and normal heart sounds. Exam reveals no gallop and no friction rub.  No murmur heard. Pulmonary/Chest:  Effort normal and breath sounds normal. No tachypnea. No respiratory distress. She has no decreased breath sounds. She has no wheezes. She has no rhonchi. She has no rales. She exhibits no tenderness.  Abdominal: Bowel sounds are normal.  Musculoskeletal: Normal range of motion. She exhibits no edema.  Neurological: She is alert and oriented to person, place, and time. Coordination normal.  Skin: Skin is warm and dry. Rash noted. Rash is papular. There is erythema.  Diffuse excoriated erythematous papules on bilateral forearms.   Psychiatric: She has a normal mood and affect. Her behavior is normal. Judgment and thought content normal.  Nursing note and vitals reviewed.        Patient has been counseled extensively about nutrition and exercise as well as the importance of adherence with medications and regular follow-up. The patient was given clear instructions to go to ER or return to medical center if symptoms don't improve, worsen or new problems develop. The patient  verbalized understanding.   Follow-up: Return for PAP .   Claiborne Rigg, FNP-BC [facility] [place], [place] [phone]   05/03/2018, 4:03 PM

## 2018-05-05 ENCOUNTER — Other Ambulatory Visit: Payer: Self-pay | Admitting: Nurse Practitioner

## 2018-05-11 ENCOUNTER — Encounter: Payer: Self-pay | Admitting: Nurse Practitioner

## 2018-05-31 ENCOUNTER — Ambulatory Visit: Payer: Self-pay | Attending: Nurse Practitioner | Admitting: Nurse Practitioner

## 2018-05-31 ENCOUNTER — Encounter: Payer: Self-pay | Admitting: Nurse Practitioner

## 2018-05-31 VITALS — BP 110/76 | HR 73 | Temp 99.1°F | Ht 64.0 in | Wt 191.4 lb

## 2018-05-31 DIAGNOSIS — Z8249 Family history of ischemic heart disease and other diseases of the circulatory system: Secondary | ICD-10-CM | POA: Insufficient documentation

## 2018-05-31 DIAGNOSIS — Z91012 Allergy to eggs: Secondary | ICD-10-CM | POA: Insufficient documentation

## 2018-05-31 DIAGNOSIS — Z79899 Other long term (current) drug therapy: Secondary | ICD-10-CM | POA: Insufficient documentation

## 2018-05-31 DIAGNOSIS — Z7951 Long term (current) use of inhaled steroids: Secondary | ICD-10-CM | POA: Insufficient documentation

## 2018-05-31 DIAGNOSIS — I10 Essential (primary) hypertension: Secondary | ICD-10-CM | POA: Insufficient documentation

## 2018-05-31 DIAGNOSIS — Z91018 Allergy to other foods: Secondary | ICD-10-CM | POA: Insufficient documentation

## 2018-05-31 DIAGNOSIS — Z01419 Encounter for gynecological examination (general) (routine) without abnormal findings: Secondary | ICD-10-CM | POA: Insufficient documentation

## 2018-05-31 DIAGNOSIS — H6592 Unspecified nonsuppurative otitis media, left ear: Secondary | ICD-10-CM

## 2018-05-31 DIAGNOSIS — H748X2 Other specified disorders of left middle ear and mastoid: Secondary | ICD-10-CM | POA: Insufficient documentation

## 2018-05-31 DIAGNOSIS — Z833 Family history of diabetes mellitus: Secondary | ICD-10-CM | POA: Insufficient documentation

## 2018-05-31 DIAGNOSIS — M069 Rheumatoid arthritis, unspecified: Secondary | ICD-10-CM | POA: Insufficient documentation

## 2018-05-31 DIAGNOSIS — Z124 Encounter for screening for malignant neoplasm of cervix: Secondary | ICD-10-CM

## 2018-05-31 MED ORDER — FLUTICASONE PROPIONATE 50 MCG/ACT NA SUSP: 2.0000 | Freq: Every day | NASAL | 1 refills | Status: DC

## 2018-05-31 MED FILL — FLUTICASONE PROP 50 MCG SPR: 50 | 30 days supply | Qty: 16 | Fill #0

## 2018-05-31 NOTE — Progress Notes (Signed)
Assessment & Plan:  Karina Murphy was seen today for gynecologic exam.  Diagnoses and all orders for this visit:  Encounter for Papanicolaou smear for cervical cancer screening -     Cytology - PAP  Middle ear effusion, left -     fluticasone (FLONASE) 50 MCG/ACT nasal spray; Place 2 sprays into both nostrils daily.    Patient has been counseled on age-appropriate routine health concerns for screening and prevention. These are reviewed and up-to-date. Referrals have been placed accordingly. Immunizations are up-to-date or declined.    Subjective:   Chief Complaint  Patient presents with  . Gynecologic Exam    Pt. is here for a pap smear.    HPI Karina Murphy 37 y.o. female presents to office today   Review of Systems  Constitutional: Negative.  Negative for chills, fever, malaise/fatigue and weight loss.  HENT: Positive for ear pain (left).   Respiratory: Negative.  Negative for cough, shortness of breath and wheezing.   Cardiovascular: Negative.  Negative for chest pain, orthopnea and leg swelling.  Gastrointestinal: Negative for abdominal pain.  Genitourinary: Negative.  Negative for flank pain.  Skin: Negative.  Negative for rash.  Psychiatric/Behavioral: Negative for suicidal ideas.    Past Medical History:  Diagnosis Date  . Anxiety   . Arthritis, rheumatoid (HCC)   . Chronic headaches   . Hypertension   . Urticaria     Past Surgical History:  Procedure Laterality Date  . NO PAST SURGERIES      Family History  Problem Relation Age of Onset  . Diabetes Mother   . Hypertension Mother   . Stomach cancer Neg Hx   . Colon cancer Neg Hx     Social History Reviewed with no changes to be made today.   Outpatient Medications Prior to Visit  Medication Sig Dispense Refill  . albuterol (PROVENTIL HFA;VENTOLIN HFA) 108 (90 Base) MCG/ACT inhaler Inhale 2 puffs into the lungs every 6 (six) hours as needed for wheezing or shortness of breath. 1 Inhaler 2  .  cetirizine (ZYRTEC) 10 MG tablet Take 1 tablet (10 mg total) by mouth daily. 90 tablet 1  . ibuprofen (ADVIL,MOTRIN) 800 MG tablet Take 1 tablet (800 mg total) by mouth 3 (three) times daily. 21 tablet 0  . omeprazole (PRILOSEC) 20 MG capsule Take 1 capsule (20 mg total) by mouth daily. 90 capsule 1  . ondansetron (ZOFRAN-ODT) 4 MG disintegrating tablet Take 1 tablet (4 mg total) by mouth every 8 (eight) hours as needed for nausea or vomiting. 12 tablet 0  . fluticasone (FLONASE) 50 MCG/ACT nasal spray Place 1 spray into both nostrils daily. 16 g 1  . mupirocin cream (BACTROBAN) 2 % Apply 1 application topically 2 (two) times daily. (Patient not taking: Reported on 05/31/2018) 30 g 1  . mupirocin ointment (BACTROBAN) 2 % Apply 1 application topically 2 (two) times daily. (Patient not taking: Reported on 05/31/2018) 30 g 1   No facility-administered medications prior to visit.     Allergies  Allergen Reactions  . Eggs Or Egg-Derived Products Nausea And Vomiting    Nausea/vomitting only with whole eggs, no problem with eggs used in ingredients  . Pineapple        Objective:    BP 110/76 (BP Location: Left Arm, Patient Position: Sitting, Cuff Size: Large)   Pulse 73   Temp 99.1 F (37.3 C) (Oral)   Ht 5\' 4"  (1.626 m)   Wt 191 lb 6.4 oz (86.8 kg)  SpO2 97%   BMI 32.85 kg/m  Wt Readings from Last 3 Encounters:  05/31/18 191 lb 6.4 oz (86.8 kg)  05/03/18 191 lb 9.6 oz (86.9 kg)  11/25/17 178 lb (80.7 kg)    Physical Exam  Constitutional: She is oriented to person, place, and time. She appears well-developed and well-nourished.  HENT:  Head: Normocephalic.  Right Ear: Hearing, tympanic membrane, external ear and ear canal normal.  Left Ear: Hearing, external ear and ear canal normal. A middle ear effusion is present.  Cardiovascular: Normal rate, regular rhythm and normal heart sounds.  Pulmonary/Chest: Effort normal and breath sounds normal.  Abdominal: Soft. Bowel sounds are  normal. Hernia confirmed negative in the right inguinal area and confirmed negative in the left inguinal area.  Genitourinary: Rectum normal, vagina normal and uterus normal. Rectal exam shows no external hemorrhoid. No labial fusion. There is no rash, tenderness, lesion or injury on the right labia. There is no rash, tenderness, lesion or injury on the left labia. Uterus is not deviated and not enlarged. Cervix exhibits no motion tenderness and no friability. Right adnexum displays no mass, no tenderness and no fullness. Left adnexum displays no mass, no tenderness and no fullness. No erythema, tenderness or bleeding in the vagina. No foreign body in the vagina. No signs of injury around the vagina. No vaginal discharge found.  Lymphadenopathy: No inguinal adenopathy noted on the right or left side.       Right: No inguinal adenopathy present.       Left: No inguinal adenopathy present.  Neurological: She is alert and oriented to person, place, and time.  Skin: Skin is warm and dry.  Psychiatric: She has a normal mood and affect. Her behavior is normal. Judgment and thought content normal.       Patient has been counseled extensively about nutrition and exercise as well as the importance of adherence with medications and regular follow-up. The patient was given clear instructions to go to ER or return to medical center if symptoms don't improve, worsen or new problems develop. The patient verbalized understanding.   Follow-up: No follow-ups on file.   Claiborne Rigg, FNP-BC [facility] [place], [place] [phone]   05/31/2018, 4:05 PM

## 2018-06-02 LAB — CYTOLOGY - PAP
Bacterial vaginitis: NEGATIVE
Candida vaginitis: NEGATIVE
Chlamydia: NEGATIVE
Diagnosis: NEGATIVE
Neisseria Gonorrhea: NEGATIVE
Trichomonas: NEGATIVE

## 2018-06-05 ENCOUNTER — Telehealth: Payer: Self-pay

## 2018-06-05 NOTE — Telephone Encounter (Signed)
CMA attempt to reach patient to inform on results.  No answer and left VM. If patient call back, please inform:   PAP smear is normal.

## 2018-06-05 NOTE — Telephone Encounter (Signed)
-----   Message from Claiborne Rigg, NP sent at 06/03/2018 10:59 AM EDT ----- PAP smear is normal.

## 2018-06-29 ENCOUNTER — Other Ambulatory Visit: Payer: Self-pay

## 2018-06-29 ENCOUNTER — Encounter (HOSPITAL_COMMUNITY): Payer: Self-pay | Admitting: Emergency Medicine

## 2018-06-29 ENCOUNTER — Ambulatory Visit (HOSPITAL_COMMUNITY)
Admission: EM | Admit: 2018-06-29 | Discharge: 2018-06-29 | Disposition: A | Discharge status: Home / Self Care | Payer: Self-pay | Attending: Family Medicine | Admitting: Family Medicine

## 2018-06-29 DIAGNOSIS — R519 Headache, unspecified: Secondary | ICD-10-CM

## 2018-06-29 DIAGNOSIS — R51 Headache: Secondary | ICD-10-CM

## 2018-06-29 DIAGNOSIS — H6592 Unspecified nonsuppurative otitis media, left ear: Secondary | ICD-10-CM

## 2018-06-29 DIAGNOSIS — H9203 Otalgia, bilateral: Secondary | ICD-10-CM

## 2018-06-29 MED ORDER — FLUTICASONE PROPIONATE 50 MCG/ACT NA SUSP: 2.0000 | Freq: Every day | NASAL | 0 refills | Status: DC

## 2018-06-29 MED ORDER — KETOROLAC TROMETHAMINE 60 MG/2ML IM SOLN
INTRAMUSCULAR | Status: AC
  Filled 2018-06-29: qty 2

## 2018-06-29 MED ORDER — DIPHENHYDRAMINE HCL 25 MG PO TABS: 25.0000 mg | ORAL_TABLET | Freq: Four times a day (QID) | ORAL | 0 refills | Status: DC

## 2018-06-29 MED ORDER — PREDNISONE 50 MG PO TABS: 50.0000 mg | ORAL_TABLET | Freq: Every day | ORAL | 0 refills | Status: AC

## 2018-06-29 MED ORDER — KETOROLAC TROMETHAMINE 60 MG/2ML IM SOLN
60.0000 mg | Freq: Once | INTRAMUSCULAR | Status: AC
  Administered 2018-06-29: 60 mg via INTRAMUSCULAR

## 2018-06-29 NOTE — ED Provider Notes (Signed)
[facility]    CSN: [id number] Arrival date & time: 06/29/18  1623     History   Chief Complaint Chief Complaint  Patient presents with  . Otalgia    HPI Karina Murphy is a 37 y.o. female.   Karina Murphy presents with family with complaints of headache and bilateral ear pain and pressure which started approximately 4 days ago. Has had migraines in the past but this isn't as severe as light sensitivity hasn't been present. Pressure sensation, 10/10. Ears feel pressure. Has had nausea and vomiting as well. No abdominal pain. No diarrhea. Eating and drinking. Took ibuprofen last this morning which minimally helped. No fevers. No known ill contacts. No cough or congestion. Hx of anxiety, headaches, RA.   Family provides as needed spanish interpreation/clarification.   ROS per HPI.      Past Medical History:  Diagnosis Date  . Anxiety   . Arthritis, rheumatoid (HCC)   . Chronic headaches   . Hypertension   . Urticaria     Patient Active Problem List   Diagnosis Date Noted  . Weakness 11/23/2016  . Breast pain, left 09/17/2016  . Abdominal pain, epigastric 07/27/2016  . Rheumatoid arthritis (HCC) 02/12/2014  . Obese 12/26/2013    Past Surgical History:  Procedure Laterality Date  . NO PAST SURGERIES      OB History   None      Home Medications    Prior to Admission medications   Medication Sig Start Date End Date Taking? Authorizing Provider  albuterol (PROVENTIL HFA;VENTOLIN HFA) 108 (90 Base) MCG/ACT inhaler Inhale 2 puffs into the lungs every 6 (six) hours as needed for wheezing or shortness of breath. 05/03/18   Claiborne Rigg, NP  diphenhydrAMINE (BENADRYL) 25 MG tablet Take 1 tablet (25 mg total) by mouth every 6 (six) hours. 06/29/18   Georgetta Haber, NP  fluticasone (FLONASE) 50 MCG/ACT nasal spray Place 2 sprays into both nostrils daily. 06/29/18 09/27/18  Georgetta Haber, NP  ibuprofen (ADVIL,MOTRIN) 800 MG tablet Take 1 tablet  (800 mg total) by mouth 3 (three) times daily. 02/23/18   Georgetta Haber, NP  predniSONE (DELTASONE) 50 MG tablet Take 1 tablet (50 mg total) by mouth daily with breakfast for 3 days. 06/29/18 07/02/18  Georgetta Haber, NP    Family History Family History  Problem Relation Age of Onset  . Diabetes Mother   . Hypertension Mother   . Stomach cancer Neg Hx   . Colon cancer Neg Hx     Social History Social History   Tobacco Use  . Smoking status: Never Smoker  . Smokeless tobacco: Never Used  Substance Use Topics  . Alcohol use: No  . Drug use: No     Allergies   Eggs or egg-derived products and Pineapple   Review of Systems Review of Systems   Physical Exam Triage Vital Signs ED Triage Vitals  Enc Vitals Group     BP 06/29/18 1704 126/67     Pulse Rate 06/29/18 1704 62     Resp 06/29/18 1704 18     Temp 06/29/18 1704 98.5 F (36.9 C)     Temp Source 06/29/18 1704 Oral     SpO2 06/29/18 1704 100 %     Weight --      Height --      Head Circumference --      Peak Flow --      Pain Score 06/29/18 1701 10  Pain Loc --      Pain Edu? --      Excl. in GC? --    No data found.  Updated Vital Signs BP 126/67 (BP Location: Right Arm)   Pulse 62   Temp 98.5 F (36.9 C) (Oral)   Resp 18   SpO2 100%    Physical Exam  Constitutional: She is oriented to person, place, and time. She appears well-developed and well-nourished. No distress.  HENT:  Head: Normocephalic and atraumatic.  Right Ear: External ear and ear canal normal. A middle ear effusion is present.  Left Ear: External ear and ear canal normal. A middle ear effusion is present.  Nose: Nose normal.  Mouth/Throat: Uvula is midline, oropharynx is clear and moist and mucous membranes are normal. No tonsillar exudate.  Right TM effusion > L  Eyes: Pupils are equal, round, and reactive to light. Conjunctivae and EOM are normal.  Cardiovascular: Normal rate, regular rhythm and normal heart sounds.    Pulmonary/Chest: Effort normal and breath sounds normal.  Abdominal: Soft. There is no tenderness.  Neurological: She is alert and oriented to person, place, and time. No cranial nerve deficit or sensory deficit. She exhibits normal muscle tone.  Skin: Skin is warm and dry.     UC Treatments / Results  Labs (all labs ordered are listed, but only abnormal results are displayed) Labs Reviewed - No data to display  EKG None  Radiology No results found.  Procedures Procedures (including critical care time)  Medications Ordered in UC Medications  ketorolac (TORADOL) injection 60 mg (has no administration in time range)    Initial Impression / Assessment and Plan / UC Course  I have reviewed the triage vital signs and the nursing notes.  Pertinent labs & imaging results that were available during my care of the patient were reviewed by me and considered in my medical decision making (see chart for details).     Non toxic. No red flag findings on exam. Ear effusions present. Three days of prednisone as well as daily flonase. toradol provided in clinic today, encouraged benadryl tonight as well. Return precautions provided. If symptoms worsen or do not improve in the next week to return to be seen or to follow up with PCP. Marland Kitchen Patient verbalized understanding and agreeable to plan.   Final Clinical Impressions(s) / UC Diagnoses   Final diagnoses:  Acute nonintractable headache, unspecified headache type  Otalgia of both ears     Discharge Instructions     Push fluids to ensure adequate hydration and keep secretions thin.  Tylenol and/or ibuprofen as needed for pain or fevers. May take additional ibuprofen in another 8 hours after injection given tonight.  3 days of prednisone. Daily flonase to hep with ear pain.  Please take benadryl tonight to help with sleep and with symptoms. May take as needed during the day but may cause drowsiness. If symptoms worsen or do not improve  in the next week to return to be seen or to follow up with your PCP.     ED Prescriptions    Medication Sig Dispense Auth. Provider   fluticasone (FLONASE) 50 MCG/ACT nasal spray Place 2 sprays into both nostrils daily. 16 g Linus Mako B, NP   diphenhydrAMINE (BENADRYL) 25 MG tablet Take 1 tablet (25 mg total) by mouth every 6 (six) hours. 20 tablet Linus Mako B, NP   predniSONE (DELTASONE) 50 MG tablet Take 1 tablet (50 mg total) by mouth daily with  breakfast for 3 days. 3 tablet Georgetta Haber, NP     Controlled Substance Prescriptions [place] Controlled Substance Registry consulted? Not Applicable   Georgetta Haber, NP 06/29/18 1807

## 2018-06-29 NOTE — Discharge Instructions (Signed)
Push fluids to ensure adequate hydration and keep secretions thin.  Tylenol and/or ibuprofen as needed for pain or fevers. May take additional ibuprofen in another 8 hours after injection given tonight.  3 days of prednisone. Daily flonase to hep with ear pain.  Please take benadryl tonight to help with sleep and with symptoms. May take as needed during the day but may cause drowsiness. If symptoms worsen or do not improve in the next week to return to be seen or to follow up with your PCP.

## 2018-06-29 NOTE — ED Triage Notes (Signed)
Headache and bilateral ear pain.  Headaches started on Sunday.  On Monday, both ears started hurting

## 2018-07-28 ENCOUNTER — Ambulatory Visit: Payer: Self-pay | Attending: Family Medicine | Admitting: Physician Assistant

## 2018-07-28 ENCOUNTER — Other Ambulatory Visit: Payer: Self-pay

## 2018-07-28 VITALS — Temp 98.2°F | Resp 16 | Wt 186.0 lb

## 2018-07-28 DIAGNOSIS — J45909 Unspecified asthma, uncomplicated: Secondary | ICD-10-CM | POA: Insufficient documentation

## 2018-07-28 DIAGNOSIS — M069 Rheumatoid arthritis, unspecified: Secondary | ICD-10-CM | POA: Insufficient documentation

## 2018-07-28 DIAGNOSIS — Z79899 Other long term (current) drug therapy: Secondary | ICD-10-CM | POA: Insufficient documentation

## 2018-07-28 DIAGNOSIS — Z792 Long term (current) use of antibiotics: Secondary | ICD-10-CM | POA: Insufficient documentation

## 2018-07-28 DIAGNOSIS — Z791 Long term (current) use of non-steroidal anti-inflammatories (NSAID): Secondary | ICD-10-CM | POA: Insufficient documentation

## 2018-07-28 DIAGNOSIS — R42 Dizziness and giddiness: Secondary | ICD-10-CM | POA: Insufficient documentation

## 2018-07-28 DIAGNOSIS — H6692 Otitis media, unspecified, left ear: Secondary | ICD-10-CM | POA: Insufficient documentation

## 2018-07-28 DIAGNOSIS — R519 Headache, unspecified: Secondary | ICD-10-CM

## 2018-07-28 DIAGNOSIS — H6690 Otitis media, unspecified, unspecified ear: Secondary | ICD-10-CM

## 2018-07-28 DIAGNOSIS — R51 Headache: Secondary | ICD-10-CM

## 2018-07-28 MED ORDER — AMOXICILLIN-POT CLAVULANATE 500-125 MG PO TABS: 1.0000 | ORAL_TABLET | Freq: Two times a day (BID) | ORAL | 0 refills | Status: AC

## 2018-07-28 MED ORDER — CETIRIZINE HCL 10 MG PO TABS: 10.0000 mg | ORAL_TABLET | Freq: Every day | ORAL | 11 refills | Status: DC

## 2018-07-28 MED FILL — ?CETIRIZINE HCL 10 MG TABLE: 10 | 30 days supply | Qty: 30 | Fill #0

## 2018-07-28 MED FILL — AMOX-CLAV 500-125 MG TABLET: 500-125 | 10 days supply | Qty: 20 | Fill #0

## 2018-07-28 NOTE — Progress Notes (Signed)
Chief Complaint: Ear pain and dizziness  Subjective: This is a 37 year old female with a history of rheumatoid arthritis, asthma, reflux and migraines who was at urgent care on 06/29/2018 with headaches, bilateral ear pain/pressure and nausea and vomiting.  She had been taking ibuprofen with temporary relief.  She was treated there with Toradol.  She was afebrile and had a normal blood pressure.  She was sent home with continue Tylenol as needed, steroids, Flonase and Benadryl as needed.  Initially she started feeling a little bit better but now she is having worsening ear pain especially on the left side.  Sometimes she feels like when she touches her ear is wet.  She has associated dizziness and lightheadedness as well.  Slight headache.  Slight decrease in appetite.  Slight cough.   ROS:  GEN: denies fever or chills, denies change in weight Skin: denies lesions or rashes HEENT:+ dizziness + headache, + earache, epistaxis, sore throat, or neck pain LUNGS: denies SHOB, dyspnea, PND, orthopnea +cough CV: denies CP or palpitations ABD: denies abd pain, N or V   Objective:  Vitals:   07/28/18 1127  Resp: 16  Temp: 98.2 F (36.8 C)  SpO2: 100%  Weight: 186 lb (84.4 kg)    Physical Exam:  General: in no acute distress. HEENT: no pallor, no icterus, moist oral mucosa, no exudate, injected ears with some fluid on left Heart: Normal  s1 &s2  Regular rate and rhythm, without murmurs, rubs, gallops. Lungs: Clear to auscultation bilaterally.   Medications: Prior to Admission medications   Medication Sig Start Date End Date Taking? Authorizing Provider  fluticasone (FLONASE) 50 MCG/ACT nasal spray Place 2 sprays into both nostrils daily. 06/29/18 09/27/18 Yes Burky, Barron Alvine, NP  ibuprofen (ADVIL,MOTRIN) 800 MG tablet Take 1 tablet (800 mg total) by mouth 3 (three) times daily. 02/23/18  Yes Burky, Barron Alvine, NP  albuterol (PROVENTIL HFA;VENTOLIN HFA) 108 (90 Base) MCG/ACT inhaler  Inhale 2 puffs into the lungs every 6 (six) hours as needed for wheezing or shortness of breath. Patient not taking: Reported on 07/28/2018 05/03/18   Claiborne Rigg, NP  amoxicillin-clavulanate (AUGMENTIN) 500-125 MG tablet Take 1 tablet (500 mg total) by mouth 2 (two) times daily for 10 days. 07/28/18 08/07/18  Vivianne Master, PA-C  cetirizine (ZYRTEC) 10 MG tablet Take 1 tablet (10 mg total) by mouth daily. 07/28/18   Vivianne Master, PA-C  diphenhydrAMINE (BENADRYL) 25 MG tablet Take 1 tablet (25 mg total) by mouth every 6 (six) hours. Patient not taking: Reported on 07/28/2018 06/29/18   Georgetta Haber, NP    Assessment: 1. Acute OM on left w/ some fluid 2. Dizziness   Plan: Augmentin Zyrtec Cont supportive measures OTC  Follow up:as scheduled  The patient was given clear instructions to go to ER or return to medical center if symptoms don't improve, worsen or new problems develop. The patient verbalized understanding. The patient was told to call to get lab results if they haven't heard anything in the next week.   This note has been created with Education officer, environmental. Any transcriptional errors are unintentional.   Scot Jun, PA-C 07/28/2018, 12:38 PM

## 2018-07-28 NOTE — Patient Instructions (Signed)
Keep using Tylenol or Ibuprofen for pain Keep using the nasal spray I have added an allergy pill and antibiotic

## 2018-07-28 NOTE — Progress Notes (Signed)
Pt here today for left ear pain and dizziness Ear pain x 3 weeks Sounds like fluid is in the ear

## 2018-08-24 ENCOUNTER — Ambulatory Visit (HOSPITAL_COMMUNITY)
Admission: EM | Admit: 2018-08-24 | Discharge: 2018-08-24 | Disposition: A | Discharge status: Home / Self Care | Payer: Self-pay | Attending: Family Medicine | Admitting: Family Medicine

## 2018-08-24 ENCOUNTER — Encounter (HOSPITAL_COMMUNITY): Payer: Self-pay | Admitting: Emergency Medicine

## 2018-08-24 ENCOUNTER — Other Ambulatory Visit: Payer: Self-pay

## 2018-08-24 DIAGNOSIS — J020 Streptococcal pharyngitis: Secondary | ICD-10-CM | POA: Insufficient documentation

## 2018-08-24 LAB — POCT RAPID STREP A: Streptococcus, Group A Screen (Direct): POSITIVE — AB

## 2018-08-24 MED ORDER — HYDROCODONE-HOMATROPINE 5-1.5 MG/5ML PO SYRP: 5.0000 mL | ORAL_SOLUTION | Freq: Four times a day (QID) | ORAL | 0 refills | Status: DC | PRN

## 2018-08-24 MED ORDER — AMOXICILLIN 875 MG PO TABS: 875.0000 mg | ORAL_TABLET | Freq: Two times a day (BID) | ORAL | 0 refills | Status: DC

## 2018-08-24 NOTE — ED Provider Notes (Signed)
[facility]    CSN: [id number] Arrival date & time: 08/24/18  1642     History   Chief Complaint Chief Complaint  Patient presents with  . Fever  . Cough    HPI Karina Murphy is a 37 y.o. female.   PT reports cough, fever, sore throat, and a "lump" in neck since Saturday.  The throat is particularly sore but she also has ear pain and cough.  She has no allergies.  There is been no rash     Past Medical History:  Diagnosis Date  . Anxiety   . Arthritis, rheumatoid (HCC)   . Chronic headaches   . Hypertension   . Urticaria     Patient Active Problem List   Diagnosis Date Noted  . Weakness 11/23/2016  . Breast pain, left 09/17/2016  . Abdominal pain, epigastric 07/27/2016  . Rheumatoid arthritis (HCC) 02/12/2014  . Obese 12/26/2013    Past Surgical History:  Procedure Laterality Date  . NO PAST SURGERIES      OB History   No obstetric history on file.      Home Medications    Prior to Admission medications   Medication Sig Start Date End Date Taking? Authorizing Provider  cetirizine (ZYRTEC) 10 MG tablet Take 1 tablet (10 mg total) by mouth daily. 07/28/18  Yes Danelle Earthly, Tiffany S, PA-C  ibuprofen (ADVIL,MOTRIN) 800 MG tablet Take 1 tablet (800 mg total) by mouth 3 (three) times daily. 02/23/18  Yes Burky, Barron Alvine, NP  albuterol (PROVENTIL HFA;VENTOLIN HFA) 108 (90 Base) MCG/ACT inhaler Inhale 2 puffs into the lungs every 6 (six) hours as needed for wheezing or shortness of breath. Patient not taking: Reported on 07/28/2018 05/03/18   Claiborne Rigg, NP  amoxicillin (AMOXIL) 875 MG tablet Take 1 tablet (875 mg total) by mouth 2 (two) times daily. 08/24/18   Elvina Sidle, MD  diphenhydrAMINE (BENADRYL) 25 MG tablet Take 1 tablet (25 mg total) by mouth every 6 (six) hours. Patient not taking: Reported on 07/28/2018 06/29/18   Georgetta Haber, NP  fluticasone (FLONASE) 50 MCG/ACT nasal spray Place 2 sprays into both nostrils daily.  06/29/18 09/27/18  Georgetta Haber, NP  HYDROcodone-homatropine (HYDROMET) 5-1.5 MG/5ML syrup Take 5 mLs by mouth every 6 (six) hours as needed for cough. 08/24/18   Elvina Sidle, MD    Family History Family History  Problem Relation Age of Onset  . Diabetes Mother   . Hypertension Mother   . Stomach cancer Neg Hx   . Colon cancer Neg Hx     Social History Social History   Tobacco Use  . Smoking status: Never Smoker  . Smokeless tobacco: Never Used  Substance Use Topics  . Alcohol use: No  . Drug use: No     Allergies   Eggs or egg-derived products and Pineapple   Review of Systems Review of Systems   Physical Exam Triage Vital Signs ED Triage Vitals  Enc Vitals Group     BP 08/24/18 1735 111/78     Pulse Rate 08/24/18 1735 98     Resp 08/24/18 1735 16     Temp 08/24/18 1735 99.5 F (37.5 C)     Temp Source 08/24/18 1735 Oral     SpO2 08/24/18 1735 100 %     Weight --      Height --      Head Circumference --      Peak Flow --      Pain  Score 08/24/18 1736 10     Pain Loc --      Pain Edu? --      Excl. in GC? --    No data found.  Updated Vital Signs BP 111/78   Pulse 98   Temp 99.5 F (37.5 C) (Oral)   Resp 16   LMP 08/15/2018   SpO2 100%    Physical Exam Vitals signs and nursing note reviewed.  Constitutional:      Appearance: Normal appearance.  HENT:     Head: Normocephalic and atraumatic.     Right Ear: Tympanic membrane normal.     Left Ear: Tympanic membrane normal.     Nose: Nose normal.     Mouth/Throat:     Comments: Exudates and swelling over the tonsils. Eyes:     Conjunctiva/sclera: Conjunctivae normal.     Pupils: Pupils are equal, round, and reactive to light.  Cardiovascular:     Rate and Rhythm: Normal rate.     Heart sounds: Normal heart sounds.  Pulmonary:     Effort: Pulmonary effort is normal.     Breath sounds: Normal breath sounds.  Musculoskeletal: Normal range of motion.  Skin:    General: Skin is  warm and dry.  Neurological:     Mental Status: She is alert.      UC Treatments / Results  Labs (all labs ordered are listed, but only abnormal results are displayed) Labs Reviewed  POCT RAPID STREP A - Abnormal; Notable for the following components:      Result Value   Streptococcus, Group A Screen (Direct) POSITIVE (*)    All other components within normal limits    EKG None  Radiology No results found.  Procedures Procedures (including critical care time)  Medications Ordered in UC Medications - No data to display  Initial Impression / Assessment and Plan / UC Course  I have reviewed the triage vital signs and the nursing notes.  Pertinent labs & imaging results that were available during my care of the patient were reviewed by me and considered in my medical decision making (see chart for details).    Final Clinical Impressions(s) / UC Diagnoses   Final diagnoses:  Streptococcal sore throat   Discharge Instructions   None    ED Prescriptions    Medication Sig Dispense Auth. Provider   amoxicillin (AMOXIL) 875 MG tablet Take 1 tablet (875 mg total) by mouth 2 (two) times daily. 20 tablet Elvina Sidle, MD   HYDROcodone-homatropine (HYDROMET) 5-1.5 MG/5ML syrup Take 5 mLs by mouth every 6 (six) hours as needed for cough. 60 mL Elvina Sidle, MD     Controlled Substance Prescriptions [place] Controlled Substance Registry consulted? Not Applicable   Elvina Sidle, MD 08/24/18 Harrietta Guardian

## 2018-08-24 NOTE — ED Triage Notes (Signed)
PT reports cough, fever, sore throat, and a "lump" in neck since Saturday.

## 2018-10-09 ENCOUNTER — Ambulatory Visit: Payer: Self-pay

## 2018-10-10 ENCOUNTER — Ambulatory Visit: Payer: Self-pay | Attending: Nurse Practitioner | Admitting: Nurse Practitioner

## 2018-10-10 ENCOUNTER — Other Ambulatory Visit: Payer: Self-pay

## 2018-10-10 ENCOUNTER — Encounter: Payer: Self-pay | Admitting: Nurse Practitioner

## 2018-10-10 VITALS — BP 96/66 | HR 81 | Temp 98.9°F | Resp 18 | Ht 64.0 in | Wt 188.0 lb

## 2018-10-10 DIAGNOSIS — R053 Chronic cough: Secondary | ICD-10-CM

## 2018-10-10 DIAGNOSIS — J4520 Mild intermittent asthma, uncomplicated: Secondary | ICD-10-CM

## 2018-10-10 DIAGNOSIS — R05 Cough: Secondary | ICD-10-CM

## 2018-10-10 DIAGNOSIS — J029 Acute pharyngitis, unspecified: Secondary | ICD-10-CM

## 2018-10-10 DIAGNOSIS — K219 Gastro-esophageal reflux disease without esophagitis: Secondary | ICD-10-CM

## 2018-10-10 LAB — POCT RAPID STREP A (OFFICE): RAPID STREP A SCREEN: NEGATIVE

## 2018-10-10 MED ORDER — ALBUTEROL SULFATE HFA 108 (90 BASE) MCG/ACT IN AERS: 2.0000 | INHALATION_SPRAY | Freq: Four times a day (QID) | RESPIRATORY_TRACT | 2 refills | Status: DC | PRN

## 2018-10-10 MED ORDER — BENZONATATE 200 MG PO CAPS: 200.0000 mg | ORAL_CAPSULE | Freq: Three times a day (TID) | ORAL | 0 refills | Status: DC | PRN

## 2018-10-10 MED ORDER — HYDROCODONE-HOMATROPINE 5-1.5 MG/5ML PO SYRP: 5.0000 mL | ORAL_SOLUTION | Freq: Four times a day (QID) | ORAL | 0 refills | Status: DC | PRN

## 2018-10-10 MED ORDER — BENZONATATE 100 MG PO CAPS: 200.0000 mg | ORAL_CAPSULE | Freq: Three times a day (TID) | ORAL | 0 refills | Status: DC | PRN

## 2018-10-10 MED ORDER — OMEPRAZOLE 20 MG PO CPDR: 20.0000 mg | DELAYED_RELEASE_CAPSULE | Freq: Every day | ORAL | 3 refills | Status: DC

## 2018-10-10 MED FILL — BENZONATATE 100 MG CAP: 100 | 14 days supply | Qty: 84 | Fill #0

## 2018-10-10 MED FILL — $VENTOLIN HFA 18G INHALER: 108 (90 BAS | 75 days supply | Qty: 54 | Fill #0

## 2018-10-10 MED FILL — ?OMEPRAZOLE 20 MG CAPSULE D: 20 | 30 days supply | Qty: 30 | Fill #0

## 2018-10-10 NOTE — Patient Instructions (Signed)
Make sure you take the following medications every day and use your inhaler at least once daily.  Zyrtec 10mg   Omeprazole 20mg 

## 2018-10-10 NOTE — Progress Notes (Signed)
Assessment & Plan:  Karina Murphy was seen today for cough.  Diagnoses and all orders for this visit:  Persistent dry cough -     albuterol (PROVENTIL HFA;VENTOLIN HFA) 108 (90 Base) MCG/ACT inhaler; Inhale 2 puffs into the lungs every 6 (six) hours as needed for wheezing or shortness of breath. -     Discontinue: benzonatate (TESSALON) 200 MG capsule; Take 1 capsule (200 mg total) by mouth 3 (three) times daily as needed for up to 14 days for cough. -     HYDROcodone-homatropine (HYDROMET) 5-1.5 MG/5ML syrup; Take 5 mLs by mouth every 6 (six) hours as needed for cough. INSTRUCTIONS: use a humidifier for nasal congestion Drink plenty of fluids, rest and wash hands frequently to avoid the spread of infection Alternate tylenol and Motrin for relief of fever and pain  Sore throat -     Rapid Strep A  Gastroesophageal reflux disease without esophagitis -     omeprazole (PRILOSEC) 20 MG capsule; Take 1 capsule (20 mg total) by mouth daily. INSTRUCTIONS: Avoid GERD Triggers: acidic, spicy or fried foods, caffeine, coffee, sodas,  alcohol and chocolate.   Mild intermittent asthma without complication -     albuterol (PROVENTIL HFA;VENTOLIN HFA) 108 (90 Base) MCG/ACT inhaler; Inhale 2 puffs into the lungs every 6 (six) hours as needed for wheezing or shortness of breath.    Patient has been counseled on age-appropriate routine health concerns for screening and prevention. These are reviewed and up-to-date. Referrals have been placed accordingly. Immunizations are up-to-date or declined.    Subjective:   Chief Complaint  Patient presents with  . Cough   HPI Karina Murphy 38 y.o. female presents to office today with complaints of cough. She was treated in the ED for Strep throat and cough on 08-24-2018 with amoxicillin and given hydromet for cough at that time. Today she states the cough medication provides some relief however the cough continues.  Cough: Patient complains of  nonproductive cough and sore throat.  Symptoms began 1 month ago.  The cough is non-productive, without wheezing, dyspnea or hemoptysis, chest is painful during coughing and is aggravated by nothing Associated symptoms include:none. Patient does not have new pets. Patient does have a history of asthma and has not used her inhaler. Patient does have a history of environmental allergens and has not been taking her zyrtec. Patient has a history of GERD and has not been taking her omeprazole.  Patient does not have a history of smoking. Patient  does not have previous Chest X-ray. Patient has had a PPD done. Patient has not had recent travel.   Sore Throat: Patient complains of sore throat. Symptoms began 1 month ago. Pain is of mild and moderate severity. Fever is absent. Other associated symptoms have included cough.  Fluid intake is good.  There has not been contact with an individual with known strep however she was diagnosed and treated for strep last month. She endorses completion of antibiotics.   Current medications include none.     Review of Systems  Constitutional: Negative for fever, malaise/fatigue and weight loss.  HENT: Positive for sore throat. Negative for nosebleeds.   Eyes: Negative.  Negative for blurred vision, double vision and photophobia.  Respiratory: Positive for cough. Negative for shortness of breath.   Cardiovascular: Negative.  Negative for chest pain, palpitations and leg swelling.  Gastrointestinal: Negative.  Negative for heartburn, nausea and vomiting.  Musculoskeletal: Negative.  Negative for myalgias.  Neurological: Negative.  Negative for dizziness,  focal weakness, seizures and headaches.  Psychiatric/Behavioral: Negative.  Negative for suicidal ideas.    Past Medical History:  Diagnosis Date  . Anxiety   . Arthritis, rheumatoid (HCC)   . Chronic headaches   . Hypertension   . Urticaria     Past Surgical History:  Procedure Laterality Date  . NO PAST  SURGERIES      Family History  Problem Relation Age of Onset  . Diabetes Mother   . Hypertension Mother   . Stomach cancer Neg Hx   . Colon cancer Neg Hx     Social History Reviewed with no changes to be made today.   Outpatient Medications Prior to Visit  Medication Sig Dispense Refill  . cetirizine (ZYRTEC) 10 MG tablet Take 1 tablet (10 mg total) by mouth daily. 30 tablet 11  . fluticasone (FLONASE) 50 MCG/ACT nasal spray Place 2 sprays into both nostrils daily. 16 g 0  . ibuprofen (ADVIL,MOTRIN) 800 MG tablet Take 1 tablet (800 mg total) by mouth 3 (three) times daily. 21 tablet 0  . amoxicillin (AMOXIL) 875 MG tablet Take 1 tablet (875 mg total) by mouth 2 (two) times daily. 20 tablet 0  . HYDROcodone-homatropine (HYDROMET) 5-1.5 MG/5ML syrup Take 5 mLs by mouth every 6 (six) hours as needed for cough. 60 mL 0   No facility-administered medications prior to visit.     Allergies  Allergen Reactions  . Eggs Or Egg-Derived Products Nausea And Vomiting    Nausea/vomitting only with whole eggs, no problem with eggs used in ingredients  . Pineapple        Objective:    BP 96/66 (BP Location: Left Arm, Patient Position: Sitting, Cuff Size: Large)   Pulse 81   Temp 98.9 F (37.2 C) (Oral)   Resp 18   Ht 5\' 4"  (1.626 m)   Wt 188 lb (85.3 kg)   LMP 10/07/2018   SpO2 100%   BMI 32.27 kg/m  Wt Readings from Last 3 Encounters:  10/10/18 188 lb (85.3 kg)  07/28/18 186 lb (84.4 kg)  05/31/18 191 lb 6.4 oz (86.8 kg)    Physical Exam Vitals signs and nursing note reviewed.  Constitutional:      Appearance: She is well-developed.  HENT:     Head: Normocephalic and atraumatic.  Neck:     Musculoskeletal: Normal range of motion.  Cardiovascular:     Rate and Rhythm: Normal rate and regular rhythm.     Heart sounds: Normal heart sounds. No murmur. No friction rub. No gallop.   Pulmonary:     Effort: Pulmonary effort is normal. No tachypnea or respiratory distress.      Breath sounds: Normal breath sounds and air entry. No decreased air movement. No decreased breath sounds, wheezing, rhonchi or rales.  Chest:     Chest wall: No tenderness.  Abdominal:     General: Bowel sounds are normal.     Palpations: Abdomen is soft.  Musculoskeletal: Normal range of motion.  Skin:    General: Skin is warm and dry.  Neurological:     Mental Status: She is alert and oriented to person, place, and time.     Coordination: Coordination normal.  Psychiatric:        Behavior: Behavior normal. Behavior is cooperative.        Thought Content: Thought content normal.        Judgment: Judgment normal.        Patient has been counseled extensively about nutrition  and exercise as well as the importance of adherence with medications and regular follow-up. The patient was given clear instructions to go to ER or return to medical center if symptoms don't improve, worsen or new problems develop. The patient verbalized understanding.   Follow-up: Return in about 2 weeks (around 10/24/2018) for cough.   Claiborne RiggZelda W Teague Goynes, FNP-BC [facility] [place], [place] [phone]   10/10/2018, 5:29 PM

## 2018-11-06 ENCOUNTER — Telehealth: Payer: Self-pay | Admitting: Nurse Practitioner

## 2018-11-06 NOTE — Telephone Encounter (Signed)
Patient called to check on the status of their cafa please follow up °

## 2018-11-06 NOTE — Telephone Encounter (Signed)
Pt was call to informd that her CAFA was approve, she will come to the office to pick it up

## 2018-11-09 ENCOUNTER — Ambulatory Visit (HOSPITAL_COMMUNITY)
Admission: RE | Admit: 2018-11-09 | Discharge: 2018-11-09 | Disposition: A | Discharge status: Home / Self Care | Payer: Self-pay | Source: Ambulatory Visit | Attending: Physician Assistant | Admitting: Physician Assistant

## 2018-11-09 ENCOUNTER — Ambulatory Visit: Payer: Self-pay | Attending: Nurse Practitioner | Admitting: Physician Assistant

## 2018-11-09 VITALS — BP 109/73 | HR 80 | Temp 98.9°F | Resp 16 | Wt 189.0 lb

## 2018-11-09 DIAGNOSIS — R053 Chronic cough: Secondary | ICD-10-CM

## 2018-11-09 DIAGNOSIS — R05 Cough: Secondary | ICD-10-CM | POA: Insufficient documentation

## 2018-11-09 DIAGNOSIS — J4520 Mild intermittent asthma, uncomplicated: Secondary | ICD-10-CM

## 2018-11-09 DIAGNOSIS — Z131 Encounter for screening for diabetes mellitus: Secondary | ICD-10-CM

## 2018-11-09 LAB — GLUCOSE, POCT (MANUAL RESULT ENTRY): POC Glucose: 106 mg/dl — AB (ref 70–99)

## 2018-11-09 MED ORDER — METHYLPREDNISOLONE SODIUM SUCC 125 MG IJ SOLR
125.0000 mg | Freq: Once | INTRAMUSCULAR | Status: AC
  Administered 2018-11-09: 125 mg via INTRAMUSCULAR

## 2018-11-09 MED ORDER — FLUTICASONE-SALMETEROL 250-50 MCG/DOSE IN AEPB: 1.0000 | INHALATION_SPRAY | Freq: Two times a day (BID) | RESPIRATORY_TRACT | 2 refills | Status: DC

## 2018-11-09 MED FILL — !ADVAIR 250/50 DISKUS: 250-50 | 30 days supply | Qty: 60 | Fill #0

## 2018-11-09 NOTE — Progress Notes (Signed)
Patient ID: Karina Murphy, female   DOB: 1981/09/09, 38 y.o.   MRN: [id number]   Karina Murphy, is a 38 y.o. female  SFK:[id number]  FVC:[id number]  DOB - 1980-12-23  Subjective:  Chief Complaint and HPI: Karina Murphy is a 38 y.o. female here today with persistent dry cough.  Worse at night.  She's been having this for about 3 months now.  See previous notes.  Inhaler helps the most but only for about 1 hour.  Previous treatment include, albuterol, tessalon perles, narcotic cough syrup, and prilosec.  No improvement.  No h/o travel or exposure to TB.    ROS:   Constitutional:  No f/c, No night sweats, No unexplained weight loss. EENT:  No vision changes, No blurry vision, No hearing changes. No mouth, throat, or ear problems.  Respiratory: + cough, + SOB Cardiac: No CP, no palpitations GI:  No abd pain, No N/V/D. GU: No Urinary s/sx Musculoskeletal: No joint pain Neuro: No headache, no dizziness, no motor weakness.  Skin: No rash Endocrine:  No polydipsia. No polyuria.  Psych: Denies SI/HI  No problems updated.  ALLERGIES: Allergies  Allergen Reactions  . Eggs Or Egg-Derived Products Nausea And Vomiting    Nausea/vomitting only with whole eggs, no problem with eggs used in ingredients  . Pineapple     PAST MEDICAL HISTORY: Past Medical History:  Diagnosis Date  . Anxiety   . Arthritis, rheumatoid (HCC)   . Chronic headaches   . Hypertension   . Urticaria     MEDICATIONS AT HOME: Prior to Admission medications   Medication Sig Start Date End Date Taking? Authorizing Provider  albuterol (PROVENTIL HFA;VENTOLIN HFA) 108 (90 Base) MCG/ACT inhaler Inhale 2 puffs into the lungs every 6 (six) hours as needed for wheezing or shortness of breath. 10/10/18  Yes Claiborne Rigg, NP  benzonatate (TESSALON) 100 MG capsule Take 2 capsules (200 mg total) by mouth 3 (three) times daily as needed for cough (for up to 14 days). Patient not taking: Reported on  11/09/2018 10/10/18   Claiborne Rigg, NP  cetirizine (ZYRTEC) 10 MG tablet Take 1 tablet (10 mg total) by mouth daily. Patient not taking: Reported on 11/09/2018 07/28/18   Vivianne Master, PA-C  fluticasone [facility]) 50 MCG/ACT nasal spray Place 2 sprays into both nostrils daily. 06/29/18 09/27/18  Georgetta Haber, NP  Fluticasone-Salmeterol (ADVAIR) 250-50 MCG/DOSE AEPB Inhale 1 puff into the lungs 2 (two) times daily. 11/09/18   Anders Simmonds, PA-C  HYDROcodone-homatropine (HYDROMET) 5-1.5 MG/5ML syrup Take 5 mLs by mouth every 6 (six) hours as needed for cough. Patient not taking: Reported on 11/09/2018 10/10/18   Claiborne Rigg, NP  ibuprofen (ADVIL,MOTRIN) 800 MG tablet Take 1 tablet (800 mg total) by mouth 3 (three) times daily. Patient not taking: Reported on 11/09/2018 02/23/18   Linus Mako B, NP  omeprazole (PRILOSEC) 20 MG capsule Take 1 capsule (20 mg total) by mouth daily. Patient not taking: Reported on 11/09/2018 10/10/18   Claiborne Rigg, NP     Objective:  EXAM:   Vitals:   11/09/18 0940  BP: 109/73  Pulse: 80  Resp: 16  Temp: 98.9 F (37.2 C)  TempSrc: Oral  SpO2: 100%  Weight: 189 lb (85.7 kg)    General appearance : A&OX3. NAD. Non-toxic-appearing HEENT: Atraumatic and Normocephalic.  PERRLA. EOM intact.  TM clear B. Mouth-MMM, post pharynx WNL w/o erythema, No PND. Neck: supple, no JVD. No cervical lymphadenopathy. No thyromegaly Chest/Lungs:  Breathing-non-labored, Good air entry bilaterally, breath sounds overall good without without rales or rhonchi.  there is mild wheezing throughout CVS: S1 S2 regular, no murmurs, gallops, rubs Extremities: Bilateral Lower Ext shows no edema, both legs are warm to touch with = pulse throughout Neurology:  CN II-XII grossly intact, Non focal.   Psych:  TP linear. J/I WNL. Normal speech. Appropriate eye contact and affect.  Skin:  No Rash  Data Review Lab Results  Component Value Date   HGBA1C 5.50 06/12/2015    HGBA1C 5.7 (H) 12/26/2013     Assessment & Plan   1. Persistent dry cough - DG Chest 2 View; Future TB placement next week Mon-Wed.  - methylPREDNISolone sodium succinate (SOLU-MEDROL) 125 mg/2 mL injection 125 mg  2. Mild intermittent asthma without complication - methylPREDNISolone sodium succinate (SOLU-MEDROL) 125 mg/2 mL injection 125 mg - Fluticasone-Salmeterol (ADVAIR) 250-50 MCG/DOSE AEPB; Inhale 1 puff into the lungs 2 (two) times daily.  Dispense: 60 each; Refill: 2  3. Screening for diabetes mellitus R/O DM for solu-medrol - Glucose (CBG)   Patient have been counseled extensively about nutrition and exercise  Return for 3/30 appt with Zelda.  The patient was given clear instructions to go to ER or return to medical center if symptoms don't improve, worsen or new problems develop. The patient verbalized understanding. The patient was told to call to get lab results if they haven't heard anything in the next week.     Georgian Co, PA-C [facility] and [facility] [place], [place] [phone]   11/09/2018, 9:51 AM

## 2018-11-10 ENCOUNTER — Telehealth: Payer: Self-pay

## 2018-11-10 ENCOUNTER — Ambulatory Visit: Payer: Self-pay | Admitting: Nurse Practitioner

## 2018-11-10 NOTE — Telephone Encounter (Signed)
CMA attempt to reach patient to inform on results.  No answer and left a VM for patient to call back.  

## 2018-11-10 NOTE — Telephone Encounter (Signed)
-----   Message from Anders Simmonds, [place] sent at 11/10/2018  8:40 AM EST ----- Chest xray was normal.  Take meds and follow-up as planned.  Thanks, Georgian Co, PA-C

## 2018-11-14 ENCOUNTER — Ambulatory Visit: Payer: Self-pay

## 2018-11-15 NOTE — Telephone Encounter (Signed)
CMA spoke to patient to inform on results.  Patient verified DOB. Patient understood.  

## 2018-12-08 ENCOUNTER — Ambulatory Visit: Payer: Self-pay | Admitting: Nurse Practitioner

## 2018-12-11 ENCOUNTER — Encounter: Payer: Self-pay | Admitting: Nurse Practitioner

## 2018-12-11 ENCOUNTER — Other Ambulatory Visit: Payer: Self-pay

## 2018-12-11 ENCOUNTER — Ambulatory Visit: Payer: Self-pay | Attending: Nurse Practitioner | Admitting: Nurse Practitioner

## 2018-12-11 DIAGNOSIS — J4520 Mild intermittent asthma, uncomplicated: Secondary | ICD-10-CM

## 2018-12-11 DIAGNOSIS — R42 Dizziness and giddiness: Secondary | ICD-10-CM

## 2018-12-11 MED ORDER — CETIRIZINE HCL 10 MG PO TABS: 10.0000 mg | ORAL_TABLET | Freq: Every day | ORAL | 11 refills | Status: DC

## 2018-12-11 MED ORDER — FLUTICASONE PROPIONATE 50 MCG/ACT NA SUSP: 1.0000 | Freq: Every day | NASAL | 0 refills | Status: DC

## 2018-12-11 MED ORDER — FLUTICASONE-SALMETEROL 250-50 MCG/DOSE IN AEPB: 1.0000 | INHALATION_SPRAY | Freq: Two times a day (BID) | RESPIRATORY_TRACT | 2 refills | Status: DC

## 2018-12-11 NOTE — Progress Notes (Signed)
Assessment & Plan:  Karina Murphy was seen today for follow-up.  Diagnoses and all orders for this visit:  Mild intermittent asthma without complication -     cetirizine (ZYRTEC) 10 MG tablet; Take 1 tablet (10 mg total) by mouth daily. -     fluticasone (FLONASE) 50 MCG/ACT nasal spray; Place 1 spray into both nostrils daily. -     Fluticasone-Salmeterol (ADVAIR) 250-50 MCG/DOSE AEPB; Inhale 1 puff into the lungs 2 (two) times daily.  Dizziness -     CBC -     CMP14+EGFR -     Lipid panel -     VITAMIN D 25 Hydroxy (Vit-D Deficiency, Fractures) -     TSH    Patient has been counseled on age-appropriate routine health concerns for screening and prevention. These are reviewed and up-to-date. Referrals have been placed accordingly. Immunizations are up-to-date or declined.    Subjective:   Chief Complaint  Patient presents with  . Follow-up    Pt. stated her cough has been improved but it is still there.    HPI  She was treated for a persistent dry cough in January and February. She was prescribed an Advair inhaler in February but states   Asthma: Karina Murphy presents for evaluation of dry persistent cough. Patient's symptoms include dyspnea, non-productive cough and wheezing. Associated symptoms include dizziness. The patient has been suffering from these symptoms for approximately 2 months. Symptoms have been unchanged since their onset. Medications used in the past to treat these symptoms include beta agonist inhalers and combination beta agonists/steroid inhalers. Suspected precipitants include no identifiable factor. Patient has not required Emergency Room treatment for these symptoms, and has not required hospitalization. The patient has not been intubated in the past.  She has not been using her advair inhaler everyday or as prescribed.     Review of Systems  Constitutional: Negative for fever, malaise/fatigue and weight loss.  HENT: Negative.  Negative for  nosebleeds.   Eyes: Negative.  Negative for blurred vision, double vision and photophobia.  Respiratory: Positive for cough, shortness of breath and wheezing. Negative for sputum production.   Cardiovascular: Negative.  Negative for chest pain, palpitations and leg swelling.  Gastrointestinal: Negative.  Negative for heartburn, nausea and vomiting.  Musculoskeletal: Negative.  Negative for myalgias.  Neurological: Positive for dizziness. Negative for focal weakness, seizures and headaches.  Psychiatric/Behavioral: Negative.  Negative for suicidal ideas.    Past Medical History:  Diagnosis Date  . Anxiety   . Arthritis, rheumatoid ([place])   . Chronic headaches   . Hypertension   . Urticaria     Past Surgical History:  Procedure Laterality Date  . NO PAST SURGERIES      Family History  Problem Relation Age of Onset  . Diabetes Mother   . Hypertension Mother   . Stomach cancer Neg Hx   . Colon cancer Neg Hx     Social History Reviewed with no changes to be made today.   Outpatient Medications Prior to Visit  Medication Sig Dispense Refill  . albuterol (PROVENTIL HFA;VENTOLIN HFA) 108 (90 Base) MCG/ACT inhaler Inhale 2 puffs into the lungs every 6 (six) hours as needed for wheezing or shortness of breath. 1 Inhaler 2  . cetirizine (ZYRTEC) 10 MG tablet Take 1 tablet (10 mg total) by mouth daily. 30 tablet 11  . Fluticasone-Salmeterol (ADVAIR) 250-50 MCG/DOSE AEPB Inhale 1 puff into the lungs 2 (two) times daily. 60 each 2  . ibuprofen (ADVIL,MOTRIN) 800 MG  tablet Take 1 tablet (800 mg total) by mouth 3 (three) times daily. (Patient not taking: Reported on 11/09/2018) 21 tablet 0  . omeprazole (PRILOSEC) 20 MG capsule Take 1 capsule (20 mg total) by mouth daily. (Patient not taking: Reported on 11/09/2018) 30 capsule 3  . benzonatate (TESSALON) 100 MG capsule Take 2 capsules (200 mg total) by mouth 3 (three) times daily as needed for cough (for up to 14 days). (Patient not taking:  Reported on 11/09/2018) 84 capsule 0  . fluticasone (FLONASE) 50 MCG/ACT nasal spray Place 2 sprays into both nostrils daily. 16 g 0  . HYDROcodone-homatropine (HYDROMET) 5-1.5 MG/5ML syrup Take 5 mLs by mouth every 6 (six) hours as needed for cough. (Patient not taking: Reported on 11/09/2018) 60 mL 0   No facility-administered medications prior to visit.     Allergies  Allergen Reactions  . Eggs Or Egg-Derived Products Nausea And Vomiting    Nausea/vomitting only with whole eggs, no problem with eggs used in ingredients  . Pineapple        Objective:    LMP 12/06/2018  Wt Readings from Last 3 Encounters:  11/09/18 189 lb (85.7 kg)  10/10/18 188 lb (85.3 kg)  07/28/18 186 lb (84.4 kg)       Patient has been counseled extensively about nutrition and exercise as well as the importance of adherence with medications and regular follow-up. The patient was given clear instructions to go to ER or return to medical center if symptoms don't improve, worsen or new problems develop. The patient verbalized understanding.   Follow-up: Return in about 2 weeks (around 12/25/2018).   Gildardo Pounds, FNP-BC [facility] and [facility] [place], [place]   12/11/2018, 5:05 PM

## 2018-12-12 ENCOUNTER — Ambulatory Visit: Payer: Self-pay

## 2018-12-13 ENCOUNTER — Other Ambulatory Visit: Payer: Self-pay | Admitting: Nurse Practitioner

## 2018-12-13 LAB — CBC
Hematocrit: 38 % (ref 34.0–46.6)
Hemoglobin: 12.5 g/dL (ref 11.1–15.9)
MCH: 28.3 pg (ref 26.6–33.0)
MCHC: 32.9 g/dL (ref 31.5–35.7)
MCV: 86 fL (ref 79–97)
PLATELETS: 314 10*3/uL (ref 150–450)
RBC: 4.41 x10E6/uL (ref 3.77–5.28)
RDW: 14.2 % (ref 11.7–15.4)
WBC: 4.9 10*3/uL (ref 3.4–10.8)

## 2018-12-13 LAB — CMP14+EGFR
ALT: 11 IU/L (ref 0–32)
AST: 13 IU/L (ref 0–40)
Albumin/Globulin Ratio: 1.5 (ref 1.2–2.2)
Albumin: 4.4 g/dL (ref 3.8–4.8)
Alkaline Phosphatase: 64 IU/L (ref 39–117)
BUN/Creatinine Ratio: 14 (ref 9–23)
BUN: 9 mg/dL (ref 6–20)
Bilirubin Total: 0.8 mg/dL (ref 0.0–1.2)
CO2: 24 mmol/L (ref 20–29)
Calcium: 9.5 mg/dL (ref 8.7–10.2)
Chloride: 104 mmol/L (ref 96–106)
Creatinine, Ser: 0.66 mg/dL (ref 0.57–1.00)
GFR calc non Af Amer: 114 mL/min/{1.73_m2} (ref >59)
GFR, EST AFRICAN AMERICAN: 131 mL/min/{1.73_m2} (ref >59)
Globulin, Total: 3 g/dL (ref 1.5–4.5)
Glucose: 94 mg/dL (ref 65–99)
Potassium: 4.8 mmol/L (ref 3.5–5.2)
Sodium: 140 mmol/L (ref 134–144)
Total Protein: 7.4 g/dL (ref 6.0–8.5)

## 2018-12-13 LAB — TSH: TSH: 2.53 u[IU]/mL (ref 0.450–4.500)

## 2018-12-13 LAB — LIPID PANEL
Chol/HDL Ratio: 3.1 ratio (ref 0.0–4.4)
Cholesterol, Total: 154 mg/dL (ref 100–199)
HDL: 50 mg/dL (ref >39)
LDL Calculated: 90 mg/dL (ref 0–99)
Triglycerides: 68 mg/dL (ref 0–149)
VLDL CHOLESTEROL CAL: 14 mg/dL (ref 5–40)

## 2018-12-13 LAB — VITAMIN D 25 HYDROXY (VIT D DEFICIENCY, FRACTURES): VIT D 25 HYDROXY: 15.8 ng/mL — AB (ref 30.0–100.0)

## 2018-12-13 MED ORDER — VITAMIN D (ERGOCALCIFEROL) 1.25 MG (50000 UNIT) PO CAPS: 50000.0000 [IU] | ORAL_CAPSULE | ORAL | 1 refills | Status: DC

## 2018-12-18 ENCOUNTER — Telehealth: Payer: Self-pay

## 2018-12-18 NOTE — Telephone Encounter (Signed)
CMA spoke to patient to inform on lab results  And Rx.  Pt. Verified DOB. Pt. Understood.   [employer] Interpreter assist with the call.

## 2018-12-18 NOTE — Telephone Encounter (Signed)
-----   Message from Claiborne Rigg, NP sent at 12/13/2018 11:55 AM EDT ----- All of your labs are perfectly normal except for vitamin D. Your labs do not show anemia, your cholesterol levels are normal as well as kidney and liver function. I will send vitamin D prescription to the pharmacy for you take once a week for the nxt 3 months

## 2018-12-29 ENCOUNTER — Other Ambulatory Visit: Payer: Self-pay

## 2018-12-29 ENCOUNTER — Encounter: Payer: Self-pay | Admitting: Nurse Practitioner

## 2018-12-29 ENCOUNTER — Ambulatory Visit: Payer: Self-pay | Attending: Nurse Practitioner | Admitting: Nurse Practitioner

## 2018-12-29 DIAGNOSIS — F329 Major depressive disorder, single episode, unspecified: Secondary | ICD-10-CM

## 2018-12-29 DIAGNOSIS — F419 Anxiety disorder, unspecified: Secondary | ICD-10-CM

## 2018-12-29 DIAGNOSIS — J4520 Mild intermittent asthma, uncomplicated: Secondary | ICD-10-CM

## 2018-12-29 MED ORDER — CETIRIZINE HCL 10 MG PO TABS: 10.0000 mg | ORAL_TABLET | Freq: Every day | ORAL | 11 refills | Status: DC

## 2018-12-29 MED ORDER — HYDROXYZINE HCL 25 MG PO TABS: 25.0000 mg | ORAL_TABLET | Freq: Three times a day (TID) | ORAL | 0 refills | Status: DC | PRN

## 2018-12-29 MED ORDER — ESCITALOPRAM OXALATE 10 MG PO TABS: 10.0000 mg | ORAL_TABLET | Freq: Every day | ORAL | 1 refills | Status: DC

## 2018-12-29 MED ORDER — FLUTICASONE-SALMETEROL 250-50 MCG/DOSE IN AEPB: 1.0000 | INHALATION_SPRAY | Freq: Two times a day (BID) | RESPIRATORY_TRACT | 2 refills | Status: DC

## 2018-12-29 MED FILL — !ADVAIR 250/50 DISKUS: 250-50 | 30 days supply | Qty: 60 | Fill #0

## 2018-12-29 MED FILL — ?CETIRIZINE HCL 10 MG TABLE: 10 | 30 days supply | Qty: 30 | Fill #0

## 2018-12-29 MED FILL — ESCITALOPRAM 10 MG TABLET: 10 | 30 days supply | Qty: 30 | Fill #0

## 2018-12-29 MED FILL — hydrOXYzine HCL 25 MG TABS: 25 | 20 days supply | Qty: 60 | Fill #0

## 2018-12-29 NOTE — Progress Notes (Signed)
Virtual Visit via Telephone Note  I connected with Karina Murphy on 12/29/18 at  9:45 AM EDT by telephone and verified that I am speaking with the correct person using two identifiers. A  Spanish interpreter was used during today's call. [id number].    I discussed the limitations, risks, security and privacy concerns of performing an evaluation and management service by telephone and the availability of in person appointments. I also discussed with the patient that there may be a patient responsible charge related to this service. The patient expressed understanding and agreed to proceed.   History of Present Illness: She has concerns today of increased anxiety. She has a history of anxiety and depression and was previously taking Lexapro 20 mg and ativan. She would like to restart a medication for depression today. Endorses the following symptoms: feeling nervous, heart racing, dizziness, sense of panic and shortness of breath.  Depression screen [facility] 2/9 11/09/2018 07/28/2018 05/03/2018 09/02/2017 06/16/2017  Decreased Interest 1 0 1 1 1   Down, Depressed, Hopeless - 0 0 0 0  PHQ - 2 Score 1 0 1 1 1   Altered sleeping 2 3 2 2 1   Tired, decreased energy 1 3 2 1 1   Change in appetite 1 3 2 1 1   Feeling bad or failure about yourself  1 0 0 0 0  Trouble concentrating 1 2 1 1 1   Moving slowly or fidgety/restless 1 0 0 0 1  Suicidal thoughts 0 0 0 0 0  PHQ-9 Score 8 11 8 6 6   Some recent data might be hidden   GAD 7 : Generalized Anxiety Score 11/09/2018 07/28/2018 05/03/2018 09/02/2017  Nervous, Anxious, on Edge 3 0 1 1  Control/stop worrying 1 0 0 2  Worry too much - different things 2 0 1 2  Trouble relaxing 3 0 1 1  Restless 2 0 0 1  Easily annoyed or irritable 3 0 1 1  Afraid - awful might happen - 0 1 -  Total GAD 7 Score - 0 5 -  Anxiety Difficulty - - - -      Observations/Objective: No face to face observation.   Assessment and Plan:  Diagnoses and all orders for this  visit:  Anxiety and depression -     escitalopram (LEXAPRO) 10 MG tablet; Take 1 tablet (10 mg total) by mouth daily for 30 days. -     hydrOXYzine (ATARAX/VISTARIL) 25 MG tablet; Take 1 tablet (25 mg total) by mouth 3 (three) times daily as needed for up to 30 days.  Mild intermittent asthma without complication -     Fluticasone-Salmeterol (ADVAIR) 250-50 MCG/DOSE AEPB; Inhale 1 puff into the lungs 2 (two) times daily. -     cetirizine (ZYRTEC) 10 MG tablet; Take 1 tablet (10 mg total) by mouth daily. She has been out of her inhaler for over a week. She will pick up her medications from the pharmacy today.    Follow Up Instructions: Follow up in 2-3 weeks for anxiety and depression.    I discussed the assessment and treatment plan with the patient. The patient was provided an opportunity to ask questions and all were answered. The patient agreed with the plan and demonstrated an understanding of the instructions.   The patient was advised to call back or seek an in-person evaluation if the symptoms worsen or if the condition fails to improve as anticipated.  I provided 16 minutes of non-face-to-face time during this encounter.   Claiborne Rigg,  NP

## 2019-01-19 ENCOUNTER — Encounter: Payer: Self-pay | Admitting: Nurse Practitioner

## 2019-01-19 ENCOUNTER — Ambulatory Visit: Payer: Self-pay | Attending: Nurse Practitioner | Admitting: Nurse Practitioner

## 2019-01-19 ENCOUNTER — Other Ambulatory Visit: Payer: Self-pay

## 2019-01-19 DIAGNOSIS — F329 Major depressive disorder, single episode, unspecified: Secondary | ICD-10-CM

## 2019-01-19 DIAGNOSIS — F419 Anxiety disorder, unspecified: Secondary | ICD-10-CM

## 2019-01-19 DIAGNOSIS — E559 Vitamin D deficiency, unspecified: Secondary | ICD-10-CM

## 2019-01-19 MED ORDER — HYDROXYZINE HCL 25 MG PO TABS: 25.0000 mg | ORAL_TABLET | Freq: Three times a day (TID) | ORAL | 3 refills | Status: DC | PRN

## 2019-01-19 MED ORDER — VITAMIN D (ERGOCALCIFEROL) 1.25 MG (50000 UNIT) PO CAPS: 50000.0000 [IU] | ORAL_CAPSULE | ORAL | 0 refills | Status: DC

## 2019-01-19 MED ORDER — SERTRALINE HCL 25 MG PO TABS: 25.0000 mg | ORAL_TABLET | Freq: Every day | ORAL | 1 refills | Status: DC

## 2019-01-19 MED FILL — ?SERTRALINE HCL 25MG TABS: 25 | 30 days supply | Qty: 30 | Fill #0

## 2019-01-19 MED FILL — hydrOXYzine HCL 25 MG TABS: 25 | 20 days supply | Qty: 60 | Fill #0

## 2019-01-19 MED FILL — VIT D2 1.25 MG (50,000 UNIT: 1.25 MG | 84 days supply | Qty: 12 | Fill #0

## 2019-01-19 NOTE — Progress Notes (Signed)
Virtual Visit via Telephone Note Due to national recommendations of social distancing due to COVID 19, telehealth visit is felt to be most appropriate for this patient at this time.  I discussed the limitations, risks, security and privacy concerns of performing an evaluation and management service by telephone and the availability of in person appointments. I also discussed with the patient that there may be a patient responsible charge related to this service. The patient expressed understanding and agreed to proceed.    I connected with Karina Murphy on 01/19/19  at   2:50 PM EDT  EDT by telephone and verified that I am speaking with the correct person using two identifiers.   Consent I discussed the limitations, risks, security and privacy concerns of performing an evaluation and management service by telephone and the availability of in person appointments. I also discussed with the patient that there may be a patient responsible charge related to this service. The patient expressed understanding and agreed to proceed.   Location of Patient: Private Residence   [facility] of Provider: [facility]    Persons participating in Telemedicine visit: [name] FNP-BC [facility] [place] CMA Karina Murphy  Interpreter Wilmon Pali [phone]   History of Present Illness: Telemedicine visit for: Anxiety and Depression She was started on lexapro several weeks ago however states it has been causing her GI upset: nausea with some vomiting. States the hydroxyzine seems to help more with her panic attacks. Will start zoloft, stop lexapro and keep her on hydroxyzine. She denies any thoughts of self harm.  Depression screen [facility] 2/9 01/19/2019 11/09/2018 07/28/2018 05/03/2018 09/02/2017  Decreased Interest 1 1 0 1 1  Down, Depressed, Hopeless 0 - 0 0 0  PHQ - 2 Score 1 1 0 1 1  Altered sleeping 0 2 3 2 2   Tired, decreased energy 0 1 3 2 1   Change in appetite 1 1 3 2 1    Feeling bad or failure about yourself  0 1 0 0 0  Trouble concentrating 1 1 2 1 1   Moving slowly or fidgety/restless 0 1 0 0 0  Suicidal thoughts 0 0 0 0 0  PHQ-9 Score 3 8 11 8 6   Some recent data might be hidden   GAD 7 : Generalized Anxiety Score 01/19/2019 11/09/2018 07/28/2018 05/03/2018  Nervous, Anxious, on Edge 1 3 0 1  Control/stop worrying 2 1 0 0  Worry too much - different things 2 2 0 1  Trouble relaxing 1 3 0 1  Restless 1 2 0 0  Easily annoyed or irritable 1 3 0 1  Afraid - awful might happen 2 - 0 1  Total GAD 7 Score 10 - 0 5  Anxiety Difficulty - - - -      Past Medical History:  Diagnosis Date  . Anxiety   . Arthritis, rheumatoid (HCC)   . Chronic headaches   . Hypertension   . Urticaria     Past Surgical History:  Procedure Laterality Date  . NO PAST SURGERIES      Family History  Problem Relation Age of Onset  . Diabetes Mother   . Hypertension Mother   . Stomach cancer Neg Hx   . Colon cancer Neg Hx     Social History   Socioeconomic History  . Marital status: Single    Spouse name: Not on file  . Number of children: 2  . Years of education: Not on file  . Highest education level:  Not on file  Occupational History  . Not on file  Social Needs  . Financial resource strain: Not on file  . Food insecurity:    Worry: Not on file    Inability: Not on file  . Transportation needs:    Medical: Not on file    Non-medical: Not on file  Tobacco Use  . Smoking status: Never Smoker  . Smokeless tobacco: Never Used  Substance and Sexual Activity  . Alcohol use: No  . Drug use: No  . Sexual activity: Not Currently    Birth control/protection: Condom  Lifestyle  . Physical activity:    Days per week: Not on file    Minutes per session: Not on file  . Stress: Not on file  Relationships  . Social connections:    Talks on phone: Not on file    Gets together: Not on file    Attends religious service: Not on file    Active member of club or  organization: Not on file    Attends meetings of clubs or organizations: Not on file    Relationship status: Not on file  Other Topics Concern  . Not on file  Social History Narrative   Completed 2nd grade.      Observations/Objective: Awake, alert and oriented x 3   Review of Systems  Constitutional: Negative for fever, malaise/fatigue and weight loss.  HENT: Negative.  Negative for nosebleeds.   Eyes: Negative.  Negative for blurred vision, double vision and photophobia.  Respiratory: Negative.  Negative for cough and shortness of breath.   Cardiovascular: Negative.  Negative for chest pain, palpitations and leg swelling.  Gastrointestinal: Negative.  Negative for heartburn, nausea and vomiting.  Musculoskeletal: Negative.  Negative for myalgias.  Neurological: Negative.  Negative for dizziness, focal weakness, seizures and headaches.  Psychiatric/Behavioral: Positive for depression. Negative for suicidal ideas. The patient is nervous/anxious.     Assessment and Plan: Diagnoses and all orders for this visit:  Anxiety and depression -     hydrOXYzine (ATARAX/VISTARIL) 25 MG tablet; Take 1 tablet (25 mg total) by mouth 3 (three) times daily as needed for up to 30 days. -     sertraline (ZOLOFT) 25 MG tablet; Take 1 tablet (25 mg total) by mouth daily for 30 days. May increase to 2 (two) tablets after 7 days if needed.  Vitamin D deficiency disease -     Vitamin D, Ergocalciferol, (DRISDOL) 1.25 MG (50000 UT) CAPS capsule; Take 1 capsule (50,000 Units total) by mouth every 7 (seven) days.     Follow Up Instructions Return in about 3 weeks (around 02/09/2019) for anxiety/depression .     I discussed the assessment and treatment plan with the patient. The patient was provided an opportunity to ask questions and all were answered. The patient agreed with the plan and demonstrated an understanding of the instructions.   The patient was advised to call back or seek an in-person  evaluation if the symptoms worsen or if the condition fails to improve as anticipated.  I provided 34 minutes of non-face-to-face time during this encounter including median intraservice time, reviewing previous notes, labs, imaging, medications and explaining diagnosis and management.  Claiborne Rigg, FNP-BC

## 2019-02-09 ENCOUNTER — Encounter: Payer: Self-pay | Admitting: Nurse Practitioner

## 2019-02-09 ENCOUNTER — Ambulatory Visit: Payer: Self-pay | Attending: Nurse Practitioner | Admitting: Nurse Practitioner

## 2019-02-09 DIAGNOSIS — F329 Major depressive disorder, single episode, unspecified: Secondary | ICD-10-CM

## 2019-02-09 DIAGNOSIS — F419 Anxiety disorder, unspecified: Secondary | ICD-10-CM

## 2019-02-09 DIAGNOSIS — R7982 Elevated C-reactive protein (CRP): Secondary | ICD-10-CM

## 2019-02-09 DIAGNOSIS — M05741 Rheumatoid arthritis with rheumatoid factor of right hand without organ or systems involvement: Secondary | ICD-10-CM

## 2019-02-09 MED ORDER — HYDROXYZINE HCL 25 MG PO TABS: 25.0000 mg | ORAL_TABLET | Freq: Three times a day (TID) | ORAL | 3 refills | Status: AC | PRN

## 2019-02-09 MED ORDER — SERTRALINE HCL 25 MG PO TABS: 25.0000 mg | ORAL_TABLET | Freq: Every day | ORAL | 1 refills | Status: DC

## 2019-02-09 NOTE — Progress Notes (Signed)
Virtual Visit via Telephone Note Due to national recommendations of social distancing due to COVID 19, telehealth visit is felt to be most appropriate for this patient at this time.  I discussed the limitations, risks, security and privacy concerns of performing an evaluation and management service by telephone and the availability of in person appointments. I also discussed with the patient that there may be a patient responsible charge related to this service. The patient expressed understanding and agreed to proceed.    I connected with Karina Murphy on 02/09/19  at   2:30 PM EDT  EDT by telephone and verified that I am speaking with the correct person using two identifiers.   Consent I discussed the limitations, risks, security and privacy concerns of performing an evaluation and management service by telephone and the availability of in person appointments. I also discussed with the patient that there may be a patient responsible charge related to this service. The patient expressed understanding and agreed to proceed.   Location of Patient: Private  Residence   [facility]    Persons participating in Telemedicine visit: Bertram DenverZelda Everleigh Colclasure FNP-BC YY [employer] CMA Patria Dicie BeamMedrano-Lluck  Fabian ID #[id number]   History of Present Illness: Telemedicine visit for: Follow up anxiety and depression.  has a past medical history of Anxiety, Arthritis, rheumatoid (HCC), Chronic headaches, Hypertension, and Urticaria.  Rheumatoid Arthritis: Patient complains of rheumatoid arthritis. She was referred to rheumatology 04-2018.  Symptoms have been worsening for a few weeks. She was diagnosed several years ago. Symptoms include joint pain and swelling along with morning stiffness of moderate severity. Aggravating factors: NONE.  Patient denies numbness of hands. Symptoms are helped by arthritis medications. Limitation on activities include difficulty  with ADLs - holding objects. Other medications:  Methotrexate, prednisone, sulfasalazine. She is currently taking Artridol 25 mg as needed for her hand pain. She endorses left elbow nodules and several right hand nodules. Will refer to rheumatology and repeat labs. Her last CRP was 2015.   Anxiety and Depression Taking zoloft 25 mg daily and hydroxyzine 25 mg TID prn. She does endorse insomnia. She was taken off lexapro 10mg  at her last visit as it was causing GI upset. She denies any thoughts of self harm at this time. Doing well overall and PHQ9 score has improved as well as GAD.  [facility] 02/09/2019 01/19/2019 11/09/2018 07/28/2018 05/03/2018  Decreased Interest 0 1 1 0 1  Down, Depressed, Hopeless 0 0 - 0 0  PHQ - 2 Score 0 1 1 0 1  Altered sleeping 1 0 2 3 2   Tired, decreased energy 0 0 1 3 2   Change in appetite 0 1 1 3 2   Feeling bad or failure about yourself  0 0 1 0 0  Trouble concentrating 0 1 1 2 1   Moving slowly or fidgety/restless 0 0 1 0 0  Suicidal thoughts 0 0 0 0 0  PHQ-9 Score 1 3 8 11 8   Some recent data might be hidden   GAD 7 : Generalized Anxiety Score 02/09/2019 01/19/2019 11/09/2018 07/28/2018  Nervous, Anxious, on Edge 0 1 3 0  Control/stop worrying 1 2 1  0  Worry too much - different things 1 2 2  0  Trouble relaxing 0 1 3 0  Restless 0 1 2 0  Easily annoyed or irritable 1 1 3  0  Afraid - awful might happen 0 2 - 0  Total GAD 7 Score 3 10 -  0  Anxiety Difficulty - - - -       Past Medical History:  Diagnosis Date  . Anxiety   . Arthritis, rheumatoid (HCC)   . Chronic headaches   . Hypertension   . Urticaria     Past Surgical History:  Procedure Laterality Date  . NO PAST SURGERIES      Family History  Problem Relation Age of Onset  . Diabetes Mother   . Hypertension Mother   . Stomach cancer Neg Hx   . Colon cancer Neg Hx     Social History   Socioeconomic History  . Marital status: Single    Spouse name: Not on file  . Number  of children: 2  . Years of education: Not on file  . Highest education level: Not on file  Occupational History  . Not on file  Social Needs  . Financial resource strain: Not on file  . Food insecurity:    Worry: Not on file    Inability: Not on file  . Transportation needs:    Medical: Not on file    Non-medical: Not on file  Tobacco Use  . Smoking status: Never Smoker  . Smokeless tobacco: Never Used  Substance and Sexual Activity  . Alcohol use: No  . Drug use: No  . Sexual activity: Not Currently    Birth control/protection: Condom  Lifestyle  . Physical activity:    Days per week: Not on file    Minutes per session: Not on file  . Stress: Not on file  Relationships  . Social connections:    Talks on phone: Not on file    Gets together: Not on file    Attends religious service: Not on file    Active member of club or organization: Not on file    Attends meetings of clubs or organizations: Not on file    Relationship status: Not on file  Other Topics Concern  . Not on file  Social History Narrative   Completed 2nd grade.      Observations/Objective: Awake, alert and oriented x 3   Review of Systems  Constitutional: Negative for fever, malaise/fatigue and weight loss.  HENT: Negative.  Negative for nosebleeds.   Eyes: Negative.  Negative for blurred vision, double vision and photophobia.  Respiratory: Negative.  Negative for cough and shortness of breath.   Cardiovascular: Negative.  Negative for chest pain, palpitations and leg swelling.  Gastrointestinal: Negative.  Negative for heartburn, nausea and vomiting.  Musculoskeletal: Positive for joint pain. Negative for myalgias.  Neurological: Positive for headaches. Negative for dizziness, focal weakness and seizures.  Psychiatric/Behavioral: Positive for depression. Negative for hallucinations, memory loss, substance abuse and suicidal ideas. The patient is nervous/anxious and has insomnia.     Assessment and  Plan: Maeli was evaluated today for follow-up.  Diagnoses and all orders for this visit:  Anxiety and depression -     sertraline (ZOLOFT) 25 MG tablet; Take 1 tablet (25 mg total) by mouth daily for 30 days. -     hydrOXYzine (ATARAX/VISTARIL) 25 MG tablet; Take 1 tablet (25 mg total) by mouth 3 (three) times daily as needed for up to 30 days.  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     I discussed the assessment and treatment plan with the patient. The patient was provided an opportunity to ask questions and all were answered. The patient agreed with the plan and demonstrated an understanding of the instructions.   The patient was advised to call back or seek an in-person evaluation if the symptoms worsen or if the condition fails to improve as anticipated.  I provided 18 minutes of non-face-to-face time during this encounter including median intraservice time, reviewing previous notes, labs, imaging, medications and explaining diagnosis and management.  Claiborne Rigg, FNP-BC

## 2019-04-11 ENCOUNTER — Ambulatory Visit: Payer: Self-pay | Admitting: Nurse Practitioner

## 2019-04-16 ENCOUNTER — Other Ambulatory Visit: Payer: Self-pay

## 2019-04-16 ENCOUNTER — Encounter: Payer: Self-pay | Admitting: Nurse Practitioner

## 2019-04-16 ENCOUNTER — Ambulatory Visit: Payer: Self-pay | Attending: Nurse Practitioner | Admitting: Nurse Practitioner

## 2019-04-16 VITALS — BP 104/74 | HR 80 | Temp 99.1°F | Ht 64.0 in | Wt 200.0 lb

## 2019-04-16 DIAGNOSIS — E559 Vitamin D deficiency, unspecified: Secondary | ICD-10-CM

## 2019-04-16 DIAGNOSIS — K219 Gastro-esophageal reflux disease without esophagitis: Secondary | ICD-10-CM

## 2019-04-16 DIAGNOSIS — R21 Rash and other nonspecific skin eruption: Secondary | ICD-10-CM

## 2019-04-16 DIAGNOSIS — M869 Osteomyelitis, unspecified: Secondary | ICD-10-CM | POA: Insufficient documentation

## 2019-04-16 DIAGNOSIS — J4520 Mild intermittent asthma, uncomplicated: Secondary | ICD-10-CM

## 2019-04-16 DIAGNOSIS — M059 Rheumatoid arthritis with rheumatoid factor, unspecified: Secondary | ICD-10-CM

## 2019-04-16 DIAGNOSIS — F329 Major depressive disorder, single episode, unspecified: Secondary | ICD-10-CM

## 2019-04-16 DIAGNOSIS — F419 Anxiety disorder, unspecified: Secondary | ICD-10-CM

## 2019-04-16 HISTORY — DX: Osteomyelitis, unspecified: M86.9

## 2019-04-16 MED ORDER — TRIAMCINOLONE ACETONIDE 0.1 % EX CREA: 1.0000 "application " | TOPICAL_CREAM | Freq: Two times a day (BID) | CUTANEOUS | 1 refills | Status: DC

## 2019-04-16 MED ORDER — OMEPRAZOLE 20 MG PO CPDR: 20.0000 mg | DELAYED_RELEASE_CAPSULE | Freq: Every day | ORAL | 3 refills | Status: DC

## 2019-04-16 MED ORDER — FLUTICASONE PROPIONATE 50 MCG/ACT NA SUSP: 1.0000 | Freq: Every day | NASAL | 0 refills | Status: DC

## 2019-04-16 MED FILL — !VENTOLIN HFA INHALER: 108 (90 BAS | 25 days supply | Qty: 18 | Fill #0

## 2019-04-16 NOTE — Progress Notes (Signed)
Assessment & Plan:  Karina Murphy was seen today for follow-up.  Diagnoses and all orders for this visit:  Anxiety and depression Controlled. Stable. Continue zoloft as prescribed. She does not require any refills today.   Mild intermittent asthma without complication -     fluticasone (FLONASE) 50 MCG/ACT nasal spray; Place 1 spray into both nostrils daily.  Gastroesophageal reflux disease without esophagitis -     omeprazole (PRILOSEC) 20 MG capsule; Take 1 capsule (20 mg total) by mouth daily. Please fill as 90 day supply. INSTRUCTIONS: Avoid GERD Triggers: acidic, spicy or fried foods, caffeine, coffee, sodas,  alcohol and chocolate.   Vitamin D deficiency disease -     VITAMIN D 25 Hydroxy (Vit-D Deficiency, Fractures)  Rheumatoid arthritis with positive rheumatoid factor, involving unspecified site (HCC) -     Rheumatoid Arthritis Profile -     C-reactive protein  Osteitis of pelvic region (HCC) -     Rheumatoid Arthritis Profile -     C-reactive protein  Rash, skin -     triamcinolone cream (KENALOG) 0.1 %; Apply 1 application topically 2 (two) times daily.    Patient has been counseled on age-appropriate routine health concerns for screening and prevention. These are reviewed and up-to-date. Referrals have been placed accordingly. Immunizations are up-to-date or declined.    Subjective:   Chief Complaint  Patient presents with  . Follow-up    Pt. is here for depression and anxiety follow up. Pt. stated she is feeling much better.    HPI Karina Murphy 38 y.o. female presents to office today for follow up anxiety and depression. VRI was used to communicate directly with patient for the entire encounter including providing detailed patient instructions.    Anxiety and Depression Notes improved mood and anxiety symptoms since taking zoloft 25 mg daily. Will not make any changes at this time. Denies any thoughts of self harm.  Depression screen [facility] 2/9 04/16/2019  02/09/2019 01/19/2019 11/09/2018 07/28/2018  Decreased Interest 1 0 1 1 0  Down, Depressed, Hopeless 0 0 0 - 0  PHQ - 2 Score 1 0 1 1 0  Altered sleeping 1 1 0 2 3  Tired, decreased energy 1 0 0 1 3  Change in appetite 1 0 1 1 3   Feeling bad or failure about yourself  0 0 0 1 0  Trouble concentrating 1 0 1 1 2   Moving slowly or fidgety/restless 0 0 0 1 0  Suicidal thoughts 0 0 0 0 0  PHQ-9 Score 5 1 3 8 11   Some recent data might be hidden   GAD 7 : Generalized Anxiety Score 02/09/2019 01/19/2019 11/09/2018 07/28/2018  Nervous, Anxious, on Edge 0 1 3 0  Control/stop worrying 1 2 1  0  Worry too much - different things 1 2 2  0  Trouble relaxing 0 1 3 0  Restless 0 1 2 0  Easily annoyed or irritable 1 1 3  0  Afraid - awful might happen 0 2 - 0  Total GAD 7 Score 3 10 - 0  Anxiety Difficulty - - - -    GERD She ran out of omeprazole "a while ago". Notes increased GERD symptoms since running out of her medication. Symptoms include increased acid and epigastric pain.    Rash Patient complains of rash involving the left tricep area. Rash started a few weeks ago.  Rash has not changed over time.  Discomfort associated with rash: is pruritic.  Associated symptoms: none.  Patient  has not had previous evaluation of rash. Patient has not had previous treatment.   Patient has not had contacts with similar rash. Patient has not identified precipitant. Patient has not had new exposures (soaps, lotions, laundry detergents, foods, medications, plants, insects or animals.)   Review of Systems  Constitutional: Negative for fever, malaise/fatigue and weight loss.  HENT: Negative.  Negative for nosebleeds.   Eyes: Negative.  Negative for blurred vision, double vision and photophobia.  Respiratory: Negative.  Negative for cough and shortness of breath.   Cardiovascular: Negative.  Negative for chest pain, palpitations and leg swelling.  Gastrointestinal: Positive for heartburn. Negative for nausea and  vomiting.  Musculoskeletal: Positive for joint pain. Negative for myalgias.  Skin: Positive for itching and rash.  Neurological: Negative.  Negative for dizziness, focal weakness, seizures and headaches.  Psychiatric/Behavioral: Positive for depression. Negative for suicidal ideas. The patient is nervous/anxious.     Past Medical History:  Diagnosis Date  . Anxiety   . Arthritis, rheumatoid ([place])   . Chronic headaches   . GERD (gastroesophageal reflux disease)   . Hypertension   . Urticaria     Past Surgical History:  Procedure Laterality Date  . NO PAST SURGERIES      Family History  Problem Relation Age of Onset  . Diabetes Mother   . Hypertension Mother   . Stomach cancer Neg Hx   . Colon cancer Neg Hx     Social History Reviewed with no changes to be made today.   Outpatient Medications Prior to Visit  Medication Sig Dispense Refill  . albuterol (PROVENTIL HFA;VENTOLIN HFA) 108 (90 Base) MCG/ACT inhaler Inhale 2 puffs into the lungs every 6 (six) hours as needed for wheezing or shortness of breath. 1 Inhaler 2  . cetirizine (ZYRTEC) 10 MG tablet Take 1 tablet (10 mg total) by mouth daily. 30 tablet 11  . Fluticasone-Salmeterol (ADVAIR) 250-50 MCG/DOSE AEPB Inhale 1 puff into the lungs 2 (two) times daily. 60 each 2  . Vitamin D, Ergocalciferol, (DRISDOL) 1.25 MG (50000 UT) CAPS capsule Take 1 capsule (50,000 Units total) by mouth every 7 (seven) days. 12 capsule 0  . sertraline (ZOLOFT) 25 MG tablet Take 1 tablet (25 mg total) by mouth daily for 30 days. 30 tablet 1  . fluticasone (FLONASE) 50 MCG/ACT nasal spray Place 1 spray into both nostrils daily. 16 g 0  . omeprazole (PRILOSEC) 20 MG capsule Take 1 capsule (20 mg total) by mouth daily. (Patient not taking: Reported on 11/09/2018) 30 capsule 3   No facility-administered medications prior to visit.     Allergies  Allergen Reactions  . Eggs Or Egg-Derived Products Nausea And Vomiting    Nausea/vomitting only with  whole eggs, no problem with eggs used in ingredients  . Pineapple        Objective:    BP 104/74 (BP Location: Left Arm, Patient Position: Sitting, Cuff Size: Large)   Pulse 80   Temp 99.1 F (37.3 C) (Oral)   Ht 5\' 4"  (1.626 m)   Wt 200 lb (90.7 kg)   LMP 04/16/2019   SpO2 100%   BMI 34.33 kg/m  Wt Readings from Last 3 Encounters:  04/16/19 200 lb (90.7 kg)  11/09/18 189 lb (85.7 kg)  10/10/18 188 lb (85.3 kg)    Physical Exam Vitals signs and nursing note reviewed.  Constitutional:      Appearance: She is well-developed.  HENT:     Head: Normocephalic and atraumatic.  Neck:  Musculoskeletal: Normal range of motion.  Cardiovascular:     Rate and Rhythm: Normal rate and regular rhythm.     Heart sounds: Normal heart sounds. No murmur. No friction rub. No gallop.   Pulmonary:     Effort: Pulmonary effort is normal. No tachypnea or respiratory distress.     Breath sounds: Normal breath sounds. No decreased breath sounds, wheezing, rhonchi or rales.  Chest:     Chest wall: No tenderness.  Abdominal:     General: Bowel sounds are normal.     Palpations: Abdomen is soft.  Musculoskeletal: Normal range of motion.  Skin:    General: Skin is warm and dry.     Findings: Rash present. Rash is macular.       Neurological:     Mental Status: She is alert and oriented to person, place, and time.     Coordination: Coordination normal.  Psychiatric:        Behavior: Behavior normal. Behavior is cooperative.        Thought Content: Thought content normal.        Judgment: Judgment normal.        Patient has been counseled extensively about nutrition and exercise as well as the importance of adherence with medications and regular follow-up. The patient was given clear instructions to go to ER or return to medical center if symptoms don't improve, worsen or new problems develop. The patient verbalized understanding.   Follow-up: Return if symptoms worsen or fail to  improve.   Claiborne RiggZelda W Adore Kithcart, FNP-BC [facility] [place], [place] [phone]   04/16/2019, 6:25 PM

## 2019-04-17 MED FILL — FLUTICASONE PROP 50 MCG SPR: 50 | 30 days supply | Qty: 16 | Fill #0

## 2019-04-17 MED FILL — OMEPRAZOLE 20 MG CAP: 20 | 90 days supply | Qty: 90 | Fill #0

## 2019-04-17 MED FILL — ?TRIAMCINOLONE 0.1% CRM: 0.1 | 30 days supply | Qty: 60 | Fill #0

## 2019-04-18 LAB — RHEUMATOID ARTHRITIS PROFILE
Cyclic Citrullin Peptide Ab: 37 units — ABNORMAL HIGH (ref 0–19)
Rheumatoid fact SerPl-aCnc: 586.5 IU/mL — ABNORMAL HIGH (ref 0.0–13.9)

## 2019-04-18 LAB — VITAMIN D 25 HYDROXY (VIT D DEFICIENCY, FRACTURES): Vit D, 25-Hydroxy: 38.8 ng/mL (ref 30.0–100.0)

## 2019-04-18 LAB — C-REACTIVE PROTEIN: CRP: 19 mg/L — ABNORMAL HIGH (ref 0–10)

## 2019-04-21 ENCOUNTER — Other Ambulatory Visit: Payer: Self-pay | Admitting: Nurse Practitioner

## 2019-04-21 DIAGNOSIS — M059 Rheumatoid arthritis with rheumatoid factor, unspecified: Secondary | ICD-10-CM

## 2019-04-23 ENCOUNTER — Other Ambulatory Visit: Payer: Self-pay

## 2019-04-23 ENCOUNTER — Ambulatory Visit: Payer: Self-pay | Attending: Family Medicine

## 2019-05-08 ENCOUNTER — Telehealth: Payer: Self-pay | Admitting: Nurse Practitioner

## 2019-05-08 NOTE — Telephone Encounter (Signed)
Patient called to inform she now has [facility] for her rheumatology referral. Please follow up.

## 2019-05-09 NOTE — Telephone Encounter (Signed)
Dr Jules Husbands is the only one who see patients with cafa  And she denied before  . I spoke to patient and she said  That her  provider told her that the referral was going to be sent again with  s more labs with diagnosis .

## 2019-08-24 ENCOUNTER — Encounter

## 2019-08-24 ENCOUNTER — Other Ambulatory Visit: Payer: Self-pay

## 2019-08-24 ENCOUNTER — Ambulatory Visit: Payer: Self-pay | Attending: Nurse Practitioner | Admitting: Nurse Practitioner

## 2019-08-24 DIAGNOSIS — M069 Rheumatoid arthritis, unspecified: Secondary | ICD-10-CM

## 2019-08-24 DIAGNOSIS — R1084 Generalized abdominal pain: Secondary | ICD-10-CM

## 2019-08-24 DIAGNOSIS — Z13220 Encounter for screening for lipoid disorders: Secondary | ICD-10-CM

## 2019-08-24 DIAGNOSIS — K219 Gastro-esophageal reflux disease without esophagitis: Secondary | ICD-10-CM

## 2019-08-24 MED ORDER — PANTOPRAZOLE SODIUM 40 MG PO TBEC: 40.0000 mg | DELAYED_RELEASE_TABLET | Freq: Every day | ORAL | 1 refills | Status: DC

## 2019-08-24 MED FILL — PANTOPRAZOLE SOD DR 40 MG T: 40 | 30 days supply | Qty: 30 | Fill #0

## 2019-08-24 NOTE — Progress Notes (Signed)
Virtual Visit via Telephone Note Due to national recommendations of social distancing due to [place] 19, telehealth visit is felt to be most appropriate for this patient at this time.  I discussed the limitations, risks, security and privacy concerns of performing an evaluation and management service by telephone and the availability of in person appointments. I also discussed with the patient that there may be a patient responsible charge related to this service. The patient expressed understanding and agreed to proceed.    I connected with Sharissa Medrano-Lluck on 08/25/19  at   2:30 PM EST  EDT by telephone and verified that I am speaking with the correct person using two identifiers.   Consent I discussed the limitations, risks, security and privacy concerns of performing an evaluation and management service by telephone and the availability of in person appointments. I also discussed with the patient that there may be a patient responsible charge related to this service. The patient expressed understanding and agreed to proceed.   Location of Patient: Private Residence   Location of Provider: [place] [facility] [place] participating in Telemedicine visit: Geryl Rankins FNP-BC [place]    History of Present Illness: Telemedicine visit for:  GI SYMPTOMS  GERD This has been associated with abdominal bloating, bilious reflux, early satiety, heartburn, midespigastric pain, regurgitation of undigested food and vomiting (after eating).  She denies hematemesis and melena. Symptoms have been present for several months. She denies dysphagia.  She has not lost weight. She denies melena, hematochezia, hematemesis, and coffee ground emesis. Medical therapy in the past has included H2 antagonists and proton pump inhibitors. She states zantac seemed to provide the most relief however zantac is recalled so not available to be refilled at this time.   She also endorses Dizziness and Fatigue. States she has tried to stop eating spicy and fatty foods. Cut back on coffee.      Past Medical History:  Diagnosis Date  . Anxiety   . Arthritis, rheumatoid ([place])   . Chronic headaches   . GERD (gastroesophageal reflux disease)   . Hypertension   . Urticaria     Past Surgical History:  Procedure Laterality Date  . NO PAST SURGERIES      Family History  Problem Relation Age of Onset  . Diabetes Mother   . Hypertension Mother   . Stomach cancer Neg Hx   . Colon cancer Neg Hx     Social History   Socioeconomic History  . Marital status: Single    Spouse name: Not on file  . Number of children: 2  . Years of education: Not on file  . Highest education level: Not on file  Occupational History  . Not on file  Tobacco Use  . Smoking status: Never Smoker  . Smokeless tobacco: Never Used  Substance and Sexual Activity  . Alcohol use: No  . Drug use: No  . Sexual activity: Not Currently    Birth control/protection: Condom  Other Topics Concern  . Not on file  Social History Narrative   Completed 2nd grade.    Social Determinants of Health   Financial Resource Strain:   . Difficulty of Paying Living Expenses: Not on file  Food Insecurity:   . Worried About Charity fundraiser in the Last Year: Not on file  . Ran Out of Food in the Last Year: Not on file  Transportation Needs:   . Lack of Transportation (Medical): Not  on file  . Lack of Transportation (Non-Medical): Not on file  Physical Activity:   . Days of Exercise per Week: Not on file  . Minutes of Exercise per Session: Not on file  Stress:   . Feeling of Stress : Not on file  Social Connections:   . Frequency of Communication with Friends and Family: Not on file  . Frequency of Social Gatherings with Friends and Family: Not on file  . Attends Religious Services: Not on file  . Active Member of Clubs or Organizations: Not on file  . Attends Archivist  Meetings: Not on file  . Marital Status: Not on file     Observations/Objective: Awake, alert and oriented x 3   Review of Systems  Constitutional: Positive for malaise/fatigue. Negative for fever and weight loss.  HENT: Negative.  Negative for nosebleeds.   Eyes: Negative.  Negative for blurred vision, double vision and photophobia.  Respiratory: Negative.  Negative for cough and shortness of breath.   Cardiovascular: Negative.  Negative for chest pain, palpitations and leg swelling.  Gastrointestinal: Positive for abdominal pain, heartburn and vomiting. Negative for blood in stool, constipation, diarrhea, melena and nausea.  Musculoskeletal: Negative.  Negative for myalgias.  Neurological: Positive for dizziness. Negative for focal weakness, seizures and headaches.  Psychiatric/Behavioral: Negative for suicidal ideas.       Panic attacks    Assessment and Plan: Eron was seen today for dizziness and fatigue.  Diagnoses and all orders for this visit:  Gastroesophageal reflux disease without esophagitis -     pantoprazole (PROTONIX) 40 MG tablet; Take 1 tablet (40 mg total) by mouth daily. -     CBC; Future INSTRUCTIONS: Avoid GERD Triggers: acidic, spicy or fried foods, caffeine, coffee, sodas,  alcohol and chocolate.   Generalized abdominal pain -     CMP14+EGFR; Future -     TSH; Future -     Urinalysis, Complete; Future  Lipid screening -     Lipid Panel; Future  Rheumatoid arthritis, involving unspecified site, unspecified whether rheumatoid factor present [facility]) -     Ambulatory referral to Rheumatology     Follow Up Instructions Return in about 4 weeks (around 09/21/2019) for GERD, H PYLORI test.     I discussed the assessment and treatment plan with the patient. The patient was provided an opportunity to ask questions and all were answered. The patient agreed with the plan and demonstrated an understanding of the instructions.   The patient was advised to call  back or seek an in-person evaluation if the symptoms worsen or if the condition fails to improve as anticipated.  I provided 19 minutes of non-face-to-face time during this encounter including median intraservice time, reviewing previous notes, labs, imaging, medications and explaining diagnosis and management.  Gildardo Pounds, FNP-BC

## 2019-08-25 ENCOUNTER — Encounter: Payer: Self-pay | Admitting: Nurse Practitioner

## 2019-08-27 ENCOUNTER — Other Ambulatory Visit: Payer: Self-pay

## 2019-08-27 ENCOUNTER — Ambulatory Visit: Payer: Self-pay | Attending: Nurse Practitioner

## 2019-08-27 DIAGNOSIS — R1084 Generalized abdominal pain: Secondary | ICD-10-CM

## 2019-08-27 DIAGNOSIS — K219 Gastro-esophageal reflux disease without esophagitis: Secondary | ICD-10-CM

## 2019-08-27 DIAGNOSIS — Z13220 Encounter for screening for lipoid disorders: Secondary | ICD-10-CM

## 2019-08-28 LAB — CMP14+EGFR
ALT: 8 IU/L (ref 0–32)
AST: 13 IU/L (ref 0–40)
Albumin/Globulin Ratio: 1.3 (ref 1.2–2.2)
Albumin: 4 g/dL (ref 3.8–4.8)
Alkaline Phosphatase: 70 IU/L (ref 39–117)
BUN/Creatinine Ratio: 19 (ref 9–23)
BUN: 14 mg/dL (ref 6–20)
Bilirubin Total: 0.7 mg/dL (ref 0.0–1.2)
CO2: 25 mmol/L (ref 20–29)
Calcium: 9.5 mg/dL (ref 8.7–10.2)
Chloride: 103 mmol/L (ref 96–106)
Creatinine, Ser: 0.75 mg/dL (ref 0.57–1.00)
GFR calc Af Amer: 119 mL/min/{1.73_m2} (ref >59)
GFR calc non Af Amer: 103 mL/min/{1.73_m2} (ref >59)
Globulin, Total: 3.2 g/dL (ref 1.5–4.5)
Glucose: 86 mg/dL (ref 65–99)
Potassium: 4.6 mmol/L (ref 3.5–5.2)
Sodium: 141 mmol/L (ref 134–144)
Total Protein: 7.2 g/dL (ref 6.0–8.5)

## 2019-08-28 LAB — LIPID PANEL
Chol/HDL Ratio: 3.5 ratio (ref 0.0–4.4)
Cholesterol, Total: 138 mg/dL (ref 100–199)
HDL: 40 mg/dL (ref >39)
LDL Chol Calc (NIH): 77 mg/dL (ref 0–99)
Triglycerides: 116 mg/dL (ref 0–149)
VLDL Cholesterol Cal: 21 mg/dL (ref 5–40)

## 2019-08-28 LAB — CBC
Hematocrit: 37.4 % (ref 34.0–46.6)
Hemoglobin: 11.9 g/dL (ref 11.1–15.9)
MCH: 27.9 pg (ref 26.6–33.0)
MCHC: 31.8 g/dL (ref 31.5–35.7)
MCV: 88 fL (ref 79–97)
Platelets: 347 10*3/uL (ref 150–450)
RBC: 4.27 x10E6/uL (ref 3.77–5.28)
RDW: 13 % (ref 11.7–15.4)
WBC: 5.6 10*3/uL (ref 3.4–10.8)

## 2019-08-28 LAB — TSH: TSH: 2.71 u[IU]/mL (ref 0.450–4.500)

## 2019-08-28 LAB — H. PYLORI BREATH TEST: H pylori Breath Test: NEGATIVE

## 2019-09-24 ENCOUNTER — Encounter: Payer: Self-pay | Admitting: Nurse Practitioner

## 2019-09-24 ENCOUNTER — Ambulatory Visit: Payer: Self-pay | Attending: Nurse Practitioner | Admitting: Nurse Practitioner

## 2019-09-24 ENCOUNTER — Other Ambulatory Visit: Payer: Self-pay

## 2019-09-24 DIAGNOSIS — J4520 Mild intermittent asthma, uncomplicated: Secondary | ICD-10-CM

## 2019-09-24 DIAGNOSIS — F419 Anxiety disorder, unspecified: Secondary | ICD-10-CM

## 2019-09-24 DIAGNOSIS — E559 Vitamin D deficiency, unspecified: Secondary | ICD-10-CM

## 2019-09-24 DIAGNOSIS — K219 Gastro-esophageal reflux disease without esophagitis: Secondary | ICD-10-CM

## 2019-09-24 DIAGNOSIS — F329 Major depressive disorder, single episode, unspecified: Secondary | ICD-10-CM

## 2019-09-24 MED ORDER — SERTRALINE HCL 25 MG PO TABS: 25.0000 mg | ORAL_TABLET | Freq: Every day | ORAL | 1 refills | Status: DC

## 2019-09-24 MED ORDER — OMEPRAZOLE 20 MG PO CPDR: 20.0000 mg | DELAYED_RELEASE_CAPSULE | Freq: Every day | ORAL | 1 refills | Status: DC

## 2019-09-24 MED ORDER — PANTOPRAZOLE SODIUM 40 MG PO TBEC: 40.0000 mg | DELAYED_RELEASE_TABLET | Freq: Every day | ORAL | 1 refills | Status: DC

## 2019-09-24 MED ORDER — ALBUTEROL SULFATE HFA 108 (90 BASE) MCG/ACT IN AERS: 2.0000 | INHALATION_SPRAY | Freq: Four times a day (QID) | RESPIRATORY_TRACT | 0 refills | Status: DC | PRN

## 2019-09-24 MED ORDER — FLUTICASONE-SALMETEROL 250-50 MCG/DOSE IN AEPB: 1.0000 | INHALATION_SPRAY | Freq: Two times a day (BID) | RESPIRATORY_TRACT | 2 refills | Status: DC

## 2019-09-24 NOTE — Progress Notes (Signed)
Virtual Visit via Telephone Note Due to national recommendations of social distancing due to COVID 19, telehealth visit is felt to be most appropriate for this patient at this time.  I discussed the limitations, risks, security and privacy concerns of performing an evaluation and management service by telephone and the availability of in person appointments. I also discussed with the patient that there may be a patient responsible charge related to this service. The patient expressed understanding and agreed to proceed.    I connected with Karina Murphy on 09/24/19  at   3:50 PM EST  EDT by telephone and verified that I am speaking with the correct person using two identifiers.   Consent I discussed the limitations, risks, security and privacy concerns of performing an evaluation and management service by telephone and the availability of in person appointments. I also discussed with the patient that there may be a patient responsible charge related to this service. The patient expressed understanding and agreed to proceed.   Location of Patient: Private Residence   [facility] of Provider: [facility]    Persons participating in Telemedicine visit: Karina Denver FNP-BC YY [facility] CMA Gwenneth Murphy  Spanish Interpreter ID# [phone]   History of Present Illness: Telemedicine visit for: GERD f/u  has a past medical history of Anxiety, Arthritis, rheumatoid (HCC), Chronic headaches, GERD (gastroesophageal reflux disease), Hypertension, and Urticaria.   GERD Taking Omeprazole at this time. States symptoms of GERD have dramatically improved.She denies bilious reflux, choking on food, deep pressure at base of neck, fullness after meals, hematemesis, melena, midespigastric pain, nocturnal burning, regurgitation of undigested food and shortness of breath.  She has not lost weight. Medical therapy in the past has included antacids, H2 antagonists and proton  pump inhibitors.    Past Medical History:  Diagnosis Date  . Anxiety   . Arthritis, rheumatoid (HCC)   . Chronic headaches   . GERD (gastroesophageal reflux disease)   . Hypertension   . Urticaria     Past Surgical History:  Procedure Laterality Date  . NO PAST SURGERIES      Family History  Problem Relation Age of Onset  . Diabetes Mother   . Hypertension Mother   . Stomach cancer Neg Hx   . Colon cancer Neg Hx     Social History   Socioeconomic History  . Marital status: Single    Spouse name: Not on file  . Number of children: 2  . Years of education: Not on file  . Highest education level: Not on file  Occupational History  . Not on file  Tobacco Use  . Smoking status: Never Smoker  . Smokeless tobacco: Never Used  Substance and Sexual Activity  . Alcohol use: No  . Drug use: No  . Sexual activity: Not Currently    Birth control/protection: Condom  Other Topics Concern  . Not on file  Social History Narrative   Completed 2nd grade.    Social Determinants of Health   Financial Resource Strain:   . Difficulty of Paying Living Expenses: Not on file  Food Insecurity:   . Worried About Programme researcher, broadcasting/film/video in the Last Year: Not on file  . Ran Out of Food in the Last Year: Not on file  Transportation Needs:   . Lack of Transportation (Medical): Not on file  . Lack of Transportation (Non-Medical): Not on file  Physical Activity:   . Days of Exercise per Week: Not on file  .  Minutes of Exercise per Session: Not on file  Stress:   . Feeling of Stress : Not on file  Social Connections:   . Frequency of Communication with Friends and Family: Not on file  . Frequency of Social Gatherings with Friends and Family: Not on file  . Attends Religious Services: Not on file  . Active Member of Clubs or Organizations: Not on file  . Attends Archivist Meetings: Not on file  . Marital Status: Not on file     Observations/Objective: Awake, alert and  oriented x 3   ROS  Assessment and Plan: Karina Murphy was seen today for follow-up.  Diagnoses and all orders for this visit:  Gastroesophageal reflux disease without esophagitis -     omeprazole (PRILOSEC) 20 MG capsule; Take 1 capsule (20 mg total) by mouth daily. -     Discontinue: pantoprazole (PROTONIX) 40 MG tablet; Take 1 tablet (40 mg total) by mouth daily. INSTRUCTIONS: Avoid GERD Triggers: acidic, spicy or fried foods, caffeine, coffee, sodas,  alcohol and chocolate.  INSTRUCTIONS: Avoid GERD Triggers: acidic, spicy or fried foods, caffeine, coffee, sodas,  alcohol and chocolate.   Anxiety and depression -     sertraline (ZOLOFT) 25 MG tablet; Take 1 tablet (25 mg total) by mouth daily. Well controlled. Denies any current thoughts of self harm.   Mild intermittent asthma without complication -     Fluticasone-Salmeterol (ADVAIR) 250-50 MCG/DOSE AEPB; Inhale 1 puff into the lungs 2 (two) times daily. -     albuterol (VENTOLIN HFA) 108 (90 Base) MCG/ACT inhaler; Inhale 2 puffs into the lungs every 6 (six) hours as needed for wheezing or shortness of breath. DENIES COUGH WHEEZING OR SHORTNESS OF BREATH   Vitamin D deficiency disease Take OTC VIT D 1000 units daily     Follow Up Instructions Return if symptoms worsen or fail to improve.     I discussed the assessment and treatment plan with the patient. The patient was provided an opportunity to ask questions and all were answered. The patient agreed with the plan and demonstrated an understanding of the instructions.   The patient was advised to call back or seek an in-person evaluation if the symptoms worsen or if the condition fails to improve as anticipated.  I provided 13 minutes of non-face-to-face time during this encounter including median intraservice time, reviewing previous notes, labs, imaging, medications and explaining diagnosis and management.  Gildardo Pounds, FNP-BC

## 2019-09-25 MED FILL — PANTOPRAZOLE SOD DR 40 MG T: 40 | 30 days supply | Qty: 30 | Fill #0

## 2019-09-25 MED FILL — ?SERTRALINE HCL 25MG TABS: 25 | 30 days supply | Qty: 30 | Fill #0

## 2019-09-25 MED FILL — FLUTICASONE-SALMETEROL 250-: 250-50 | 30 days supply | Qty: 60 | Fill #0

## 2019-09-25 MED FILL — !VENTOLIN HFA INHALER: 108 (90 BAS | 25 days supply | Qty: 18 | Fill #0

## 2019-09-27 MED FILL — VIT D2 1.25 MG (50,000 UNIT: 1.25 MG | 84 days supply | Qty: 12 | Fill #0

## 2020-02-25 ENCOUNTER — Other Ambulatory Visit: Payer: Self-pay

## 2020-02-25 ENCOUNTER — Ambulatory Visit: Payer: Self-pay

## 2020-03-14 ENCOUNTER — Encounter: Payer: Self-pay | Admitting: Nurse Practitioner

## 2020-03-14 ENCOUNTER — Ambulatory Visit: Payer: Self-pay | Attending: Nurse Practitioner | Admitting: Nurse Practitioner

## 2020-03-14 DIAGNOSIS — K219 Gastro-esophageal reflux disease without esophagitis: Secondary | ICD-10-CM

## 2020-03-14 DIAGNOSIS — F32A Depression, unspecified: Secondary | ICD-10-CM

## 2020-03-14 DIAGNOSIS — F419 Anxiety disorder, unspecified: Secondary | ICD-10-CM

## 2020-03-14 DIAGNOSIS — M0579 Rheumatoid arthritis with rheumatoid factor of multiple sites without organ or systems involvement: Secondary | ICD-10-CM

## 2020-03-14 DIAGNOSIS — F329 Major depressive disorder, single episode, unspecified: Secondary | ICD-10-CM

## 2020-03-14 MED ORDER — PREDNISONE 10 MG PO TABS: 10.0000 mg | ORAL_TABLET | Freq: Every day | ORAL | 0 refills | Status: AC

## 2020-03-14 MED ORDER — OMEPRAZOLE 20 MG PO CPDR: 20.0000 mg | DELAYED_RELEASE_CAPSULE | Freq: Every day | ORAL | 1 refills | Status: DC

## 2020-03-14 MED ORDER — SERTRALINE HCL 25 MG PO TABS: 25.0000 mg | ORAL_TABLET | Freq: Every day | ORAL | 1 refills | Status: DC

## 2020-03-14 NOTE — Progress Notes (Signed)
Virtual Visit via Telephone Note Due to national recommendations of social distancing due to COVID 19, telehealth visit is felt to be most appropriate for this patient at this time.  I discussed the limitations, risks, security and privacy concerns of performing an evaluation and management service by telephone and the availability of in person appointments. I also discussed with the patient that there may be a patient responsible charge related to this service. The patient expressed understanding and agreed to proceed.    I connected with Karina Murphy on 03/14/20  at   3:50 PM EDT  EDT by telephone and verified that I am speaking with the correct person using two identifiers.   Consent I discussed the limitations, risks, security and privacy concerns of performing an evaluation and management service by telephone and the availability of in person appointments. I also discussed with the patient that there may be a patient responsible charge related to this service. The patient expressed understanding and agreed to proceed.   Location of Patient: Private  Residence    [facility] of Provider[facility]    Persons participating in Telemedicine visit: [name] FNP-BC YY [facility]ien CMA Karina Murphy    History of Present Illness: Telemedicine visit for: Rheumatoid Arthritis  She was referred to rheumatology last year however her financial assistance had expired and the rheumatologist declined the referral. Will place another referral as she has now reapply for the financial assistance program which does not expire until the end of the year. She has a positive rheumatoid factor and CRP.  Endorses multiple areas of joint pain with worse pain occurring in the bilateral hands.   Past Medical History:  Diagnosis Date  . Anxiety   . Arthritis, rheumatoid (HCC)   . Chronic headaches   . GERD (gastroesophageal reflux disease)   . Hypertension   .  Urticaria     Past Surgical History:  Procedure Laterality Date  . NO PAST SURGERIES      Family History  Problem Relation Age of Onset  . Diabetes Mother   . Hypertension Mother   . Stomach cancer Neg Hx   . Colon cancer Neg Hx     Social History   Socioeconomic History  . Marital status: Single    Spouse name: Not on file  . Number of children: 2  . Years of education: Not on file  . Highest education level: Not on file  Occupational History  . Not on file  Tobacco Use  . Smoking status: Never Smoker  . Smokeless tobacco: Never Used  Vaping Use  . Vaping Use: Never used  Substance and Sexual Activity  . Alcohol use: No  . Drug use: No  . Sexual activity: Not Currently    Birth control/protection: Condom  Other Topics Concern  . Not on file  Social History Narrative   Completed 2nd grade.    Social Determinants of Health   Financial Resource Strain:   . Difficulty of Paying Living Expenses:   Food Insecurity:   . Worried About Programme researcher, broadcasting/film/video in the Last Year:   . Barista in the Last Year:   Transportation Needs:   . Freight forwarder (Medical):   Marland Kitchen Lack of Transportation (Non-Medical):   Physical Activity:   . Days of Exercise per Week:   . Minutes of Exercise per Session:   Stress:   . Feeling of Stress :   Social Connections:   . Frequency  of Communication with Friends and Family:   . Frequency of Social Gatherings with Friends and Family:   . Attends Religious Services:   . Active Member of Clubs or Organizations:   . Attends Banker Meetings:   Marland Kitchen Marital Status:      Observations/Objective: Awake, alert and oriented x 3   Review of Systems  Constitutional: Negative for fever, malaise/fatigue and weight loss.  HENT: Negative.  Negative for nosebleeds.   Eyes: Negative.  Negative for blurred vision, double vision and photophobia.  Respiratory: Negative.  Negative for cough and shortness of breath.    Cardiovascular: Negative.  Negative for chest pain, palpitations and leg swelling.  Gastrointestinal: Positive for heartburn. Negative for nausea and vomiting.  Musculoskeletal: Positive for joint pain. Negative for myalgias.  Neurological: Negative.  Negative for dizziness, focal weakness, seizures and headaches.  Psychiatric/Behavioral: Positive for depression. Negative for suicidal ideas. The patient is nervous/anxious.     Assessment and Plan: Karina Murphy was seen today for hand pain.  Diagnoses and all orders for this visit:  Rheumatoid arthritis involving multiple sites with positive rheumatoid factor (HCC) -     Ambulatory referral to Rheumatology -     predniSONE (DELTASONE) 10 MG tablet; Take 1 tablet (10 mg total) by mouth daily with breakfast for 14 days.  Anxiety and depression -     sertraline (ZOLOFT) 25 MG tablet; Take 1 tablet (25 mg total) by mouth daily. Well controlled.   Gastroesophageal reflux disease without esophagitis -     omeprazole (PRILOSEC) 20 MG capsule; Take 1 capsule (20 mg total) by mouth daily. INSTRUCTIONS: Avoid GERD Triggers: acidic, spicy or fried foods, caffeine, coffee, sodas,  alcohol and chocolate.    Follow Up Instructions Return in about 3 months (around 06/14/2020).     I discussed the assessment and treatment plan with the patient. The patient was provided an opportunity to ask questions and all were answered. The patient agreed with the plan and demonstrated an understanding of the instructions.   The patient was advised to call back or seek an in-person evaluation if the symptoms worsen or if the condition fails to improve as anticipated.  I provided 16 minutes of non-face-to-face time during this encounter including median intraservice time, reviewing previous notes, labs, imaging, medications and explaining diagnosis and management.  Claiborne Rigg, FNP-BC

## 2020-03-18 MED FILL — ?SERTRALINE HCL 25MG TABS: 25 | 30 days supply | Qty: 30 | Fill #0

## 2020-03-18 MED FILL — ?OMEPRAZOLE 20 MG CPDR: 20 | 30 days supply | Qty: 30 | Fill #0

## 2020-03-18 MED FILL — ?PREDINSONE 10MG TABLETS: 10 | 14 days supply | Qty: 14 | Fill #0

## 2020-03-19 ENCOUNTER — Other Ambulatory Visit: Payer: Self-pay

## 2020-03-19 ENCOUNTER — Ambulatory Visit: Payer: Self-pay | Attending: Nurse Practitioner

## 2020-03-19 DIAGNOSIS — J4520 Mild intermittent asthma, uncomplicated: Secondary | ICD-10-CM

## 2020-03-19 DIAGNOSIS — R7303 Prediabetes: Secondary | ICD-10-CM

## 2020-03-19 DIAGNOSIS — E559 Vitamin D deficiency, unspecified: Secondary | ICD-10-CM

## 2020-03-19 DIAGNOSIS — Z1159 Encounter for screening for other viral diseases: Secondary | ICD-10-CM

## 2020-03-19 DIAGNOSIS — Z130 Encounter for screening for diseases of the blood and blood-forming organs and certain disorders involving the immune mechanism: Secondary | ICD-10-CM

## 2020-03-20 LAB — CBC
Hematocrit: 37.3 % (ref 34.0–46.6)
Hemoglobin: 11.7 g/dL (ref 11.1–15.9)
MCH: 27.5 pg (ref 26.6–33.0)
MCHC: 31.4 g/dL — ABNORMAL LOW (ref 31.5–35.7)
MCV: 88 fL (ref 79–97)
Platelets: 303 10*3/uL (ref 150–450)
RBC: 4.25 x10E6/uL (ref 3.77–5.28)
RDW: 12.7 % (ref 11.7–15.4)
WBC: 6.3 10*3/uL (ref 3.4–10.8)

## 2020-03-20 LAB — CMP14+EGFR
ALT: 10 IU/L (ref 0–32)
AST: 12 IU/L (ref 0–40)
Albumin/Globulin Ratio: 1.4 (ref 1.2–2.2)
Albumin: 4.2 g/dL (ref 3.8–4.8)
Alkaline Phosphatase: 71 IU/L (ref 48–121)
BUN/Creatinine Ratio: 28 — ABNORMAL HIGH (ref 9–23)
BUN: 19 mg/dL (ref 6–20)
Bilirubin Total: 0.5 mg/dL (ref 0.0–1.2)
CO2: 22 mmol/L (ref 20–29)
Calcium: 9.3 mg/dL (ref 8.7–10.2)
Chloride: 104 mmol/L (ref 96–106)
Creatinine, Ser: 0.69 mg/dL (ref 0.57–1.00)
GFR calc Af Amer: 129 mL/min/{1.73_m2} (ref >59)
GFR calc non Af Amer: 112 mL/min/{1.73_m2} (ref >59)
Globulin, Total: 3 g/dL (ref 1.5–4.5)
Glucose: 84 mg/dL (ref 65–99)
Potassium: 4.1 mmol/L (ref 3.5–5.2)
Sodium: 139 mmol/L (ref 134–144)
Total Protein: 7.2 g/dL (ref 6.0–8.5)

## 2020-03-20 LAB — VITAMIN D 25 HYDROXY (VIT D DEFICIENCY, FRACTURES): Vit D, 25-Hydroxy: 14.6 ng/mL — ABNORMAL LOW (ref 30.0–100.0)

## 2020-03-20 LAB — HEMOGLOBIN A1C
Est. average glucose Bld gHb Est-mCnc: 114 mg/dL
Hgb A1c MFr Bld: 5.6 % (ref 4.8–5.6)

## 2020-03-20 LAB — HEPATITIS C ANTIBODY: Hep C Virus Ab: <0.1 s/co ratio (ref 0.0–0.9)

## 2020-06-15 ENCOUNTER — Ambulatory Visit (HOSPITAL_COMMUNITY)
Admission: EM | Admit: 2020-06-15 | Discharge: 2020-06-15 | Disposition: A | Discharge status: Home / Self Care | Payer: Self-pay | Attending: Family Medicine | Admitting: Family Medicine

## 2020-06-15 ENCOUNTER — Other Ambulatory Visit: Payer: Self-pay

## 2020-06-15 ENCOUNTER — Encounter (HOSPITAL_COMMUNITY): Payer: Self-pay | Admitting: Emergency Medicine

## 2020-06-15 DIAGNOSIS — M545 Low back pain, unspecified: Secondary | ICD-10-CM

## 2020-06-15 DIAGNOSIS — Z3202 Encounter for pregnancy test, result negative: Secondary | ICD-10-CM

## 2020-06-15 LAB — POCT URINALYSIS DIPSTICK, ED / UC
Bilirubin Urine: NEGATIVE
Glucose, UA: NEGATIVE mg/dL
Ketones, ur: NEGATIVE mg/dL
Leukocytes,Ua: NEGATIVE
Nitrite: NEGATIVE
Protein, ur: NEGATIVE mg/dL
Specific Gravity, Urine: 1.02 (ref 1.005–1.030)
Urobilinogen, UA: 1 mg/dL (ref 0.0–1.0)
pH: 8.5 — ABNORMAL HIGH (ref 5.0–8.0)

## 2020-06-15 LAB — POC URINE PREG, ED: Preg Test, Ur: NEGATIVE

## 2020-06-15 MED ORDER — KETOROLAC TROMETHAMINE 30 MG/ML IJ SOLN
30.0000 mg | Freq: Once | INTRAMUSCULAR | Status: AC
  Administered 2020-06-15: 30 mg via INTRAMUSCULAR

## 2020-06-15 MED ORDER — CYCLOBENZAPRINE HCL 5 MG PO TABS: 5.0000 mg | ORAL_TABLET | Freq: Two times a day (BID) | ORAL | 0 refills | Status: DC | PRN

## 2020-06-15 MED ORDER — PREDNISONE 10 MG PO TABS: 20.0000 mg | ORAL_TABLET | Freq: Every day | ORAL | 0 refills | Status: DC

## 2020-06-15 MED ORDER — NAPROXEN 500 MG PO TABS: 500.0000 mg | ORAL_TABLET | Freq: Two times a day (BID) | ORAL | 0 refills | Status: DC

## 2020-06-15 NOTE — Discharge Instructions (Signed)
We gave you an injection of toradol Begin prednisone taper for the next 6 days- 6,5,4,3,2,1- toma con comida y en la manana You may use flexeril as needed to help with pain. This is a muscle relaxer and causes sedation- please use only at bedtime or when you will be home and not have to drive/work Naprosyn si necesita para dolor despues de termine el curso de prednisone Regrese si no mejoran

## 2020-06-15 NOTE — ED Triage Notes (Signed)
Pt presents with low back pain xs 3 days. Denies urinary symptoms, fall or injury. Pt states had to carry heavy box on Thursday and back pain started next day. States is having lower abdominal pain and started bleeding on Friday.

## 2020-06-15 NOTE — ED Provider Notes (Signed)
[facility]    CSN: [id number] Arrival date & time: 06/15/20  1641      History   Chief Complaint Chief Complaint  Patient presents with  . Back Pain    HPI Karina Murphy is a 39 y.o. female presenting today for evaluation of back pain. Reports back pain since Friday morning. Worsened throughout the day. Denies injury/fall. Did lift 40 lb box day before onset. Denies radiation into leg. Denies urinary symptoms, loss of bowel/bladder control. Denies numbness or tingling. Also has had some lower abdominal pain and developed menstrual cycle approximately 15 days early. Is not on birth control. Using ibuprofen without relief.     HPI  Past Medical History:  Diagnosis Date  . Anxiety   . Arthritis, rheumatoid (HCC)   . Chronic headaches   . GERD (gastroesophageal reflux disease)   . Hypertension   . Urticaria     Patient Active Problem List   Diagnosis Date Noted  . 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  . Weakness 11/23/2016  . Breast pain, left 09/17/2016  . Abdominal pain, epigastric 07/27/2016  . Rheumatoid arthritis (HCC) 02/12/2014  . Obese 12/26/2013    Past Surgical History:  Procedure Laterality Date  . NO PAST SURGERIES      OB History   No obstetric history on file.      Home Medications    Prior to Admission medications   Medication Sig Start Date End Date Taking? Authorizing Provider  albuterol (VENTOLIN HFA) 108 (90 Base) MCG/ACT inhaler Inhale 2 puffs into the lungs every 6 (six) hours as needed for wheezing or shortness of breath. 09/24/19   Claiborne Rigg, NP  cyclobenzaprine (FLEXERIL) 5 MG tablet Take 1-2 tablets (5-10 mg total) by mouth 2 (two) times daily as needed for muscle spasms. 06/15/20   Diedre Maclellan C, PA-C  Fluticasone-Salmeterol (ADVAIR) 250-50 MCG/DOSE AEPB Inhale 1 puff into the lungs 2 (two) times daily. 09/24/19   Claiborne Rigg, NP  naproxen (NAPROSYN) 500 MG tablet Take 1 tablet (500 mg total) by  mouth 2 (two) times daily. 06/15/20   Jaedan Huttner C, PA-C  omeprazole (PRILOSEC) 20 MG capsule Take 1 capsule (20 mg total) by mouth daily. 03/14/20 06/12/20  Claiborne Rigg, NP  predniSONE (DELTASONE) 10 MG tablet Take 2 tablets (20 mg total) by mouth daily. 06/15/20   Amneet Cendejas C, PA-C  sertraline (ZOLOFT) 25 MG tablet Take 1 tablet (25 mg total) by mouth daily. 03/14/20 06/12/20  Claiborne Rigg, NP  Vitamin D, Ergocalciferol, (DRISDOL) 1.25 MG (50000 UT) CAPS capsule Take 1 capsule (50,000 Units total) by mouth every 7 (seven) days. Patient not taking: Reported on 03/14/2020 01/19/19   Claiborne Rigg, NP    Family History Family History  Problem Relation Age of Onset  . Diabetes Mother   . Hypertension Mother   . Stomach cancer Neg Hx   . Colon cancer Neg Hx     Social History Social History   Tobacco Use  . Smoking status: Never Smoker  . Smokeless tobacco: Never Used  Vaping Use  . Vaping Use: Never used  Substance Use Topics  . Alcohol use: No  . Drug use: No     Allergies   Eggs or egg-derived products and Pineapple   Review of Systems Review of Systems  Constitutional: Negative for fever.  Respiratory: Negative for shortness of breath.   Cardiovascular: Negative for chest pain.  Gastrointestinal: Positive for abdominal pain. Negative for  diarrhea, nausea and vomiting.  Genitourinary: Positive for vaginal bleeding. Negative for dysuria, flank pain, genital sores, hematuria, menstrual problem, vaginal discharge and vaginal pain.  Musculoskeletal: Positive for back pain and myalgias.  Skin: Negative for rash.  Neurological: Negative for dizziness, light-headedness and headaches.      Physical Exam Triage Vital Signs ED Triage Vitals  Enc Vitals Group     BP 06/15/20 1815 (!) 100/42     Pulse Rate 06/15/20 1815 79     Resp 06/15/20 1815 18     Temp 06/15/20 1815 98.8 F (37.1 C)     Temp Source 06/15/20 1815 Oral     SpO2 06/15/20 1815 100 %      Weight --      Height --      Head Circumference --      Peak Flow --      Pain Score 06/15/20 1813 10     Pain Loc --      Pain Edu? --      Excl. in GC? --    No data found.  Updated Vital Signs BP (!) 100/42 (BP Location: Right Arm)   Pulse 79   Temp 98.8 F (37.1 C) (Oral)   Resp 18   LMP 06/13/2020   SpO2 100%   Visual Acuity Right Eye Distance:   Left Eye Distance:   Bilateral Distance:    Right Eye Near:   Left Eye Near:    Bilateral Near:     Physical Exam Vitals and nursing note reviewed.  Constitutional:      Appearance: She is well-developed.     Comments: No acute distress  HENT:     Head: Normocephalic and atraumatic.     Nose: Nose normal.  Eyes:     Conjunctiva/sclera: Conjunctivae normal.  Cardiovascular:     Rate and Rhythm: Normal rate and regular rhythm.  Pulmonary:     Effort: Pulmonary effort is normal. No respiratory distress.  Abdominal:     General: There is no distension.  Musculoskeletal:        General: Normal range of motion.     Cervical back: Neck supple.     Comments: nontender to palpation of lumbar spine midline, tenderness diffusely throughout left lumbar area Strength in hips and knees 5/5 bilaterally, patellar reflex 2+, negative SLR  Skin:    General: Skin is warm and dry.  Neurological:     Mental Status: She is alert and oriented to person, place, and time.      UC Treatments / Results  Labs (all labs ordered are listed, but only abnormal results are displayed) Labs Reviewed  POCT URINALYSIS DIPSTICK, ED / UC - Abnormal; Notable for the following components:      Result Value   Hgb urine dipstick TRACE (*)    pH 8.5 (*)    All other components within normal limits  POC URINE PREG, ED    EKG   Radiology No results found.  Procedures Procedures (including critical care time)  Medications Ordered in UC Medications  ketorolac (TORADOL) 30 MG/ML injection 30 mg (30 mg Intramuscular Given 06/15/20 1914)      Initial Impression / Assessment and Plan / UC Course  I have reviewed the triage vital signs and the nursing notes.  Pertinent labs & imaging results that were available during my care of the patient were reviewed by me and considered in my medical decision making (see chart for details).  No mechanism of injury, do not suspect acute bony abnormality, likely lumbar strain from heavy lifting. Using nsaids without relief. Treating with prednisone course and muscle relaxers, toradol prior to discharge. activity modification. UA negative for leuks and nitrites. Negative pregnancy.   Discussed strict return precautions. Patient verbalized understanding and is agreeable with plan.  Final Clinical Impressions(s) / UC Diagnoses   Final diagnoses:  Acute left-sided low back pain without sciatica     Discharge Instructions     We gave you an injection of toradol Begin prednisone taper for the next 6 days- 6,5,4,3,2,1- toma con comida y en la manana You may use flexeril as needed to help with pain. This is a muscle relaxer and causes sedation- please use only at bedtime or when you will be home and not have to drive/work Naprosyn si necesita para dolor despues de termine el curso de prednisone Regrese si no mejoran    ED Prescriptions    Medication Sig Dispense Auth. Provider   predniSONE (DELTASONE) 10 MG tablet Take 2 tablets (20 mg total) by mouth daily. 15 tablet Eshika Reckart C, PA-C   cyclobenzaprine (FLEXERIL) 5 MG tablet Take 1-2 tablets (5-10 mg total) by mouth 2 (two) times daily as needed for muscle spasms. 24 tablet Barbarann Kelly C, PA-C   naproxen (NAPROSYN) 500 MG tablet Take 1 tablet (500 mg total) by mouth 2 (two) times daily. 30 tablet Carlin Mamone, [employer] C, PA-C     PDMP not reviewed this encounter.   Lew Dawes, [place] 06/16/20 1840

## 2020-07-07 ENCOUNTER — Ambulatory Visit (HOSPITAL_COMMUNITY)
Admission: EM | Admit: 2020-07-07 | Discharge: 2020-07-07 | Disposition: A | Discharge status: Home / Self Care | Payer: Self-pay | Attending: Family Medicine | Admitting: Family Medicine

## 2020-07-07 ENCOUNTER — Ambulatory Visit: Payer: Self-pay | Admitting: *Deleted

## 2020-07-07 ENCOUNTER — Encounter (HOSPITAL_COMMUNITY): Payer: Self-pay | Admitting: Emergency Medicine

## 2020-07-07 ENCOUNTER — Other Ambulatory Visit: Payer: Self-pay

## 2020-07-07 DIAGNOSIS — R42 Dizziness and giddiness: Secondary | ICD-10-CM | POA: Insufficient documentation

## 2020-07-07 DIAGNOSIS — R1011 Right upper quadrant pain: Secondary | ICD-10-CM | POA: Insufficient documentation

## 2020-07-07 LAB — COMPREHENSIVE METABOLIC PANEL
ALT: 11 U/L (ref 0–44)
AST: 14 U/L — ABNORMAL LOW (ref 15–41)
Albumin: 4.1 g/dL (ref 3.5–5.0)
Alkaline Phosphatase: 58 U/L (ref 38–126)
Anion gap: 10 (ref 5–15)
BUN: 13 mg/dL (ref 6–20)
CO2: 25 mmol/L (ref 22–32)
Calcium: 9.6 mg/dL (ref 8.9–10.3)
Chloride: 105 mmol/L (ref 98–111)
Creatinine, Ser: 0.79 mg/dL (ref 0.44–1.00)
GFR, Estimated: >60 mL/min (ref >60)
Glucose, Bld: 94 mg/dL (ref 70–99)
Potassium: 3.6 mmol/L (ref 3.5–5.1)
Sodium: 140 mmol/L (ref 135–145)
Total Bilirubin: 0.6 mg/dL (ref 0.3–1.2)
Total Protein: 8.1 g/dL (ref 6.5–8.1)

## 2020-07-07 LAB — CBC
HCT: 41.3 % (ref 36.0–46.0)
Hemoglobin: 13.1 g/dL (ref 12.0–15.0)
MCH: 28.1 pg (ref 26.0–34.0)
MCHC: 31.7 g/dL (ref 30.0–36.0)
MCV: 88.6 fL (ref 80.0–100.0)
Platelets: 340 10*3/uL (ref 150–400)
RBC: 4.66 MIL/uL (ref 3.87–5.11)
RDW: 13.4 % (ref 11.5–15.5)
WBC: 4.8 10*3/uL (ref 4.0–10.5)
nRBC: 0 % (ref 0.0–0.2)

## 2020-07-07 MED ORDER — ONDANSETRON HCL 4 MG PO TABS: 4.0000 mg | ORAL_TABLET | Freq: Four times a day (QID) | ORAL | 0 refills | Status: DC

## 2020-07-07 MED ORDER — TRAMADOL-ACETAMINOPHEN 37.5-325 MG PO TABS: 1.0000 | ORAL_TABLET | Freq: Four times a day (QID) | ORAL | 0 refills | Status: DC | PRN

## 2020-07-07 NOTE — Discharge Instructions (Addendum)
Take Zofran as needed for nausea and vomiting Take Ultracet as needed for abdominal pain Drink plenty of fluids.  Bland diet.  Limit fatty or spicy foods Check MyChart tomorrow for your test results.  You will be called if your test results are positive. You will need to follow-up for an ultrasound of your abdomen.  This can be scheduled by your primary care doctor, [name]

## 2020-07-07 NOTE — Telephone Encounter (Signed)
Assisted by [facility], Interpreter # [phone]; pt called with complaints of dizziness, vomiting, and right abdominal pain; she says these have been occurring since 07/05/20; her abdominal pain is intermittent and rated 8 out of 10; her dizziness started on 07/05/20 with ongoing episodes; she was dizzy on 07/07/20 prior to vomiting  she has vomited once on 07/07/20 x 1 but has ongoing nausea; recommendations made per nurse triage protocol; she verbalized understanding; the pr sees Zelda [place], [employer]; will route to office for notification of encounter.  Reason for Disposition . [1] SEVERE pain (e.g., excruciating) AND [2] present > 1 hour  Answer Assessment - Initial Assessment Questions 1. LOCATION: "Where does it hurt?"      Right middle 2. RADIATION: "Does the pain shoot anywhere else?" (e.g., chest, back)     Yes, chest 3. ONSET: "When did the pain begin?" (e.g., minutes, hours or days ago)      07/05/20 4. SUDDEN: "Gradual or sudden onset?"     Gradually increasing 5. PATTERN "Does the pain come and go, or is it constant?"    - If constant: "Is it getting better, staying the same, or worsening?"      (Note: Constant means the pain never goes away completely; most serious pain is constant and it progresses)     - If intermittent: "How long does it last?" "Do you have pain now?"     (Note: Intermittent means the pain goes away completely between bouts)     intermittent 6. SEVERITY: "How bad is the pain?"  (e.g., Scale 1-10; mild, moderate, or severe)   - MILD (1-3): doesn't interfere with normal activities, abdomen soft and not tender to touch    - MODERATE (4-7): interferes with normal activities or awakens from sleep, tender to touch    - SEVERE (8-10): excruciating pain, doubled over, unable to do any normal activities      9 out of 10 7. RECURRENT SYMPTOM: "Have you ever had this type of stomach pain before?" If Yes, ask: "When was the last time?" and "What happened  that time?"      Yet but in pit of stomach 8. CAUSE: "What do you think is causing the stomach pain?"     Not sure 9. RELIEVING/AGGRAVATING FACTORS: "What makes it better or worse?" (e.g., movement, antacids, bowel movement)    Worse after eating 10. OTHER SYMPTOMS: "Has there been any vomiting, diarrhea, constipation, or urine problems?"     Dizziness, vomiting 11. PREGNANCY: "Is there any chance you are pregnant?" "When was your last menstrual period?"       Not sure; LMP 06/11/20  Protocols used: ABDOMINAL PAIN - [facility]

## 2020-07-07 NOTE — ED Triage Notes (Signed)
Pts daughter is interpreting for patient: Pt has had nausea, dizziness, and RUQ abd pain onset Saturday afternoon. She states her hands also feel like they are going numb onset yesterday.

## 2020-07-08 NOTE — Progress Notes (Signed)
Patient has suspected gallbladder disease.  Labs normal.  Needs to call PCP to see about gallbladder ultrasound

## 2020-07-08 NOTE — ED Provider Notes (Signed)
[facility]    CSN: [id number] Arrival date & time: 07/07/20  1715      History   Chief Complaint Chief Complaint  Patient presents with  . Abdominal Pain  . Nausea  . Dizziness    HPI Karina Murphy is a 39 y.o. female.   HPI   Patient states she has not felt well since Saturday.  She has right upper quadrant abdominal pain.  Made worse with eating.  This is accompanied by nausea.  A couple episodes of vomiting.  She feels somewhat weak and dizzy.  She states she is having some sweaty palms and at times chills.  She has not had fever.  She has not been vaccinated against Covid.  She has never had abdominal surgery.  She does not have any heartburn or reflux pain at this time, she takes omeprazole 20 mg daily. Denies history of NSAID Denies alcohol ingestion  Past Medical History:  Diagnosis Date  . Anxiety   . Arthritis, rheumatoid (HCC)   . Chronic headaches   . GERD (gastroesophageal reflux disease)   . Hypertension   . Urticaria     Patient Active Problem List   Diagnosis Date Noted  . 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  . Weakness 11/23/2016  . Breast pain, left 09/17/2016  . Abdominal pain, epigastric 07/27/2016  . Rheumatoid arthritis (HCC) 02/12/2014  . Obese 12/26/2013    Past Surgical History:  Procedure Laterality Date  . NO PAST SURGERIES      OB History   No obstetric history on file.      Home Medications    Prior to Admission medications   Medication Sig Start Date End Date Taking? Authorizing Provider  naproxen (NAPROSYN) 500 MG tablet Take 1 tablet (500 mg total) by mouth 2 (two) times daily. 06/15/20  Yes Wieters, Hallie C, PA-C  albuterol (VENTOLIN HFA) 108 (90 Base) MCG/ACT inhaler Inhale 2 puffs into the lungs every 6 (six) hours as needed for wheezing or shortness of breath. 09/24/19   Claiborne Rigg, NP  cyclobenzaprine (FLEXERIL) 5 MG tablet Take 1-2 tablets (5-10 mg total) by mouth 2 (two) times daily  as needed for muscle spasms. 06/15/20   Wieters, Hallie C, PA-C  Fluticasone-Salmeterol (ADVAIR) 250-50 MCG/DOSE AEPB Inhale 1 puff into the lungs 2 (two) times daily. 09/24/19   Claiborne Rigg, NP  omeprazole (PRILOSEC) 20 MG capsule Take 1 capsule (20 mg total) by mouth daily. 03/14/20 06/12/20  Claiborne Rigg, NP  ondansetron (ZOFRAN) 4 MG tablet Take 1-2 tablets (4-8 mg total) by mouth every 6 (six) hours. As needed nausea 07/07/20   Eustace Moore, MD  predniSONE (DELTASONE) 10 MG tablet Take 2 tablets (20 mg total) by mouth daily. 06/15/20   Wieters, Hallie C, PA-C  sertraline (ZOLOFT) 25 MG tablet Take 1 tablet (25 mg total) by mouth daily. 03/14/20 06/12/20  Claiborne Rigg, NP  traMADol-acetaminophen (ULTRACET) 37.5-325 MG tablet Take 1-2 tablets by mouth every 6 (six) hours as needed. 07/07/20   Eustace Moore, MD  Vitamin D, Ergocalciferol, (DRISDOL) 1.25 MG (50000 UT) CAPS capsule Take 1 capsule (50,000 Units total) by mouth every 7 (seven) days. Patient not taking: Reported on 03/14/2020 01/19/19   Claiborne Rigg, NP    Family History Family History  Problem Relation Age of Onset  . Diabetes Mother   . Hypertension Mother   . Stomach cancer Neg Hx   . Colon cancer Neg Hx  Social History Social History   Tobacco Use  . Smoking status: Never Smoker  . Smokeless tobacco: Never Used  Vaping Use  . Vaping Use: Never used  Substance Use Topics  . Alcohol use: No  . Drug use: No     Allergies   Eggs or egg-derived products and Pineapple   Review of Systems Review of Systems See HPI  Physical Exam Triage Vital Signs ED Triage Vitals  Enc Vitals Group     BP 07/07/20 1814 139/71     Pulse Rate 07/07/20 1814 80     Resp -- 16-YSN     Temp 07/07/20 1814 97.6 F (36.4 C)     Temp Source 07/07/20 1814 Oral     SpO2 07/07/20 1814 100 %     Weight --      Height --      Head Circumference --      Peak Flow --      Pain Score 07/07/20 1816 6     Pain Loc --       Pain Edu? --      Excl. in GC? --    No data found.  Updated Vital Signs BP 139/71 (BP Location: Left Arm)   Pulse 80   Temp 97.6 F (36.4 C) (Oral)   LMP 06/13/2020   SpO2 100%       Physical Exam Constitutional:      General: She is not in acute distress.    Appearance: She is well-developed.     Comments: No acute distress.  Mildly overweight  HENT:     Head: Normocephalic and atraumatic.     Mouth/Throat:     Comments: Mask is in place Eyes:     Conjunctiva/sclera: Conjunctivae normal.     Pupils: Pupils are equal, round, and reactive to light.  Cardiovascular:     Rate and Rhythm: Normal rate and regular rhythm.     Heart sounds: Normal heart sounds.  Pulmonary:     Effort: Pulmonary effort is normal. No respiratory distress.     Breath sounds: Normal breath sounds.  Abdominal:     General: Bowel sounds are normal. There is no distension.     Palpations: Abdomen is soft.     Tenderness: There is abdominal tenderness.     Comments: Patient is tenderness palpation in the right upper quadrant.  No hepatomegaly.  No guarding or rebound.  Musculoskeletal:        General: Normal range of motion.     Cervical back: Normal range of motion.  Skin:    General: Skin is warm and dry.  Neurological:     Mental Status: She is alert.  Psychiatric:        Mood and Affect: Mood normal.        Behavior: Behavior normal.      UC Treatments / Results  Labs (all labs ordered are listed, but only abnormal results are displayed) Labs Reviewed  COMPREHENSIVE METABOLIC PANEL - Abnormal; Notable for the following components:      Result Value   AST 14 (*)    All other components within normal limits  CBC    EKG   Radiology No results found.  Procedures Procedures (including critical care time)  Medications Ordered in UC Medications - No data to display  Initial Impression / Assessment and Plan / UC Course  I have reviewed the triage vital signs and the  nursing notes.  Pertinent labs & imaging  results that were available during my care of the patient were reviewed by me and considered in my medical decision making (see chart for details).     Reviewed the patient's symptoms are most likely due to gallbladder disease.  We will do a CBC and metabolic panel tonight.  Her results will not be available till tomorrow.  She will be called with any abnormal results.  She knows to call her primary care doctor tomorrow to see about getting a abdominal ultrasound.  Is given medication for pain and nausea. Final Clinical Impressions(s) / UC Diagnoses   Final diagnoses:  Postprandial RUQ pain  Dizziness     Discharge Instructions     Take Zofran as needed for nausea and vomiting Take Ultracet as needed for abdominal pain Drink plenty of fluids.  Bland diet.  Limit fatty or spicy foods Check MyChart tomorrow for your test results.  You will be called if your test results are positive. You will need to follow-up for an ultrasound of your abdomen.  This can be scheduled by your primary care doctor, [name]   ED Prescriptions    Medication Sig Dispense Auth. Provider   traMADol-acetaminophen (ULTRACET) 37.5-325 MG tablet Take 1-2 tablets by mouth every 6 (six) hours as needed. 20 tablet Eustace Moore, MD   ondansetron (ZOFRAN) 4 MG tablet Take 1-2 tablets (4-8 mg total) by mouth every 6 (six) hours. As needed nausea 12 tablet Eustace Moore, MD     I have reviewed the PDMP during this encounter.   Eustace Moore, MD 07/08/20 [phone]

## 2020-07-12 NOTE — Telephone Encounter (Signed)
Needs UC if unable to be seen in office

## 2020-07-14 HISTORY — PX: CHOLECYSTECTOMY: SHX55

## 2020-07-17 NOTE — Progress Notes (Signed)
Patient ID: Karina Murphy, female   DOB: 1981/05/18, 39 y.o.   MRN: [id number]   Virtual Visit via Telephone Note  I connected with Gracin Murphy on 07/23/20 at 10:50 AM EST by telephone and verified that I am speaking with the correct person using two identifiers.  Location: Patient: Karina Murphy.   Provider: Georgian Co, PA-C   I discussed the limitations, risks, security and privacy concerns of performing an evaluation and management service by telephone and the availability of in person appointments. I also discussed with the patient that there may be a patient responsible charge related to this service. The patient expressed understanding and agreed to proceed.  PATIENT visit by telephone virtually in the context of Covid-19 pandemic. Patient location:  home My Location:  [facility] office Persons on the call:  Me, the patient, and Reginia Forts the interpreter.  Carolynne Edouard screened the patient.       History of Present Illness: Patient seen at [facility] 07/07/2020 with RUQ pain and suspected gallbladder disease without any acute problems.  CMP and CBC essentially normal.  Feeling some better.  No fever.  She is continuing to have milder intermittent abdominal pain.  She is following a low fat diet.  Vomits after meals and has bloating after meals at times.  Pain has not been severe.     Observations/Objective:  NAD.  A&Ox3   Assessment and Plan:  1. RUQ pain-intermittent-suspicious for gallbladder disease.  CMP and CBC unremarkable at [facility]   Go to ED if pain worsens or vomiting worsens or fever develops.  Patient verbalizes agreement with plan.   - Ambulatory referral to General Surgery - US Abdomen Limited RUQ (LIVER/GB); Future  2. Vomiting, intractability of vomiting not specified, presence of nausea not specified, unspecified vomiting type - ondansetron (ZOFRAN) 4 MG tablet; Take 1-2 tablets (4-8 mg total) by mouth every 6 (six) hours. As needed nausea  Dispense: 30  tablet; Refill: 0 - Ambulatory referral to General Surgery - US Abdomen Limited RUQ (LIVER/GB); Future  3. Encounter for examination following treatment at hospital  4. Language barrier pacific interpreters used and additional time performing visit was required.   Follow Up Instructions: See PCP prn   I discussed the assessment and treatment plan with the patient. The patient was provided an opportunity to ask questions and all were answered. The patient agreed with the plan and demonstrated an understanding of the instructions.   The patient was advised to call back or seek an in-person evaluation if the symptoms worsen or if the condition fails to improve as anticipated.  I provided 18 minutes of non-face-to-face time during this encounter.   Georgian Co, PA-C

## 2020-07-23 ENCOUNTER — Ambulatory Visit: Payer: Self-pay | Attending: Physician Assistant | Admitting: Physician Assistant

## 2020-07-23 ENCOUNTER — Other Ambulatory Visit: Payer: Self-pay

## 2020-07-23 ENCOUNTER — Ambulatory Visit (HOSPITAL_COMMUNITY)
Admission: RE | Admit: 2020-07-23 | Discharge: 2020-07-23 | Disposition: A | Discharge status: Home / Self Care | Payer: Self-pay | Source: Ambulatory Visit | Attending: Physician Assistant | Admitting: Physician Assistant

## 2020-07-23 ENCOUNTER — Encounter: Payer: Self-pay | Admitting: Physician Assistant

## 2020-07-23 DIAGNOSIS — R1110 Vomiting, unspecified: Secondary | ICD-10-CM | POA: Insufficient documentation

## 2020-07-23 DIAGNOSIS — Z09 Encounter for follow-up examination after completed treatment for conditions other than malignant neoplasm: Secondary | ICD-10-CM

## 2020-07-23 DIAGNOSIS — Z789 Other specified health status: Secondary | ICD-10-CM

## 2020-07-23 DIAGNOSIS — R1011 Right upper quadrant pain: Secondary | ICD-10-CM

## 2020-07-23 MED ORDER — ONDANSETRON HCL 4 MG PO TABS: 4.0000 mg | ORAL_TABLET | Freq: Four times a day (QID) | ORAL | 0 refills | Status: DC

## 2020-07-23 MED FILL — ONDANSETRON HCL 4 MG TABLET: 4 | 3 days supply | Qty: 30 | Fill #0

## 2020-07-24 ENCOUNTER — Other Ambulatory Visit: Payer: Self-pay | Admitting: Physician Assistant

## 2020-07-24 DIAGNOSIS — R1110 Vomiting, unspecified: Secondary | ICD-10-CM

## 2020-07-24 DIAGNOSIS — R1011 Right upper quadrant pain: Secondary | ICD-10-CM

## 2020-07-30 ENCOUNTER — Other Ambulatory Visit: Payer: Self-pay

## 2020-07-30 ENCOUNTER — Ambulatory Visit (INDEPENDENT_AMBULATORY_CARE_PROVIDER_SITE_OTHER): Payer: Self-pay | Admitting: Surgery

## 2020-07-30 ENCOUNTER — Encounter: Payer: Self-pay | Admitting: Surgery

## 2020-07-30 VITALS — BP 127/79 | HR 76 | Temp 99.0°F | Ht 64.0 in | Wt 200.6 lb

## 2020-07-30 DIAGNOSIS — K8050 Calculus of bile duct without cholangitis or cholecystitis without obstruction: Secondary | ICD-10-CM

## 2020-07-30 NOTE — Patient Instructions (Addendum)
Britta Mccreedy, nuestra programadora de [place], se comunicar con usted en las prximas 24 a 48 horas. Durante esa llamada, hablar sobre la preparacin antes de la ciruga y tambin hablar sobre la fecha y hora de la [place]. Tenga a mano la hoja rosada cuando se comunique con usted. Si tiene [place] [employer]. Por favor llame a nuestra oficina.  Colelitiasis Cholelithiasis  La colelitiasis tambin recibe el nombre de "clculos biliares". Es un tipo de enfermedad de la vescula biliar. La vescula biliar es un rgano que almacena un lquido (bilis) que ayuda a Location manager las [place]. Los clculos biliares podran no causar sntomas (clculos biliares silenciosos) hasta provocar una obstruccin; luego, pueden causar dolor (ataque de la vescula biliar). Siga estas indicaciones en su casa:  Tome los medicamentos de venta libre y los recetados solamente como se lo haya indicado el mdico.  Mantenga un peso saludable.  Consuma alimentos saludables. Esto incluye lo siguiente: ? Comer una menor cantidad de alimentos grasos, como los alimentos fritos. ? Comer una menor cantidad de carbohidratos refinados. Los carbohidratos refinados son los panes y los cereales muy procesados, como el pan blanco y el arroz blanco. En cambio, elegir cereales integrales, como el pan integral o el arroz integral. ? Consumir ms fibra. Las [place], las frutas frescas y los frijoles son fuentes saludables de [place].  Concurra a todas las visitas de [address] se lo haya indicado el mdico. Esto es importante. Comunquese con un mdico si:  De repente, siente dolor en el costado superior derecho del vientre (abdomen). El dolor podra extenderse hasta el hombro derecho o el pecho. Estos pueden ser sntomas de un ataque de la vescula biliar.  Siente Programme researcher, broadcasting/film/video (tiene nuseas).  Devuelve (vomita).  Le han diagnosticado clculos biliares que no presentan sntomas y tiene lo siguiente: ? Dolor  abdominal. ? Molestias, ardor o sensacin de plenitud en la parte superior del vientre (empacho). Solicite ayuda de inmediato si:  De repente, siente dolor en el costado superior derecho del vientre que dura ms de 2horas.  Tiene dolor abdominal que dura ms de 5horas.  Tiene fiebre o siente escalofros.  Sigue sintiendo nuseas o vomitando.  Nota que la piel o la parte blanca del ojo estn amarillas (ictericia).  Hace pis (orina) de color oscuro.  Su materia fecal (heces) es de tono muy claro. Resumen  La colelitiasis tambin recibe el nombre de "clculos biliares".  La vescula biliar es un rgano que almacena un lquido (bilis) que ayuda a Location manager las [place].  Los clculos biliares silenciosos son clculos biliares que no causan sntomas.  Un ataque de la vescula biliar podra causar un dolor repentino en el costado superior derecho del vientre. El dolor podra extenderse hasta el hombro derecho o el pecho. Si esto ocurre, comunquese con el mdico.  Si le aparece un dolor repentino en el costado superior derecho del vientre que dura ms de 2horas, busque ayuda de inmediato. Esta informacin no tiene Theme park manager el consejo del mdico. Asegrese de hacerle al mdico cualquier pregunta que tenga. Document Revised: 02/28/2017 Document Reviewed: 02/21/2013 Elsevier Patient Education  2020 [employer].

## 2020-07-30 NOTE — Progress Notes (Signed)
07/30/2020  Reason for Visit:  RUQ abdominal pain  Referring Provider:  Angela McClung, PA-C   History of Present Illness: Karina Murphy is a 39 y.o. female presenting for evaluation of RUQ abdominal pain.  The patient reports that she's been dealing with this for a long time.  It got better a while back, and now since about a month or so ago, has been getting worse again.  She reports issues with RUQ and epigastric abdominal pain, nausea, vomiting, and bloatedness.  The pain radiates to her back.  Her symptoms are there with any meal, but greasy foods are worse.  She does not have much appetite now.  Over the past few years, she has had an extensive workup.  CT scan abdomen/pelvis in 09/2014 did not reveal any pathology.  A HIDA scan in 09/2014 showed a normal EF.  An EGD in 09/2015 showed only some mild antral gastrophathy.  A gastric emptying study in 11/2015 was also normal.  An UGI in 01/2016 showed mild reflux and small hiatal hernia.  She had an U/S and CT scan in 06/2017 which showed some sludge in the gallbladder, but no cholecystitis.    She presented to Urgent Care on 10/25 with these symptoms and was suspected to have biliary colic.  She followed up with her PCP and most recently a week ago, she had another U/S which was normal without any cholelithiasis.  However, the patient continues to have symptoms and she knows there is something wrong.  Denies any fevers, chills, chest pain, shortness of breath.  Denies any significant reflux issues and takes Prilosec daily.  She does have history of RA and takes naproxen as needed.  Past Medical History: Past Medical History:  Diagnosis Date  . Anxiety   . Arthritis, rheumatoid (HCC)   . Chronic headaches   . GERD (gastroesophageal reflux disease)   . Hypertension   . Urticaria      Past Surgical History: Past Surgical History:  Procedure Laterality Date  . NO PAST SURGERIES      Home Medications: Prior to Admission medications    Medication Sig Start Date End Date Taking? Authorizing Provider  cyclobenzaprine (FLEXERIL) 5 MG tablet Take 1-2 tablets (5-10 mg total) by mouth 2 (two) times daily as needed for muscle spasms. 06/15/20  Yes Wieters, Hallie C, PA-C  naproxen (NAPROSYN) 500 MG tablet Take 1 tablet (500 mg total) by mouth 2 (two) times daily. 06/15/20  Yes Wieters, Hallie C, PA-C  ondansetron (ZOFRAN) 4 MG tablet Take 1-2 tablets (4-8 mg total) by mouth every 6 (six) hours. As needed nausea 07/23/20  Yes McClung, Angela M, PA-C  predniSONE (DELTASONE) 10 MG tablet Take 2 tablets (20 mg total) by mouth daily. 06/15/20  Yes Wieters, Hallie C, PA-C  traMADol-acetaminophen (ULTRACET) 37.5-325 MG tablet Take 1-2 tablets by mouth every 6 (six) hours as needed. 07/07/20  Yes Nelson, Yvonne Sue, MD  albuterol (VENTOLIN HFA) 108 (90 Base) MCG/ACT inhaler Inhale 2 puffs into the lungs every 6 (six) hours as needed for wheezing or shortness of breath. Patient not taking: Reported on 07/30/2020 09/24/19   Fleming, Zelda W, NP  Fluticasone-Salmeterol (ADVAIR) 250-50 MCG/DOSE AEPB Inhale 1 puff into the lungs 2 (two) times daily. Patient not taking: Reported on 07/30/2020 09/24/19   Fleming, Zelda W, NP  omeprazole (PRILOSEC) 20 MG capsule Take 1 capsule (20 mg total) by mouth daily. 03/14/20 06/12/20  Fleming, Zelda W, NP  sertraline (ZOLOFT) 25 MG tablet Take 1 tablet (  25 mg total) by mouth daily. 03/14/20 06/12/20  Claiborne Rigg, NP  Vitamin D, Ergocalciferol, (DRISDOL) 1.25 MG (50000 UT) CAPS capsule Take 1 capsule (50,000 Units total) by mouth every 7 (seven) days. Patient not taking: Reported on 03/14/2020 01/19/19   Claiborne Rigg, NP    Allergies: Allergies  Allergen Reactions  . Eggs Or Egg-Derived Products Nausea And Vomiting    Nausea/vomitting only with whole eggs, no problem with eggs used in ingredients  . Pineapple     Social History:  reports that she has never smoked. She has never used smokeless tobacco. She  reports that she does not drink alcohol and does not use drugs.   Family History: Family History  Problem Relation Age of Onset  . Diabetes Mother   . Hypertension Mother   . Stomach cancer Neg Hx   . Colon cancer Neg Hx     Review of Systems: Review of Systems  Constitutional: Negative for chills and fever.  HENT: Negative for hearing loss.   Respiratory: Negative for shortness of breath.   Cardiovascular: Negative for chest pain.  Gastrointestinal: Positive for abdominal pain, constipation, nausea and vomiting. Negative for heartburn.  Genitourinary: Negative for dysuria.  Musculoskeletal: Positive for joint pain. Negative for myalgias.  Skin: Negative for rash.  Neurological: Negative for dizziness.  Psychiatric/Behavioral: Negative for depression.    Physical Exam BP 127/79   Pulse 76   Temp 99 F (37.2 C) (Oral)   Ht 5\' 4"  (1.626 m)   Wt 200 lb 9.6 oz (91 kg)   SpO2 98%   BMI 34.43 kg/m  CONSTITUTIONAL: No acute distress HEENT:  Normocephalic, atraumatic, extraocular motion intact. NECK: Trachea is midline, and there is no jugular venous distension.  RESPIRATORY:  Normal respiratory effort without pathologic use of accessory muscles. CARDIOVASCULAR: Regular rhythm and rate. GI: The abdomen is soft, non-distended, with some tenderness to palpation over epigastric and RUQ areas.  Negative Murphy's sign.  MUSCULOSKELETAL:  Normal muscle strength and tone in all four extremities.  No peripheral edema or cyanosis. SKIN: Skin turgor is normal. There are no pathologic skin lesions.  NEUROLOGIC:  Motor and sensation is grossly normal.  Cranial nerves are grossly intact. PSYCH:  Alert and oriented to person, place and time. Affect is normal.  Laboratory Analysis: Labs from 07/07/20: Na 140, K 3.6, Cl 105, CO2 25, BUN 13, Cr 0.79.  Total bili 0.6, AST 14, ALT 11, Alkphos 58.  WBC 4.8, Hgb 13.1, Hct 41.3, Plt 340  Imaging: U/S 07/23/20: IMPRESSION: No abnormality seen  in the right upper quadrant of the abdomen.   Assessment and Plan: This is a 39 y.o. female with RUQ pain, likely biliary colic.  --Discussed with the patient that despite of her imaging studies which at most have shown gallbladder sludge, her symptoms are more typical of biliary pathology, whether it is biliary colic vs dyskinesia.  She could possibly have a combination of both to some degree.  Her other studies have not pointed to any significant stomach pathology and she denies any significant reflux symptoms.   --I think based on her symptoms, that we can offer her cholecystectomy.  Discussed with her that since her U/S and other tests have not shown true biliary issues, a cholecystectomy may not provide relief to her symptoms, but I think at this point, this is most likely the issue at hand.  Discussed with her the role for robotic cholecystectomy and reviewed with her the risks of bleeding, infection,  and injury to surrounding structures.  She is in agreement and wishes to proceed with surgery.  She understands that she would need a COVID-19 test prior to surgery. --We will schedule her for 08/05/20.  She wants to contact the accounting office to check on costs and payments for the surgery, and will contact us if she wishes to postpone based on that.  Face-to-face time spent with the patient and care providers was 60 minutes, with more than 50% of the time spent counseling, educating, and coordinating care of the patient.     Howie Ill, MD [facility]

## 2020-07-30 NOTE — H&P (View-Only) (Signed)
07/30/2020  Reason for Visit:  RUQ abdominal pain  Referring Provider:  Georgian Co, PA-C   History of Present Illness: Karina Murphy is a 39 y.o. female presenting for evaluation of RUQ abdominal pain.  The patient reports that she's been dealing with this for a long time.  It got better a while back, and now since about a month or so ago, has been getting worse again.  She reports issues with RUQ and epigastric abdominal pain, nausea, vomiting, and bloatedness.  The pain radiates to her back.  Her symptoms are there with any meal, but greasy foods are worse.  She does not have much appetite now.  Over the past few years, she has had an extensive workup.  CT scan abdomen/pelvis in 09/2014 did not reveal any pathology.  A HIDA scan in 09/2014 showed a normal EF.  An EGD in 09/2015 showed only some mild antral gastrophathy.  A gastric emptying study in 11/2015 was also normal.  An UGI in 01/2016 showed mild reflux and small hiatal hernia.  She had an U/S and CT scan in 06/2017 which showed some sludge in the gallbladder, but no cholecystitis.    She presented to Urgent Care on 10/25 with these symptoms and was suspected to have biliary colic.  She followed up with her PCP and most recently a week ago, she had another U/S which was normal without any cholelithiasis.  However, the patient continues to have symptoms and she knows there is something wrong.  Denies any fevers, chills, chest pain, shortness of breath.  Denies any significant reflux issues and takes Prilosec daily.  She does have history of RA and takes naproxen as needed.  Past Medical History: Past Medical History:  Diagnosis Date  . Anxiety   . Arthritis, rheumatoid (HCC)   . Chronic headaches   . GERD (gastroesophageal reflux disease)   . Hypertension   . Urticaria      Past Surgical History: Past Surgical History:  Procedure Laterality Date  . NO PAST SURGERIES      Home Medications: Prior to Admission medications    Medication Sig Start Date End Date Taking? Authorizing Provider  cyclobenzaprine (FLEXERIL) 5 MG tablet Take 1-2 tablets (5-10 mg total) by mouth 2 (two) times daily as needed for muscle spasms. 06/15/20  Yes Wieters, Hallie C, PA-C  naproxen (NAPROSYN) 500 MG tablet Take 1 tablet (500 mg total) by mouth 2 (two) times daily. 06/15/20  Yes Wieters, Hallie C, PA-C  ondansetron (ZOFRAN) 4 MG tablet Take 1-2 tablets (4-8 mg total) by mouth every 6 (six) hours. As needed nausea 07/23/20  Yes Georgian Co M, PA-C  predniSONE (DELTASONE) 10 MG tablet Take 2 tablets (20 mg total) by mouth daily. 06/15/20  Yes Wieters, Hallie C, PA-C  traMADol-acetaminophen (ULTRACET) 37.5-325 MG tablet Take 1-2 tablets by mouth every 6 (six) hours as needed. 07/07/20  Yes Eustace Moore, MD  albuterol (VENTOLIN HFA) 108 (90 Base) MCG/ACT inhaler Inhale 2 puffs into the lungs every 6 (six) hours as needed for wheezing or shortness of breath. Patient not taking: Reported on 07/30/2020 09/24/19   Claiborne Rigg, NP  Fluticasone-Salmeterol (ADVAIR) 250-50 MCG/DOSE AEPB Inhale 1 puff into the lungs 2 (two) times daily. Patient not taking: Reported on 07/30/2020 09/24/19   Claiborne Rigg, NP  omeprazole (PRILOSEC) 20 MG capsule Take 1 capsule (20 mg total) by mouth daily. 03/14/20 06/12/20  Claiborne Rigg, NP  sertraline (ZOLOFT) 25 MG tablet Take 1 tablet (  25 mg total) by mouth daily. 03/14/20 06/12/20  Claiborne Rigg, NP  Vitamin D, Ergocalciferol, (DRISDOL) 1.25 MG (50000 UT) CAPS capsule Take 1 capsule (50,000 Units total) by mouth every 7 (seven) days. Patient not taking: Reported on 03/14/2020 01/19/19   Claiborne Rigg, NP    Allergies: Allergies  Allergen Reactions  . Eggs Or Egg-Derived Products Nausea And Vomiting    Nausea/vomitting only with whole eggs, no problem with eggs used in ingredients  . Pineapple     Social History:  reports that she has never smoked. She has never used smokeless tobacco. She  reports that she does not drink alcohol and does not use drugs.   Family History: Family History  Problem Relation Age of Onset  . Diabetes Mother   . Hypertension Mother   . Stomach cancer Neg Hx   . Colon cancer Neg Hx     Review of Systems: Review of Systems  Constitutional: Negative for chills and fever.  HENT: Negative for hearing loss.   Respiratory: Negative for shortness of breath.   Cardiovascular: Negative for chest pain.  Gastrointestinal: Positive for abdominal pain, constipation, nausea and vomiting. Negative for heartburn.  Genitourinary: Negative for dysuria.  Musculoskeletal: Positive for joint pain. Negative for myalgias.  Skin: Negative for rash.  Neurological: Negative for dizziness.  Psychiatric/Behavioral: Negative for depression.    Physical Exam BP 127/79   Pulse 76   Temp 99 F (37.2 C) (Oral)   Ht 5\' 4"  (1.626 m)   Wt 200 lb 9.6 oz (91 kg)   SpO2 98%   BMI 34.43 kg/m  CONSTITUTIONAL: No acute distress HEENT:  Normocephalic, atraumatic, extraocular motion intact. NECK: Trachea is midline, and there is no jugular venous distension.  RESPIRATORY:  Normal respiratory effort without pathologic use of accessory muscles. CARDIOVASCULAR: Regular rhythm and rate. GI: The abdomen is soft, non-distended, with some tenderness to palpation over epigastric and RUQ areas.  Negative Murphy's sign.  MUSCULOSKELETAL:  Normal muscle strength and tone in all four extremities.  No peripheral edema or cyanosis. SKIN: Skin turgor is normal. There are no pathologic skin lesions.  NEUROLOGIC:  Motor and sensation is grossly normal.  Cranial nerves are grossly intact. PSYCH:  Alert and oriented to person, place and time. Affect is normal.  Laboratory Analysis: Labs from 07/07/20: Na 140, K 3.6, Cl 105, CO2 25, BUN 13, Cr 0.79.  Total bili 0.6, AST 14, ALT 11, Alkphos 58.  WBC 4.8, Hgb 13.1, Hct 41.3, Plt 340  Imaging: U/S 07/23/20: IMPRESSION: No abnormality seen  in the right upper quadrant of the abdomen.   Assessment and Plan: This is a 39 y.o. female with RUQ pain, likely biliary colic.  --Discussed with the patient that despite of her imaging studies which at most have shown gallbladder sludge, her symptoms are more typical of biliary pathology, whether it is biliary colic vs dyskinesia.  She could possibly have a combination of both to some degree.  Her other studies have not pointed to any significant stomach pathology and she denies any significant reflux symptoms.   --I think based on her symptoms, that we can offer her cholecystectomy.  Discussed with her that since her U/S and other tests have not shown true biliary issues, a cholecystectomy may not provide relief to her symptoms, but I think at this point, this is most likely the issue at hand.  Discussed with her the role for robotic cholecystectomy and reviewed with her the risks of bleeding, infection,  and injury to surrounding structures.  She is in agreement and wishes to proceed with surgery.  She understands that she would need a COVID-19 test prior to surgery. --We will schedule her for 08/05/20.  She wants to contact the accounting office to check on costs and payments for the surgery, and will contact us if she wishes to postpone based on that.  Face-to-face time spent with the patient and care providers was 60 minutes, with more than 50% of the time spent counseling, educating, and coordinating care of the patient.     Howie Ill, MD [facility]

## 2020-08-01 ENCOUNTER — Other Ambulatory Visit: Admission: RE | Admit: 2020-08-01 | Payer: Self-pay | Source: Ambulatory Visit

## 2020-08-01 ENCOUNTER — Telehealth: Payer: Self-pay | Admitting: Surgery

## 2020-08-01 NOTE — Telephone Encounter (Signed)
Spoke with daughter who spoke English well, she and her mother are informed of Pre-Admission date/time, COVID Testing date and Surgery date.  Surgery Date: 08/05/20 Preadmission Testing Date: 08/05/20 (Arrive 2 hours early) Covid Testing Date: 08/04/20 Patient and daughter informed to have this done before 10:00 am at [address] [facility].    Patient and daughter also to call [phone], between 1-3:00pm the day before surgery, to find out what time to arrive for surgery.    Also reminded to be NPO after midnight the day before surgery.  All questions are answered and they voice understanding.

## 2020-08-04 ENCOUNTER — Other Ambulatory Visit
Admission: RE | Admit: 2020-08-04 | Discharge: 2020-08-04 | Disposition: A | Discharge status: Home / Self Care | Payer: HRSA Program | Source: Ambulatory Visit | Attending: Surgery | Admitting: Surgery

## 2020-08-04 ENCOUNTER — Other Ambulatory Visit: Payer: Self-pay

## 2020-08-04 DIAGNOSIS — Z01812 Encounter for preprocedural laboratory examination: Secondary | ICD-10-CM | POA: Insufficient documentation

## 2020-08-04 DIAGNOSIS — Z20822 Contact with and (suspected) exposure to covid-19: Secondary | ICD-10-CM | POA: Insufficient documentation

## 2020-08-04 LAB — SARS CORONAVIRUS 2 (TAT 6-24 HRS): SARS Coronavirus 2: NEGATIVE

## 2020-08-04 MED ORDER — INDOCYANINE GREEN 25 MG IV SOLR
2.5000 mg | Freq: Once | INTRAVENOUS | Status: AC
  Administered 2020-08-05: 2.5 mg via INTRAVENOUS
  Filled 2020-08-04: qty 1

## 2020-08-05 ENCOUNTER — Ambulatory Visit
Admission: RE | Admit: 2020-08-05 | Discharge: 2020-08-05 | Disposition: A | Discharge status: Home / Self Care | Payer: Self-pay | Attending: Surgery | Admitting: Surgery

## 2020-08-05 ENCOUNTER — Other Ambulatory Visit: Payer: Self-pay

## 2020-08-05 ENCOUNTER — Encounter: Payer: Self-pay | Admitting: Surgery

## 2020-08-05 ENCOUNTER — Ambulatory Visit: Payer: Self-pay | Admitting: Certified Registered Nurse Anesthetist

## 2020-08-05 ENCOUNTER — Encounter
Admission: RE | Disposition: A | Discharge status: Home / Self Care | Payer: Self-pay | Source: Home / Self Care | Attending: Surgery

## 2020-08-05 DIAGNOSIS — K811 Chronic cholecystitis: Secondary | ICD-10-CM | POA: Insufficient documentation

## 2020-08-05 DIAGNOSIS — K219 Gastro-esophageal reflux disease without esophagitis: Secondary | ICD-10-CM | POA: Insufficient documentation

## 2020-08-05 DIAGNOSIS — K8050 Calculus of bile duct without cholangitis or cholecystitis without obstruction: Secondary | ICD-10-CM

## 2020-08-05 DIAGNOSIS — Z79899 Other long term (current) drug therapy: Secondary | ICD-10-CM | POA: Insufficient documentation

## 2020-08-05 DIAGNOSIS — Z7952 Long term (current) use of systemic steroids: Secondary | ICD-10-CM | POA: Insufficient documentation

## 2020-08-05 DIAGNOSIS — Z791 Long term (current) use of non-steroidal anti-inflammatories (NSAID): Secondary | ICD-10-CM | POA: Insufficient documentation

## 2020-08-05 LAB — POCT PREGNANCY, URINE: Preg Test, Ur: NEGATIVE

## 2020-08-05 SURGERY — CHOLECYSTECTOMY, ROBOT-ASSISTED, LAPAROSCOPIC
Anesthesia: General

## 2020-08-05 MED ORDER — GABAPENTIN 300 MG PO CAPS: 300.0000 mg | ORAL_CAPSULE | ORAL | Status: AC

## 2020-08-05 MED ORDER — OXYCODONE HCL 5 MG PO TABS: 5.0000 mg | ORAL_TABLET | Freq: Once | ORAL | Status: AC

## 2020-08-05 MED ORDER — OXYCODONE HCL 5 MG PO TABS
ORAL_TABLET | ORAL | Status: AC
  Administered 2020-08-05: 5 mg via ORAL
  Filled 2020-08-05: qty 1

## 2020-08-05 MED ORDER — CHLORHEXIDINE GLUCONATE 0.12 % MT SOLN: 15.0000 mL | Freq: Once | OROMUCOSAL | Status: AC

## 2020-08-05 MED ORDER — ACETAMINOPHEN 500 MG PO TABS
ORAL_TABLET | ORAL | Status: AC
  Administered 2020-08-05: 1000 mg via ORAL
  Filled 2020-08-05: qty 2

## 2020-08-05 MED ORDER — ROCURONIUM BROMIDE 10 MG/ML (PF) SYRINGE
PREFILLED_SYRINGE | INTRAVENOUS | Status: AC
  Filled 2020-08-05: qty 10

## 2020-08-05 MED ORDER — ORAL CARE MOUTH RINSE: 15.0000 mL | Freq: Once | OROMUCOSAL | Status: AC

## 2020-08-05 MED ORDER — ONDANSETRON HCL 4 MG/2ML IJ SOLN
INTRAMUSCULAR | Status: AC
  Administered 2020-08-05: 4 mg via INTRAVENOUS
  Filled 2020-08-05: qty 2

## 2020-08-05 MED ORDER — CHLORHEXIDINE GLUCONATE 0.12 % MT SOLN
OROMUCOSAL | Status: AC
  Administered 2020-08-05: 15 mL via OROMUCOSAL
  Filled 2020-08-05: qty 15

## 2020-08-05 MED ORDER — SODIUM CHLORIDE FLUSH 0.9 % IV SOLN
INTRAVENOUS | Status: AC
  Filled 2020-08-05: qty 10

## 2020-08-05 MED ORDER — ACETAMINOPHEN 500 MG PO TABS: 1000.0000 mg | ORAL_TABLET | ORAL | Status: AC

## 2020-08-05 MED ORDER — MIDAZOLAM HCL 2 MG/2ML IJ SOLN
INTRAMUSCULAR | Status: DC | PRN
  Administered 2020-08-05: 2 mg via INTRAVENOUS

## 2020-08-05 MED ORDER — ONDANSETRON HCL 4 MG/2ML IJ SOLN: 4.0000 mg | Freq: Once | INTRAMUSCULAR | Status: AC | PRN

## 2020-08-05 MED ORDER — ROCURONIUM BROMIDE 100 MG/10ML IV SOLN
INTRAVENOUS | Status: DC | PRN
  Administered 2020-08-05: 10 mg via INTRAVENOUS
  Administered 2020-08-05: 50 mg via INTRAVENOUS

## 2020-08-05 MED ORDER — IBUPROFEN 600 MG PO TABS: 600.0000 mg | ORAL_TABLET | Freq: Three times a day (TID) | ORAL | 1 refills | Status: DC | PRN

## 2020-08-05 MED ORDER — DEXAMETHASONE SODIUM PHOSPHATE 10 MG/ML IJ SOLN
INTRAMUSCULAR | Status: AC
  Filled 2020-08-05: qty 1

## 2020-08-05 MED ORDER — DEXAMETHASONE SODIUM PHOSPHATE 10 MG/ML IJ SOLN
INTRAMUSCULAR | Status: DC | PRN
  Administered 2020-08-05: 10 mg via INTRAVENOUS

## 2020-08-05 MED ORDER — ONDANSETRON HCL 4 MG/2ML IJ SOLN
INTRAMUSCULAR | Status: DC | PRN
  Administered 2020-08-05: 4 mg via INTRAVENOUS

## 2020-08-05 MED ORDER — KETOROLAC TROMETHAMINE 30 MG/ML IJ SOLN
INTRAMUSCULAR | Status: DC | PRN
  Administered 2020-08-05: 30 mg via INTRAVENOUS

## 2020-08-05 MED ORDER — PROPOFOL 10 MG/ML IV BOLUS
INTRAVENOUS | Status: DC | PRN
  Administered 2020-08-05: 140 mg via INTRAVENOUS

## 2020-08-05 MED ORDER — BUPIVACAINE-EPINEPHRINE (PF) 0.25% -1:200000 IJ SOLN
INTRAMUSCULAR | Status: DC | PRN
  Administered 2020-08-05: 30 mL

## 2020-08-05 MED ORDER — FENTANYL CITRATE (PF) 100 MCG/2ML IJ SOLN
INTRAMUSCULAR | Status: AC
  Administered 2020-08-05: 25 ug via INTRAVENOUS
  Filled 2020-08-05: qty 2

## 2020-08-05 MED ORDER — CEFAZOLIN SODIUM-DEXTROSE 2-4 GM/100ML-% IV SOLN
INTRAVENOUS | Status: AC
  Filled 2020-08-05: qty 100

## 2020-08-05 MED ORDER — CHLORHEXIDINE GLUCONATE CLOTH 2 % EX PADS
6.0000 | MEDICATED_PAD | Freq: Once | CUTANEOUS | Status: AC
  Administered 2020-08-05: 6 via TOPICAL

## 2020-08-05 MED ORDER — FENTANYL CITRATE (PF) 100 MCG/2ML IJ SOLN
INTRAMUSCULAR | Status: DC | PRN
  Administered 2020-08-05: 50 ug via INTRAVENOUS
  Administered 2020-08-05: 100 ug via INTRAVENOUS

## 2020-08-05 MED ORDER — FENTANYL CITRATE (PF) 100 MCG/2ML IJ SOLN
25.0000 ug | INTRAMUSCULAR | Status: AC | PRN
  Administered 2020-08-05 (×6): 25 ug via INTRAVENOUS

## 2020-08-05 MED ORDER — LIDOCAINE HCL (PF) 2 % IJ SOLN
INTRAMUSCULAR | Status: AC
  Filled 2020-08-05: qty 5

## 2020-08-05 MED ORDER — FENTANYL CITRATE (PF) 100 MCG/2ML IJ SOLN
INTRAMUSCULAR | Status: AC
  Filled 2020-08-05: qty 2

## 2020-08-05 MED ORDER — GABAPENTIN 300 MG PO CAPS
ORAL_CAPSULE | ORAL | Status: AC
  Administered 2020-08-05: 300 mg via ORAL
  Filled 2020-08-05: qty 1

## 2020-08-05 MED ORDER — CEFAZOLIN SODIUM-DEXTROSE 2-4 GM/100ML-% IV SOLN
2.0000 g | INTRAVENOUS | Status: AC
  Administered 2020-08-05: 2 g via INTRAVENOUS

## 2020-08-05 MED ORDER — PROMETHAZINE HCL 25 MG/ML IJ SOLN: 25.0000 mg | Freq: Once | INTRAMUSCULAR | Status: DC

## 2020-08-05 MED ORDER — LACTATED RINGERS IV SOLN: INTRAVENOUS | Status: DC

## 2020-08-05 MED ORDER — PROPOFOL 10 MG/ML IV BOLUS
INTRAVENOUS | Status: AC
  Filled 2020-08-05: qty 20

## 2020-08-05 MED ORDER — LIDOCAINE HCL (CARDIAC) PF 100 MG/5ML IV SOSY
PREFILLED_SYRINGE | INTRAVENOUS | Status: DC | PRN
  Administered 2020-08-05: 100 mg via INTRAVENOUS

## 2020-08-05 MED ORDER — PROMETHAZINE HCL 25 MG/ML IJ SOLN
6.2500 mg | Freq: Once | INTRAMUSCULAR | Status: AC
  Administered 2020-08-05: 6.25 mg via INTRAVENOUS

## 2020-08-05 MED ORDER — BUPIVACAINE-EPINEPHRINE (PF) 0.25% -1:200000 IJ SOLN
INTRAMUSCULAR | Status: AC
  Filled 2020-08-05: qty 30

## 2020-08-05 MED ORDER — MIDAZOLAM HCL 2 MG/2ML IJ SOLN
INTRAMUSCULAR | Status: AC
  Filled 2020-08-05: qty 2

## 2020-08-05 MED ORDER — ONDANSETRON HCL 4 MG/2ML IJ SOLN
INTRAMUSCULAR | Status: AC
  Filled 2020-08-05: qty 2

## 2020-08-05 MED ORDER — SUGAMMADEX SODIUM 200 MG/2ML IV SOLN
INTRAVENOUS | Status: DC | PRN
Start: 2020-08-05 — End: 2020-08-05
  Administered 2020-08-05: 200 mg via INTRAVENOUS

## 2020-08-05 MED ORDER — EPHEDRINE SULFATE 50 MG/ML IJ SOLN
INTRAMUSCULAR | Status: DC | PRN
  Administered 2020-08-05 (×2): 10 mg via INTRAVENOUS

## 2020-08-05 MED ORDER — PROMETHAZINE HCL 25 MG/ML IJ SOLN
INTRAMUSCULAR | Status: AC
  Filled 2020-08-05: qty 1

## 2020-08-05 MED ORDER — OXYCODONE HCL 5 MG PO TABS: 5.0000 mg | ORAL_TABLET | ORAL | 0 refills | Status: DC | PRN

## 2020-08-05 SURGICAL SUPPLY — 61 items
ADH SKN CLS APL DERMABOND .7 (GAUZE/BANDAGES/DRESSINGS) ×1
APL PRP STRL LF DISP 70% ISPRP (MISCELLANEOUS) ×1
BAG INFUSER PRESSURE 100CC (MISCELLANEOUS) IMPLANT
BAG SPEC RTRVL LRG 6X4 10 (ENDOMECHANICALS) ×1
CANISTER SUCT 1200ML W/VALVE (MISCELLANEOUS) ×1 IMPLANT
CANNULA REDUC XI 12-8 STAPL (CANNULA) ×2
CANNULA REDUC XI 12-8MM STAPL (CANNULA) ×1
CANNULA REDUCER 12-8 DVNC XI (CANNULA) ×1 IMPLANT
CHLORAPREP W/TINT 26 (MISCELLANEOUS) ×3 IMPLANT
CLIP VESOLOCK MED LG 6/CT (CLIP) ×5 IMPLANT
COVER TIP SHEARS 8 DVNC (MISCELLANEOUS) IMPLANT
COVER TIP SHEARS 8MM DA VINCI (MISCELLANEOUS) ×3
COVER WAND RF STERILE (DRAPES) ×3 IMPLANT
CUP MEDICINE 2OZ PLAST GRAD ST (MISCELLANEOUS) ×3 IMPLANT
DECANTER SPIKE VIAL GLASS SM (MISCELLANEOUS) ×3 IMPLANT
DEFOGGER SCOPE WARMER CLEARIFY (MISCELLANEOUS) ×3 IMPLANT
DERMABOND ADVANCED (GAUZE/BANDAGES/DRESSINGS) ×2
DERMABOND ADVANCED .7 DNX12 (GAUZE/BANDAGES/DRESSINGS) ×1 IMPLANT
DRAPE ARM DVNC X/XI (DISPOSABLE) ×4 IMPLANT
DRAPE COLUMN DVNC XI (DISPOSABLE) ×1 IMPLANT
DRAPE DA VINCI XI ARM (DISPOSABLE) ×12
DRAPE DA VINCI XI COLUMN (DISPOSABLE) ×3
ELECT CAUTERY BLADE TIP 2.5 (TIP) ×3
ELECT REM PT RETURN 9FT ADLT (ELECTROSURGICAL) ×3
ELECTRODE CAUTERY BLDE TIP 2.5 (TIP) ×1 IMPLANT
ELECTRODE REM PT RTRN 9FT ADLT (ELECTROSURGICAL) ×1 IMPLANT
GLOVE SURG SYN 7.0 (GLOVE) ×6 IMPLANT
GLOVE SURG SYN 7.0 PF PI (GLOVE) ×2 IMPLANT
GLOVE SURG SYN 7.5  E (GLOVE) ×6
GLOVE SURG SYN 7.5 E (GLOVE) ×2 IMPLANT
GLOVE SURG SYN 7.5 PF PI (GLOVE) ×2 IMPLANT
GOWN STRL REUS W/ TWL LRG LVL3 (GOWN DISPOSABLE) ×4 IMPLANT
GOWN STRL REUS W/TWL LRG LVL3 (GOWN DISPOSABLE) ×12
IRRIGATOR SUCT 8 DISP DVNC XI (IRRIGATION / IRRIGATOR) IMPLANT
IRRIGATOR SUCTION 8MM XI DISP (IRRIGATION / IRRIGATOR)
IV NS 1000ML (IV SOLUTION)
IV NS 1000ML BAXH (IV SOLUTION) IMPLANT
KIT PINK PAD W/HEAD ARE REST (MISCELLANEOUS) ×3
KIT PINK PAD W/HEAD ARM REST (MISCELLANEOUS) ×1 IMPLANT
LABEL OR SOLS (LABEL) ×3 IMPLANT
MANIFOLD NEPTUNE II (INSTRUMENTS) ×3 IMPLANT
NEEDLE HYPO 22GX1.5 SAFETY (NEEDLE) ×3 IMPLANT
NS IRRIG 500ML POUR BTL (IV SOLUTION) ×3 IMPLANT
OBTURATOR OPTICAL STANDARD 8MM (TROCAR) ×3
OBTURATOR OPTICAL STND 8 DVNC (TROCAR) ×1
OBTURATOR OPTICALSTD 8 DVNC (TROCAR) ×1 IMPLANT
PACK LAP CHOLECYSTECTOMY (MISCELLANEOUS) ×3 IMPLANT
PENCIL ELECTRO HAND CTR (MISCELLANEOUS) ×3 IMPLANT
POUCH SPECIMEN RETRIEVAL 10MM (ENDOMECHANICALS) ×3 IMPLANT
SEAL CANN UNIV 5-8 DVNC XI (MISCELLANEOUS) ×4 IMPLANT
SEAL XI 5MM-8MM UNIVERSAL (MISCELLANEOUS) ×12
SET TUBE SMOKE EVAC HIGH FLOW (TUBING) ×3 IMPLANT
SOLUTION ELECTROLUBE (MISCELLANEOUS) ×3 IMPLANT
SPONGE LAP 18X18 RF (DISPOSABLE) IMPLANT
SPONGE LAP 4X18 RFD (DISPOSABLE) ×3 IMPLANT
STAPLER CANNULA SEAL DVNC XI (STAPLE) ×1 IMPLANT
STAPLER CANNULA SEAL XI (STAPLE) ×3
SUT MNCRL AB 4-0 PS2 18 (SUTURE) ×3 IMPLANT
SUT VICRYL 0 AB UR-6 (SUTURE) ×6 IMPLANT
TAPE TRANSPORE STRL 2 31045 (GAUZE/BANDAGES/DRESSINGS) ×3 IMPLANT
TROCAR BALLN GELPORT 12X130M (ENDOMECHANICALS) ×3 IMPLANT

## 2020-08-05 NOTE — Anesthesia Postprocedure Evaluation (Signed)
Anesthesia Post Note  Patient: Karina Murphy  Procedure(s) Performed: XI ROBOTIC ASSISTED LAPAROSCOPIC CHOLECYSTECTOMY (N/A ) INDOCYANINE GREEN FLUORESCENCE IMAGING (ICG) (N/A )  Patient location during evaluation: PACU Anesthesia Type: General Level of consciousness: awake and alert Pain management: pain level controlled Vital Signs Assessment: post-procedure vital signs reviewed and stable Respiratory status: spontaneous breathing, nonlabored ventilation, respiratory function stable and patient connected to nasal cannula oxygen Cardiovascular status: blood pressure returned to baseline and stable Postop Assessment: no apparent nausea or vomiting Anesthetic complications: no   No complications documented.   Last Vitals:  Vitals:   08/05/20 1732 08/05/20 1744  BP: 126/77 124/62  Pulse: 72 66  Resp: 19 18  Temp: (!) 36.2 C (!) 36.2 C  SpO2: 96% 97%    Last Pain:  Vitals:   08/05/20 1744  TempSrc: Temporal  PainSc: 3                  Cleda Mccreedy Annabel Gibeau

## 2020-08-05 NOTE — Discharge Instructions (Signed)

## 2020-08-05 NOTE — Progress Notes (Signed)
EKG reviewed  by Dr. Henrene Hawking prior to surgery

## 2020-08-05 NOTE — Op Note (Signed)
  Procedure Date:  08/05/2020  Pre-operative Diagnosis:  Biliary colic  Post-operative Diagnosis:  Biliary colic  Procedure:  Robotic assisted cholecystectomy with ICG FireFly cholangiogram  Surgeon:  Howie Ill, MD  Anesthesia:  General endotracheal  Estimated Blood Loss:  10 ml  Specimens:  gallbladder  Complications:  None  Indications for Procedure:  This is a 39 y.o. female who presents with abdominal pain and workup revealing biliary colic.  The benefits, complications, treatment options, and expected outcomes were discussed with the patient. The risks of bleeding, infection, recurrence of symptoms, failure to resolve symptoms, bile duct damage, bile duct leak, retained common bile duct stone, bowel injury, and need for further procedures were all discussed with the patient and she was willing to proceed.  Description of Procedure: The patient was correctly identified in the preoperative area and brought into the operating room.  The patient was placed supine with VTE prophylaxis in place.  Appropriate time-outs were performed.  Anesthesia was induced and the patient was intubated.  Appropriate antibiotics were infused.  The abdomen was prepped and draped in a sterile fashion. An infraumbilical incision was made. A cutdown technique was used to enter the abdominal cavity without injury, and a 12 mm robotic port was inserted.  Pneumoperitoneum was obtained with appropriate opening pressures.  Three 8-mm ports were placed in the mid abdomen at the level of the umbilicus under direct visualization.  The DaVinci platform was docked, camera targeted, and instruments were placed under direct visualization.  The gallbladder was identified.  The fundus was grasped and retracted cephalad.  Adhesions were lysed bluntly and with electrocautery. The infundibulum was grasped and retracted laterally, exposing the peritoneum overlying the gallbladder.  This was incised with electrocautery and  extended on either side of the gallbladder.  FireFly cholangiogram was then obtained, and we were able to clearly identify the cystic duct and common bile duct.  The cystic duct and anterior and posterior branches of the cystic artery were carefully dissected with combination of cautery and blunt dissection.  All were clipped twice proximally and once distally, cutting in between.  The gallbladder was taken from the gallbladder fossa in a retrograde fashion with electrocautery. The gallbladder was placed in an Endocatch bag. The liver bed was inspected and any bleeding was controlled with electrocautery. The right upper quadrant was then inspected again revealing intact clips, no bleeding, and no ductal injury.  The 8 mm ports were removed under direct visualization and the 12 mm port was removed.  The Endocatch bag was brought out via the umbilical incision. The fascial opening was closed using 0 vicryl suture.  Local anesthetic was infused in all incisions and the incisions were closed with 4-0 Monocryl.  The wounds were cleaned and sealed with DermaBond.  The patient was emerged from anesthesia and extubated and brought to the recovery room for further management.  The patient tolerated the procedure well and all counts were correct at the end of the case.   Howie Ill, MD

## 2020-08-05 NOTE — Progress Notes (Signed)
Teaching done by spanish interpreter from the Interpreter hotline

## 2020-08-05 NOTE — Interval H&P Note (Signed)
History and Physical Interval Note:  08/05/2020 2:07 PM  Karina Murphy  has presented today for surgery, with the diagnosis of biliary colic.  The various methods of treatment have been discussed with the patient and family. After consideration of risks, benefits and other options for treatment, the patient has consented to  Procedure(s): XI ROBOTIC ASSISTED LAPAROSCOPIC CHOLECYSTECTOMY (N/A) INDOCYANINE GREEN FLUORESCENCE IMAGING (ICG) (N/A) as a surgical intervention.  The patient's history has been reviewed, patient examined, no change in status, stable for surgery.  I have reviewed the patient's chart and labs.  Questions were answered to the patient's satisfaction.     Karina Murphy

## 2020-08-05 NOTE — Anesthesia Preprocedure Evaluation (Signed)
Anesthesia Evaluation  Patient identified by MRN, date of birth, ID band Patient awake    Reviewed: Allergy & Precautions, NPO status , Patient's Chart, lab work & pertinent test results  History of Anesthesia Complications Negative for: history of anesthetic complications  Airway Mallampati: III       Dental   Pulmonary neg sleep apnea, neg COPD, Not current smoker,           Cardiovascular hypertension, Pt. on medications      Neuro/Psych neg Seizures Anxiety    GI/Hepatic Neg liver ROS, GERD  ,  Endo/Other  neg diabetes  Renal/GU negative Renal ROS     Musculoskeletal   Abdominal   Peds  Hematology   Anesthesia Other Findings   Reproductive/Obstetrics                             Anesthesia Physical Anesthesia Plan  ASA: II  Anesthesia Plan: General   Post-op Pain Management:    Induction: Intravenous  PONV Risk Score and Plan: 3 and Ondansetron and Dexamethasone  Airway Management Planned: Oral ETT  Additional Equipment:   Intra-op Plan:   Post-operative Plan:   Informed Consent: I have reviewed the patients History and Physical, chart, labs and discussed the procedure including the risks, benefits and alternatives for the proposed anesthesia with the patient or authorized representative who has indicated his/her understanding and acceptance.       Plan Discussed with:   Anesthesia Plan Comments:         Anesthesia Quick Evaluation

## 2020-08-05 NOTE — Anesthesia Procedure Notes (Signed)
Procedure Name: Intubation Performed by: Starr, Deana, CRNA Pre-anesthesia Checklist: Patient identified, Patient being monitored, Timeout performed, Emergency Drugs available and Suction available Patient Re-evaluated:Patient Re-evaluated prior to induction Oxygen Delivery Method: Circle system utilized Preoxygenation: Pre-oxygenation with 100% oxygen Induction Type: IV induction Ventilation: Mask ventilation without difficulty Laryngoscope Size: 3 and McGraph Grade View: Grade I Tube type: Oral Tube size: 7.0 mm Number of attempts: 1 Airway Equipment and Method: Stylet Placement Confirmation: ETT inserted through vocal cords under direct vision,  positive ETCO2 and breath sounds checked- equal and bilateral Secured at: 21 cm Tube secured with: Tape Dental Injury: Teeth and Oropharynx as per pre-operative assessment        

## 2020-08-05 NOTE — Transfer of Care (Signed)
Immediate Anesthesia Transfer of Care Note  Patient: Karina Murphy  Procedure(s) Performed: XI ROBOTIC ASSISTED LAPAROSCOPIC CHOLECYSTECTOMY (N/A ) INDOCYANINE GREEN FLUORESCENCE IMAGING (ICG) (N/A )  Patient Location: PACU  Anesthesia Type:General  Level of Consciousness: drowsy  Airway & Oxygen Therapy: Patient Spontanous Breathing and Patient connected to face mask oxygen  Post-op Assessment: Report given to RN and Post -op Vital signs reviewed and stable  Post vital signs: Reviewed and stable  Last Vitals:  Vitals Value Taken Time  BP 126/70 08/05/20 1624  Temp 36 C 08/05/20 1621  Pulse 85 08/05/20 1629  Resp 16 08/05/20 1629  SpO2 100 % 08/05/20 1629  Vitals shown include unvalidated device data.  Last Pain:  Vitals:   08/05/20 1621  TempSrc:   PainSc: Asleep         Complications: No complications documented.

## 2020-08-08 LAB — SURGICAL PATHOLOGY

## 2020-08-25 ENCOUNTER — Encounter: Payer: Self-pay | Admitting: Surgery

## 2020-08-25 ENCOUNTER — Ambulatory Visit (INDEPENDENT_AMBULATORY_CARE_PROVIDER_SITE_OTHER): Payer: Self-pay | Admitting: Surgery

## 2020-08-25 ENCOUNTER — Other Ambulatory Visit: Payer: Self-pay

## 2020-08-25 VITALS — BP 114/75 | HR 69 | Temp 98.7°F | Ht 64.0 in | Wt 198.0 lb

## 2020-08-25 DIAGNOSIS — K811 Chronic cholecystitis: Secondary | ICD-10-CM

## 2020-08-25 NOTE — Patient Instructions (Signed)

## 2020-08-25 NOTE — Progress Notes (Signed)
08/25/2020  HPI: Shalissa Easterwood is a 39 y.o. female s/p robotic assisted cholecystectomy on 11/23.  Patient presents for follow up.  She reports that after surgery, she was taking Oxycodone q4 hrs due to the discomfort, but now she's only needing Ibuprofen.  She reports having some constipation/diarrhea.  She also reports having some nausea and sensation of early fullness.  Denies any issues with the incisions.  She's now able to eat better, though small meals.  Vital signs: BP 114/75   Pulse 69   Temp 98.7 F (37.1 C) (Oral)   Ht 5\' 4"  (1.626 m)   Wt 198 lb (89.8 kg)   SpO2 97%   BMI 33.99 kg/m    Physical Exam: Constitutional:  No acute distress Abdomen: Soft, non-distended, appropriately sore to palpation.  Incisions are clean, dry, intact, without any evidence of infection or breakdown.  Assessment/Plan: This is a 39 y.o. female s/p robotic assisted cholecystectomy.  --Reviewed pathology with patient -- chronic cholecystitis.   --Patient reports that now she's feeling better after surgery.  I think that some of her current symptoms are part of the adjustment process of not having a gallbladder.  Discussed with her that these typically improve about 2-3 weeks from surgery.  She's already improving in some ways.   --She may take Miralax and increase her fluid intake to help with any constipation.   --Follow up as needed, or sooner if her symptoms do not improve after 2-3 weeks.   30, MD [facility]

## 2020-10-28 ENCOUNTER — Other Ambulatory Visit: Payer: Self-pay | Admitting: Family Medicine

## 2020-10-28 ENCOUNTER — Ambulatory Visit: Payer: Self-pay | Attending: Family Medicine | Admitting: Family Medicine

## 2020-10-28 ENCOUNTER — Encounter: Payer: Self-pay | Admitting: Family Medicine

## 2020-10-28 ENCOUNTER — Other Ambulatory Visit: Payer: Self-pay

## 2020-10-28 VITALS — BP 121/70 | HR 78 | Ht 64.0 in | Wt 197.0 lb

## 2020-10-28 DIAGNOSIS — T7840XA Allergy, unspecified, initial encounter: Secondary | ICD-10-CM

## 2020-10-28 MED ORDER — PREDNISONE 20 MG PO TABS: 20.0000 mg | ORAL_TABLET | Freq: Every day | ORAL | 0 refills | Status: DC

## 2020-10-28 MED ORDER — DIPHENHYDRAMINE-ZINC ACETATE 2-0.1 % EX CREA: 1.0000 "application " | TOPICAL_CREAM | Freq: Three times a day (TID) | CUTANEOUS | 0 refills | Status: DC | PRN

## 2020-10-28 MED FILL — predniSONE 20 MG TABS: 20 | 5 days supply | Qty: 5 | Fill #0

## 2020-10-28 NOTE — Patient Instructions (Signed)
Ronchas Hives Las ronchas (urticaria) son reas enrojecidas e hinchadas en la piel que ocasionan picazn. Las ronchas pueden aparecer en cualquier parte del cuerpo. Las ronchas suelen mejorar en el transcurso de 24horas (ronchas [place]). En algunos casos, aparecen ronchas nuevas despus de que las viejas desaparecen, y Nutritional therapist puede prolongarse durante varios das o semanas (ronchas crnicas). No se transmiten de persona a persona (no son contagiosas). Las ronchas provienen de la reaccin del organismo a algo a lo que la persona es Counselling psychologist (alrgeno), algo que causa irritacin o varios otros factores desencadenantes. Cuando una persona se expone a un factor desencadenante, su cuerpo libera una sustancia qumica (histamina) que causa enrojecimiento, picazn e hinchazn. Las ronchas pueden aparecer inmediatamente despus de la exposicin a un factor desencadenante u horas ms tarde. Cules son las causas? Esta afeccin puede ser causada por lo siguiente:  Alergias a alimentos o ingredientes.  Las picaduras o mordeduras de insectos.  Exposicin al polen o a las mascotas.  Contacto con el ltex o con sustancias qumicas.  Exposicin a la luz del sol, al calor o al fro.  Realizar actividad fsica.  Estrs.  Ciertos medicamentos. Otras afecciones y tratamientos tambin pueden ocasionar ronchas, tales como:  Virus, incluido el virus de la gripe.  Infecciones bacterianas, como infecciones de las vas urinarias y Paramedic.  Ciertos medicamentos.  Vacunas contra la Programmer, multimedia.  Transfusiones de [place]. Algunas veces, se desconoce la causa de esta afeccin (ronchas idiopticas). Qu incrementa el riesgo? Es ms probable que tenga esta afeccin si:  Es mujer.  Tiene [employer], en especial a los ctricos, la [place], los [place], el man, los frutos secos o los mariscos.  Es alrgico a: ? Media planner. ? Ltex. ? Insectos. ? Animales. ? Polen. Cules  son los signos o los sntomas? Los sntomas frecuentes de esta afeccin incluyen la presencia de reas o protuberancias elevadas de color rojo o blanco en la piel que ocasionan picazn. Estas reas:  Pueden convertirse en ronchas grandes e inflamadas.  Pueden cambiar de forma y [place] rpida y repetida.  Pueden aparecer en forma de ronchas separadas o ronchas que se conectan y abarcan una zona extensa de la piel.  Pueden causar escozor o volverse dolorosas.  Pueden volverse blancas (palidecer) al presionar en el centro. En casos graves, las [place], los pies y el rostro tambin pueden hincharse. Esto podra ocurrir si las ronchas se desarrollan ms profundamente en la piel.   Cmo se diagnostica? Esta afeccin se puede diagnosticar por sus sntomas, sus antecedentes mdicos y un examen fsico.  Podran realizarle pruebas de [place], orina o piel para descubrir qu es lo que causa las ronchas y Sales promotion account executive otros problemas de Freight forwarder.  El mdico tambin podra tomar una pequea [place] de piel del rea afectada y analizarla con un microscopio (biopsia). Cmo se trata? El tratamiento de esta afeccin depende de la causa y la gravedad de los sntomas. El mdico podra recomendarle aplicar paos mojados con agua fra (compresas fras) o tomar duchas con agua fra para Associate Professor. El tratamiento puede incluir:  Medicamentos para lo siguiente: ? Aliviar la picazn (antihistamnicos). ? Reducir la hinchazn (corticoesteroides). ? Tratar la infeccin (antibiticos).  Medicamentos inyectables (omalizumab). El mdico podra recetar esto si usted presenta ronchas idiopticas crnicas y contina teniendo sntomas aun despus del tratamiento con antihistamnicos. En los casos ms graves, podra ser necesario aplicar una inyeccin de adrenalina (epinefrina) para prevenir una reaccin alrgica potencialmente mortal (anafilaxis). Siga estas indicaciones en  su casa: Medicamentos  Tome y  [employer] medicamentos de venta libre y los recetados solamente como se lo haya indicado el mdico.  Si le recetaron un antibitico, tmelo como se lo haya indicado el mdico. No deje de usar el antibitico aunque comience a Actor. Cuidado de la piel  Aplique compresas fras en las zonas afectadas.  No se rasque ni se refriegue la piel. Indicaciones generales  No se duche ni tome baos de inmersin con agua caliente. Podra empeorar la picazn.  No use ropa ajustada.  Use pantalla solar y ropa protectora cuando est al [place].  Evite las sustancias que causan las ronchas. Lleve un registro para Presenter, broadcasting de aquello que le causa ronchas. Escriba los siguientes datos: ? Los medicamentos que toma. ? Lo que usted come y bebe. ? Los productos que [place] en la piel.  Concurra a todas las visitas de [address] se lo haya indicado el mdico. Esto es importante. Comunquese con un mdico si:  Los sntomas no se [employer].  Le duelen las articulaciones o estas se inflaman. Solicite ayuda inmediatamente si:  Tiene fiebre.  Tiene dolor en el abdomen.  Tiene la lengua o los labios hinchados.  Tiene los prpados hinchados.  Siente el pecho o la garganta cerrados.  Tiene problemas para respirar o tragar. Estos sntomas pueden representar un problema grave que constituye Radio broadcast assistant. No espere a ver si los sntomas desaparecen. Solicite atencin mdica de inmediato. Comunquese con el servicio de emergencias de su localidad (911 en los [place]). No conduzca por sus propios medios [employer] hospital. Resumen  Las ronchas (urticaria) son reas enrojecidas e hinchadas en la piel que ocasionan picazn. Las ronchas provienen de la reaccin del organismo a algo a lo que la persona es Counselling psychologist (alrgeno), algo que causa irritacin o varios otros factores desencadenantes.  El tratamiento de esta afeccin depende de la causa y la  gravedad de los sntomas.  Evite las sustancias que causan las ronchas. Lleve un registro para Presenter, broadcasting de aquello que le causa ronchas.  Tome y [employer] medicamentos de venta libre y los recetados solamente como se lo haya indicado el mdico.  Oceanographer a todas las visitas de seguimiento como se lo haya indicado el mdico. Esto es importante. Esta informacin no tiene Theme park manager el consejo del mdico. Asegrese de hacerle al mdico cualquier pregunta que tenga. Document Revised: 05/03/2018 Document Reviewed: 05/03/2018 Elsevier Patient Education  2021 [employer].

## 2020-10-28 NOTE — Progress Notes (Signed)
Has red patches on skin. Swelling in hands and feet since Thursday.

## 2020-10-28 NOTE — Progress Notes (Signed)
Subjective:  Patient ID: Karina Murphy, female    DOB: Jan 09, 1981  Age: 40 y.o. MRN: [id number]  CC: Hand Pain   HPI Cambry Deng presents for an acute visit.  5 days ago she developed red patches on on her left forearm and anterior chest and the tips of her fingers were red and swollen. She has no known allergies besides pineapple. She works at [employer] and 1 week ago she used a Hydrographic surveyor for dishes and noticed some itching in her hands after she did.  She discussed this with her manager and did not use that product again.  Denies presence of lip swelling or dyspnea and has no fever.  Past Medical History:  Diagnosis Date  . Anxiety   . Arthritis, rheumatoid (HCC)   . Chronic headaches   . GERD (gastroesophageal reflux disease)   . Hypertension   . Urticaria     Past Surgical History:  Procedure Laterality Date  . NO PAST SURGERIES      Family History  Problem Relation Age of Onset  . Diabetes Mother   . Hypertension Mother   . Stomach cancer Neg Hx   . Colon cancer Neg Hx     Allergies  Allergen Reactions  . Pineapple Itching    Outpatient Medications Prior to Visit  Medication Sig Dispense Refill  . albuterol (VENTOLIN HFA) 108 (90 Base) MCG/ACT inhaler Inhale 2 puffs into the lungs every 6 (six) hours as needed for wheezing or shortness of breath. (Patient not taking: Reported on 10/28/2020) 18 g 0  . ibuprofen (ADVIL) 600 MG tablet Take 1 tablet (600 mg total) by mouth every 8 (eight) hours as needed for mild pain or moderate pain. (Patient not taking: Reported on 10/28/2020) 60 tablet 1  . naproxen sodium (ALEVE) 220 MG tablet Take 440 mg by mouth 2 (two) times daily as needed (pain). (Patient not taking: Reported on 10/28/2020)    . sertraline (ZOLOFT) 25 MG tablet Take 1 tablet (25 mg total) by mouth daily. (Patient not taking: Reported on 07/31/2020) 90 tablet 1   No facility-administered medications prior to visit.     ROS Review of  Systems  Constitutional: Negative for activity change, appetite change and fatigue.  HENT: Negative for congestion, sinus pressure and sore throat.   Eyes: Negative for visual disturbance.  Respiratory: Negative for cough, chest tightness, shortness of breath and wheezing.   Cardiovascular: Negative for chest pain and palpitations.  Gastrointestinal: Negative for abdominal distention, abdominal pain and constipation.  Endocrine: Negative for polydipsia.  Genitourinary: Negative for dysuria and frequency.  Musculoskeletal: Negative for arthralgias and back pain.  Skin: Negative for rash.  Neurological: Negative for tremors, light-headedness and numbness.  Hematological: Does not bruise/bleed easily.  Psychiatric/Behavioral: Negative for agitation and behavioral problems.    Objective:  BP 121/70   Pulse 78   Ht 5\' 4"  (1.626 m)   Wt 197 lb (89.4 kg)   SpO2 100%   BMI 33.81 kg/m   BP/Weight 10/28/2020 08/25/2020 08/05/2020  Systolic BP 121 114 111  Diastolic BP 70 75 67  Wt. (Lbs) 197 198 -  BMI 33.81 33.99 -      Physical Exam Constitutional:      Appearance: She is well-developed.  Neck:     Vascular: No JVD.  Cardiovascular:     Rate and Rhythm: Normal rate.     Heart sounds: Normal heart sounds. No murmur heard.   Pulmonary:     Effort: Pulmonary effort  is normal.     Breath sounds: Normal breath sounds. No wheezing or rales.  Chest:     Chest wall: No tenderness.  Abdominal:     General: Bowel sounds are normal. There is no distension.     Palpations: Abdomen is soft. There is no mass.     Tenderness: There is no abdominal tenderness.  Musculoskeletal:        General: Normal range of motion.     Right lower leg: No edema.     Left lower leg: No edema.  Skin:    Comments: Urticarial rash on flexor aspect of left forearm with erythema of anterior chest and base of neck, few nodules with no discharge  Neurological:     Mental Status: She is alert and oriented  to person, place, and time.  Psychiatric:        Mood and Affect: Mood normal.     CMP Latest Ref Rng & Units 07/07/2020 03/19/2020 08/27/2019  Glucose 70 - 99 mg/dL 94 84 86  BUN 6 - 20 mg/dL 13 19 14   Creatinine 0.44 - 1.00 mg/dL 1.91 4.78  Sodium 135 - 145 mmol/L 140 139 141  Potassium 3.5 - 5.1 mmol/L 3.6 4.1 4.6  Chloride 98 - 111 mmol/L 105 104 103  CO2 22 - 32 mmol/L 25 22 25   Calcium 8.9 - 10.3 mg/dL 9.6 9.3 9.5  Total Protein 6.5 - 8.1 g/dL 8.1 7.2 7.2  Total Bilirubin 0.3 - 1.2 mg/dL 0.6 0.5 0.7  Alkaline Phos 38 - 126 U/L 58 71 70  AST 15 - 41 U/L 14(L) 12 13  ALT 0 - 44 U/L 11 10 8     Lipid Panel     Component Value Date/Time   CHOL 138 08/27/2019 1216   TRIG 116 08/27/2019 1216   HDL 40 08/27/2019 1216   CHOLHDL 3.5 08/27/2019 1216   LDLCALC 77 08/27/2019 1216    CBC    Component Value Date/Time   WBC 4.8 07/07/2020 1920   RBC 4.66 07/07/2020 1920   HGB 13.1 07/07/2020 1920   HGB 11.7 03/19/2020 1659   HCT 41.3 07/07/2020 1920   HCT 37.3 03/19/2020 1659   PLT 340 07/07/2020 1920   PLT 303 03/19/2020 1659   MCV 88.6 07/07/2020 1920   MCV 88 03/19/2020 1659   MCH 28.1 07/07/2020 1920   MCHC 31.7 07/07/2020 1920   RDW 13.4 07/07/2020 1920   RDW 12.7 03/19/2020 1659   LYMPHSABS 2.1 12/05/2017 2011   LYMPHSABS 1.9 01/06/2017 1706   MONOABS 0.4 12/05/2017 2011   EOSABS 0.1 12/05/2017 2011   EOSABS 0.3 01/06/2017 1706   BASOSABS 0.0 12/05/2017 2011   BASOSABS 0.0 01/06/2017 1706    Lab Results  Component Value Date   HGBA1C 5.6 03/19/2020    Assessment & Plan:  1. Allergic reaction, initial encounter - predniSONE (DELTASONE) 20 MG tablet; Take 1 tablet (20 mg total) by mouth daily with breakfast.  Dispense: 5 tablet; Refill: 0 - diphenhydrAMINE-zinc acetate (BENADRYL EXTRA STRENGTH) cream; Apply 1 application topically 3 (three) times daily as needed for itching.  Dispense: 28.4 g; Refill: 0    Meds ordered this encounter  Medications   . predniSONE (DELTASONE) 20 MG tablet    Sig: Take 1 tablet (20 mg total) by mouth daily with breakfast.    Dispense:  5 tablet    Refill:  0  . diphenhydrAMINE-zinc acetate (BENADRYL EXTRA STRENGTH) cream    Sig: Apply 1 application  topically 3 (three) times daily as needed for itching.    Dispense:  28.4 g    Refill:  0    Follow-up: Return if symptoms worsen or fail to improve.       Hoy Register, MD, FAAFP. [facility] [place], [place] [phone]   10/28/2020, 1:10 PM

## 2020-11-03 ENCOUNTER — Other Ambulatory Visit: Payer: Self-pay

## 2020-11-03 ENCOUNTER — Ambulatory Visit: Payer: Self-pay | Attending: Nurse Practitioner

## 2020-12-12 ENCOUNTER — Emergency Department (HOSPITAL_COMMUNITY): Payer: Self-pay

## 2020-12-12 ENCOUNTER — Emergency Department (HOSPITAL_COMMUNITY)
Admission: EM | Admit: 2020-12-12 | Discharge: 2020-12-13 | Disposition: A | Discharge status: Home / Self Care | Payer: Self-pay | Attending: Emergency Medicine | Admitting: Emergency Medicine

## 2020-12-12 ENCOUNTER — Encounter (HOSPITAL_COMMUNITY): Payer: Self-pay | Admitting: Medical Oncology

## 2020-12-12 ENCOUNTER — Other Ambulatory Visit: Payer: Self-pay

## 2020-12-12 ENCOUNTER — Ambulatory Visit (HOSPITAL_COMMUNITY)
Admission: EM | Admit: 2020-12-12 | Discharge: 2020-12-12 | Disposition: A | Discharge status: Home / Self Care | Payer: Self-pay | Attending: Medical Oncology | Admitting: Medical Oncology

## 2020-12-12 DIAGNOSIS — R110 Nausea: Secondary | ICD-10-CM | POA: Insufficient documentation

## 2020-12-12 DIAGNOSIS — K219 Gastro-esophageal reflux disease without esophagitis: Secondary | ICD-10-CM | POA: Insufficient documentation

## 2020-12-12 DIAGNOSIS — R1013 Epigastric pain: Secondary | ICD-10-CM | POA: Insufficient documentation

## 2020-12-12 DIAGNOSIS — R519 Headache, unspecified: Secondary | ICD-10-CM

## 2020-12-12 DIAGNOSIS — R112 Nausea with vomiting, unspecified: Secondary | ICD-10-CM

## 2020-12-12 DIAGNOSIS — R42 Dizziness and giddiness: Secondary | ICD-10-CM

## 2020-12-12 DIAGNOSIS — R109 Unspecified abdominal pain: Secondary | ICD-10-CM

## 2020-12-12 DIAGNOSIS — K922 Gastrointestinal hemorrhage, unspecified: Secondary | ICD-10-CM

## 2020-12-12 DIAGNOSIS — R197 Diarrhea, unspecified: Secondary | ICD-10-CM

## 2020-12-12 DIAGNOSIS — R1110 Vomiting, unspecified: Secondary | ICD-10-CM

## 2020-12-12 DIAGNOSIS — I10 Essential (primary) hypertension: Secondary | ICD-10-CM | POA: Insufficient documentation

## 2020-12-12 LAB — COMPREHENSIVE METABOLIC PANEL
ALT: 15 U/L (ref 0–44)
AST: 17 U/L (ref 15–41)
Albumin: 3.8 g/dL (ref 3.5–5.0)
Alkaline Phosphatase: 61 U/L (ref 38–126)
Anion gap: 7 (ref 5–15)
BUN: 12 mg/dL (ref 6–20)
CO2: 25 mmol/L (ref 22–32)
Calcium: 9.1 mg/dL (ref 8.9–10.3)
Chloride: 105 mmol/L (ref 98–111)
Creatinine, Ser: 0.75 mg/dL (ref 0.44–1.00)
GFR, Estimated: >60 mL/min (ref >60)
Glucose, Bld: 87 mg/dL (ref 70–99)
Potassium: 3.4 mmol/L — ABNORMAL LOW (ref 3.5–5.1)
Sodium: 137 mmol/L (ref 135–145)
Total Bilirubin: 1.6 mg/dL — ABNORMAL HIGH (ref 0.3–1.2)
Total Protein: 7.5 g/dL (ref 6.5–8.1)

## 2020-12-12 LAB — CBC WITH DIFFERENTIAL/PLATELET
Abs Immature Granulocytes: 0.02 10*3/uL (ref 0.00–0.07)
Basophils Absolute: 0 10*3/uL (ref 0.0–0.1)
Basophils Relative: 1 %
Eosinophils Absolute: 0.1 10*3/uL (ref 0.0–0.5)
Eosinophils Relative: 2 %
HCT: 37.1 % (ref 36.0–46.0)
Hemoglobin: 11.7 g/dL — ABNORMAL LOW (ref 12.0–15.0)
Immature Granulocytes: 0 %
Lymphocytes Relative: 35 %
Lymphs Abs: 2.2 10*3/uL (ref 0.7–4.0)
MCH: 28.1 pg (ref 26.0–34.0)
MCHC: 31.5 g/dL (ref 30.0–36.0)
MCV: 89 fL (ref 80.0–100.0)
Monocytes Absolute: 0.5 10*3/uL (ref 0.1–1.0)
Monocytes Relative: 8 %
Neutro Abs: 3.4 10*3/uL (ref 1.7–7.7)
Neutrophils Relative %: 54 %
Platelets: 311 10*3/uL (ref 150–400)
RBC: 4.17 MIL/uL (ref 3.87–5.11)
RDW: 13.5 % (ref 11.5–15.5)
WBC: 6.3 10*3/uL (ref 4.0–10.5)
nRBC: 0 % (ref 0.0–0.2)

## 2020-12-12 LAB — CBG MONITORING, ED: Glucose-Capillary: 74 mg/dL (ref 70–99)

## 2020-12-12 LAB — LIPASE, BLOOD: Lipase: 34 U/L (ref 11–51)

## 2020-12-12 LAB — ETHANOL: Alcohol, Ethyl (B): <10 mg/dL (ref <10)

## 2020-12-12 MED ORDER — SODIUM CHLORIDE 0.9 % IV BOLUS
1000.0000 mL | Freq: Once | INTRAVENOUS | Status: AC
  Administered 2020-12-13: 1000 mL via INTRAVENOUS

## 2020-12-12 MED ORDER — ONDANSETRON HCL 4 MG/2ML IJ SOLN: 4.0000 mg | Freq: Once | INTRAMUSCULAR | Status: DC

## 2020-12-12 NOTE — ED Provider Notes (Signed)
[facility]    CSN: [id number] Arrival date & time: 12/12/20  1508      History   Chief Complaint Chief Complaint  Patient presents with  . Emesis  . Headache  . Abdominal Pain    HPI Karina Murphy is a 40 y.o. female. Karina Murphy medical Spanish interpretor assisted with our visit today.   HPI  Emesis: Patient reports that for the past week she has not been feeling well.  She has had watery stools, abdominal pain and headache.  Yesterday she vomited and had some spots of blood in her vomit.  Today she is not able to hold down any fluids or food and has vomited with every attempt to eat or drink.  She also notes that her diarrhea has become black in nature with a few speckles of blood.  She has had nosebleeds as well.  She denies any bleeding from the gums or other bleeding episodes.  She states that she has also been very thirsty and has been urinating frequently.  No known fevers, no dysuria or vaginal discharge.  She has not tried anything for symptoms.   Past Medical History:  Diagnosis Date  . Anxiety   . Arthritis, rheumatoid (HCC)   . Chronic headaches   . GERD (gastroesophageal reflux disease)   . Hypertension   . Urticaria     Patient Active Problem List   Diagnosis Date Noted  . Biliary colic   . 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  . Weakness 11/23/2016  . Breast pain, left 09/17/2016  . Abdominal pain, epigastric 07/27/2016  . Rheumatoid arthritis (HCC) 02/12/2014  . Obese 12/26/2013    Past Surgical History:  Procedure Laterality Date  . NO PAST SURGERIES      OB History   No obstetric history on file.      Home Medications    Prior to Admission medications   Medication Sig Start Date End Date Taking? Authorizing Provider  albuterol (VENTOLIN HFA) 108 (90 Base) MCG/ACT inhaler Inhale 2 puffs into the lungs every 6 (six) hours as needed for wheezing or shortness of breath. Patient not taking: No sig reported 09/24/19    Claiborne Rigg, NP  diphenhydrAMINE-zinc acetate (BENADRYL EXTRA STRENGTH) cream Apply 1 application topically 3 (three) times daily as needed for itching. 10/28/20   Hoy Register, MD  predniSONE (DELTASONE) 20 MG tablet Take 1 tablet (20 mg total) by mouth daily with breakfast. 10/28/20   Hoy Register, MD  sertraline (ZOLOFT) 25 MG tablet Take 1 tablet (25 mg total) by mouth daily. Patient not taking: Reported on 07/31/2020 03/14/20 12/12/20  Claiborne Rigg, NP    Family History Family History  Problem Relation Age of Onset  . Diabetes Mother   . Hypertension Mother   . Stomach cancer Neg Hx   . Colon cancer Neg Hx     Social History Social History   Tobacco Use  . Smoking status: Never Smoker  . Smokeless tobacco: Never Used  Vaping Use  . Vaping Use: Never used  Substance Use Topics  . Alcohol use: No  . Drug use: No     Allergies   Pineapple   Review of Systems Review of Systems  As stated above in HPI Physical Exam Triage Vital Signs ED Triage Vitals  Enc Vitals Group     BP 12/12/20 1637 122/80     Pulse Rate 12/12/20 1637 66     Resp 12/12/20 1637 19  Temp 12/12/20 1637 98.6 F (37 C)     Temp Source 12/12/20 1637 Oral     SpO2 12/12/20 1637 99 %     Weight --      Height --      Head Circumference --      Peak Flow --      Pain Score 12/12/20 1636 6     Pain Loc --      Pain Edu? --      Excl. in GC? --    No data found.  Updated Vital Signs BP 122/80 (BP Location: Left Arm)   Pulse 66   Temp 98.6 F (37 C) (Oral)   Resp 19   SpO2 99%   Physical Exam Vitals and nursing note reviewed.  Constitutional:      General: She is not in acute distress.    Appearance: She is well-developed. She is ill-appearing. She is not toxic-appearing or diaphoretic.     Comments: Patient became dizzy after laying supine to sitting  HENT:     Head: Normocephalic and atraumatic.     Mouth/Throat:     Pharynx: Oropharynx is clear.     Comments: No  bleeding of the gums Eyes:     Comments: No pallor  Neck:     Meningeal: Kernig's sign absent.  Cardiovascular:     Rate and Rhythm: Normal rate and regular rhythm.  Pulmonary:     Effort: Pulmonary effort is normal.     Breath sounds: Normal breath sounds.  Abdominal:     General: Bowel sounds are normal.     Palpations: Abdomen is soft.     Tenderness: There is abdominal tenderness in the right lower quadrant. There is guarding and rebound. There is no right CVA tenderness or left CVA tenderness. Positive signs include psoas sign.     Hernia: No hernia is present.  Musculoskeletal:     Cervical back: Rigidity (scant) present.  Lymphadenopathy:     Cervical: No cervical adenopathy.  Skin:    General: Skin is warm and dry.  Neurological:     Mental Status: She is alert and oriented to person, place, and time.  Psychiatric:        Mood and Affect: Mood normal.        Speech: Speech normal.        Behavior: Behavior normal.      UC Treatments / Results  Labs (all labs ordered are listed, but only abnormal results are displayed) Labs Reviewed  CBG MONITORING, ED    EKG   Radiology No results found.  Procedures Procedures (including critical care time)  Medications Ordered in UC Medications - No data to display  Initial Impression / Assessment and Plan / UC Course  I have reviewed the triage vital signs and the nursing notes.  Pertinent labs & imaging results that were available during my care of the patient were reviewed by me and considered in my medical decision making (see chart for details).     New.  Her symptoms are very concerning.  She is concerning for a multitude of ailments with high morbidity mortality rates including GI bleed, appendicitis, meningitis.  I discussed my concerns with patient.  My recommendation is for her to go to the emergency room via EMS however she has a friend who brought her today and would like to travel via private vehicle  across our parking lot to the emergency room.  As she appears cardiovascularily stable I  am agreeable with this plan.    Final Clinical Impressions(s) / UC Diagnoses   Final diagnoses:  Nausea vomiting and diarrhea  Gastrointestinal hemorrhage, unspecified gastrointestinal hemorrhage type  Dizziness and giddiness  Acute intractable headache, unspecified headache type     Discharge Instructions     Please have your friend take you to the emergency room     ED Prescriptions    None     PDMP not reviewed this encounter.   Rushie Chestnut, [place] 12/12/20 [phone]

## 2020-12-12 NOTE — ED Provider Notes (Signed)
MSE was initiated and I personally evaluated the patient and placed orders (if any) at  6:31 PM on December 12, 2020.  The patient appears stable so that the remainder of the MSE may be completed by another provider.  Patient w Hx of chole 28m pta now w one week of epigastric and RUQ pain w n/v/d.  She is taking naproxen nightly. No e/o distress in triage.   Gerhard Munch, MD 12/12/20 [phone]

## 2020-12-12 NOTE — Discharge Instructions (Addendum)
Please have your friend take you to the emergency room

## 2020-12-12 NOTE — ED Triage Notes (Signed)
Pt reports diarrhea, abdominal pain, headaches x 1 week. Reports some blood spot in the vomit today and nosebleed.

## 2020-12-12 NOTE — ED Triage Notes (Signed)
Pt reports upper gastric abdominal pain with nausea vomiting and diarrhea x 1 week

## 2020-12-13 ENCOUNTER — Emergency Department (HOSPITAL_COMMUNITY): Payer: Self-pay

## 2020-12-13 MED ORDER — ONDANSETRON 4 MG PO TBDP: 4.0000 mg | ORAL_TABLET | Freq: Three times a day (TID) | ORAL | 0 refills | Status: DC | PRN

## 2020-12-13 MED ORDER — SODIUM CHLORIDE 0.9 % IV BOLUS
500.0000 mL | Freq: Once | INTRAVENOUS | Status: AC
  Administered 2020-12-13: 500 mL via INTRAVENOUS

## 2020-12-13 MED ORDER — ONDANSETRON HCL 4 MG/2ML IJ SOLN
4.0000 mg | Freq: Once | INTRAMUSCULAR | Status: AC
  Administered 2020-12-13: 4 mg via INTRAVENOUS
  Filled 2020-12-13: qty 2

## 2020-12-13 MED ORDER — IOHEXOL 300 MG/ML  SOLN
100.0000 mL | Freq: Once | INTRAMUSCULAR | Status: AC | PRN
  Administered 2020-12-13: 100 mL via INTRAVENOUS

## 2020-12-13 MED ORDER — DICYCLOMINE HCL 20 MG PO TABS: 20.0000 mg | ORAL_TABLET | Freq: Three times a day (TID) | ORAL | 0 refills | Status: DC | PRN

## 2020-12-13 MED ORDER — PANTOPRAZOLE SODIUM 40 MG IV SOLR
40.0000 mg | Freq: Once | INTRAVENOUS | Status: AC
  Administered 2020-12-13: 40 mg via INTRAVENOUS
  Filled 2020-12-13: qty 40

## 2020-12-13 NOTE — ED Notes (Signed)
Pt reports nausea and worsening abdominal pain since pt ate 2 crackers and drank sprite. MD notified.

## 2020-12-13 NOTE — ED Notes (Signed)
PO challenge initiated with sprite and crackers. Pt asked for a [place] sandwich as well. Will continue to monitor.

## 2020-12-13 NOTE — Discharge Instructions (Signed)
Please follow with your PCP and Gastroenterology doctor listed on this form.    Contine con su PCP y su mdico gastroenterlogo que figuran en este formulario.

## 2020-12-13 NOTE — ED Provider Notes (Signed)
Emergency Department Provider Note   I have reviewed the triage vital signs and the nursing notes.   HISTORY  Chief Complaint Abdominal Pain and Nausea   HPI Karina Murphy is a 40 y.o. female presents to the emergency department for evaluation of epigastric abdominal pain with vomiting.  She had symptoms began yesterday.  She notes that she had some blood streaks in her vomit along with pain.  She initially presented to urgent care but was redirected to the emergency department with complaint of hematemesis.  She has not had additional vomiting while in the waiting room.  She is not having chest pain or shortness of breath.  No fevers or chills.  She does have history of prior cholecystectomy.   Video Spanish interpreter was used for this encounter.   Past Medical History:  Diagnosis Date  . Anxiety   . Arthritis, rheumatoid (HCC)   . Chronic headaches   . GERD (gastroesophageal reflux disease)   . Hypertension   . Urticaria     Patient Active Problem List   Diagnosis Date Noted  . Biliary colic   . 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  . Weakness 11/23/2016  . Breast pain, left 09/17/2016  . Abdominal pain, epigastric 07/27/2016  . Rheumatoid arthritis (HCC) 02/12/2014  . Obese 12/26/2013    Past Surgical History:  Procedure Laterality Date  . NO PAST SURGERIES      Allergies Pineapple  Family History  Problem Relation Age of Onset  . Diabetes Mother   . Hypertension Mother   . Stomach cancer Neg Hx   . Colon cancer Neg Hx     Social History Social History   Tobacco Use  . Smoking status: Never Smoker  . Smokeless tobacco: Never Used  Vaping Use  . Vaping Use: Never used  Substance Use Topics  . Alcohol use: No  . Drug use: No    Review of Systems  Constitutional: No fever/chills Eyes: No visual changes. ENT: No sore throat. Cardiovascular: Denies chest pain. Respiratory: Denies shortness of breath. Gastrointestinal:  Positive epigastric abdominal pain. Positive nausea and vomiting.  No diarrhea.  No constipation. Genitourinary: Negative for dysuria. Musculoskeletal: Negative for back pain. Skin: Negative for rash. Neurological: Negative for headaches, focal weakness or numbness.  10-point ROS otherwise negative.  ____________________________________________   PHYSICAL EXAM:  VITAL SIGNS: ED Triage Vitals  Enc Vitals Group     BP 12/12/20 1814 (!) 119/57     Pulse Rate 12/12/20 1814 63     Resp 12/12/20 1814 18     Temp 12/12/20 1814 99.4 F (37.4 C)     Temp Source 12/12/20 1814 Oral     SpO2 12/12/20 1814 99 %   Constitutional: Alert and oriented. Well appearing and in no acute distress. Eyes: Conjunctivae are normal.  Head: Atraumatic. Nose: No congestion/rhinnorhea. Mouth/Throat: Mucous membranes are moist. Neck: No stridor.   Cardiovascular: Normal rate, regular rhythm. Good peripheral circulation. Grossly normal heart sounds.   Respiratory: Normal respiratory effort.  No retractions. Lungs CTAB. Gastrointestinal: Soft with mid-epigastric tenderness primarily. No lower abdominal tenderness. No rebound or guarding. No distention.  Musculoskeletal: No lower extremity tenderness nor edema. No gross deformities of extremities. Neurologic:  Normal speech and language. No gross focal neurologic deficits are appreciated.  Skin:  Skin is warm, dry and intact. No rash noted. ____________________________________________   LABS (all labs ordered are listed, but only abnormal results are displayed)  Labs Reviewed  COMPREHENSIVE METABOLIC PANEL - Abnormal;  Notable for the following components:      Result Value   Potassium 3.4 (*)    Total Bilirubin 1.6 (*)    All other components within normal limits  CBC WITH DIFFERENTIAL/PLATELET - Abnormal; Notable for the following components:   Hemoglobin 11.7 (*)    All other components within normal limits  ETHANOL  LIPASE, BLOOD    ____________________________________________  RADIOLOGY  DG Chest 2 View  Result Date: 12/13/2020 CLINICAL DATA:  Hematemesis.  Nausea. EXAM: CHEST - 2 VIEW COMPARISON:  11/09/2018 FINDINGS: Lungs are adequately inflated and otherwise clear. Cardiomediastinal silhouette and remainder of the exam is unchanged. IMPRESSION: No active cardiopulmonary disease. Electronically Signed   By: Elberta Fortis M.D.   On: 12/13/2020 09:09   CT ABDOMEN PELVIS W CONTRAST  Result Date: 12/13/2020 CLINICAL DATA:  Abdominal pain with nausea and vomiting as well as diarrhea 1 week. Previous cholecystectomy 5 months ago. EXAM: CT ABDOMEN AND PELVIS WITH CONTRAST TECHNIQUE: Multidetector CT imaging of the abdomen and pelvis was performed using the standard protocol following bolus administration of intravenous contrast. CONTRAST:  OMNIPAQUE IOHEXOL 300 MG/ML  SOLN COMPARISON:  06/16/2017 FINDINGS: Lower chest: Lung bases are clear. Hepatobiliary: Previous cholecystectomy. Liver and biliary tree are normal. Pancreas: Normal. Spleen: Normal. Adrenals/Urinary Tract: Adrenal glands are normal. Kidneys are normal size without hydronephrosis or nephrolithiasis. Ureters and bladder are normal. Stomach/Bowel: Stomach and small bowel are normal. Appendix is normal. Colon is normal. Vascular/Lymphatic: Normal. Reproductive: Normal. Other: No free fluid or focal inflammatory change. Musculoskeletal: Stable degenerative changes/sclerosis of the sacroiliac joints. IMPRESSION: No acute findings in the abdomen/pelvis. Electronically Signed   By: Elberta Fortis M.D.   On: 12/13/2020 10:03    ____________________________________________   PROCEDURES  Procedure(s) performed:   Procedures  None  ____________________________________________   INITIAL IMPRESSION / ASSESSMENT AND PLAN / ED COURSE  Pertinent labs & imaging results that were available during my care of the patient were reviewed by me and considered in my  medical decision making (see chart for details).   Patient presents emergency department with epigastric abdominal pain with vomiting and concern for hematemesis.  No chest pain or shortness of breath.  Chest x-ray obtained to evaluate primarily the mediastinum and for infiltrate and no abnormality is seen.  Patient does have some mild epigastric tenderness.  Right upper quadrant ultrasound was ordered prior to my evaluation which shows history of a prior cholecystectomy and some hepatic steatosis but no acute finding.  Follow this with CT abdomen pelvis given her history of hematemesis.  No esophageal or other perforation.  Plan for PO challenge and reassess.   CT imaging and ultrasound reviewed.  No acute findings to explain the patient's symptoms.  Suspect gastritis clinically.  She is treated symptomatically in the emergency department and improved.  Lab work reviewed with no acute findings and patient is tolerating PO in the ED. plan for PCP and GI follow-up.  Contact information provided at discharge.  Discussed impression and plan using the Spanish interpreter. ____________________________________________  FINAL CLINICAL IMPRESSION(S) / ED DIAGNOSES  Final diagnoses:  Epigastric pain  Nausea     MEDICATIONS GIVEN DURING THIS VISIT:  Medications  sodium chloride 0.9 % bolus 1,000 mL (0 mLs Intravenous Stopped 12/13/20 1135)  sodium chloride 0.9 % bolus 500 mL (0 mLs Intravenous Stopped 12/13/20 0932)  pantoprazole (PROTONIX) injection 40 mg (40 mg Intravenous Given 12/13/20 0828)  ondansetron (ZOFRAN) injection 4 mg (4 mg Intravenous Given 12/13/20 0828)  iohexol (  OMNIPAQUE) 300 MG/ML solution 100 mL (100 mLs Intravenous Contrast Given 12/13/20 0951)     NEW OUTPATIENT MEDICATIONS STARTED DURING THIS VISIT:  Discharge Medication List as of 12/13/2020 12:11 PM    START taking these medications   Details  dicyclomine (BENTYL) 20 MG tablet Take 1 tablet (20 mg total) by mouth 3 (three) times  daily as needed for spasms., Starting Sat 12/13/2020, Print    ondansetron (ZOFRAN ODT) 4 MG disintegrating tablet Take 1 tablet (4 mg total) by mouth every 8 (eight) hours as needed for nausea or vomiting., Starting Sat 12/13/2020, Print        Note:  This document was prepared using Dragon voice recognition software and may include unintentional dictation errors.  Alona Bene, MD, [facility] Emergency Medicine    Jowanda Heeg, Arlyss Repress, MD 12/14/20 [phone]

## 2020-12-23 ENCOUNTER — Other Ambulatory Visit: Payer: Self-pay | Admitting: Nurse Practitioner

## 2020-12-23 DIAGNOSIS — J4520 Mild intermittent asthma, uncomplicated: Secondary | ICD-10-CM

## 2020-12-23 DIAGNOSIS — T7840XA Allergy, unspecified, initial encounter: Secondary | ICD-10-CM

## 2020-12-23 MED ORDER — ALBUTEROL SULFATE HFA 108 (90 BASE) MCG/ACT IN AERS
2.0000 | INHALATION_SPRAY | Freq: Four times a day (QID) | RESPIRATORY_TRACT | 2 refills | Status: DC | PRN
  Filled 2020-12-23: qty 18, 25d supply, fill #0

## 2020-12-23 NOTE — Telephone Encounter (Signed)
Requested medication (s) are due for refill today - unsure  Requested medication (s) are on the active medication list - yes  Future visit scheduled -no  Last refill: over 1 year- inhaler expired Rx, other - 1 month ago- acute visit  Notes to clinic: Patient is requesting RF of expired Rx and RF of 2 medications prescribed for acute problem. ( discontinued med-error on one medication) Rx request RF sent for review   Requested Prescriptions  Pending Prescriptions Disp Refills   albuterol (VENTOLIN HFA) 108 (90 Base) MCG/ACT inhaler 18 g 0    Sig: Inhale 2 puffs into the lungs every 6 (six) hours as needed for wheezing or shortness of breath.      Pulmonology:  Beta Agonists Failed - 12/23/2020  2:23 PM      Failed - One inhaler should last at least one month. If the patient is requesting refills earlier, contact the patient to check for uncontrolled symptoms.      Passed - Valid encounter within last 12 months    Recent Outpatient Visits           1 month ago Allergic reaction, initial encounter   [facility] Hoy Register, MD   5 months ago RUQ pain   [facility] [place], Marylene Land M, [place]   9 months ago Rheumatoid arthritis involving multiple sites with positive rheumatoid factor [facility])   [place] [facility] [place], Shea Stakes, NP   1 year ago Gastroesophageal reflux disease without esophagitis   [facility] [place], [place] W, NP   1 year ago Gastroesophageal reflux disease without esophagitis   [facility] [place], [place] W, NP                  predniSONE (DELTASONE) 20 MG tablet 5 tablet 0    Sig: TAKE 1 TABLET (20 MG TOTAL) BY MOUTH DAILY WITH BREAKFAST.      Not Delegated - Endocrinology:  Oral Corticosteroids Failed - 12/23/2020  2:23 PM      Failed - This refill cannot be delegated      Passed - Last BP in normal range    BP  Readings from Last 1 Encounters:  12/13/20 106/76          Passed - Valid encounter within last 6 months    Recent Outpatient Visits           1 month ago Allergic reaction, initial encounter   [facility] [place], Odette Horns, MD   5 months ago RUQ pain   [facility] [place], Marylene Land M, [place]   9 months ago Rheumatoid arthritis involving multiple sites with positive rheumatoid factor [facility])   [place] [facility] , Shea Stakes, NP   1 year ago Gastroesophageal reflux disease without esophagitis   [place] [facility] [place], [place] W, NP   1 year ago Gastroesophageal reflux disease without esophagitis    [facility] [place], [place] W, NP                  diphenhydrAMINE-zinc acetate (BENADRYL EXTRA STRENGTH) cream 28.4 g 0    Sig: Apply 1 application topically 3 (three) times daily as needed for itching.      Off-Protocol Failed - 12/23/2020  2:23 PM      Failed - Medication not  assigned to a protocol, review manually.      Passed - Valid encounter within last 12 months    Recent Outpatient Visits           1 month ago Allergic reaction, initial encounter   [facility] [place], [place], MD   5 months ago RUQ pain   [facility] [place], Marylene Land M, [place]   9 months ago Rheumatoid arthritis involving multiple sites with positive rheumatoid factor [facility]   [place] [facility] [place], [place] W, NP   1 year ago Gastroesophageal reflux disease without esophagitis   [place] [place], [place] W, NP   1 year ago Gastroesophageal reflux disease without esophagitis   [facility] [place], Shea Stakes, NP                    Requested Prescriptions  Pending Prescriptions Disp Refills   albuterol (VENTOLIN HFA) 108  (90 Base) MCG/ACT inhaler 18 g 0    Sig: Inhale 2 puffs into the lungs every 6 (six) hours as needed for wheezing or shortness of breath.      Pulmonology:  Beta Agonists Failed - 12/23/2020  2:23 PM      Failed - One inhaler should last at least one month. If the patient is requesting refills earlier, contact the patient to check for uncontrolled symptoms.      Passed - Valid encounter within last 12 months    Recent Outpatient Visits           1 month ago Allergic reaction, initial encounter   [facility] Hoy Register, MD   5 months ago RUQ pain   [facility] [place], Marylene Land M, [place]   9 months ago Rheumatoid arthritis involving multiple sites with positive rheumatoid factor [facility])   [place] [facility] [place], Shea Stakes, NP   1 year ago Gastroesophageal reflux disease without esophagitis   [place] [facility] [place], [place] W, NP   1 year ago Gastroesophageal reflux disease without esophagitis   [place] [place], [place] W, NP                  predniSONE (DELTASONE) 20 MG tablet 5 tablet 0    Sig: TAKE 1 TABLET (20 MG TOTAL) BY MOUTH DAILY WITH BREAKFAST.      Not Delegated - Endocrinology:  Oral Corticosteroids Failed - 12/23/2020  2:23 PM      Failed - This refill cannot be delegated      Passed - Last BP in normal range    BP Readings from Last 1 Encounters:  12/13/20 106/76          Passed - Valid encounter within last 6 months    Recent Outpatient Visits           1 month ago Allergic reaction, initial encounter   [facility] [place], Odette Horns, MD   5 months ago RUQ pain   [place] [facility] [place], Marylene Land M, [place]   9 months ago Rheumatoid arthritis involving multiple sites with positive rheumatoid factor [facility])   [facility] [place], Shea Stakes, NP    1 year ago Gastroesophageal reflux disease without esophagitis   [facility] [place], [place],  NP   1 year ago Gastroesophageal reflux disease without esophagitis   [place] [facility] [place], [place] W, NP                  diphenhydrAMINE-zinc acetate (BENADRYL EXTRA STRENGTH) cream 28.4 g 0    Sig: Apply 1 application topically 3 (three) times daily as needed for itching.      Off-Protocol Failed - 12/23/2020  2:23 PM      Failed - Medication not assigned to a protocol, review manually.      Passed - Valid encounter within last 12 months    Recent Outpatient Visits           1 month ago Allergic reaction, initial encounter   [place] Hoy Register, MD   5 months ago RUQ pain   [facility] [place], Marylene Land M, [place]   9 months ago Rheumatoid arthritis involving multiple sites with positive rheumatoid factor [facility])   [facility] [place], Shea Stakes, NP   1 year ago Gastroesophageal reflux disease without esophagitis   [place] [facility] [place], Shea Stakes, NP   1 year ago Gastroesophageal reflux disease without esophagitis   [facility] [place], Shea Stakes, NP

## 2020-12-23 NOTE — Telephone Encounter (Signed)
Copied from CRM [id number]. Topic: Quick Communication - Rx Refill/Question >> Dec 23, 2020  2:04 PM Jaquita Rector A wrote: Medication: albuterol (VENTOLIN HFA) 108 (90 Base) MCG/ACT inhaler, diphenhydrAMINE-zinc acetate (BENADRYL EXTRA STRENGTH) cream, predniSONE (DELTASONE) 20 MG tablet  Has the patient contacted their pharmacy? Yes.   (Agent: If no, request that the patient contact the pharmacy for the refill.) (Agent: If yes, when and what did the pharmacy advise?)  Preferred Pharmacy (with phone number or street name): [facility]  Phone:  [phone] Fax:  [phone]     Agent: Please be advised that RX refills may take up to 3 business days. We ask that you follow-up with your pharmacy.

## 2020-12-24 ENCOUNTER — Other Ambulatory Visit: Payer: Self-pay

## 2020-12-25 ENCOUNTER — Other Ambulatory Visit: Payer: Self-pay

## 2021-01-12 ENCOUNTER — Other Ambulatory Visit: Payer: Self-pay

## 2021-01-12 ENCOUNTER — Ambulatory Visit (HOSPITAL_COMMUNITY)
Admission: EM | Admit: 2021-01-12 | Discharge: 2021-01-12 | Disposition: A | Discharge status: Home / Self Care | Payer: Self-pay | Attending: Family Medicine | Admitting: Family Medicine

## 2021-01-12 ENCOUNTER — Encounter (HOSPITAL_COMMUNITY): Payer: Self-pay

## 2021-01-12 DIAGNOSIS — J101 Influenza due to other identified influenza virus with other respiratory manifestations: Secondary | ICD-10-CM

## 2021-01-12 HISTORY — DX: Obesity, unspecified: E66.9

## 2021-01-12 LAB — POC INFLUENZA A AND B ANTIGEN (URGENT CARE ONLY)
INFLUENZA A ANTIGEN, POC: POSITIVE — AB
INFLUENZA B ANTIGEN, POC: NEGATIVE

## 2021-01-12 MED ORDER — OSELTAMIVIR PHOSPHATE 75 MG PO CAPS
75.0000 mg | ORAL_CAPSULE | Freq: Two times a day (BID) | ORAL | 0 refills | Status: DC
  Filled 2021-01-12: qty 10, 5d supply, fill #0

## 2021-01-12 NOTE — ED Provider Notes (Signed)
[facility]    CSN: [id number] Arrival date & time: 01/12/21  1415      History   Chief Complaint Chief Complaint  Patient presents with  . Cough  . Vaginal Bleeding    HPI Karina Murphy is a 40 y.o. female.   Patient declines medical interpreter today, requesting to use her daughter who is with her today for interpretation purposes.  She states she has had a fever, chills, body aches, cough, congestion, headache, fatigue for the past 2 days.  She denies chest pain, shortness of breath, abdominal pain, nausea vomiting diarrhea.  Daughter was recently sick but did not go to the doctor for testing so unsure which she was ill with.  So far taking Motrin with mild temporary relief of fever and body aches.  No known history of seasonal allergies or pulmonary issues.     Past Medical History:  Diagnosis Date  . Anxiety   . Arthritis, rheumatoid (HCC)   . Chronic headaches   . GERD (gastroesophageal reflux disease)   . Hypertension   . Obesity   . Urticaria     Patient Active Problem List   Diagnosis Date Noted  . Biliary colic   . 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  . Weakness 11/23/2016  . Breast pain, left 09/17/2016  . Abdominal pain, epigastric 07/27/2016  . Rheumatoid arthritis (HCC) 02/12/2014  . Obese 12/26/2013    Past Surgical History:  Procedure Laterality Date  . NO PAST SURGERIES      OB History   No obstetric history on file.      Home Medications    Prior to Admission medications   Medication Sig Start Date End Date Taking? Authorizing Provider  oseltamivir (TAMIFLU) 75 MG capsule Take 1 capsule (75 mg total) by mouth every 12 (twelve) hours. 01/12/21  Yes Particia Nearing, PA-C  albuterol (VENTOLIN HFA) 108 (90 Base) MCG/ACT inhaler Inhale 2 puffs into the lungs every 6 (six) hours as needed for wheezing or shortness of breath. 12/23/20   Hoy Register, MD  dicyclomine (BENTYL) 20 MG tablet Take 1 tablet (20 mg  total) by mouth 3 (three) times daily as needed for spasms. 12/13/20   Long, Arlyss Repress, MD  ondansetron (ZOFRAN ODT) 4 MG disintegrating tablet Take 1 tablet (4 mg total) by mouth every 8 (eight) hours as needed for nausea or vomiting. 12/13/20   Long, Arlyss Repress, MD  predniSONE (DELTASONE) 20 MG tablet TAKE 1 TABLET (20 MG TOTAL) BY MOUTH DAILY WITH BREAKFAST. 10/28/20 10/28/21  Hoy Register, MD  sertraline (ZOLOFT) 25 MG tablet Take 1 tablet (25 mg total) by mouth daily. Patient not taking: Reported on 07/31/2020 03/14/20 12/12/20  Claiborne Rigg, NP    Family History Family History  Problem Relation Age of Onset  . Diabetes Mother   . Hypertension Mother   . Stomach cancer Neg Hx   . Colon cancer Neg Hx     Social History Social History   Tobacco Use  . Smoking status: Never Smoker  . Smokeless tobacco: Never Used  Vaping Use  . Vaping Use: Never used  Substance Use Topics  . Alcohol use: No  . Drug use: No     Allergies   Pineapple   Review of Systems Review of Systems Per HPI  Physical Exam Triage Vital Signs ED Triage Vitals  Enc Vitals Group     BP 01/12/21 1515 100/66     Pulse Rate 01/12/21 1515 95  Resp 01/12/21 1515 18     Temp 01/12/21 1515 99.1 F (37.3 C)     Temp Source 01/12/21 1515 Oral     SpO2 01/12/21 1515 100 %     Weight --      Height --      Head Circumference --      Peak Flow --      Pain Score 01/12/21 1514 0     Pain Loc --      Pain Edu? --      Excl. in GC? --    No data found.  Updated Vital Signs BP 100/66 (BP Location: Right Arm)   Pulse 95   Temp 99.1 F (37.3 C) (Oral)   Resp 18   SpO2 100%   Visual Acuity Right Eye Distance:   Left Eye Distance:   Bilateral Distance:    Right Eye Near:   Left Eye Near:    Bilateral Near:     Physical Exam Vitals and nursing note reviewed.  Constitutional:      Appearance: Normal appearance. She is not ill-appearing.  HENT:     Head: Atraumatic.     Right Ear: Tympanic  membrane normal.     Left Ear: Tympanic membrane normal.     Nose: Rhinorrhea present.     Mouth/Throat:     Mouth: Mucous membranes are moist.     Pharynx: Posterior oropharyngeal erythema present.  Eyes:     Extraocular Movements: Extraocular movements intact.     Conjunctiva/sclera: Conjunctivae normal.  Cardiovascular:     Rate and Rhythm: Normal rate and regular rhythm.     Heart sounds: Normal heart sounds.  Pulmonary:     Effort: Pulmonary effort is normal. No respiratory distress.     Breath sounds: Normal breath sounds. No wheezing or rales.  Abdominal:     General: Bowel sounds are normal. There is no distension.     Palpations: Abdomen is soft.     Tenderness: There is no abdominal tenderness. There is no guarding.  Musculoskeletal:        General: Normal range of motion.     Cervical back: Normal range of motion and neck supple.  Skin:    General: Skin is warm and dry.  Neurological:     Mental Status: She is alert and oriented to person, place, and time.  Psychiatric:        Mood and Affect: Mood normal.        Thought Content: Thought content normal.        Judgment: Judgment normal.      UC Treatments / Results  Labs (all labs ordered are listed, but only abnormal results are displayed) Labs Reviewed  POC INFLUENZA A AND B ANTIGEN (URGENT CARE ONLY) - Abnormal; Notable for the following components:      Result Value   INFLUENZA A ANTIGEN, POC POSITIVE (*)    All other components within normal limits  SARS CORONAVIRUS 2 (TAT 6-24 HRS)    EKG   Radiology No results found.  Procedures Procedures (including critical care time)  Medications Ordered in UC Medications - No data to display  Initial Impression / Assessment and Plan / UC Course  I have reviewed the triage vital signs and the nursing notes.  Pertinent labs & imaging results that were available during my care of the patient were reviewed by me and considered in my medical decision making  (see chart for details).  Rapid flu positive for flu A, discussed starting Tamiflu, continuing over-the-counter pain and fever reducers, Mucinex, DayQuil and NyQuil, supportive home care.  Return for acutely worsening symptoms.  Work note given.  Final Clinical Impressions(s) / UC Diagnoses   Final diagnoses:  Influenza A   Discharge Instructions   None    ED Prescriptions    Medication Sig Dispense Auth. Provider   oseltamivir (TAMIFLU) 75 MG capsule Take 1 capsule (75 mg total) by mouth every 12 (twelve) hours. 10 capsule Particia Nearing, [place]     PDMP not reviewed this encounter.   Particia Nearing, [place] 01/12/21 2110

## 2021-01-12 NOTE — ED Triage Notes (Signed)
Pt is here with a cough that started Saturday night, pt has taken Motrin to relieve discomfort. Pt states she has had some vaginal bleeding that started Saturday too, she thinks its not her normal menstrual.

## 2021-02-11 ENCOUNTER — Other Ambulatory Visit: Payer: Self-pay

## 2021-02-11 ENCOUNTER — Encounter: Payer: Self-pay | Admitting: Nurse Practitioner

## 2021-02-11 ENCOUNTER — Ambulatory Visit: Payer: Self-pay | Attending: Nurse Practitioner | Admitting: Nurse Practitioner

## 2021-02-11 DIAGNOSIS — R7303 Prediabetes: Secondary | ICD-10-CM

## 2021-02-11 DIAGNOSIS — R1084 Generalized abdominal pain: Secondary | ICD-10-CM

## 2021-02-11 DIAGNOSIS — R140 Abdominal distension (gaseous): Secondary | ICD-10-CM

## 2021-02-11 MED ORDER — ONDANSETRON 4 MG PO TBDP
4.0000 mg | ORAL_TABLET | Freq: Three times a day (TID) | ORAL | 0 refills | Status: DC | PRN
  Filled 2021-02-11: qty 30, 10d supply, fill #0

## 2021-02-11 NOTE — Addendum Note (Signed)
Addended byMemory Dance on: 02/11/2021 04:07 PM   Modules accepted: Orders

## 2021-02-11 NOTE — Progress Notes (Signed)
Virtual Visit via Telephone Note Due to national recommendations of social distancing due to COVID 19, telehealth visit is felt to be most appropriate for this patient at this time.  I discussed the limitations, risks, security and privacy concerns of performing an evaluation and management service by telephone and the availability of in person appointments. I also discussed with the patient that there may be a patient responsible charge related to this service. The patient expressed understanding and agreed to proceed.    I connected with Karina Murphy on 02/11/21  at   9:10 AM EDT  EDT by telephone and verified that I am speaking with the correct person using two identifiers.   Consent I discussed the limitations, risks, security and privacy concerns of performing an evaluation and management service by telephone and the availability of in person appointments. I also discussed with the patient that there may be a patient responsible charge related to this service. The patient expressed understanding and agreed to proceed.   Location of Patient: Private Residence   Location of Provider: [facility]    Persons participating in Telemedicine visit: [name] FNP-BC Karina Murphy  Spanish interpreter: ID [phone]   History of Present Illness: Telemedicine visit for: GI ISSUE  States every time she eats food or drinks coffee with milk her stomach gets "swollen and heavy" and she has to self induce vomit in order to feel relief. She also endorses mild pain. She was evaluated in the ED for epigastric pain and nausea and vomiting in April. She was instructed to follow up with GI however she was lost to follow up. She was also referred to GI 07-2020 and they were unable to contact her to schedule.  She has a lap chole for biliary colic She endorses constipation daily. She denies any history of chronic constipation.  CT and [place] 12-12-2020 No acute  findings to explain patient's symptoms.       Past Medical History:  Diagnosis Date  . Anxiety   . Arthritis, rheumatoid (HCC)   . Chronic headaches   . GERD (gastroesophageal reflux disease)   . Hypertension   . Obesity   . Urticaria     Past Surgical History:  Procedure Laterality Date  . NO PAST SURGERIES      Family History  Problem Relation Age of Onset  . Diabetes Mother   . Hypertension Mother   . Stomach cancer Neg Hx   . Colon cancer Neg Hx     Social History   Socioeconomic History  . Marital status: Single    Spouse name: Not on file  . Number of children: 2  . Years of education: Not on file  . Highest education level: Not on file  Occupational History  . Not on file  Tobacco Use  . Smoking status: Never Smoker  . Smokeless tobacco: Never Used  Vaping Use  . Vaping Use: Never used  Substance and Sexual Activity  . Alcohol use: No  . Drug use: No  . Sexual activity: Not Currently    Birth control/protection: Condom  Other Topics Concern  . Not on file  Social History Narrative   Completed 2nd grade.    Social Determinants of Health   Financial Resource Strain: Not on file  Food Insecurity: Not on file  Transportation Needs: Not on file  Physical Activity: Not on file  Stress: Not on file  Social Connections: Not on file     Observations/Objective: Awake, alert  and oriented x 3   Review of Systems  Constitutional: Negative for fever, malaise/fatigue and weight loss.       Increased thirst  HENT: Negative.  Negative for nosebleeds.   Eyes: Negative.  Negative for blurred vision, double vision and photophobia.  Respiratory: Negative.  Negative for cough and shortness of breath.   Cardiovascular: Negative.  Negative for chest pain, palpitations and leg swelling.  Gastrointestinal: Positive for abdominal pain, constipation, heartburn, nausea and vomiting. Negative for blood in stool, diarrhea and melena.  Musculoskeletal: Negative.   Negative for myalgias.  Neurological: Positive for dizziness. Negative for focal weakness, seizures and headaches.  Psychiatric/Behavioral: Negative.  Negative for suicidal ideas.    Assessment and Plan: Diagnoses and all orders for this visit:  Generalized abdominal pain -     ondansetron (ZOFRAN ODT) 4 MG disintegrating tablet; Take 1 tablet (4 mg total) by mouth every 8 (eight) hours as needed for nausea or vomiting. -     Ambulatory referral to Gastroenterology  Abdominal bloating -     ondansetron (ZOFRAN ODT) 4 MG disintegrating tablet; Take 1 tablet (4 mg total) by mouth every 8 (eight) hours as needed for nausea or vomiting. -     Ambulatory referral to Gastroenterology     Follow Up Instructions Return if symptoms worsen or fail to improve.     I discussed the assessment and treatment plan with the patient. The patient was provided an opportunity to ask questions and all were answered. The patient agreed with the plan and demonstrated an understanding of the instructions.   The patient was advised to call back or seek an in-person evaluation if the symptoms worsen or if the condition fails to improve as anticipated.  I provided 18 minutes of non-face-to-face time during this encounter including median intraservice time, reviewing previous notes, labs, imaging, medications and explaining diagnosis and management.  Claiborne Rigg, FNP-BC

## 2021-02-12 LAB — CMP14+EGFR
ALT: 10 IU/L (ref 0–32)
AST: 9 IU/L (ref 0–40)
Albumin/Globulin Ratio: 1.3 (ref 1.2–2.2)
Albumin: 4.1 g/dL (ref 3.8–4.8)
Alkaline Phosphatase: 63 IU/L (ref 44–121)
BUN/Creatinine Ratio: 20 (ref 9–23)
BUN: 16 mg/dL (ref 6–20)
Bilirubin Total: 0.3 mg/dL (ref 0.0–1.2)
CO2: 25 mmol/L (ref 20–29)
Calcium: 9.5 mg/dL (ref 8.7–10.2)
Chloride: 103 mmol/L (ref 96–106)
Creatinine, Ser: 0.82 mg/dL (ref 0.57–1.00)
Globulin, Total: 3.1 g/dL (ref 1.5–4.5)
Glucose: 89 mg/dL (ref 65–99)
Potassium: 4.5 mmol/L (ref 3.5–5.2)
Sodium: 143 mmol/L (ref 134–144)
Total Protein: 7.2 g/dL (ref 6.0–8.5)
eGFR: 94 mL/min/{1.73_m2} (ref >59)

## 2021-02-12 LAB — CBC WITH DIFFERENTIAL/PLATELET
Basophils Absolute: 0 10*3/uL (ref 0.0–0.2)
Basos: 1 %
EOS (ABSOLUTE): 0.1 10*3/uL (ref 0.0–0.4)
Eos: 2 %
Hematocrit: 35.8 % (ref 34.0–46.6)
Hemoglobin: 11.1 g/dL (ref 11.1–15.9)
Immature Grans (Abs): 0 10*3/uL (ref 0.0–0.1)
Immature Granulocytes: 0 %
Lymphocytes Absolute: 3 10*3/uL (ref 0.7–3.1)
Lymphs: 40 %
MCH: 27.8 pg (ref 26.6–33.0)
MCHC: 31 g/dL — ABNORMAL LOW (ref 31.5–35.7)
MCV: 90 fL (ref 79–97)
Monocytes Absolute: 0.6 10*3/uL (ref 0.1–0.9)
Monocytes: 8 %
Neutrophils Absolute: 3.8 10*3/uL (ref 1.4–7.0)
Neutrophils: 49 %
Platelets: 337 10*3/uL (ref 150–450)
RBC: 3.99 x10E6/uL (ref 3.77–5.28)
RDW: 14.9 % (ref 11.7–15.4)
WBC: 7.5 10*3/uL (ref 3.4–10.8)

## 2021-02-12 LAB — LIPID PANEL
Chol/HDL Ratio: 2.7 ratio (ref 0.0–4.4)
Cholesterol, Total: 186 mg/dL (ref 100–199)
HDL: 70 mg/dL (ref >39)
LDL Chol Calc (NIH): 87 mg/dL (ref 0–99)
Triglycerides: 172 mg/dL — ABNORMAL HIGH (ref 0–149)
VLDL Cholesterol Cal: 29 mg/dL (ref 5–40)

## 2021-02-12 LAB — HEMOGLOBIN A1C
Est. average glucose Bld gHb Est-mCnc: 103 mg/dL
Hgb A1c MFr Bld: 5.2 % (ref 4.8–5.6)

## 2021-04-25 ENCOUNTER — Encounter (HOSPITAL_COMMUNITY): Payer: Self-pay

## 2021-04-25 ENCOUNTER — Other Ambulatory Visit: Payer: Self-pay

## 2021-04-25 ENCOUNTER — Ambulatory Visit (HOSPITAL_COMMUNITY)
Admission: EM | Admit: 2021-04-25 | Discharge: 2021-04-25 | Disposition: A | Discharge status: Home / Self Care | Payer: Self-pay | Attending: Physician Assistant | Admitting: Physician Assistant

## 2021-04-25 DIAGNOSIS — M79642 Pain in left hand: Secondary | ICD-10-CM | POA: Insufficient documentation

## 2021-04-25 DIAGNOSIS — M79641 Pain in right hand: Secondary | ICD-10-CM | POA: Insufficient documentation

## 2021-04-25 DIAGNOSIS — Z7952 Long term (current) use of systemic steroids: Secondary | ICD-10-CM | POA: Insufficient documentation

## 2021-04-25 DIAGNOSIS — R509 Fever, unspecified: Secondary | ICD-10-CM | POA: Insufficient documentation

## 2021-04-25 DIAGNOSIS — M791 Myalgia, unspecified site: Secondary | ICD-10-CM | POA: Insufficient documentation

## 2021-04-25 DIAGNOSIS — M069 Rheumatoid arthritis, unspecified: Secondary | ICD-10-CM | POA: Insufficient documentation

## 2021-04-25 DIAGNOSIS — R5381 Other malaise: Secondary | ICD-10-CM | POA: Insufficient documentation

## 2021-04-25 DIAGNOSIS — R5383 Other fatigue: Secondary | ICD-10-CM | POA: Insufficient documentation

## 2021-04-25 DIAGNOSIS — R52 Pain, unspecified: Secondary | ICD-10-CM

## 2021-04-25 DIAGNOSIS — J029 Acute pharyngitis, unspecified: Secondary | ICD-10-CM | POA: Insufficient documentation

## 2021-04-25 DIAGNOSIS — Z20822 Contact with and (suspected) exposure to covid-19: Secondary | ICD-10-CM | POA: Insufficient documentation

## 2021-04-25 DIAGNOSIS — R112 Nausea with vomiting, unspecified: Secondary | ICD-10-CM | POA: Insufficient documentation

## 2021-04-25 LAB — POCT URINALYSIS DIPSTICK, ED / UC
Bilirubin Urine: NEGATIVE
Glucose, UA: NEGATIVE mg/dL
Hgb urine dipstick: NEGATIVE
Ketones, ur: NEGATIVE mg/dL
Leukocytes,Ua: NEGATIVE
Nitrite: NEGATIVE
Protein, ur: NEGATIVE mg/dL
Specific Gravity, Urine: 1.02 (ref 1.005–1.030)
Urobilinogen, UA: 1 mg/dL (ref 0.0–1.0)
pH: 7.5 (ref 5.0–8.0)

## 2021-04-25 LAB — POC URINE PREG, ED: Preg Test, Ur: NEGATIVE

## 2021-04-25 LAB — POCT RAPID STREP A, ED / UC: Streptococcus, Group A Screen (Direct): NEGATIVE

## 2021-04-25 MED ORDER — PREDNISONE 10 MG (21) PO TBPK: ORAL_TABLET | ORAL | 0 refills | Status: DC

## 2021-04-25 NOTE — ED Provider Notes (Signed)
[facility]    CSN: [id number] Arrival date & time: 04/25/21  1213      History   Chief Complaint Chief Complaint  Patient presents with   Hand Pain    HPI Karina Murphy is a 40 y.o. female.   Patient presents today with a 5+ day history of fever.  Reports T-max of 102.5 with last episode of fever yesterday evening.  She reports associated widespread myalgias and arthralgias and is unsure if this is related to diagnosis of rheumatoid arthritis or underlying illness.  Reports pain is rated 10 on a 0-10 pain scale, generalized throughout joints, described as intense aching, no aggravating relieving factors identified.  She has tried ibuprofen and other anti-inflammatory medications without improvement of symptoms.  She reports associated sore throat, episodes of nausea and vomiting, fatigue, malaise.  She denies any cough, congestion, chest pain, shortness of breath, headache, dizziness.  She denies any recent antibiotic use.  Denies any known sick contacts.  Denies history of asthma, COPD, smoking.  She has not had COVID-19 vaccinations.   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      Home Medications    Prior to Admission medications   Medication Sig Start Date End Date Taking? Authorizing Provider  predniSONE (STERAPRED UNI-PAK 21 TAB) 10 MG (21) TBPK tablet As directed 04/25/21  Yes Kimber Fritts K, PA-C  albuterol  (VENTOLIN HFA) 108 (90 Base) MCG/ACT inhaler Inhale 2 puffs into the lungs every 6 (six) hours as needed for wheezing or shortness of breath. 12/23/20   Hoy Register, MD  dicyclomine (BENTYL) 20 MG tablet Take 1 tablet (20 mg total) by mouth 3 (three) times daily as needed for spasms. 12/13/20   Long, Arlyss Repress, MD  sertraline (ZOLOFT) 25 MG tablet Take 1 tablet (25 mg total) by mouth daily. Patient not taking: Reported on 07/31/2020 03/14/20 12/12/20  Claiborne Rigg, NP    Family History Family History  Problem Relation Age of Onset   Diabetes Mother    Hypertension Mother    Stomach cancer Neg Hx    Colon cancer Neg Hx     Social History Social History   Tobacco Use   Smoking status: Never   Smokeless tobacco: Never  Vaping Use   Vaping Use: Never used  Substance Use Topics   Alcohol use: No   Drug use: No     Allergies   Pineapple   Review of Systems Review of Systems  Constitutional:  Positive for activity change, fatigue and fever. Negative for appetite change.  HENT:  Positive for sore throat. Negative for congestion, sinus pressure and sneezing.   Respiratory:  Negative for cough and shortness of breath.   Cardiovascular:  Negative for chest pain.  Gastrointestinal:  Positive for nausea and vomiting. Negative for abdominal pain and diarrhea.  Musculoskeletal:  Positive for arthralgias and myalgias.  Neurological:  Positive for headaches. Negative for dizziness and light-headedness.    Physical Exam Triage Vital Signs ED Triage Vitals  Enc Vitals Group     BP 04/25/21 1415 125/85     Pulse Rate 04/25/21 1415 63     Resp 04/25/21 1415 18     Temp 04/25/21 1415 98.8 F (37.1 C)     Temp Source 04/25/21 1415 Oral     SpO2 04/25/21 1415 99 %     Weight --      Height --      Head Circumference --      Peak Flow --      Pain Score 04/25/21 1413 10     Pain Loc --      Pain Edu? --      Excl. in GC? --    No data found.  Updated Vital Signs BP 125/85  (BP Location: Left Arm)   Pulse 63   Temp 98.8 F (37.1 C) (Oral)   Resp 18   SpO2 99%   Visual Acuity Right Eye Distance:   Left Eye Distance:   Bilateral Distance:    Right Eye Near:   Left Eye Near:    Bilateral Near:     Physical Exam Vitals reviewed.  Constitutional:      General: She is awake. She is not in acute distress.    Appearance: Normal appearance. She is normal weight. She is not ill-appearing.     Comments: Very pleasant female appears stated age no acute distress sitting comfortably in exam room  HENT:     Head: Normocephalic and atraumatic.     Right Ear: Tympanic membrane, ear canal and external ear normal. Tympanic membrane is not erythematous or bulging.     Left Ear: Tympanic membrane, ear canal and external ear normal. Tympanic membrane is not erythematous or bulging.     Nose:     Right Sinus: No maxillary sinus tenderness or frontal sinus tenderness.     Left Sinus: No maxillary sinus tenderness or frontal sinus tenderness.     Mouth/Throat:     Pharynx: Uvula midline. No oropharyngeal exudate or posterior oropharyngeal erythema.  Cardiovascular:     Rate and Rhythm: Normal rate and regular rhythm.     Heart sounds: Normal heart sounds, S1 normal and S2 normal. No murmur heard. Pulmonary:     Effort: Pulmonary effort is normal.     Breath sounds: Normal breath sounds. No wheezing, rhonchi or rales.     Comments: Clear to auscultation bilaterally Abdominal:     General: Bowel sounds are normal.     Palpations: Abdomen is soft.     Tenderness: There is no abdominal tenderness.  Musculoskeletal:     Comments: Hands: Swelling noted at interphalangeal joints with decreased flexion of phalanges secondary to pain.  Hands neurovascularly intact.  Lymphadenopathy:     Head:     Right side of head: No submental, submandibular or tonsillar adenopathy.     Left side of head: No submental, submandibular or tonsillar adenopathy.     Cervical: No cervical  adenopathy.  Psychiatric:        Behavior: Behavior is cooperative.     UC Treatments / Results  Labs (all labs ordered are listed, but only abnormal results are displayed) Labs Reviewed  SARS CORONAVIRUS 2 (TAT 6-24 HRS)  CULTURE, GROUP A STREP [facility])  POCT URINALYSIS DIPSTICK, ED / UC  POC URINE PREG,  ED  POCT RAPID STREP A, ED / UC    EKG   Radiology No results found.  Procedures Procedures (including critical care time)  Medications Ordered in UC Medications - No data to display  Initial Impression / Assessment and Plan / UC Course  I have reviewed the triage vital signs and the nursing notes.  Pertinent labs & imaging results that were available during my care of the patient were reviewed by me and considered in my medical decision making (see chart for details).      Suspect that underlying viral infection caused her rheumatoid arthritis flare leading to widespread pain.  Given sore throat and fever strep testing was obtained and negative in clinic; cultures pending.  UA was negative.  Urine pregnancy was negative.  COVID-19 testing is pending.  Discussed potential utility of blood work including CBC and CMP given widespread body aches and fever but patient declined this today.  We will start prednisone taper to help manage rheumatoid arthritis flare and patient was directed not to take NSAIDs with this medication due to risk of GI bleeding.  She can take Tylenol for breakthrough pain.  Discussed alarm symptoms that warrant emergent evaluation.  Discussed that if her COVID-19 test is negative and she continues to have fever or symptoms or not significantly improving she needs to return for further evaluation including blood work as we discussed.  Recommend she follow-up with her primary care provider within 1 week.  Strict return precautions given to which patient expressed understanding.  Final Clinical Impressions(s) / UC Diagnoses   Final diagnoses:  Fever,  unspecified  Sore throat  Body aches  Bilateral hand pain  Rheumatoid arthritis flare (HCC)     Discharge Instructions      Start prednisone taper as prescribed.  Do not take NSAIDs including aspirin, ibuprofen/Advil, naproxen/Aleve with this medication as it can cause stomach bleeding.  You can use Tylenol for breakthrough pain.  We will contact you if your COVID test is positive or if we need to start antibiotics based on your throat culture.  If your symptoms or not improving within 24 to 48 hours please return for blood work and additional work-up as we discussed.  Follow-up with your primary care provider within 1 week.     ED Prescriptions     Medication Sig Dispense Auth. Provider   predniSONE (STERAPRED UNI-PAK 21 TAB) 10 MG (21) TBPK tablet As directed 21 tablet Rakeem Colley K, PA-C      PDMP not reviewed this encounter.   Jeani Hawking, PA-C 04/25/21 1556

## 2021-04-25 NOTE — Discharge Instructions (Addendum)
Start prednisone taper as prescribed.  Do not take NSAIDs including aspirin, ibuprofen/Advil, naproxen/Aleve with this medication as it can cause stomach bleeding.  You can use Tylenol for breakthrough pain.  We will contact you if your COVID test is positive or if we need to start antibiotics based on your throat culture.  If your symptoms or not improving within 24 to 48 hours please return for blood work and additional work-up as we discussed.  Follow-up with your primary care provider within 1 week.

## 2021-04-25 NOTE — ED Triage Notes (Signed)
Pt reports bilateral hand pain x 1 week; fever 102.5 F x 2 days. Naproxen gives no relief to hand pain.

## 2021-04-26 LAB — SARS CORONAVIRUS 2 (TAT 6-24 HRS): SARS Coronavirus 2: NEGATIVE

## 2021-04-28 LAB — CULTURE, GROUP A STREP (THRC)

## 2021-04-30 ENCOUNTER — Ambulatory Visit: Payer: Self-pay | Admitting: *Deleted

## 2021-04-30 NOTE — Telephone Encounter (Signed)
Patient is calling for appointment- she was sen at [facility] with negative COVID/strep testing- but her fever, cough and chest tightness are worse. Patient advised mobile unit for evaluation- no appointment available in office.

## 2021-04-30 NOTE — Telephone Encounter (Signed)
Reason for Disposition  [1] Fever returns after gone for over 24 hours AND [2] symptoms worse or not improved  Answer Assessment - Initial Assessment Questions 1. COVID-19 DIAGNOSIS: "Who made your COVID-19 diagnosis?" "Was it confirmed by a positive lab test or self-test?" If not diagnosed by a doctor (or NP/PA), ask "Are there lots of cases (community spread) where you live?" Note: See public health department website, if unsure.     Patient was seen at [facility]- Saturday and tested 2. COVID-19 EXPOSURE: "Was there any known exposure to COVID before the symptoms began?" CDC Definition of close contact: within 6 feet (2 meters) for a total of 15 minutes or more over a 24-hour period.      No COVID exposure 3. ONSET: "When did the COVID-19 symptoms start?"      On/off- Tuesday last week-over 1 week 4. WORST SYMPTOM: "What is your worst symptom?" (e.g., cough, fever, shortness of breath, muscle aches)     Fever, fatigue 5. COUGH: "Do you have a cough?" If Yes, ask: "How bad is the cough?"       Yes- slight 6. FEVER: "Do you have a fever?" If Yes, ask: "What is your temperature, how was it measured, and when did it start?"     Yes- no thermometer at work 7. RESPIRATORY STATUS: "Describe your breathing?" (e.g., shortness of breath, wheezing, unable to speak)      Patient is having chest tightness 8. BETTER-SAME-WORSE: "Are you getting better, staying the same or getting worse compared to yesterday?"  If getting worse, ask, "In what way?"     Getting worse- chest tightness 9. HIGH RISK DISEASE: "Do you have any chronic medical problems?" (e.g., asthma, heart or lung disease, weak immune system, obesity, etc.)     no 10. VACCINE: "Have you had the COVID-19 vaccine?" If Yes, ask: "Which one, how many shots, when did you get it?"       no 11. BOOSTER: "Have you received your COVID-19 booster?" If Yes, ask: "Which one and when did you get it?"       no 12. PREGNANCY: "Is there any chance you are  pregnant?" "When was your last menstrual period?"       Not asked 13. OTHER SYMPTOMS: "Do you have any other symptoms?"  (e.g., chills, fatigue, headache, loss of smell or taste, muscle pain, sore throat)       Headache,fatigue 14. O2 SATURATION MONITOR:  "Do you use an oxygen saturation monitor (pulse oximeter) at home?" If Yes, ask "What is your reading (oxygen level) today?" "What is your usual oxygen saturation reading?" (e.g., 95%)       N/a  Protocols used: Coronavirus (COVID-19) Diagnosed or Suspected-A-AH

## 2021-05-01 NOTE — Telephone Encounter (Signed)
Transferred call to Arna Medici to assist with scheduling an appt. For patient.

## 2021-05-04 ENCOUNTER — Encounter: Payer: Self-pay | Admitting: Nurse Practitioner

## 2021-05-04 ENCOUNTER — Other Ambulatory Visit: Payer: Self-pay

## 2021-05-04 ENCOUNTER — Telehealth (INDEPENDENT_AMBULATORY_CARE_PROVIDER_SITE_OTHER): Payer: Self-pay | Admitting: Nurse Practitioner

## 2021-05-04 DIAGNOSIS — J069 Acute upper respiratory infection, unspecified: Secondary | ICD-10-CM

## 2021-05-04 MED ORDER — BENZONATATE 100 MG PO CAPS
100.0000 mg | ORAL_CAPSULE | Freq: Two times a day (BID) | ORAL | 0 refills | Status: DC | PRN
  Filled 2021-05-04: qty 20, 10d supply, fill #0

## 2021-05-04 MED ORDER — AZITHROMYCIN 250 MG PO TABS
ORAL_TABLET | ORAL | 0 refills | Status: AC
  Filled 2021-05-04: qty 6, 5d supply, fill #0

## 2021-05-04 NOTE — Progress Notes (Signed)
Virtual Visit via Telephone Note  I connected with Karina Murphy on 05/04/21 at  9:30 AM EDT by telephone and verified that I am speaking with the correct person using two identifiers.  Location: Patient: home Provider: office   I discussed the limitations, risks, security and privacy concerns of performing an evaluation and management service by telephone and the availability of in person appointments. I also discussed with the patient that there may be a patient responsible charge related to this service. The patient expressed understanding and agreed to proceed.   History of Present Illness:  Patient presents today for sick visit through telephone visit.  Patient complains today of cough and chest congestion and chest pressure.  Patient was seen in urgent care on 04/25/2021 and tested negative for COVID and strep.  Patient was prescribed prednisone with minimal relief noted.  Discussed with patient that is concerning for developing pneumonia.  Patient is unable to get an x-ray due to financial concerns.  We will order azithromycin and Tessalon Perles.  Patient can follow-up in the office or go to the ED if symptoms worsen. Denies f/c/s, n/v/d, hemoptysis, PND, chest pain or edema.    Observations/Objective:  Vitals with BMI 04/25/2021 01/12/2021 12/13/2020  Height - - -  Weight - - -  BMI - - -  Systolic 125 100 465  Diastolic 85 66 76  Pulse 63 95 50      Assessment and Plan:  Chest congestion Viral URI:   Stay well hydrated  Stay active  Deep breathing exercises  May take tylenol or fever or pain  May take mucinex DM twice daily  Will order azithromycin  Wll order tessalon perles    Follow up:  Follow up in 2 weeks or sooner if needed    I discussed the assessment and treatment plan with the patient. The patient was provided an opportunity to ask questions and all were answered. The patient agreed with the plan and demonstrated an understanding of the  instructions.   The patient was advised to call back or seek an in-person evaluation if the symptoms worsen or if the condition fails to improve as anticipated.  I provided 23 minutes of non-face-to-face time during this encounter.   Ivonne Andrew, NP

## 2021-05-04 NOTE — Patient Instructions (Addendum)
Chest congestion Viral URI:   Stay well hydrated  Stay active  Deep breathing exercises  May take tylenol or fever or pain  May take mucinex DM twice daily  Will order azithromycin  Wll order tessalon perles    Follow up:  Follow up in 2 weeks or sooner if needed

## 2021-05-06 ENCOUNTER — Other Ambulatory Visit: Payer: Self-pay

## 2021-05-07 ENCOUNTER — Ambulatory Visit: Payer: Self-pay | Attending: Nurse Practitioner

## 2021-05-07 ENCOUNTER — Other Ambulatory Visit: Payer: Self-pay

## 2021-05-07 ENCOUNTER — Telehealth: Payer: Self-pay | Admitting: Nurse Practitioner

## 2021-05-07 NOTE — Telephone Encounter (Signed)
error 

## 2021-06-16 ENCOUNTER — Ambulatory Visit: Payer: Self-pay | Admitting: Nurse Practitioner

## 2021-06-24 ENCOUNTER — Other Ambulatory Visit: Payer: Self-pay

## 2021-06-24 ENCOUNTER — Ambulatory Visit: Payer: Self-pay | Attending: Nurse Practitioner | Admitting: Nurse Practitioner

## 2021-06-24 ENCOUNTER — Encounter: Payer: Self-pay | Admitting: Nurse Practitioner

## 2021-06-24 VITALS — BP 112/75 | HR 82 | Ht 64.0 in | Wt 211.5 lb

## 2021-06-24 DIAGNOSIS — R112 Nausea with vomiting, unspecified: Secondary | ICD-10-CM

## 2021-06-24 DIAGNOSIS — L509 Urticaria, unspecified: Secondary | ICD-10-CM

## 2021-06-24 DIAGNOSIS — T7840XA Allergy, unspecified, initial encounter: Secondary | ICD-10-CM

## 2021-06-24 DIAGNOSIS — R1013 Epigastric pain: Secondary | ICD-10-CM

## 2021-06-24 DIAGNOSIS — R140 Abdominal distension (gaseous): Secondary | ICD-10-CM

## 2021-06-24 MED ORDER — ONDANSETRON 4 MG PO TBDP
4.0000 mg | ORAL_TABLET | Freq: Three times a day (TID) | ORAL | 1 refills | Status: DC | PRN
  Filled 2021-06-24: qty 40, 7d supply, fill #0

## 2021-06-24 MED ORDER — HYDROXYZINE HCL 10 MG PO TABS
10.0000 mg | ORAL_TABLET | Freq: Three times a day (TID) | ORAL | 1 refills | Status: AC | PRN
  Filled 2021-06-24: qty 42, 14d supply, fill #0

## 2021-06-24 MED ORDER — PANTOPRAZOLE SODIUM 40 MG PO TBEC
40.0000 mg | DELAYED_RELEASE_TABLET | Freq: Every day | ORAL | 1 refills | Status: DC
Start: 2021-06-24 — End: 2021-11-03
  Filled 2021-06-24: qty 30, 30d supply, fill #0

## 2021-06-24 MED ORDER — PREDNISONE 20 MG PO TABS
20.0000 mg | ORAL_TABLET | Freq: Every day | ORAL | 0 refills | Status: DC
  Filled 2021-06-24: qty 5, 5d supply, fill #0

## 2021-06-24 NOTE — Progress Notes (Signed)
Assessment & Plan:  Karina Murphy was seen today for abdominal pain.  Diagnoses and all orders for this visit:  Abdominal pain, epigastric -     Ambulatory referral to Gastroenterology -     H. pylori breath test -     CMP14+EGFR  Abdominal bloating -     Ambulatory referral to Gastroenterology -     H. pylori breath test -     CMP14+EGFR  Nausea and vomiting, unspecified vomiting type -     ondansetron (ZOFRAN ODT) 4 MG disintegrating tablet; Take 1-2 tablets (4-8 mg total) by mouth every 8 (eight) hours as needed for nausea or vomiting. -     pantoprazole (PROTONIX) 40 MG tablet; Take 1 tablet (40 mg total) by mouth daily.  Allergic rash present on examination -     hydrOXYzine (ATARAX/VISTARIL) 10 MG tablet; Take 1 tablet (10 mg total) by mouth 3 (three) times daily as needed for up to 14 days. FOR ITCHING -     predniSONE (DELTASONE) 20 MG tablet; Take 1 tablet (20 mg total) by mouth daily with breakfast.   Patient has been counseled on age-appropriate routine health concerns for screening and prevention. These are reviewed and up-to-date. Referrals have been placed accordingly. Immunizations are up-to-date or declined.    Subjective:   Chief Complaint  Patient presents with   Abdominal Pain   HPI Karina Murphy 40 y.o. female presents to office today for abdominal pain and bloating. She was referred to GI several months ago however reports she never received any calls to schedule an appointment.  She has a past medical history of Anxiety, Arthritis, rheumatoid, Chronic headaches, GERD, Hypertension, Obesity, and Urticaria.    Abdominal Pain States every time she eats food or drinks coffee with milk her stomach gets "swollen and heavy" and she has to self induce vomit in order to feel relief. She also endorses mild pain. She was evaluated in the ED for epigastric pain and nausea and vomiting in April. S/P lap chole for biliary colic [id number]. Associated symptoms:  constipation. She denies any history of chronic constipation.  CT and [place] 12-12-2020 No acute findings to explain patient's symptomsVomiting, bloating and stomach pain. Adbominal distension   Rash: Patient complains of generalized rash. Rash started 5 days ago. Rash appearing as hives.  Rash has not changed over time Discomfort associated with rash: is pruritic.  Associated symptoms: none. Denies: fever. Patient has not had previous evaluation of rash. Patient has not had previous treatment. Patient has not had contacts with similar rash. Patient has not identified precipitant. Patient has not had new exposures (soaps, lotions, laundry detergents, foods, medications, plants, insects or animals.)   Review of Systems  Constitutional:  Negative for fever, malaise/fatigue and weight loss.  HENT: Negative.  Negative for nosebleeds.   Eyes: Negative.  Negative for blurred vision, double vision and photophobia.  Respiratory: Negative.  Negative for cough and shortness of breath.   Cardiovascular: Negative.  Negative for chest pain, palpitations and leg swelling.  Gastrointestinal:  Positive for abdominal pain and constipation. Negative for blood in stool, diarrhea, heartburn, melena, nausea and vomiting.  Musculoskeletal: Negative.  Negative for myalgias.  Skin:  Positive for itching and rash.  Neurological: Negative.  Negative for dizziness, focal weakness, seizures and headaches.  Psychiatric/Behavioral: Negative.  Negative for suicidal ideas.    Past Medical History:  Diagnosis Date   Anxiety    Arthritis, rheumatoid (HCC)    Chronic headaches    GERD (gastroesophageal  reflux disease)    Hypertension    Obesity    Urticaria     Past Surgical History:  Procedure Laterality Date   NO PAST SURGERIES      Family History  Problem Relation Age of Onset   Diabetes Mother    Hypertension Mother    Stomach cancer Neg Hx    Colon cancer Neg Hx     Social History Reviewed with no changes to  be made today.   Outpatient Medications Prior to Visit  Medication Sig Dispense Refill   albuterol (VENTOLIN HFA) 108 (90 Base) MCG/ACT inhaler Inhale 2 puffs into the lungs every 6 (six) hours as needed for wheezing or shortness of breath. (Patient not taking: Reported on 06/24/2021) 18 g 2   benzonatate (TESSALON) 100 MG capsule Take 1 capsule (100 mg total) by mouth 2 (two) times daily as needed for cough. (Patient not taking: Reported on 06/24/2021) 20 capsule 0   dicyclomine (BENTYL) 20 MG tablet Take 1 tablet (20 mg total) by mouth 3 (three) times daily as needed for spasms. (Patient not taking: Reported on 06/24/2021) 20 tablet 0   predniSONE (STERAPRED UNI-PAK 21 TAB) 10 MG (21) TBPK tablet As directed (Patient not taking: Reported on 06/24/2021) 21 tablet 0   No facility-administered medications prior to visit.    Allergies  Allergen Reactions   Pineapple Itching       Objective:    BP 112/75   Pulse 82   Ht '5\' 4"'  (1.626 m)   Wt 211 lb 8 oz (95.9 kg)   LMP 05/31/2021   SpO2 100%   BMI 36.30 kg/m  Wt Readings from Last 3 Encounters:  06/24/21 211 lb 8 oz (95.9 kg)  10/28/20 197 lb (89.4 kg)  08/25/20 198 lb (89.8 kg)    Physical Exam Vitals and nursing note reviewed.  Constitutional:      Appearance: She is well-developed.  HENT:     Head: Normocephalic and atraumatic.  Cardiovascular:     Rate and Rhythm: Normal rate and regular rhythm.     Heart sounds: Normal heart sounds. No murmur heard.   No friction rub. No gallop.  Pulmonary:     Effort: Pulmonary effort is normal. No tachypnea or respiratory distress.     Breath sounds: Normal breath sounds. No decreased breath sounds, wheezing, rhonchi or rales.  Chest:     Chest wall: No tenderness.  Abdominal:     General: Bowel sounds are normal.     Palpations: Abdomen is soft.  Musculoskeletal:        General: Normal range of motion.     Cervical back: Normal range of motion.  Skin:    General: Skin is  warm and dry.     Findings: Rash present. Rash is urticarial.  Neurological:     Mental Status: She is alert and oriented to person, place, and time.     Coordination: Coordination normal.  Psychiatric:        Behavior: Behavior normal. Behavior is cooperative.        Thought Content: Thought content normal.        Judgment: Judgment normal.         Patient has been counseled extensively about nutrition and exercise as well as the importance of adherence with medications and regular follow-up. The patient was given clear instructions to go to ER or return to medical center if symptoms don't improve, worsen or new problems develop. The patient verbalized understanding.  Follow-up: Return if symptoms worsen or fail to improve.   Gildardo Pounds, FNP-BC [facility] and [facility] Leonore, [place]   06/24/2021, 10:26 PM

## 2021-06-25 LAB — CMP14+EGFR
ALT: 10 IU/L (ref 0–32)
AST: 18 IU/L (ref 0–40)
Albumin/Globulin Ratio: 1.3 (ref 1.2–2.2)
Albumin: 4.2 g/dL (ref 3.8–4.8)
Alkaline Phosphatase: 65 IU/L (ref 44–121)
BUN/Creatinine Ratio: 12 (ref 9–23)
BUN: 9 mg/dL (ref 6–20)
Bilirubin Total: 0.7 mg/dL (ref 0.0–1.2)
CO2: 24 mmol/L (ref 20–29)
Calcium: 9.6 mg/dL (ref 8.7–10.2)
Chloride: 104 mmol/L (ref 96–106)
Creatinine, Ser: 0.78 mg/dL (ref 0.57–1.00)
Globulin, Total: 3.3 g/dL (ref 1.5–4.5)
Glucose: 102 mg/dL — ABNORMAL HIGH (ref 70–99)
Potassium: 4 mmol/L (ref 3.5–5.2)
Sodium: 142 mmol/L (ref 134–144)
Total Protein: 7.5 g/dL (ref 6.0–8.5)
eGFR: 100 mL/min/{1.73_m2} (ref >59)

## 2021-06-25 LAB — H. PYLORI BREATH TEST: H pylori Breath Test: NEGATIVE

## 2021-06-26 ENCOUNTER — Telehealth: Payer: Self-pay

## 2021-06-26 NOTE — Telephone Encounter (Signed)
-----   Message from Claiborne Rigg, NP sent at 06/25/2021  5:42 PM EDT ----- Kidney, liver function and electrolytes are normal.  H pylori negative for stomach bacteria.

## 2021-08-20 ENCOUNTER — Ambulatory Visit: Payer: Self-pay

## 2021-08-20 NOTE — Telephone Encounter (Signed)
Pt called in states that she has dizziness that been going on for a week now. She states that she feels like the room is spinning and feels like falling. Pt says that she has to sit down and rest when this occurs. She also reports that this dizziness occurred over a year ago but it went away. Denies taking any new medications. Scheduled pt for appt at 08/24/21 at 1530. Care advice given and pt verbalized understanding. No other questions/concerns noted.    Reason for Disposition  [1] MILD dizziness (e.g., walking normally) AND [2] has NOT been evaluated by physician for this  (Exception: dizziness caused by heat exposure, sudden standing, or poor fluid intake)  Answer Assessment - Initial Assessment Questions 1. DESCRIPTION: "Describe your dizziness."     Spinning around feeling and feels like going to fall 2. LIGHTHEADED: "Do you feel lightheaded?" (e.g., somewhat faint, woozy, weak upon standing)     yes 3. VERTIGO: "Do you feel like either you or the room is spinning or tilting?" (i.e. vertigo)     yes 4. SEVERITY: "How bad is it?"  "Do you feel like you are going to faint?" "Can you stand and walk?"   - MILD: Feels slightly dizzy, but walking normally.   - MODERATE: Feels unsteady when walking, but not falling; interferes with normal activities (e.g., school, work).   - SEVERE: Unable to walk without falling, or requires assistance to walk without falling; feels like passing out now.      mild 5. ONSET:  "When did the dizziness begin?"     1 week ago 6. AGGRAVATING FACTORS: "Does anything make it worse?" (e.g., standing, change in head position)     movements 8. CAUSE: "What do you think is causing the dizziness?"     Unsure 9. RECURRENT SYMPTOM: "Have you had dizziness before?" If Yes, ask: "When was the last time?" "What happened that time?"     A year ago but went away 10. OTHER SYMPTOMS: "Do you have any other symptoms?" (e.g., fever, chest pain, vomiting, diarrhea, bleeding)        No 11. PREGNANCY: "Is there any chance you are pregnant?" "When was your last menstrual period?"       No  Protocols used: Dizziness - Lightheadedness-A-AH

## 2021-08-24 ENCOUNTER — Other Ambulatory Visit: Payer: Self-pay

## 2021-08-24 ENCOUNTER — Ambulatory Visit: Payer: Self-pay | Attending: Family Medicine | Admitting: Family Medicine

## 2021-08-24 ENCOUNTER — Encounter: Payer: Self-pay | Admitting: Family Medicine

## 2021-08-24 VITALS — BP 132/80 | HR 86 | Ht 64.0 in | Wt 201.0 lb

## 2021-08-24 DIAGNOSIS — L509 Urticaria, unspecified: Secondary | ICD-10-CM

## 2021-08-24 DIAGNOSIS — R42 Dizziness and giddiness: Secondary | ICD-10-CM

## 2021-08-24 MED ORDER — MECLIZINE HCL 25 MG PO TABS
25.0000 mg | ORAL_TABLET | Freq: Three times a day (TID) | ORAL | 1 refills | Status: DC | PRN
  Filled 2021-08-24: qty 60, 20d supply, fill #0

## 2021-08-24 MED ORDER — TRIAMCINOLONE ACETONIDE 0.1 % EX CREA
1.0000 "application " | TOPICAL_CREAM | Freq: Two times a day (BID) | CUTANEOUS | 0 refills | Status: DC
  Filled 2021-08-24: qty 60, 30d supply, fill #0

## 2021-08-24 NOTE — Progress Notes (Signed)
Fatigue and dizziness.  Has rash on arm for more than 1 month.

## 2021-08-24 NOTE — Progress Notes (Signed)
Subjective:  Patient ID: Karina Murphy, female    DOB: 1980/11/25  Age: 40 y.o. MRN: [id number]  CC: Dizziness   HPI Nikeshia Dressman is a 40 y.o. year old female patient of Geryl Rankins, NP here for an acute visit.  Interval History: She complains of dizziness which occur out of the blues and it can occur while she is standing as she stands to make sandwiches at work. Symptoms have been present x3 weeks with associated spinning of the room.  It is sometimes associated with changing position but other times not.  She denies presence of nausea but she states she has been vomiting daily.LMP was 08/20/21. She has tinnitus and also has otalgia She had been referred to GI by her PCP due to chronic vomiting.  She complains of pruritic rash on her left forearm for which she received Hydroxyzine but she complains it makes her sleepy Past Medical History:  Diagnosis Date   Anxiety    Arthritis, rheumatoid (HCC)    Chronic headaches    GERD (gastroesophageal reflux disease)    Hypertension    Obesity    Urticaria     Past Surgical History:  Procedure Laterality Date   NO PAST SURGERIES      Family History  Problem Relation Age of Onset   Diabetes Mother    Hypertension Mother    Stomach cancer Neg Hx    Colon cancer Neg Hx     Allergies  Allergen Reactions   Pineapple Itching    Outpatient Medications Prior to Visit  Medication Sig Dispense Refill   ondansetron (ZOFRAN ODT) 4 MG disintegrating tablet Take 1-2 tablets (4-8 mg total) by mouth every 8 (eight) hours as needed for nausea or vomiting. 40 tablet 1   pantoprazole (PROTONIX) 40 MG tablet Take 1 tablet (40 mg total) by mouth daily. 90 tablet 1   predniSONE (DELTASONE) 20 MG tablet Take 1 tablet (20 mg total) by mouth daily with breakfast. (Patient not taking: Reported on 08/24/2021) 5 tablet 0   No facility-administered medications prior to visit.     ROS Review of Systems  Constitutional:  Negative  for activity change, appetite change and fatigue.  HENT:  Negative for congestion, sinus pressure and sore throat.   Eyes:  Negative for visual disturbance.  Respiratory:  Negative for cough, chest tightness, shortness of breath and wheezing.   Cardiovascular:  Negative for chest pain and palpitations.  Gastrointestinal:  Positive for vomiting. Negative for abdominal distention, abdominal pain and constipation.  Endocrine: Negative for polydipsia.  Genitourinary:  Negative for dysuria and frequency.  Musculoskeletal:  Negative for arthralgias and back pain.  Skin:  Positive for rash.  Neurological:  Positive for dizziness. Negative for tremors, light-headedness and numbness.  Hematological:  Does not bruise/bleed easily.  Psychiatric/Behavioral:  Negative for agitation and behavioral problems.    Objective:  BP 132/80   Pulse 86   Ht 5\' 4"  (1.626 m)   Wt 201 lb (91.2 kg)   SpO2 100%   BMI 34.50 kg/m   BP/Weight 08/24/2021 06/24/2021 [id number]  Systolic BP [id number]  Diastolic BP 80 75 85  Wt. (Lbs) 201 211.5 -  BMI 34.5 36.3 -      Physical Exam Constitutional:      Appearance: She is well-developed.  HENT:     Head:     Comments: Positive Dix Christella Noa maneouvre Cardiovascular:     Rate and Rhythm: Normal rate.     Heart sounds:  Normal heart sounds. No murmur heard. Pulmonary:     Effort: Pulmonary effort is normal.     Breath sounds: Normal breath sounds. No wheezing or rales.  Chest:     Chest wall: No tenderness.  Abdominal:     General: Bowel sounds are normal. There is no distension.     Palpations: Abdomen is soft. There is no mass.     Tenderness: There is no abdominal tenderness.  Musculoskeletal:        General: Normal range of motion.     Right lower leg: No edema.     Left lower leg: No edema.  Skin:    Comments: Rash on the lateral aspect of the forearm  Neurological:     Mental Status: She is alert and oriented to person, place, and time.   Psychiatric:        Mood and Affect: Mood normal.    CMP Latest Ref Rng & Units 06/24/2021 02/11/2021 12/12/2020  Glucose 70 - 99 mg/dL 824(M) 89 87  BUN 6 - 20 mg/dL 9 16 12   Creatinine 0.57 - 1.00 mg/dL 3.53 6.14  Sodium 134 - 144 mmol/L 142 143 137  Potassium 3.5 - 5.2 mmol/L 4.0 4.5 3.4(L)  Chloride 96 - 106 mmol/L 104 103 105  CO2 20 - 29 mmol/L 24 25 25   Calcium 8.7 - 10.2 mg/dL 9.6 9.5 9.1  Total Protein 6.0 - 8.5 g/dL 7.5 7.2 7.5  Total Bilirubin 0.0 - 1.2 mg/dL 0.7 0.3 4.31)  Alkaline Phos 44 - 121 IU/L 65 63 61  AST 0 - 40 IU/L 18 9 17   ALT 0 - 32 IU/L 10 10 15     Lipid Panel     Component Value Date/Time   CHOL 186 02/11/2021 1609   TRIG 172 (H) 02/11/2021 1609   HDL 70 02/11/2021 1609   CHOLHDL 2.7 02/11/2021 1609   LDLCALC 87 02/11/2021 1609    CBC    Component Value Date/Time   WBC 7.5 02/11/2021 1609   WBC 6.3 12/12/2020 1842   RBC 3.99 02/11/2021 1609   RBC 4.17 12/12/2020 1842   HGB 11.1 02/11/2021 1609   HCT 35.8 02/11/2021 1609   PLT 337 02/11/2021 1609   MCV 90 02/11/2021 1609   MCH 27.8 02/11/2021 1609   MCH 28.1 12/12/2020 1842   MCHC 31.0 (L) 02/11/2021 1609   MCHC 31.5 12/12/2020 1842   RDW 14.9 02/11/2021 1609   LYMPHSABS 3.0 02/11/2021 1609   MONOABS 0.5 12/12/2020 1842   EOSABS 0.1 02/11/2021 1609   BASOSABS 0.0 02/11/2021 1609    Lab Results  Component Value Date   HGBA1C 5.2 02/11/2021    Assessment & Plan:  1. Vertigo Advised to change positions slowly Discussed sedating side effects of meclizine - meclizine (ANTIVERT) 25 MG tablet; Take 1 tablet (25 mg total) by mouth 3 (three) times daily as needed for dizziness.  Dispense: 60 tablet; Refill: 1  2. Hives Due to sedation with hydroxyzine I have placed on triamcinolone cream - triamcinolone cream (KENALOG) 0.1 %; Apply 1 application topically 2 (two) times daily.  Dispense: 80 g; Refill: 0   Meds ordered this encounter  Medications   meclizine (ANTIVERT) 25 MG  tablet    Sig: Take 1 tablet (25 mg total) by mouth 3 (three) times daily as needed for dizziness.    Dispense:  60 tablet    Refill:  1   triamcinolone cream (KENALOG) 0.1 %    Sig: Apply  1 application topically 2 (two) times daily.    Dispense:  80 g    Refill:  0    Follow-up: Return if symptoms worsen or fail to improve, for Medical conditions with PCP.       Charlott Rakes, MD, FAAFP. [facility] and [place], [place]   08/24/2021, 4:23 PM

## 2021-08-24 NOTE — Patient Instructions (Signed)
Vrtigo Vertigo El vrtigo es la sensacin de que usted o todo lo que lo rodea se mueve cuando en realidad eso no sucede. Esta sensacin puede aparecer y desaparecer en cualquier momento. El vrtigo suele desaparecer solo. El vrtigo puede ser peligroso si ocurre mientras est haciendo algo que podra suponer un riesgo para usted y para los dems, por [place], conduciendo un automvil u Optometrist. Su mdico le har estudios para tratar de Production assistant, radio causa del vrtigo. Los estudios tambin ayudarn al mdico a decidir cul es la mejor manera de tratar su afeccin. Siga estas instrucciones en su casa: Comida y bebida   La deshidratacin puede empeorar el vrtigo. Beba suficiente lquido como para Pharmacologist la orina de color amarillo plido. No beba alcohol. Actividad Retome sus actividades normales segn lo indicado por el mdico. Pregntele al mdico qu actividades son seguras para usted. Por la maana, sintese primero a un lado de la cama. Cuando se sienta bien, pngase lentamente de [address] se sostiene de algo, hasta que sepa que ha logrado el equilibrio. Muvase lentamente. Evite algunas posiciones o determinados movimientos repentinos de la cabeza y el cuerpo como se lo haya indicado el mdico. Si tiene dificultad para caminar o Pharmacologist el equilibrio, use un bastn para Photographer estabilidad. Si se siente mareado o inestable, sintese de inmediato. No haga ninguna tarea que podra ponerlo en riesgo a usted o a Chemical engineer de tener vrtigo. Evite agacharse si se siente mareado. En su casa, coloque los objetos de modo que le resulte fcil alcanzarlos sin doblarse o agacharse. No conduzca vehculos ni opere maquinaria si se siente mareado. Instrucciones generales Use los medicamentos de venta libre y los recetados solamente como se lo haya indicado el mdico. Concurra a todas las visitas de seguimiento. Esto es importante. Comunquese con un mdico si: Los  medicamentos no le [address] el vrtigo o este [place]. Su afeccin empeora o presenta sntomas nuevos. Tiene fiebre. Siente nuseas o tiene vmitos, o si las nuseas empeoran. Sus familiares o amigos advierten cambios en su comportamiento. Tiene adormecimiento o sensacin de pinchazos y hormigueo en una parte del cuerpo. Busque ayuda de inmediato si: Se siente mareado continuamente o se desmaya. Presenta dolores de cabeza intensos. Presenta rigidez en el cuello. Presenta sensibilidad a la luz. Tiene dificultad para moverse o hablar. Tiene debilidad [employer], los brazos o las piernas. Presenta cambios en la audicin o la visin. Estos sntomas pueden representar un problema grave que constituye Radio broadcast assistant. No espere a ver si los sntomas desaparecen. Solicite atencin mdica de inmediato. Comunquese con el servicio de emergencias de su localidad (911 en los [place]). No conduzca por sus propios medios [employer]. Resumen El vrtigo es la sensacin de que usted o todo lo que lo rodea se mueve cuando en realidad eso no sucede. Su mdico le har estudios para tratar de Production assistant, radio causa del vrtigo. Siga las instrucciones de Theatre stage manager. Tal vez le indiquen que evite ciertas tareas, posiciones o movimientos. Comunquese con un mdico si los medicamentos no [employer] o si tiene fiebre, nuseas, vmitos o cambios en el comportamiento. Solicite ayuda de inmediato si tiene dolores de cabeza intensos o dificultad para hablar, o si experimenta problemas auditivos o de visin. Esta informacin no tiene Theme park manager el consejo del mdico. Asegrese de hacerle al mdico cualquier pregunta que tenga. Document Revised: 08/25/2020 Document Reviewed: 08/25/2020 Elsevier Patient Education  2022 [employer].

## 2021-09-13 NOTE — L&D Delivery Note (Signed)
Delivery Note Karina Murphy is a 41 y.o. G3P2002 at [redacted]w[redacted]d admitted for prolonged PROM for ~1wk.   GBS Status: POSITIVE/-- (12/12 2228) Maximum Maternal Temperature: 98.7  Labor course: Initial SVE: 2.5/60/-3. Augmentation with: Pitocin and AROM of forebag . She then progressed to complete.  ROM:~1wk  Called to come, pt complete/+2 w/ urge to push Baby delivered by RN just prior to my arrival Birth: At 0727 a viable female was delivered via spontaneous vaginal delivery (Presentation: LOA). Nuchal cord present: No.  Shoulders and body delivered in usual fashion. Infant placed directly on mom's abdomen for bonding/skin-to-skin, baby dried and stimulated. Cord clamped x 2 after 1 minute and cut by FOB.  Cord blood collected.   Pitocin infused rapidly IV per protocol. Placenta not forthcoming w/ gentle traction and maternal pushing efforts, turned pit off briefly,  then placenta delivered @ 0741. Fundus boggy and mod amt clots removed from LUS, firmed briefly then boggy again- cytotec buccal and rectal & TXA given. Fundus firm, few more clots removed from LUS and LUS firmed up. Placenta inspected and appears to be intact with a 3 VC.  Placenta/Cord with the following complications: none .  Cord pH: not done Sponge and instrument count were correct x2.  Intrapartum complications:  prolonged PROM (~1wk), afebrile, normal WBC, forebag AROM'd during labor Anesthesia:   IV pain meds, local for repair Episiotomy: none Lacerations:  2nd degree Suture Repair: 3.0 vicryl EBL (mL): 45   Infant: APGAR (1 MIN): 8   APGAR (5 MINS): 9   APGAR (10 MINS):    Infant weight: pending  Mom to postpartum.  Baby to Couplet care / Skin to Skin. Placenta to L&D   Plans to Breastfeed Contraception:  undecided Circumcision: declines  Note sent to [facility]: MCW for pp visit.  Cheral Marker CNM, [facility] 08/25/2022 8:03 AM

## 2021-10-08 ENCOUNTER — Encounter: Payer: Self-pay | Admitting: Nurse Practitioner

## 2021-10-12 ENCOUNTER — Encounter (HOSPITAL_COMMUNITY): Payer: Self-pay | Admitting: Emergency Medicine

## 2021-10-12 ENCOUNTER — Ambulatory Visit (HOSPITAL_COMMUNITY)
Admission: EM | Admit: 2021-10-12 | Discharge: 2021-10-12 | Disposition: A | Discharge status: Home / Self Care | Payer: Self-pay | Attending: Internal Medicine | Admitting: Internal Medicine

## 2021-10-12 ENCOUNTER — Other Ambulatory Visit: Payer: Self-pay

## 2021-10-12 DIAGNOSIS — J019 Acute sinusitis, unspecified: Secondary | ICD-10-CM

## 2021-10-12 MED ORDER — AMOXICILLIN-POT CLAVULANATE 875-125 MG PO TABS
1.0000 | ORAL_TABLET | Freq: Two times a day (BID) | ORAL | 0 refills | Status: DC
  Filled 2021-10-12: qty 14, 7d supply, fill #0

## 2021-10-12 MED ORDER — FLUTICASONE PROPIONATE 50 MCG/ACT NA SUSP
1.0000 | Freq: Every day | NASAL | 0 refills | Status: DC
  Filled 2021-10-12: qty 16, 30d supply, fill #0

## 2021-10-12 MED ORDER — IBUPROFEN 600 MG PO TABS
600.0000 mg | ORAL_TABLET | Freq: Four times a day (QID) | ORAL | 0 refills | Status: DC | PRN
  Filled 2021-10-12: qty 30, 8d supply, fill #0

## 2021-10-12 MED ORDER — KETOROLAC TROMETHAMINE 30 MG/ML IJ SOLN
30.0000 mg | Freq: Once | INTRAMUSCULAR | Status: AC
  Administered 2021-10-12: 30 mg via INTRAMUSCULAR

## 2021-10-12 MED ORDER — KETOROLAC TROMETHAMINE 30 MG/ML IJ SOLN
INTRAMUSCULAR | Status: AC
  Filled 2021-10-12: qty 1

## 2021-10-12 MED ORDER — SALINE SPRAY 0.65 % NA SOLN
1.0000 | NASAL | 0 refills | Status: DC | PRN
  Filled 2021-10-12: qty 104, fill #0

## 2021-10-12 MED ORDER — BENZONATATE 100 MG PO CAPS
100.0000 mg | ORAL_CAPSULE | Freq: Three times a day (TID) | ORAL | 0 refills | Status: DC | PRN
  Filled 2021-10-12: qty 30, 10d supply, fill #0

## 2021-10-12 NOTE — Discharge Instructions (Addendum)
Humidifier use will help with your symptoms Take medications as prescribed Use saline nasal spray to help with nasal congestion If symptoms worsen please return to urgent care to be reevaluated.

## 2021-10-12 NOTE — ED Triage Notes (Signed)
Pt presents with c/o cough, fever, ear pain, chills and headache x 2 weeks. Denies any recent exposure to flu and covid.

## 2021-10-12 NOTE — ED Provider Notes (Signed)
[place]    CSN: [id number] Arrival date & time: 10/12/21  1554      History   Chief Complaint No chief complaint on file.   HPI Karina Murphy is a 41 y.o. female with a history of rheumatoid arthritis comes to the urgent care with a 2-week history of left-sided facial pain, headache, chills and left ear pain.  Patient symptoms started 2 weeks ago with runny nose and nasal congestion.  Symptoms improved and then started worsening again over the past several days.  She endorses postnasal drainage and cough.  Cough is nonproductive.  Patient says pain is of moderate severity, constant and over the maxillary and frontal sinuses.  No ringing in the ears.  No hearing problems.  No ear discharge.  No shortness of breath or wheezing. 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      Home Medications    Prior to Admission medications   Medication Sig Start Date End Date Taking? Authorizing Provider  amoxicillin-clavulanate (AUGMENTIN) 875-125 MG tablet Take 1 tablet by mouth every 12 (twelve) hours. 10/12/21  Yes Wilmar Prabhakar, Myrene Galas, MD  benzonatate (TESSALON) 100 MG capsule Take 1 capsule (100 mg total) by mouth 3 (three) times daily as needed for cough. 10/12/21  Yes Bobbie Valletta, Myrene Galas, MD  fluticasone (FLONASE) 50 MCG/ACT nasal spray Place 1 spray into both nostrils daily. 10/12/21  Yes Sugar Vanzandt, Myrene Galas, MD  ibuprofen (ADVIL)  600 MG tablet Take 1 tablet (600 mg total) by mouth every 6 (six) hours as needed. 10/12/21  Yes Leilene Diprima, Myrene Galas, MD  sodium chloride (OCEAN) 0.65 % SOLN nasal spray Place 1 spray into both nostrils as needed for congestion. 10/12/21  Yes Dayton Kenley, Myrene Galas, MD  ondansetron (ZOFRAN ODT) 4 MG disintegrating tablet Take 1-2 tablets (4-8 mg total) by mouth every 8 (eight) hours as needed for nausea or vomiting. 06/24/21   Gildardo Pounds, NP  pantoprazole (PROTONIX) 40 MG tablet Take 1 tablet (40 mg total) by mouth daily. 06/24/21   Gildardo Pounds, NP  triamcinolone cream (KENALOG) 0.1 % Apply 1 application topically 2 (two) times daily. 08/24/21   Charlott Rakes, MD  sertraline (ZOLOFT) 25 MG tablet Take 1 tablet (25 mg total) by mouth daily. Patient not taking: Reported on 07/31/2020 03/14/20 12/12/20  Gildardo Pounds, NP    Family History Family History  Problem Relation Age of Onset   Diabetes Mother    Hypertension Mother    Stomach cancer Neg Hx    Colon cancer Neg Hx     Social History Social History   Tobacco Use   Smoking status: Never   Smokeless tobacco: Never  Vaping Use   Vaping Use: Never used  Substance Use Topics   Alcohol use: No   Drug use: No  Allergies   Pineapple   Review of Systems Review of Systems  Constitutional: Negative.   HENT:  Positive for congestion, postnasal drip, sinus pressure and sinus pain. Negative for rhinorrhea, sneezing and sore throat.   Respiratory: Negative.    Gastrointestinal: Negative.   Genitourinary: Negative.     Physical Exam Triage Vital Signs ED Triage Vitals  Enc Vitals Group     BP 10/12/21 1733 (!) 138/91     Pulse Rate 10/12/21 1733 69     Resp 10/12/21 1733 16     Temp 10/12/21 1733 98.8 F (37.1 C)     Temp Source 10/12/21 1733 Oral     SpO2 10/12/21 1733 99 %     Weight 10/12/21 1734 201 lb 1 oz (91.2 kg)     Height 10/12/21 1734 5\' 4"  (1.626 m)     Head Circumference --      Peak Flow --      Pain  Score 10/12/21 1733 10     Pain Loc --      Pain Edu? --      Excl. in [place]? --    No data found.  Updated Vital Signs BP (!) 138/91 (BP Location: Left Arm)    Pulse 69    Temp 98.8 F (37.1 C) (Oral)    Resp 16    Ht 5\' 4"  (1.626 m)    Wt 91.2 kg    SpO2 99%    BMI 34.51 kg/m   Visual Acuity Right Eye Distance:   Left Eye Distance:   Bilateral Distance:    Right Eye Near:   Left Eye Near:    Bilateral Near:     Physical Exam Vitals and nursing note reviewed.  Constitutional:      General: She is in acute distress.     Appearance: She is not ill-appearing or diaphoretic.  HENT:     Right Ear: Tympanic membrane normal.     Left Ear: Tympanic membrane normal.     Nose:     Comments: Tenderness over the left maxillary sinus. Cardiovascular:     Rate and Rhythm: Normal rate and regular rhythm.     Pulses: Normal pulses.     Heart sounds: Normal heart sounds.  Pulmonary:     Effort: Pulmonary effort is normal.     Breath sounds: Normal breath sounds.  Abdominal:     General: Bowel sounds are normal.     Palpations: Abdomen is soft.  Neurological:     Mental Status: She is alert.     UC Treatments / Results  Labs (all labs ordered are listed, but only abnormal results are displayed) Labs Reviewed - No data to display  EKG   Radiology No results found.  Procedures Procedures (including critical care time)  Medications Ordered in UC Medications  ketorolac (TORADOL) 30 MG/ML injection 30 mg (30 mg Intramuscular Given 10/12/21 1816)    Initial Impression / Assessment and Plan / UC Course  I have reviewed the triage vital signs and the nursing notes.  Pertinent labs & imaging results that were available during my care of the patient were reviewed by me and considered in my medical decision making (see chart for details).     1.  Acute sinusitis with symptoms greater than 10 days: Augmentin 1 tablet twice daily for 7 days Fluticasone nasal spray Humidifier  use is recommended Saline nasal spray will help with nasal congestion Vapor rub use will help as well Discontinue  Sudafed use. Maintain adequate hydration Return to urgent care if symptoms worsen Ibuprofen as needed for pain Tessalon Perles as needed for cough. Final Clinical Impressions(s) / UC Diagnoses   Final diagnoses:  Acute sinusitis with symptoms > 10 days     Discharge Instructions      Humidifier use will help with your symptoms Take medications as prescribed Use saline nasal spray to help with nasal congestion If symptoms worsen please return to urgent care to be reevaluated.    ED Prescriptions     Medication Sig Dispense Auth. Provider   sodium chloride (OCEAN) 0.65 % SOLN nasal spray Place 1 spray into both nostrils as needed for congestion. 104 mL Jeanann Balinski, Myrene Galas, MD   fluticasone [facility]) 50 MCG/ACT nasal spray Place 1 spray into both nostrils daily. 16 g Chase Picket, MD   amoxicillin-clavulanate (AUGMENTIN) 875-125 MG tablet Take 1 tablet by mouth every 12 (twelve) hours. 14 tablet Tulio Facundo, Myrene Galas, MD   ibuprofen (ADVIL) 600 MG tablet Take 1 tablet (600 mg total) by mouth every 6 (six) hours as needed. 30 tablet Travonne Schowalter, Myrene Galas, MD   benzonatate (TESSALON) 100 MG capsule Take 1 capsule (100 mg total) by mouth 3 (three) times daily as needed for cough. 30 capsule Myrka Sylva, Myrene Galas, MD      PDMP not reviewed this encounter.   Chase Picket, MD 10/12/21 1840

## 2021-10-13 ENCOUNTER — Other Ambulatory Visit: Payer: Self-pay

## 2021-11-02 ENCOUNTER — Other Ambulatory Visit: Payer: Self-pay

## 2021-11-02 ENCOUNTER — Emergency Department (HOSPITAL_COMMUNITY)
Admission: EM | Admit: 2021-11-02 | Discharge: 2021-11-03 | Disposition: A | Discharge status: Home / Self Care | Payer: Self-pay | Attending: Emergency Medicine | Admitting: Emergency Medicine

## 2021-11-02 ENCOUNTER — Encounter (HOSPITAL_COMMUNITY): Payer: Self-pay

## 2021-11-02 ENCOUNTER — Ambulatory Visit (HOSPITAL_COMMUNITY)
Admission: EM | Admit: 2021-11-02 | Discharge: 2021-11-02 | Disposition: A | Discharge status: Home / Self Care | Payer: Self-pay | Attending: Family Medicine | Admitting: Family Medicine

## 2021-11-02 ENCOUNTER — Encounter (HOSPITAL_COMMUNITY): Payer: Self-pay | Admitting: Emergency Medicine

## 2021-11-02 DIAGNOSIS — R1013 Epigastric pain: Secondary | ICD-10-CM | POA: Insufficient documentation

## 2021-11-02 DIAGNOSIS — R112 Nausea with vomiting, unspecified: Secondary | ICD-10-CM | POA: Insufficient documentation

## 2021-11-02 DIAGNOSIS — R519 Headache, unspecified: Secondary | ICD-10-CM | POA: Insufficient documentation

## 2021-11-02 DIAGNOSIS — K920 Hematemesis: Secondary | ICD-10-CM

## 2021-11-02 DIAGNOSIS — R195 Other fecal abnormalities: Secondary | ICD-10-CM | POA: Insufficient documentation

## 2021-11-02 LAB — CBC WITH DIFFERENTIAL/PLATELET
Abs Immature Granulocytes: 0.02 10*3/uL (ref 0.00–0.07)
Basophils Absolute: 0.1 10*3/uL (ref 0.0–0.1)
Basophils Relative: 1 %
Eosinophils Absolute: 0.2 10*3/uL (ref 0.0–0.5)
Eosinophils Relative: 3 %
HCT: 41.6 % (ref 36.0–46.0)
Hemoglobin: 13.2 g/dL (ref 12.0–15.0)
Immature Granulocytes: 0 %
Lymphocytes Relative: 37 %
Lymphs Abs: 2.3 10*3/uL (ref 0.7–4.0)
MCH: 28 pg (ref 26.0–34.0)
MCHC: 31.7 g/dL (ref 30.0–36.0)
MCV: 88.3 fL (ref 80.0–100.0)
Monocytes Absolute: 0.5 10*3/uL (ref 0.1–1.0)
Monocytes Relative: 8 %
Neutro Abs: 3.1 10*3/uL (ref 1.7–7.7)
Neutrophils Relative %: 51 %
Platelets: 312 10*3/uL (ref 150–400)
RBC: 4.71 MIL/uL (ref 3.87–5.11)
RDW: 14.3 % (ref 11.5–15.5)
WBC: 6.2 10*3/uL (ref 4.0–10.5)
nRBC: 0 % (ref 0.0–0.2)

## 2021-11-02 LAB — BASIC METABOLIC PANEL
Anion gap: 10 (ref 5–15)
BUN: 9 mg/dL (ref 6–20)
CO2: 21 mmol/L — ABNORMAL LOW (ref 22–32)
Calcium: 9.4 mg/dL (ref 8.9–10.3)
Chloride: 102 mmol/L (ref 98–111)
Creatinine, Ser: 0.72 mg/dL (ref 0.44–1.00)
GFR, Estimated: >60 mL/min (ref >60)
Glucose, Bld: 105 mg/dL — ABNORMAL HIGH (ref 70–99)
Potassium: 3.7 mmol/L (ref 3.5–5.1)
Sodium: 133 mmol/L — ABNORMAL LOW (ref 135–145)

## 2021-11-02 LAB — ABO/RH: ABO/RH(D): A POS

## 2021-11-02 LAB — I-STAT BETA HCG BLOOD, ED (MC, WL, AP ONLY): I-stat hCG, quantitative: <5 m[IU]/mL (ref <5)

## 2021-11-02 LAB — TYPE AND SCREEN
ABO/RH(D): A POS
Antibody Screen: NEGATIVE

## 2021-11-02 NOTE — Discharge Instructions (Addendum)
Please go to the emergency room immediately for further evaluation and treatment.  I am concerned you have a bleeding ulcer  (Por favor, vayase Ud a la sala [place]. Estoy preocupado que Ud tenga una ulcera lo que esta sangrando)

## 2021-11-02 NOTE — ED Provider Notes (Signed)
[place]    CSN: [id number] Arrival date & time: 11/02/21  1839      History   Chief Complaint Chief Complaint  Patient presents with   Abdominal Pain   Emesis   Headache    HPI Karina Murphy is a 41 y.o. female.    Abdominal Pain Associated symptoms: vomiting   Emesis Associated symptoms: abdominal pain and headaches   Headache Associated symptoms: abdominal pain and vomiting   Here for vomiting coffee grounds and feeling dizzy.  She states she has been vomiting for months.  Then on February 16 she threw up after eating and had coffee grounds and flecks of blood in her emesis also on February 17 and 18 she had similar appearance to her emesis.  She has also been dizzy some.  No syncope.  She has had loose stools and some melena.  No fever.  States she is having difficulty passing even just a little bit of water when she swallows   Has medical history significant for rheumatoid arthritis.  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      Home Medications    Prior to Admission medications   Medication Sig Start Date End Date Taking? Authorizing Provider  fluticasone (FLONASE) 50 MCG/ACT nasal spray Place 1 spray into both nostrils daily. 10/12/21   Lamptey, Myrene Galas, MD  ondansetron (ZOFRAN ODT) 4 MG disintegrating tablet Take 1-2 tablets (4-8 mg total) by mouth  every 8 (eight) hours as needed for nausea or vomiting. 06/24/21   Gildardo Pounds, NP  pantoprazole (PROTONIX) 40 MG tablet Take 1 tablet (40 mg total) by mouth daily. 06/24/21   Gildardo Pounds, NP  sodium chloride (OCEAN) 0.65 % SOLN nasal spray Place 1 spray into both nostrils as needed for congestion. 10/12/21   LampteyMyrene Galas, MD  triamcinolone cream (KENALOG) 0.1 % Apply 1 application topically 2 (two) times daily. 08/24/21   Charlott Rakes, MD  sertraline (ZOLOFT) 25 MG tablet Take 1 tablet (25 mg total) by mouth daily. Patient not taking: Reported on 07/31/2020 03/14/20 12/12/20  Gildardo Pounds, NP    Family History Family History  Problem Relation Age of Onset   Diabetes Mother    Hypertension Mother    Stomach cancer Neg Hx    Colon cancer Neg Hx     Social History Social History   Tobacco Use   Smoking status: Never   Smokeless tobacco: Never  Vaping Use   Vaping Use: Never used  Substance Use Topics   Alcohol use: No   Drug use: No     Allergies   Pineapple   Review of Systems Review of Systems  Gastrointestinal:  Positive for abdominal pain and vomiting.  Neurological:  Positive for headaches.    Physical  Exam Triage Vital Signs ED Triage Vitals  Enc Vitals Group     BP 11/02/21 1955 121/79     Pulse Rate 11/02/21 1955 68     Resp 11/02/21 1955 17     Temp 11/02/21 1955 98.5 F (36.9 C)     Temp Source 11/02/21 1955 Oral     SpO2 11/02/21 1955 99 %     Weight 11/02/21 1954 201 lb 1 oz (91.2 kg)     Height 11/02/21 1954 5\' 4"  (1.626 m)     Head Circumference --      Peak Flow --      Pain Score 11/02/21 1954 8     Pain Loc --      Pain Edu? --      Excl. in [place]? --    No data found.  Updated Vital Signs BP 121/79 (BP Location: Right Arm)    Pulse 68    Temp 98.5 F (36.9 C) (Oral)    Resp 17    Ht 5\' 4"  (1.626 m)    Wt 91.2 kg    SpO2 99%    BMI 34.51 kg/m   Visual Acuity Right Eye Distance:   Left Eye Distance:   Bilateral  Distance:    Right Eye Near:   Left Eye Near:    Bilateral Near:     Physical Exam Vitals reviewed.  Constitutional:      General: She is not in acute distress.    Appearance: She is not ill-appearing, toxic-appearing or diaphoretic.  HENT:     Mouth/Throat:     Mouth: Mucous membranes are moist.     Comments: No pallor Eyes:     Extraocular Movements: Extraocular movements intact.     Pupils: Pupils are equal, round, and reactive to light.  Cardiovascular:     Rate and Rhythm: Normal rate and regular rhythm.     Heart sounds: No murmur heard. Pulmonary:     Breath sounds: Normal breath sounds.  Abdominal:     Palpations: Abdomen is soft.     Tenderness: There is abdominal tenderness (epigastrium).  Musculoskeletal:     Cervical back: Neck supple.  Lymphadenopathy:     Cervical: No cervical adenopathy.  Skin:    Coloration: Skin is not jaundiced or pale.  Neurological:     General: No focal deficit present.     Mental Status: She is alert and oriented to person, place, and time.  Psychiatric:        Behavior: Behavior normal.     UC Treatments / Results  Labs (all labs ordered are listed, but only abnormal results are displayed) Labs Reviewed - No data to display  EKG   Radiology No results found.  Procedures Procedures (including critical care time)  Medications Ordered in UC Medications - No data to display  Initial Impression / Assessment and Plan / UC Course  I have reviewed the triage vital signs and the nursing notes.  Pertinent labs & imaging results that were available during my care of the patient were reviewed by me and considered in my medical decision making (see chart for details).     Discussed with the patient that she needs to be in the emergency room, where she can have more urgent lab evaluation and treatment done Final Clinical Impressions(s) / UC Diagnoses   Final diagnoses:  Coffee ground emesis     Discharge Instructions       Please go to the emergency room  immediately for further evaluation and treatment.  I am concerned you have a bleeding ulcer  (Por favor, vayase Ud a la sala de [place]. Estoy preocupado que Ud tenga una ulcera lo que esta sangrando)     ED Prescriptions   None    PDMP not reviewed this encounter.   Barrett Henle, MD 11/02/21 2011

## 2021-11-02 NOTE — ED Triage Notes (Signed)
Pt reports abdominal pain, coffee ground emesis and headache x 5 days.

## 2021-11-02 NOTE — ED Provider Triage Note (Signed)
Emergency Medicine Provider Triage Evaluation Note  Luiz OchoaLisseth Murphy , a 41 y.o. female  was evaluated in triage.  Pt complains of abdominal pain times 5 days.  She also states that she has had numerous episodes of coffee-ground emesis, and dark tarry stools.  She originally presented to urgent care and they sent her to the ER for further evaluation.  She states this is never happened to her before.  She is not on chronic anticoagulation.  Review of Systems  Positive: As above Negative: Fevers, chills  Physical Exam  BP 136/75    Pulse 72    Temp 99.5 F (37.5 C)    Resp 18    SpO2 100%  Gen:   Awake, no distress   Resp:  Normal effort  MSK:   Moves extremities without difficulty  Other:    Medical Decision Making  Medically screening exam initiated at 9:01 PM.  Appropriate orders placed.  Hatsuko Murphy was informed that the remainder of the evaluation will be completed by another provider, this initial triage assessment does not replace that evaluation, and the importance of remaining in the ED until their evaluation is complete.  Will obtain labs and type and screen   Oluwasemilore Pascuzzi T, PA-C 11/02/21 2101

## 2021-11-02 NOTE — ED Notes (Addendum)
Patient is being discharged from the Urgent Care and sent to the Emergency Department via POV . Per Dr.Banister, patient is in need of higher level of care due to abdominal pain, coffee ground emesis and black tarry stools. Patient is aware and verbalizes understanding of plan of care.  Vitals:   11/02/21 1955  BP: 121/79  Pulse: 68  Resp: 17  Temp: 98.5 F (36.9 C)  SpO2: 99%

## 2021-11-02 NOTE — ED Triage Notes (Signed)
Interpreter used for triage.   Sent from [facility] for evaluation of generalized abdominal pain since Thursday with coffee ground emesis and dark tarry stools.

## 2021-11-03 ENCOUNTER — Other Ambulatory Visit: Payer: Self-pay

## 2021-11-03 LAB — POC OCCULT BLOOD, ED: Fecal Occult Bld: NEGATIVE

## 2021-11-03 MED ORDER — DIPHENHYDRAMINE HCL 50 MG/ML IJ SOLN
25.0000 mg | Freq: Once | INTRAMUSCULAR | Status: AC
Start: 2021-11-03 — End: 2021-11-03
  Administered 2021-11-03: 25 mg via INTRAVENOUS
  Filled 2021-11-03: qty 1

## 2021-11-03 MED ORDER — ALUM & MAG HYDROXIDE-SIMETH 200-200-20 MG/5ML PO SUSP
30.0000 mL | Freq: Once | ORAL | Status: AC
  Administered 2021-11-03: 30 mL via ORAL
  Filled 2021-11-03: qty 30

## 2021-11-03 MED ORDER — METOCLOPRAMIDE HCL 5 MG/ML IJ SOLN
10.0000 mg | Freq: Once | INTRAMUSCULAR | Status: AC
Start: 2021-11-03 — End: 2021-11-03
  Administered 2021-11-03: 10 mg via INTRAVENOUS
  Filled 2021-11-03: qty 2

## 2021-11-03 MED ORDER — SODIUM CHLORIDE 0.9 % IV BOLUS
1000.0000 mL | Freq: Once | INTRAVENOUS | Status: AC
  Administered 2021-11-03: 1000 mL via INTRAVENOUS

## 2021-11-03 MED ORDER — ONDANSETRON HCL 4 MG PO TABS
4.0000 mg | ORAL_TABLET | Freq: Four times a day (QID) | ORAL | 0 refills | Status: DC
  Filled 2021-11-03: qty 12, 3d supply, fill #0

## 2021-11-03 MED ORDER — LIDOCAINE VISCOUS HCL 2 % MT SOLN
15.0000 mL | Freq: Once | OROMUCOSAL | Status: AC
  Administered 2021-11-03: 15 mL via ORAL
  Filled 2021-11-03: qty 15

## 2021-11-03 MED ORDER — SUCRALFATE 1 G PO TABS
1.0000 g | ORAL_TABLET | Freq: Three times a day (TID) | ORAL | 0 refills | Status: DC
  Filled 2021-11-03 – 2021-11-10 (×2): qty 40, 10d supply, fill #0

## 2021-11-03 MED ORDER — PANTOPRAZOLE SODIUM 40 MG PO TBEC
40.0000 mg | DELAYED_RELEASE_TABLET | Freq: Every day | ORAL | 3 refills | Status: DC
  Filled 2021-11-03 – 2021-11-10 (×2): qty 30, 30d supply, fill #0

## 2021-11-03 MED ORDER — ONDANSETRON HCL 4 MG/2ML IJ SOLN
4.0000 mg | Freq: Once | INTRAMUSCULAR | Status: AC
  Administered 2021-11-03: 4 mg via INTRAVENOUS
  Filled 2021-11-03: qty 2

## 2021-11-03 NOTE — ED Provider Notes (Signed)
[facility] EMERGENCY DEPARTMENT Provider Note   CSN: [id number] Arrival date & time: 11/02/21  2038     History  Chief Complaint  Patient presents with   Headache   Dark stool    Karina Murphy is a 41 y.o. female.  Patient presents to emergency department for evaluation of abdominal pain.  Patient reports that she has been having upper abdominal pain, nausea and vomiting for 5 days.  Patient reports that the vomit has looked like coffee grounds and her stools are dark.  She has a history of upper GI bleed.  She was sent to the ER from urgent care for further evaluation.      Home Medications Prior to Admission medications   Medication Sig Start Date End Date Taking? Authorizing Provider  ibuprofen (ADVIL) 200 MG tablet Take 200 mg by mouth every 6 (six) hours as needed for headache or moderate pain.   Yes [provider]  Naproxen-Liniment (FLANAX PAIN RELIEF CO) Take 1-2 tablets by mouth every 4 (four) hours as needed (pain/headache).   Yes [provider]  ondansetron (ZOFRAN) 4 MG tablet Take 1 tablet (4 mg total) by mouth every 6 (six) hours. 11/03/21  Yes Doyle Tegethoff, Gwenyth Allegra, MD  pantoprazole (PROTONIX) 40 MG tablet Take 1 tablet (40 mg total) by mouth daily. 11/03/21  Yes Domanick Cuccia, Gwenyth Allegra, MD  sucralfate (CARAFATE) 1 g tablet Take 1 tablet (1 g total) by mouth 4 (four) times daily -  with meals and at bedtime. 11/03/21  Yes Kilani Joffe, Gwenyth Allegra, MD  sertraline (ZOLOFT) 25 MG tablet Take 1 tablet (25 mg total) by mouth daily. Patient not taking: Reported on 07/31/2020 03/14/20 12/12/20  Gildardo Pounds, NP      Allergies    Pineapple    Review of Systems   Review of Systems  Gastrointestinal:  Positive for abdominal pain, nausea and vomiting.   Physical Exam Updated Vital Signs BP 117/65    Pulse 69    Temp 99.5 F (37.5 C)    Resp 17    LMP 10/10/2021 (Exact Date)    SpO2 100%  Physical Exam Vitals and nursing note  reviewed.  Constitutional:      General: She is not in acute distress.    Appearance: She is well-developed.  HENT:     Head: Normocephalic and atraumatic.     Mouth/Throat:     Mouth: Mucous membranes are moist.  Eyes:     General: Vision grossly intact. Gaze aligned appropriately.     Extraocular Movements: Extraocular movements intact.     Conjunctiva/sclera: Conjunctivae normal.  Cardiovascular:     Rate and Rhythm: Normal rate and regular rhythm.     Pulses: Normal pulses.     Heart sounds: Normal heart sounds, S1 normal and S2 normal. No murmur heard.   No friction rub. No gallop.  Pulmonary:     Effort: Pulmonary effort is normal. No respiratory distress.     Breath sounds: Normal breath sounds.  Abdominal:     General: Bowel sounds are normal.     Palpations: Abdomen is soft.     Tenderness: There is abdominal tenderness in the epigastric area. There is no guarding or rebound.     Hernia: No hernia is present.  Musculoskeletal:        General: No swelling.     Cervical back: Full passive range of motion without pain, normal range of motion and neck supple. No spinous process tenderness or  muscular tenderness. Normal range of motion.     Right lower leg: No edema.     Left lower leg: No edema.  Skin:    General: Skin is warm and dry.     Capillary Refill: Capillary refill takes less than 2 seconds.     Findings: No ecchymosis, erythema, rash or wound.  Neurological:     General: No focal deficit present.     Mental Status: She is alert and oriented to person, place, and time.     GCS: GCS eye subscore is 4. GCS verbal subscore is 5. GCS motor subscore is 6.     Cranial Nerves: Cranial nerves 2-12 are intact.     Sensory: Sensation is intact.     Motor: Motor function is intact.     Coordination: Coordination is intact.  Psychiatric:        Attention and Perception: Attention normal.        Mood and Affect: Mood normal.        Speech: Speech normal.         Behavior: Behavior normal.    ED Results / Procedures / Treatments   Labs (all labs ordered are listed, but only abnormal results are displayed) Labs Reviewed  BASIC METABOLIC PANEL - Abnormal; Notable for the following components:      Result Value   Sodium 133 (*)    CO2 21 (*)    Glucose, Bld 105 (*)    All other components within normal limits  CBC WITH DIFFERENTIAL/PLATELET  OCCULT BLOOD X 1 CARD TO LAB, STOOL  I-STAT BETA HCG BLOOD, ED ([facility], [facility], [facility] ONLY)  POC OCCULT BLOOD, ED  TYPE AND SCREEN  ABO/RH    EKG None  Radiology No results found.  Procedures Procedures    Medications Ordered in ED Medications  sodium chloride 0.9 % bolus 1,000 mL (0 mLs Intravenous Stopped 11/03/21 0639)  ondansetron (ZOFRAN) injection 4 mg (4 mg Intravenous Given 11/03/21 0429)  metoCLOPramide (REGLAN) injection 10 mg (10 mg Intravenous Given 11/03/21 0429)  diphenhydrAMINE (BENADRYL) injection 25 mg (25 mg Intravenous Given 11/03/21 0429)  alum & mag hydroxide-simeth (MAALOX/MYLANTA) 200-200-20 MG/5ML suspension 30 mL (30 mLs Oral Given 11/03/21 0428)    And  lidocaine (XYLOCAINE) 2 % viscous mouth solution 15 mL (15 mLs Oral Given 11/03/21 0428)    ED Course/ Medical Decision Making/ A&P                           Medical Decision Making Risk OTC drugs. Prescription drug management.   Patient presents to the emergency department for evaluation of abdominal pain.  Patient reports coffee-ground emesis and dark stools.  Differential diagnosis peptic ulcer disease, gastritis, small bowel obstruction  Reviewing her records from previous visits reveals recent work-up for similar symptoms.  Additionally, she has been admitted in the past for upper GI bleeding and had upper endoscopy that showed gastropathy, no other findings.  Patient treated symptomatically and has improved.  Hemoglobin is stable.  Vital signs are stable.  Hemoccult was negative.  Doubt any significant GI bleeding at  this time.  Will discharge with treatment for gastritis and follow-up with GI.         Final Clinical Impression(s) / ED Diagnoses Final diagnoses:  Epigastric pain    Rx / DC Orders ED Discharge Orders          Ordered    Ambulatory referral to Gastroenterology  11/03/21 0642    pantoprazole (PROTONIX) 40 MG tablet  Daily        11/03/21 0642    ondansetron (ZOFRAN) 4 MG tablet  Every 6 hours        11/03/21 0642    sucralfate (CARAFATE) 1 g tablet  3 times daily with meals & bedtime        11/03/21 [id number]              Orpah Greek, MD 11/03/21 [phone]

## 2021-11-10 ENCOUNTER — Other Ambulatory Visit: Payer: Self-pay

## 2021-11-20 ENCOUNTER — Ambulatory Visit: Payer: Self-pay | Attending: Nurse Practitioner

## 2021-11-20 ENCOUNTER — Other Ambulatory Visit: Payer: Self-pay

## 2021-12-01 ENCOUNTER — Other Ambulatory Visit (INDEPENDENT_AMBULATORY_CARE_PROVIDER_SITE_OTHER): Payer: Self-pay

## 2021-12-01 ENCOUNTER — Ambulatory Visit (INDEPENDENT_AMBULATORY_CARE_PROVIDER_SITE_OTHER): Payer: Self-pay | Admitting: Physician Assistant

## 2021-12-01 ENCOUNTER — Encounter: Payer: Self-pay | Admitting: Physician Assistant

## 2021-12-01 VITALS — BP 128/84 | HR 68 | Ht 64.0 in | Wt 190.0 lb

## 2021-12-01 DIAGNOSIS — R1010 Upper abdominal pain, unspecified: Secondary | ICD-10-CM

## 2021-12-01 DIAGNOSIS — R112 Nausea with vomiting, unspecified: Secondary | ICD-10-CM

## 2021-12-01 LAB — COMPREHENSIVE METABOLIC PANEL
ALT: 13 U/L (ref 0–35)
AST: 14 U/L (ref 0–37)
Albumin: 4 g/dL (ref 3.5–5.2)
Alkaline Phosphatase: 56 U/L (ref 39–117)
BUN: 12 mg/dL (ref 6–23)
CO2: 26 mEq/L (ref 19–32)
Calcium: 9.3 mg/dL (ref 8.4–10.5)
Chloride: 105 mEq/L (ref 96–112)
Creatinine, Ser: 0.71 mg/dL (ref 0.40–1.20)
GFR: 107.32 mL/min (ref >60.00)
Glucose, Bld: 104 mg/dL — ABNORMAL HIGH (ref 70–99)
Potassium: 3.8 mEq/L (ref 3.5–5.1)
Sodium: 138 mEq/L (ref 135–145)
Total Bilirubin: 0.7 mg/dL (ref 0.2–1.2)
Total Protein: 7.3 g/dL (ref 6.0–8.3)

## 2021-12-01 LAB — LIPASE: Lipase: 7 U/L — ABNORMAL LOW (ref 11.0–59.0)

## 2021-12-01 NOTE — Progress Notes (Signed)
? ?Subjective:  ? ? Patient ID: Karina Murphy, female    DOB: 10/21/1980, 41 y.o.   MRN: [id number] ? ?HPI ?Karina Murphy is a pleasant 41 year old Hispanic female, established with Dr. Silverio Decamp, and last seen here in 2019 with nausea vomiting and abdominal pain. ?Patient comes in today after recent ER visit on 11/02/2021 with complaints of epigastric pain, dark stool and nausea and vomiting for 5 days.  She was documented to be heme negative during the ER visit.  CBC showed WBC of 6.2/hemoglobin 13.2/hematocrit 41.6/MCV 88/platelets 312.  She was allowed discharged home and was asked to follow-up with GI.  She was started on Protonix 40 mg once daily and was given Zofran 4 nausea.  She did not have abdominal imaging. ?Review of her chart shows she had an H. pylori breath test done by her PCP in October 2022 which was negative. ?She is status post previous cholecystectomy, has history of rheumatoid arthritis and anxiety. ?She underwent EGD at our office in 2017 due to complaints of epigastric pain nausea and vomiting and this showed mild antral gastritis and was otherwise negative.  Biopsies showed benign gastric mucosa no H. pylori. ?Around that same time she had undergone rather extensive work-up with gastric emptying scan which actually showed rapid emptying, CCK HIDA scan which was normal, ultrasound negative with the exception of the fatty liver. ? ?She says she feels a little better since the emergency room visit and has been taking the Zofran as needed and Protonix once daily.  On further questioning she has been taking naproxen, 2 at bedtime for chronic pain in her hands. ?She says she continues to have abdominal discomfort and points to the hypogastrium.  The pain seems to be worse after eating and is frequently associated with nausea.  She says she has been vomiting almost daily over the past 3 months she complains of a bloated sensation after eating.  She has not had any coffee-ground emesis and has not  noticed any further dark stools.,  Denies any Pepto-Bismol use. ?No cannabis use and no EtOH. ? ?Review of Systems Pertinent positive and negative review of systems were noted in the above HPI section.  All other review of systems was otherwise negative.  ? ?Outpatient Encounter Medications as of 12/01/2021  ?Medication Sig  ? ibuprofen (ADVIL) 200 MG tablet Take 200 mg by mouth every 6 (six) hours as needed for headache or moderate pain.  ? Naproxen-Liniment (FLANAX PAIN RELIEF CO) Take 1-2 tablets by mouth every 4 (four) hours as needed (pain/headache).  ? ondansetron (ZOFRAN) 4 MG tablet Take 1 tablet (4 mg total) by mouth every 6 (six) hours.  ? pantoprazole (PROTONIX) 40 MG tablet Take 1 tablet (40 mg total) by mouth daily.  ? sucralfate (CARAFATE) 1 g tablet Take 1 tablet (1 g total) by mouth 4 (four) times daily -  with meals and at bedtime.  ? [DISCONTINUED] sertraline (ZOLOFT) 25 MG tablet Take 1 tablet (25 mg total) by mouth daily. (Patient not taking: Reported on 07/31/2020)  ? ?No facility-administered encounter medications on file as of 12/01/2021.  ? ?Allergies  ?Allergen Reactions  ? Pineapple Itching  ? Morphine Palpitations  ? ?Patient Active Problem List  ? Diagnosis Date Noted  ? Biliary colic   ? 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 Weakness 11/23/2016  ? Breast pain, left 09/17/2016  ? Abdominal pain, epigastric 07/27/2016  ? Rheumatoid arthritis ([place]) 02/12/2014  ? Obese 12/26/2013  ? ?Social History  ? ?Socioeconomic  History  ? Marital status: Single  ?  Spouse name: Not on file  ? Number of children: 2  ? Years of education: Not on file  ? Highest education level: Not on file  ?Occupational History  ? Not on file  ?Tobacco Use  ? Smoking status: Never  ? Smokeless tobacco: Never  ?Vaping Use  ? Vaping Use: Never used  ?Substance and Sexual Activity  ? Alcohol use: No  ? Drug use: No  ? Sexual activity: Not Currently  ?  Birth control/protection: Condom  ?Other Topics Concern  ? Not on  file  ?Social History Narrative  ? Completed 2nd grade.   ? ?Social Determinants of Health  ? ?Financial Resource Strain: Not on file  ?Food Insecurity: Not on file  ?Transportation Needs: Not on file  ?Physical Activity: Not on file  ?Stress: Not on file  ?Social Connections: Not on file  ?Intimate Partner Violence: Not on file  ? ? ?Karina Murphy's family history includes Diabetes in her mother; Hypertension in her mother. ? ? ?   ?Objective:  ?  ?Vitals:  ? 12/01/21 1134  ?BP: 128/84  ?Pulse: 68  ?SpO2: 98%  ? ? ?Physical Exam Well-developed well-nourished Hispanic female in no acute distress.  Height, Weight, 190 BMI 32.6 accompanied by interpreter ? ?HEENT; nontraumatic normocephalic, EOMI, PE R LA, sclera anicteric. ?Oropharynx; not examined today ?Neck; supple, no JVD ?Cardiovascular; regular rate and rhythm with S1-S2, no murmur rub or gallop ?Pulmonary; Clear bilaterally ?Abdomen; soft, she is tender in the hypogastrium and above the umbilicus there may be a very small supraumbilical hernia, nondistended, no palpable mass or hepatosplenomegaly, bowel sounds are active ?Rectal; not done today ?Skin; benign exam, no jaundice rash or appreciable lesions ?Extremities; no clubbing cyanosis or edema skin warm and dry ?Neuro/Psych; alert and oriented x4, grossly nonfocal mood and affect appropriate  ? ? ? ?   ?Assessment & Plan:  ? ?#18 41 year old non-English-speaking Hispanic female with recurrent upper abdominal pain, nausea vomiting with symptoms present now over the past 3 to 4 weeks.  Patient says she has been having almost daily vomiting over the past 3 months. ?There was concern at the time of ER visit on 11/02/2021 for melena but stool tested heme negative and hemoglobin normal at 13.2 ? ?Prior work-up in 2017 with EGD for similar symptoms showed mild antral gastritis otherwise negative, no H. pylori. ?She is status postcholecystectomy. ? ?Etiology of her current symptoms is not entirely clear.  She has  been taking chronic NSAIDs and certainly may have an NSAID induced gastropathy or peptic ulcer disease.  Prior testing for H. pylori negative by breath test fall 2022 and negative by biopsy 2017 ?She has some rather focal tenderness in the supraumbilical area she may have a small supraumbilical hernia. ?Doubt intermittent incarceration but cannot rule out, rule out other intra-abdominal inflammatory process ? ?#2 rheumatoid arthritis ?#3   Status post cholecystectomy ? ?Plan; c-Met and lipase today ?Patient will be scheduled for CT of the abdomen pelvis with contrast.  If CT is unrevealing and symptoms are persisting we will schedule for repeat EGD with Dr. Silverio Decamp. ?Increase pantoprazole to 40 mg p.o. twice daily AC breakfast and AC lunch ?Have also asked her to take the Zofran on a more scheduled basis to take 1 every morning and then repeat in 6 to 8 hours up to 3 times daily ?Stop naproxen and use Tylenol ?Frequent small bland meals. ? ?Alfredia Ferguson PA-C ?12/01/2021 ? ? ?  Cc: Gildardo Pounds, NP ?  ?

## 2021-12-01 NOTE — Patient Instructions (Signed)
If you are age 39 or younger, your body mass index should be between 19-25. Your Body mass index is 32.61 kg/m?Marland Kitchen If this is out of the aformentioned range listed, please consider follow up with your Primary Care Provider.  ?________________________________________________________ ? ?The [facility] providers would like to encourage you to use [facility] to communicate with providers for non-urgent requests or questions.  Due to long hold times on the telephone, sending your provider a message by [facility] may be a faster and more efficient way to get a response.  Please allow 48 business hours for a response.  Please remember that this is for non-urgent requests.  ?_______________________________________________________ ? ?You have been scheduled for a CT scan of the abdomen and pelvis at [place] ([address]---this is in the same building as [employer]).  ? ?You are scheduled on ______ at _______. You should arrive 15 minutes prior to your appointment time for registration. Please follow the written instructions below on the day of your exam: ? ?WARNING: IF YOU ARE ALLERGIC TO IODINE/X-RAY DYE, PLEASE NOTIFY RADIOLOGY IMMEDIATELY AT [phone]! YOU WILL BE GIVEN A 13 HOUR PREMEDICATION PREP. ? ?1) Do not eat anything after _______ (4 hours prior to your test) ?2) You have been given 2 bottles of oral contrast to drink. The solution may taste better if refrigerated, but do NOT add ice or any other liquid to this solution. Shake well before drinking. ?  ? Drink 1 bottle of contrast @ _______ (2 hours prior to your exam) ? Drink 1 bottle of contrast @ _______ (1 hour prior to your exam) ? ?You may take any medications as prescribed with a small amount of water, if necessary. If you take any of the following medications: METFORMIN, GLUCOPHAGE, GLUCOVANCE, AVANDAMET, RIOMET, FORTAMET, ACTOPLUS MET, JANUMET, [place] or METAGLIP, you MAY be asked to HOLD this medication 48 hours AFTER the  exam. ? ?The purpose of you drinking the oral contrast is to aid in the visualization of your intestinal tract. The contrast solution may cause some diarrhea. Depending on your individual set of symptoms, you may also receive an intravenous injection of x-ray contrast/dye. Plan on being at [facility] for 30 minutes or longer, depending on the type of exam you are having performed. ? ?This test typically takes 30-45 minutes to complete. ? ?If you have any questions regarding your exam or if you need to reschedule, you may call the CT department at [phone] between the hours of 8:00 am and 5:00 pm, Monday-Friday. ? ?________________________________________________________________________ ? ?Increase Pantoprazole 40 mg to twice daily  ?Start taking your Ondansetron 1 tablet every morning then every 6-8 hours  ? ?Stop using Naproxen, use Tylenol instead ? ?Eat frequent small meals.  ? ?Follow up pending the results of your CT. ? ?Thank you for entrusting me with your care and choosing [facility]. ? ?Nicoletta Ba, PA-C ?

## 2021-12-03 ENCOUNTER — Other Ambulatory Visit: Payer: Self-pay

## 2021-12-03 ENCOUNTER — Ambulatory Visit (INDEPENDENT_AMBULATORY_CARE_PROVIDER_SITE_OTHER)
Admission: RE | Admit: 2021-12-03 | Discharge: 2021-12-03 | Disposition: A | Discharge status: Home / Self Care | Payer: Self-pay | Source: Ambulatory Visit | Attending: Physician Assistant | Admitting: Physician Assistant

## 2021-12-03 DIAGNOSIS — R112 Nausea with vomiting, unspecified: Secondary | ICD-10-CM

## 2021-12-03 DIAGNOSIS — R1010 Upper abdominal pain, unspecified: Secondary | ICD-10-CM

## 2021-12-03 MED ORDER — IOHEXOL 300 MG/ML  SOLN
100.0000 mL | Freq: Once | INTRAMUSCULAR | Status: AC | PRN
  Administered 2021-12-03: 100 mL via INTRAVENOUS

## 2021-12-08 NOTE — Progress Notes (Signed)
Reviewed and agree with documentation and assessment and plan. K. Veena Semone Orlov , MD   

## 2022-01-05 ENCOUNTER — Ambulatory Visit: Payer: Self-pay

## 2022-01-05 NOTE — Telephone Encounter (Signed)
Summary: positive pregnancy test  ? Patient has a positive pregnancy test and would like to know if she can take her arthritis and stomach medication while being pregnant. Patient has a follow up appointment onh  01/27/2022 but would like to know prior   ?  ?Called pt using pacific - LMOM ?

## 2022-01-05 NOTE — Telephone Encounter (Signed)
[address] IlaJorge, #[id number] ? ? ?Chief Complaint: medication questions ?Symptoms: pregnant ?Frequency:  ?Pertinent Negatives: NA ?Disposition: [] ED /[] Urgent Care (no appt availability in office) / [] Appointment(In office/virtual)/ []  [place] [facility]/ [x] Home Care/ [] Refused Recommended Disposition /[] [place] [facility]/ []  Follow-up with PCP ?Additional Notes: pt called in to ask if she can continue taking the pantoprazole and Naproxen while pregnant. Pt is unsure how far along she is appox a month. I advised her that neither medication was safe to take while pregnant and advised pt to take Tylenol for pain if needed. She states Tylenol makes her heart race so is there anything else she can take. I advised her if she had to take the Naproxen until she has her appt on 01/27/22 she could but just don't take it regular. Pt verbalized understanding.  ? ? ?Reason for Disposition ? Caller requesting information about medicine during pregnancy; adult is not ill AND triager answers question ? ?Answer Assessment - Initial Assessment Questions ?1. NAME of MEDICATION: "What medicine are you calling about?" ?    Pantoprazole and Naproxen ?2. QUESTION: "What is your question?" (e.g., double dose of medicine, side effect) ?    Safe while pregnant ?3. PRESCRIBING HCP: "Who prescribed it?" Reason: if prescribed by specialist, call should be referred to that group. ?    Shon HaleZelda, NP ? ?Protocols used: Medication Question Call-A-AH ? ?

## 2022-01-26 ENCOUNTER — Inpatient Hospital Stay (HOSPITAL_COMMUNITY)
Admission: AD | Admit: 2022-01-26 | Discharge: 2022-01-26 | Disposition: A | Discharge status: Home / Self Care | Payer: Self-pay | Attending: Obstetrics and Gynecology | Admitting: Obstetrics and Gynecology

## 2022-01-26 ENCOUNTER — Other Ambulatory Visit: Payer: Self-pay

## 2022-01-26 ENCOUNTER — Encounter (HOSPITAL_COMMUNITY): Payer: Self-pay | Admitting: Obstetrics and Gynecology

## 2022-01-26 ENCOUNTER — Inpatient Hospital Stay (HOSPITAL_COMMUNITY): Payer: Self-pay

## 2022-01-26 DIAGNOSIS — O0931 Supervision of pregnancy with insufficient antenatal care, first trimester: Secondary | ICD-10-CM | POA: Insufficient documentation

## 2022-01-26 DIAGNOSIS — Z3A01 Less than 8 weeks gestation of pregnancy: Secondary | ICD-10-CM

## 2022-01-26 DIAGNOSIS — Z3A08 8 weeks gestation of pregnancy: Secondary | ICD-10-CM | POA: Insufficient documentation

## 2022-01-26 DIAGNOSIS — O209 Hemorrhage in early pregnancy, unspecified: Secondary | ICD-10-CM

## 2022-01-26 DIAGNOSIS — O09521 Supervision of elderly multigravida, first trimester: Secondary | ICD-10-CM | POA: Insufficient documentation

## 2022-01-26 DIAGNOSIS — Z3490 Encounter for supervision of normal pregnancy, unspecified, unspecified trimester: Secondary | ICD-10-CM

## 2022-01-26 LAB — ABO/RH: ABO/RH(D): A POS

## 2022-01-26 LAB — CBC
HCT: 36.5 % (ref 36.0–46.0)
Hemoglobin: 11.8 g/dL — ABNORMAL LOW (ref 12.0–15.0)
MCH: 28.1 pg (ref 26.0–34.0)
MCHC: 32.3 g/dL (ref 30.0–36.0)
MCV: 86.9 fL (ref 80.0–100.0)
Platelets: 292 10*3/uL (ref 150–400)
RBC: 4.2 MIL/uL (ref 3.87–5.11)
RDW: 13.9 % (ref 11.5–15.5)
WBC: 6.5 10*3/uL (ref 4.0–10.5)
nRBC: 0 % (ref 0.0–0.2)

## 2022-01-26 LAB — HCG, QUANTITATIVE, PREGNANCY: hCG, Beta Chain, Quant, S: 74823 m[IU]/mL — ABNORMAL HIGH (ref <5)

## 2022-01-26 LAB — POCT PREGNANCY, URINE: Preg Test, Ur: POSITIVE — AB

## 2022-01-26 NOTE — MAU Provider Note (Signed)
Chief Complaint:  Vaginal Bleeding ? ? None  ?  ?HPI: Karina Murphy is a 41 y.o. G3P2002 at [redacted]w[redacted]d who presents to maternity admissions reporting one episode of bright red bleeding with two small clots at 2330. No further bleeding since that incident and no cramping. No other physical complaints, no recent intercourse. Had a positive UPT on 01/03/22.  ? ?Pregnancy Course: Has not started OB care yet. ? ?Past Medical History:  ?Diagnosis Date  ? Anxiety   ? Arthritis, rheumatoid ([place])   ? Chronic headaches   ? GERD (gastroesophageal reflux disease)   ? Hypertension   ? Obesity   ? Urticaria   ? ?OB History  ?Gravida Para Term Preterm AB Living  ?3 2 2     2   ?SAB IAB Ectopic Multiple Live Births  ?        2  ?  ?# Outcome Date GA Lbr Len/2nd Weight Sex Delivery Anes PTL Lv  ?3 Current           ?2 Term      Vag-Spont     ?1 Term      Vag-Spont     ? ?Past Surgical History:  ?Procedure Laterality Date  ? CHOLECYSTECTOMY  07/2020  ? ?Family History  ?Problem Relation Age of Onset  ? Diabetes Mother   ? Hypertension Mother   ? Stomach cancer Neg Hx   ? Colon cancer Neg Hx   ? Esophageal cancer Neg Hx   ? Pancreatic cancer Neg Hx   ? ?Social History  ? ?Tobacco Use  ? Smoking status: Never  ? Smokeless tobacco: Never  ?Vaping Use  ? Vaping Use: Never used  ?Substance Use Topics  ? Alcohol use: No  ? Drug use: No  ? ?Allergies  ?Allergen Reactions  ? Pineapple Itching  ? Morphine Palpitations  ? ?Medications Prior to Admission  ?Medication Sig Dispense Refill Last Dose  ? Naproxen-Liniment (FLANAX PAIN RELIEF CO) Take 1-2 tablets by mouth every 4 (four) hours as needed (pain/headache).   Past Month  ? ondansetron (ZOFRAN) 4 MG tablet Take 1 tablet (4 mg total) by mouth every 6 (six) hours. 12 tablet 0 Past Month  ? pantoprazole (PROTONIX) 40 MG tablet Take 1 tablet (40 mg total) by mouth daily. 30 tablet 3 Past Month  ? sucralfate (CARAFATE) 1 g tablet Take 1 tablet (1 g total) by mouth 4 (four) times daily -  with  meals and at bedtime. 40 tablet 0 Past Month  ? ibuprofen (ADVIL) 200 MG tablet Take 200 mg by mouth every 6 (six) hours as needed for headache or moderate pain.   More than a month  ? ?I have reviewed patient's Past Medical Hx, Surgical Hx, Family Hx, Social Hx, medications and allergies.  ? ?ROS:  ?Pertinent items noted in HPI and remainder of comprehensive ROS otherwise negative.  ? ?Physical Exam  ?Patient Vitals for the past 24 hrs: ? BP Temp Temp src Pulse Resp Height Weight  ?01/26/22 0112 120/63 98.4 ?F (36.9 ?C) Oral 72 20 5\' 2"  (1.575 m) 184 lb 11.2 oz (83.8 kg)  ? ?Physical Exam ?Vitals and nursing note reviewed.  ?Constitutional:   ?   General: She is not in acute distress. ?   Appearance: Normal appearance. She is obese. She is not ill-appearing.  ?HENT:  ?   Head: Normocephalic and atraumatic.  ?Eyes:  ?   Pupils: Pupils are equal, round, and reactive to light.  ?Cardiovascular:  ?  Rate and Rhythm: Normal rate and regular rhythm.  ?Pulmonary:  ?   Effort: Pulmonary effort is normal.  ?Abdominal:  ?   Tenderness: There is no abdominal tenderness.  ?Genitourinary: ?   Comments: Exam deferred, no longer bleeding, sent to U/S ?Musculoskeletal:     ?   General: Normal range of motion.  ?Skin: ?   General: Skin is warm and dry.  ?   Capillary Refill: Capillary refill takes less than 2 seconds.  ?Neurological:  ?   Mental Status: She is alert and oriented to person, place, and time.  ?Psychiatric:     ?   Mood and Affect: Mood normal.     ?   Behavior: Behavior normal.     ?   Thought Content: Thought content normal.     ?   Judgment: Judgment normal.  ?  ?Labs: ?Results for orders placed or performed during the hospital encounter of 01/26/22 (from the past 24 hour(s))  ?Pregnancy, urine POC     Status: Abnormal  ? Collection Time: 01/26/22  1:30 AM  ?Result Value Ref Range  ? Preg Test, Ur POSITIVE (A) NEGATIVE  ?CBC     Status: Abnormal  ? Collection Time: 01/26/22  2:11 AM  ?Result Value Ref Range  ? WBC  6.5 4.0 - 10.5 K/uL  ? RBC 4.20 3.87 - 5.11 MIL/uL  ? Hemoglobin 11.8 (L) 12.0 - 15.0 g/dL  ? HCT 36.5 36.0 - 46.0 %  ? MCV 86.9 80.0 - 100.0 fL  ? MCH 28.1 26.0 - 34.0 pg  ? MCHC 32.3 30.0 - 36.0 g/dL  ? RDW 13.9 11.5 - 15.5 %  ? Platelets 292 150 - 400 K/uL  ? nRBC 0.0 0.0 - 0.2 %  ?ABO/Rh     Status: None  ? Collection Time: 01/26/22  2:11 AM  ?Result Value Ref Range  ? ABO/RH(D) A POS   ? No rh immune globuloin    ?  NOT A RH IMMUNE GLOBULIN CANDIDATE, PT RH POSITIVE ?Performed at [facility], [place] [address], [place], [place] [address] ?  ?hCG, quantitative, pregnancy     Status: Abnormal  ? Collection Time: 01/26/22  2:11 AM  ?Result Value Ref Range  ? hCG, Beta Chain, Quant, S 74,823 (H) <5 mIU/mL  ? ?Imaging:  ?US OB Comp Less 14 Wks ? ?Result Date: 01/26/2022 ?CLINICAL DATA:  Vaginal bleeding and positive pregnancy test EXAM: OBSTETRIC <14 WK ULTRASOUND TECHNIQUE: Transabdominal ultrasound was performed for evaluation of the gestation as well as the maternal uterus and adnexal regions. COMPARISON:  12/03/2021 FINDINGS: Intrauterine gestational sac: Present Yolk sac:  Present Embryo:  Present Cardiac Activity: Present Heart Rate: 171 bpm CRL:   14.7 mm   7 w 5 d                  [place] EDC: 09/09/2022 Subchorionic hemorrhage:  None visualized. Maternal uterus/adnexae: Within normal limits. IMPRESSION: Single live intrauterine gestation at 7 weeks 5 days. Electronically Signed   By: Inez Catalina M.D.   On: 01/26/2022 02:16   ? ?MAU Course: ?Orders Placed This Encounter  ?Procedures  ? US OB Comp Less 14 Wks  ? CBC  ? hCG, quantitative, pregnancy  ? Pregnancy, urine POC  ? ABO/Rh  ? Discharge patient  ? ?No orders of the defined types were placed in this encounter. ? ?MDM: ?Ectopic workup ordered from the lobby due to MAU acuity. ?Results showed IUP with FHR: 171. ?  Results reviewed and picture given to pt with interpreter. Bleeding precautions given. ? ?Assessment: ?1. Vaginal bleeding affecting early  pregnancy   ?2. Intrauterine pregnancy   ?3. [redacted] weeks gestation of pregnancy   ? ?Plan: ?Discharge home in stable condition with bleeding precautions  ?OB List given in AVS.  ? Follow-up Information   ? ? OB Provider. Schedule an appointment as soon as possible for a visit.   ?Why: to establish OB care ? ?  ?  ? ?  ?  ? ?  ?  ?Allergies as of 01/26/2022   ? ?   Reactions  ? Pineapple Itching  ? Morphine Palpitations  ? ?  ? ?  ?Medication List  ?  ? ?TAKE these medications   ? ?FLANAX PAIN RELIEF CO ?Take 1-2 tablets by mouth every 4 (four) hours as needed (pain/headache). ?  ?ibuprofen 200 MG tablet ?Commonly known as: ADVIL ?Take 200 mg by mouth every 6 (six) hours as needed for headache or moderate pain. ?  ?ondansetron 4 MG tablet ?Commonly known as: ZOFRAN ?Take 1 tablet (4 mg total) by mouth every 6 (six) hours. ?  ?pantoprazole 40 MG tablet ?Commonly known as: Protonix ?Tome 1 tableta (40 mg en total) por v?a oral diariamente. ?(Take 1 tablet (40 mg total) by mouth daily.) ?  ?sucralfate 1 g tablet ?Commonly known as: Carafate ?Take 1 tablet (1 g total) by mouth 4 (four) times daily -  with meals and at bedtime. ?  ? ?  ? ?Gaylan Gerold, CNM, MSN, IBCLC ?Certified Nurse Midwife, [place] ? ? ? ? ?

## 2022-01-26 NOTE — MAU Note (Addendum)
Pt says with interpreterVernona Rieger [phone] ?Says at 2330- She went to b-room and wiped and dark clots came out  ?No pad on now  ?No blood in her pants  ?No cramps - has mild pain- left side of lower abd  ?HPT- 01-03-2022- positive   ? ?

## 2022-01-26 NOTE — Discharge Instructions (Signed)
[facility] Providers   [facility]             [address], [place], [place] [address] [phone]  [facility]                                                             [address], [address], [place], [place], [address] [phone]  [facility] [place]                                    [address], [address], [place], [place], [address] [phone]  [facility] [place] [address], [place], [place], [address] [phone]  [facility]                                 [address], [place], [place], [address] [phone]  [facility]                                    [address], [place], [place], [address] [phone]  [facility] [address], [address], [place], [place], [address]                              [facility] [address], [place], , [address] [phone]  [facility]         Phone: [phone]  [facility]      Phone: [phone]   [facility]      Phone: [phone]  [facility]         Phone: [phone]   [facility]    Phone: [phone]  [facility]      Phone: [phone]  [facility]     Phone: [phone]  [facility]        Phone: [phone]  [facility]      Phone: [phone]  

## 2022-01-27 ENCOUNTER — Ambulatory Visit: Payer: Self-pay | Attending: Physician Assistant | Admitting: Physician Assistant

## 2022-01-27 ENCOUNTER — Encounter: Payer: Self-pay | Admitting: Physician Assistant

## 2022-01-27 ENCOUNTER — Other Ambulatory Visit: Payer: Self-pay

## 2022-01-27 VITALS — BP 108/72 | HR 74 | Resp 16 | Ht 64.0 in | Wt 183.0 lb

## 2022-01-27 DIAGNOSIS — R110 Nausea: Secondary | ICD-10-CM

## 2022-01-27 DIAGNOSIS — Z3A08 8 weeks gestation of pregnancy: Secondary | ICD-10-CM

## 2022-01-27 DIAGNOSIS — Z09 Encounter for follow-up examination after completed treatment for conditions other than malignant neoplasm: Secondary | ICD-10-CM

## 2022-01-27 MED ORDER — DOXYLAMINE-PYRIDOXINE ER 20-20 MG PO TBCR
1.0000 | EXTENDED_RELEASE_TABLET | Freq: Two times a day (BID) | ORAL | 1 refills | Status: DC | PRN
  Filled 2022-01-27: qty 60, fill #0

## 2022-01-27 NOTE — Patient Instructions (Signed)
Drink 80-100 ounces water daily ? ?Take a folic acid supplement daily ? ?Embarazo despu?s de los 35 a?os ?Pregnancy After Age 39 ?Las mujeres que quedan embarazadas despu?s de los 35 a?os corren un mayor riesgo de Warehouse manager ciertos problemas en el [place]. Esto se debe a que posiblemente las [employer] ya tengan problemas de salud antes de quedar [place]. Las [employer] que gozan de buena salud antes del Psychiatrist pueden tener problemas durante el embarazo de todos modos. Estos problemas pueden afectar a la madre, al beb? en gestaci?n (feto) o a ambos. ??Karina Murphy? modo me afecta? ?Si tiene m?s de 35 a?os y desea quedar embarazada o est? embarazada, es posible que corra un riesgo mayor de que suceda lo siguiente: ?No poder quedar embarazada (infertilidad). ?Que Karina Murphy de parto comience antes (trabajo de parto prematuro). ?Requerir un parto quir?rgico (parto por ces?rea). ?Tener complicaciones durante el [place], como hipertensi?n arterial y otros s?ntomas (preeclampsia). ?Tener diabetes durante el embarazo (diabetes gestacional). ?Estar embarazada de m?s de un beb?Marland Kitchen ?Perder el beb? en gestaci?n antes de las 20 semanas (aborto espont?neo) o despu?s de las 20 semanas (muerte fetal) de Psychiatrist. ??C?mo afecta esto al beb?? ?Los beb?s cuyas madres tienen m?s de 35 a?os de edad corren un riesgo mayor de que suceda lo siguiente: ?Nacer antes de tiempo (prematuramente). ?Tener bajo peso al nacer, es decir, menos de 5 libras y 8 onzas (2.5 kg). ?Tener defectos cong?nitos, como s?ndrome de Down y Engineer, petroleum. ?Tener complicaciones de [place], como problemas de crecimiento y Sales promotion account executive. ?Cuidado prenatal ?Todas las mujeres deben visitar al m?dico antes de intentar quedar embarazadas. Esto es especialmente importante para las mujeres de m?s de 35 a?os. Informe al m?dico acerca de lo siguiente: ?Cualquier problema de salud que tenga. ?Los medicamentos que [place]. ?Los antecedentes familiares de problemas de salud o  defectos relacionados con cromosomas. ?Cualquier problema que haya tenido con cirug?as, embarazos o partos anteriores. ?Si tiene m?s de 35 a?os y planea quedar [place], empiece a tomar un multivitam?nico todos los d?as un mes o m?s antes de intentar quedar embarazada. El multivitam?nico debe contener 400 mcg (microgramos) de ?cido f?lico. ?Si tiene m?s de 39 a?os y est? embarazada, aseg?rese de: ?Seguir tomando el multivitam?nico a menos que el m?dico le diga que no lo haga. ?Cumpla con todas las visitas de seguimiento. Esto incluye las visitas prenatales. Esto es importante. ?Hable con el m?dico sobre otras pruebas de detecci?n prenatales que podr?a necesitar. ?Pruebas prenatales ?Las pruebas de detecci?n muestran si el beb? corre un riesgo m?s alto de tener defectos cong?nitos que otros beb?s. Las siguientes son algunas de las pruebas de detecci?n: ?Ecograf?as m?s frecuentes para buscar marcadores que indican un riesgo de defectos cong?nitos. ?An?lisis de detecci?n en sangre materna. Son an?lisis de sangre que ayudan a Chief Strategy Officer el riesgo de defectos cong?nitos de su beb?Marland Kitchen ?Las pruebas de detecci?n no indican si el beb? tiene o no defectos cong?nitos. Solo indican el riesgo que existe de que el beb? tenga ciertos defectos cong?nitos. Si las pruebas de detecci?n indican que hay alg?n factor de riesgo presente, quiz?s deba hacerse estudios para confirmar el defecto cong?nito (pruebas de diagn?stico). Estas pruebas pueden incluir lo siguiente: ?Muestras de vellosidades cori?nicas. Para este procedimiento, se toma una muestra de tejido del ?rgano que se forma en el ?tero para nutrir al beb? (placenta). La muestra se extrae a trav?s del cuello uterino o del abdomen y se la Chief of Staff. ?Amniocentesis. Para este procedimiento, se extrae y se analiza una peque?a  cantidad del l?quido que rodea al beb? en el ?tero (l?quido amni?tico). ?Tambi?n necesitar? pruebas de detecci?n de diabetes gestacional en el primer trimestre,  as? como m?s adelante en el embarazo. ?Mantenerse sana durante el embarazo ?Gozar de buena salud durante el embarazo puede ayudar a que exista un riesgo menor de que usted y el beb? tengan problemas durante el [place], el [place] o ambos. Hable con el m?dico para que le d? indicaciones espec?ficas sobre c?mo preservar su salud durante el [place]embarazo. ?Consejos generales ?Nutrici?n ? ?En cada comida, consuma alimentos variados de cada uno de los [address]cinco grupos. Estos grupos incluyen: ?Prote?nas, por ejemplo, carnes magras, carne de ave, pescado con bajo contenido de grasas, frijoles, huevos y frutos secos. ?Verduras, por ejemplo, de hoja, verduras crudas y cocidas, y [place]jugo de verduras. ?Frutas frescas, congeladas o enlatadas, o jugos 100 % de frutas. ?Productos l?cteos, como yogur, queso y News Corporationleche descremados. ?Productos integrales, como arroz, cereales, pastas y pan. ?Siga las indicaciones del m?dico respecto a las restricciones de comidas y bebidas durante el Psychiatristembarazo. ?No consuma huevos crudos, carne cruda ni pescado o mariscos crudos. ?No consuma ning?n pescado que contenga grandes cantidades de mercurio, como el pez espada o la caballa. ?C?mo controlar el aumento de peso ?Preg?ntele al m?dico qu? aumento de peso es saludable durante del Psychiatristembarazo. ?Mantenga un peso saludable. De ser necesario, trabaje con su m?dico para bajar de peso de [place] segura. ?Haga ejercicio regularmente como se lo haya indicado el m?dico. Consulte al m?dico qu? tipos de ejercicios son seguros para usted. ?Siga estas instrucciones en su casa: ?No consuma ning?n producto que contenga nicotina o tabaco. Estos productos incluyen cigarrillos, tabaco para mascar y aparatos de vapeo, como los cigarrillos electr?nicos. Si necesita ayuda para dejar de consumir estos productos, consulte al m?dico. ?Beba suficiente l?quido como para mantener la orina de color amarillo p?lido. ?No beba alcohol, no consuma drogas y no abuse de los medicamentos  recetados. ?Use los medicamentos de venta libre y los recetados solamente como se lo haya indicado el m?dico. ?No se d? ba?os de inmersi?n en agua caliente, ba?os turcos ni saunas. ?Hable con su m?dico sobre el riesgo de exposici?n a condiciones ambientales nocivas. Esto incluye la exposici?n a sustancias qu?micas, radiaci?n, productos de limpieza y heces de [place]. Siga el consejo de su m?dico respecto a c?mo limitar la exposici?n. ?Resumen ?Las mujeres que quedan embarazadas despu?s de los 35 a?os corren un mayor riesgo de Product/process development scientisttener complicaciones en el embarazo. ?Los problemas pueden afectar a la madre, al beb? en gestaci?n (feto) o a ambos. ?Todas las mujeres deben visitar al m?dico antes de intentar quedar embarazadas. Esto es especialmente importante para las mujeres de m?s de 35 a?os. ?Gozar de buena salud durante el embarazo puede ayudar a que exista un Occupational hygienistriesgo menor de que tanto usted como el beb? tengan algunos de los problemas que pueden surgir durante el [place]barazo, el [place] o ambos. ?Esta informaci?n no tiene Theme park managercomo fin reemplazar el consejo del m?dico. Aseg?rese de hacerle al m?dico cualquier pregunta que tenga. ?Document Revised: 03/31/2021 Document Reviewed: 07/01/2020 ?Elsevier Patient Education ? 2023 Elsevier Inc. ? ?

## 2022-01-27 NOTE — Progress Notes (Signed)
Patient ID: Karina Murphy, female   DOB: 08/09/1981, 41 y.o.   MRN: [id number] ? ? ? ? ?Karina Murphy, is a 41 y.o. female ? ?[id number] ? ?[id number] ? ?DOB - August 11, 1981 ? ?Chief Complaint  ?Patient presents with  ? Follow-up  ?    ? ?Subjective:  ? ?Karina Murphy is a 41 y.o. female here today for a follow up visit after being seen at the ED yesterday for light vaginal bleeding with positive pregnancy test.  It was confirmed by [place] that she has an intrauterine pregnancy(7 weeks, 5 days intrauterine pregnancy as of yesterday).  The blood was minimal and has faded to minimal brownish discharge sometimes when she wipes after after urination.  She has mild cramping.  No abdominal or pelvic pain.  No fever.  She is having nausea and some vomiting with the pregnancy.  She needs to be referred to OB/gyn.   ?LMP 11/25/2021 ? ? ?No problems updated. ? ?ALLERGIES: ?Allergies  ?Allergen Reactions  ? Pineapple Itching  ? Morphine Palpitations  ? ? ?PAST MEDICAL HISTORY: ?Past Medical History:  ?Diagnosis Date  ? Anxiety   ? Arthritis, rheumatoid (HCC)   ? Chronic headaches   ? GERD (gastroesophageal reflux disease)   ? Hypertension   ? Obesity   ? Urticaria   ? ? ?MEDICATIONS AT HOME: ?Prior to Admission medications   ?Medication Sig Start Date End Date Taking? Authorizing Provider  ?Doxylamine-Pyridoxine ER 20-20 MG TBCR Take 1 tablet by mouth 2 (two) times daily as needed. For nausea and vomiting 01/27/22  Yes Anders Simmonds, PA-C  ? ? ?ROS: ?Neg HEENT ?Neg resp ?Neg cardiac ?Neg GI ?Neg GU ?Neg MS ?Neg psych ?Neg neuro ? ?Objective:  ? ?Vitals:  ? 01/27/22 1102  ?BP: 108/72  ?Pulse: 74  ?Resp: 16  ?SpO2: 100%  ?Weight: 183 lb (83 kg)  ?Height: 5\' 4"  (1.626 m)  ? ?Exam ?General appearance : Awake, alert, not in any distress. Speech Clear. Not toxic looking ?HEENT: Atraumatic and Normocephalic ?Neck: Supple, no JVD. No cervical lymphadenopathy.  ?Chest: Good air entry bilaterally, CTAB.  No  rales/rhonchi/wheezing ?CVS: S1 S2 regular, no murmurs.  ?Extremities: B/L Lower Ext shows no edema, both legs are warm to touch ?Neurology: Awake alert, and oriented X 3, CN II-XII intact, Non focal ?Skin: No Rash ? ?Data Review ?Lab Results  ?Component Value Date  ? HGBA1C 5.2 02/11/2021  ? HGBA1C 5.6 03/19/2020  ? HGBA1C 5.50 06/12/2015  ? ? ?Assessment & Plan  ? ?1. Nausea ?Not intractable-some vomiting.  7 weeks, 5 days intrauterine pregnancy as of yesterday confirmed on u/s.   ?- Doxylamine-Pyridoxine ER 20-20 MG TBCR; Take 1 tablet by mouth 2 (two) times daily as needed. For nausea and vomiting  Dispense: 60 tablet; Refill: 1 ? ?2. [redacted] weeks gestation of pregnancy ?- Ambulatory referral to Obstetrics / Gynecology ?-recommended folic acid supplement/PNV.  Drink 80-100 ounces water daily ? ?3. Encounter for examination following treatment at hospital ?No further blood.  No abdominal pain.   ? ? ?Return if symptoms worsen or fail to improve. ? ?The patient was given clear instructions to go to ER or return to medical center if symptoms don't improve, worsen or new problems develop. The patient verbalized understanding. The patient was told to call to get lab results if they haven't heard anything in the next week.  ? ? ? ? ?Georgian Co, PA-C ?[facility] ?[place], [place] ?[phone]   ?01/27/2022, 11:45  AM  ?

## 2022-02-04 ENCOUNTER — Telehealth (INDEPENDENT_AMBULATORY_CARE_PROVIDER_SITE_OTHER): Payer: Self-pay

## 2022-02-04 DIAGNOSIS — O0990 Supervision of high risk pregnancy, unspecified, unspecified trimester: Secondary | ICD-10-CM | POA: Insufficient documentation

## 2022-02-04 DIAGNOSIS — Z3A00 Weeks of gestation of pregnancy not specified: Secondary | ICD-10-CM

## 2022-02-04 DIAGNOSIS — O09529 Supervision of elderly multigravida, unspecified trimester: Secondary | ICD-10-CM | POA: Insufficient documentation

## 2022-02-04 NOTE — Patient Instructions (Signed)

## 2022-02-04 NOTE — Progress Notes (Signed)
New OB Intake  I connected with  Karina Murphy on 02/04/22 at 10:15 AM EDT by MyChart Video Visit and verified that I am speaking with the correct person using two identifiers. Nurse is located at [facility] and pt is located at home.  I discussed the limitations, risks, security and privacy concerns of performing an evaluation and management service by telephone and the availability of in person appointments. I also discussed with the patient that there may be a patient responsible charge related to this service. The patient expressed understanding and agreed to proceed.  I explained I am completing New OB Intake today. We discussed her EDD of 09/01/2022 that is based on LMP of 11/25/21. Pt is G3/P2. I reviewed her allergies, medications, Medical/Surgical/OB history, and appropriate screenings. I informed her of [facility] services. Based on history, this is a/an  pregnancy complicated by AMA  .   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   MyChart/Babyscripts MyChart access verified. I explained pt will have some visits in office and some virtually. Babyscripts instructions given and order placed. Patient had difficulties downloading the app.  Patient advised that we will f/u with her at her NEW OB provider visit.    Blood Pressure Cuff  Patient is self-pay; explained patient will be given BP cuff at first prenatal appt. Explained after first prenatal appt pt will check weekly and document in Babyscripts.  Weight scale: Patient does have weight scale. Patient has agreed to use at home weight scale.    Anatomy  US Explained first scheduled [place] will be around 19 weeks. Anatomy US scheduled for July 26th at 0915.    Labs Discussed Avelina Laine genetic screening with patient. Would like both Panorama and Horizon drawn at new OB visit.Also if interested in genetic testing, tell patient she will need AFP 15-21 weeks to complete genetic testing .Routine prenatal labs needed.  Covid Vaccine Patient has not covid vaccine.   Is patient a CenteringPregnancy candidate?  Declined Declined due to Group Setting   Is patient a Mom+Baby Combined Care candidate?  Not a candidate  due to multipara     Is patient interested in [place]?  No Patient is not interested in waterbirth    Informed patient of [facility] website  and placed link in her AVS.   Social Determinants of Health Food Insecurity: Patient denies food insecurity. WIC Referral: Patient is interested in referral to [facility].  Transportation: Patient denies transportation needs. Childcare: Discussed no children allowed at ultrasound appointments. Offered childcare services; patient declines childcare services at this time.    Placed OB Box on problem list and updated  First visit review I reviewed new OB appt with pt. I explained she will have a pelvic exam, ob bloodwork with genetic screening, and PAP smear. Explained pt will be seen by Leticia Penna, DO at first visit; encounter routed to appropriate provider. Explained that patient will be seen by pregnancy navigator following visit with provider. [facility] information placed in AVS.   Ralene Bathe, RN 02/04/2022  10:37 AM

## 2022-03-08 ENCOUNTER — Ambulatory Visit (INDEPENDENT_AMBULATORY_CARE_PROVIDER_SITE_OTHER): Payer: Self-pay | Admitting: Family Medicine

## 2022-03-08 ENCOUNTER — Other Ambulatory Visit: Payer: Self-pay

## 2022-03-08 ENCOUNTER — Encounter: Payer: Self-pay | Admitting: Family Medicine

## 2022-03-08 ENCOUNTER — Other Ambulatory Visit (HOSPITAL_COMMUNITY)
Admission: RE | Admit: 2022-03-08 | Discharge: 2022-03-08 | Disposition: A | Discharge status: Home / Self Care | Payer: Self-pay | Source: Ambulatory Visit | Attending: Family Medicine | Admitting: Family Medicine

## 2022-03-08 VITALS — BP 114/74 | HR 80 | Wt 172.0 lb

## 2022-03-08 DIAGNOSIS — O219 Vomiting of pregnancy, unspecified: Secondary | ICD-10-CM

## 2022-03-08 DIAGNOSIS — Z3A14 14 weeks gestation of pregnancy: Secondary | ICD-10-CM

## 2022-03-08 DIAGNOSIS — O09522 Supervision of elderly multigravida, second trimester: Secondary | ICD-10-CM

## 2022-03-08 DIAGNOSIS — M057A Rheumatoid arthritis with rheumatoid factor of other specified site without organ or systems involvement: Secondary | ICD-10-CM

## 2022-03-08 DIAGNOSIS — O0990 Supervision of high risk pregnancy, unspecified, unspecified trimester: Secondary | ICD-10-CM

## 2022-03-08 DIAGNOSIS — Z789 Other specified health status: Secondary | ICD-10-CM

## 2022-03-08 DIAGNOSIS — Z758 Other problems related to medical facilities and other health care: Secondary | ICD-10-CM

## 2022-03-08 LAB — POCT URINALYSIS DIP (DEVICE)
Glucose, UA: NEGATIVE mg/dL
Hgb urine dipstick: NEGATIVE
Ketones, ur: 80 mg/dL — AB
Nitrite: NEGATIVE
Protein, ur: 30 mg/dL — AB
Specific Gravity, Urine: >=1.03 (ref 1.005–1.030)
Urobilinogen, UA: 1 mg/dL (ref 0.0–1.0)
pH: 5.5 (ref 5.0–8.0)

## 2022-03-08 MED ORDER — ASPIRIN 81 MG PO TBEC
81.0000 mg | DELAYED_RELEASE_TABLET | Freq: Every day | ORAL | 1 refills | Status: DC
  Filled 2022-03-08: qty 90, 90d supply, fill #0

## 2022-03-08 MED ORDER — PREPLUS 27-1 MG PO TABS
1.0000 | ORAL_TABLET | Freq: Every day | ORAL | 3 refills | Status: DC
  Filled 2022-03-08: qty 30, 30d supply, fill #0

## 2022-03-08 MED ORDER — DOXYLAMINE-PYRIDOXINE 10-10 MG PO TBEC
DELAYED_RELEASE_TABLET | ORAL | 1 refills | Status: DC
  Filled 2022-03-08: qty 100, 25d supply, fill #0

## 2022-03-09 ENCOUNTER — Other Ambulatory Visit: Payer: Self-pay

## 2022-03-09 LAB — CBC/D/PLT+RPR+RH+ABO+RUBIGG...
Antibody Screen: NEGATIVE
Basophils Absolute: 0 10*3/uL (ref 0.0–0.2)
Basos: 1 %
EOS (ABSOLUTE): 0.1 10*3/uL (ref 0.0–0.4)
Eos: 2 %
HCV Ab: NONREACTIVE
HIV Screen 4th Generation wRfx: NONREACTIVE
Hematocrit: 34.6 % (ref 34.0–46.6)
Hemoglobin: 11.4 g/dL (ref 11.1–15.9)
Hepatitis B Surface Ag: NEGATIVE
Immature Grans (Abs): 0 10*3/uL (ref 0.0–0.1)
Immature Granulocytes: 0 %
Lymphocytes Absolute: 1.3 10*3/uL (ref 0.7–3.1)
Lymphs: 29 %
MCH: 27.7 pg (ref 26.6–33.0)
MCHC: 32.9 g/dL (ref 31.5–35.7)
MCV: 84 fL (ref 79–97)
Monocytes Absolute: 0.4 10*3/uL (ref 0.1–0.9)
Monocytes: 8 %
Neutrophils Absolute: 2.7 10*3/uL (ref 1.4–7.0)
Neutrophils: 60 %
Platelets: 278 10*3/uL (ref 150–450)
RBC: 4.12 x10E6/uL (ref 3.77–5.28)
RDW: 13.6 % (ref 11.7–15.4)
RPR Ser Ql: NONREACTIVE
Rh Factor: POSITIVE
Rubella Antibodies, IGG: 6.83 index (ref >0.99)
WBC: 4.5 10*3/uL (ref 3.4–10.8)

## 2022-03-09 LAB — HCV INTERPRETATION

## 2022-03-09 LAB — HEMOGLOBIN A1C
Est. average glucose Bld gHb Est-mCnc: 108 mg/dL
Hgb A1c MFr Bld: 5.4 % (ref 4.8–5.6)

## 2022-03-10 LAB — CYTOLOGY - PAP
Chlamydia: NEGATIVE
Comment: NEGATIVE
Comment: NEGATIVE
Comment: NEGATIVE
Comment: NORMAL
Diagnosis: UNDETERMINED — AB
High risk HPV: NEGATIVE
Neisseria Gonorrhea: NEGATIVE
Trichomonas: NEGATIVE

## 2022-03-14 LAB — PANORAMA PRENATAL TEST FULL PANEL:PANORAMA TEST PLUS 5 ADDITIONAL MICRODELETIONS: FETAL FRACTION: 14.2

## 2022-03-17 ENCOUNTER — Encounter: Payer: Self-pay | Admitting: General Practice

## 2022-03-17 ENCOUNTER — Telehealth: Payer: Self-pay | Admitting: General Practice

## 2022-03-17 NOTE — Telephone Encounter (Signed)
Called patient with Eda assisting with spanish interpretation, informed her of all lab results & recommended follow up. Patient verbalized understanding.

## 2022-03-17 NOTE — Telephone Encounter (Signed)
-----   Message from Allayne Stack, DO sent at 03/17/2022  9:10 AM EDT ----- Please let patient know her initial labs look good! Her NIPS is low risk. Her pap smear has some abnormal cells, but HPV negative which is good. She will need a repeat pap in 3 years via ASCCP guidelines.   Thank you!

## 2022-03-21 LAB — HORIZON CUSTOM: REPORT SUMMARY: NEGATIVE

## 2022-03-22 ENCOUNTER — Telehealth: Payer: Self-pay | Admitting: *Deleted

## 2022-03-22 NOTE — Telephone Encounter (Signed)
I called patient with Interpreter Thomasene Mohair and informed her results per Dr. Annia Friendly. Patient voices understanding and denies questions Nancy Fetter

## 2022-03-22 NOTE — Telephone Encounter (Signed)
-----   Message from Allayne Stack, DO sent at 03/21/2022  4:43 PM EDT ----- Horizon genetic screen is negative/low risk.

## 2022-03-31 DIAGNOSIS — Z789 Other specified health status: Secondary | ICD-10-CM | POA: Insufficient documentation

## 2022-03-31 DIAGNOSIS — Z758 Other problems related to medical facilities and other health care: Secondary | ICD-10-CM | POA: Insufficient documentation

## 2022-03-31 NOTE — Progress Notes (Deleted)
   PRENATAL VISIT NOTE  Subjective:  Karina Murphy is a 41 y.o. G3P2002 at 18w***d being seen today for ongoing prenatal care.  She is currently monitored for the following issues for this low-risk pregnancy and has Obese; Rheumatoid arthritis (HCC); Supervision of high risk pregnancy, antepartum; and AMA (advanced maternal age) multigravida 35+ on their problem list.  Patient reports {sx:14538}.   .  .   . Denies leaking of fluid.   The following portions of the patient's history were reviewed and updated as appropriate: allergies, current medications, past family history, past medical history, past social history, past surgical history and problem list.   Objective:  There were no vitals filed for this visit.  Fetal Status:           General:  Alert, oriented and cooperative. Patient is in no acute distress.  Skin: Skin is warm and dry. No rash noted.   Cardiovascular: Normal heart rate noted  Respiratory: Normal respiratory effort, no problems with respiration noted  Abdomen: Soft, gravid, appropriate for gestational age.        Pelvic: Cervical exam deferred        Extremities: Normal range of motion.     Mental Status: Normal mood and affect. Normal behavior. Normal judgment and thought content.   Assessment and Plan:  Pregnancy: G3P2002 at 18w***d 1. Supervision of high risk pregnancy, antepartum Discussed and recommended MSAFP - pt *** Discussed circumcision - ***  2. Multigravida of advanced maternal age in second trimester Detailed anatomy on 7/26 LR NIPS  Preterm labor symptoms and general obstetric precautions including but not limited to vaginal bleeding, contractions, leaking of fluid and fetal movement were reviewed in detail with the patient. Please refer to After Visit Summary for other counseling recommendations.   No follow-ups on file.  Future Appointments  Date Time Provider Department Center  04/05/2022  1:15 PM Milas Hock, MD [facility]   04/07/2022  9:15 AM [facility] NURSE [facility]  04/07/2022  9:30 AM [facility] US2 [facility]  05/03/2022  9:15 AM Briant Sites [facility]    Milas Hock, MD

## 2022-04-05 ENCOUNTER — Encounter: Payer: Self-pay | Admitting: Obstetrics and Gynecology

## 2022-04-05 ENCOUNTER — Encounter: Payer: Self-pay | Admitting: Family Medicine

## 2022-04-05 DIAGNOSIS — O0990 Supervision of high risk pregnancy, unspecified, unspecified trimester: Secondary | ICD-10-CM

## 2022-04-05 DIAGNOSIS — O09522 Supervision of elderly multigravida, second trimester: Secondary | ICD-10-CM

## 2022-04-05 DIAGNOSIS — Z789 Other specified health status: Secondary | ICD-10-CM

## 2022-04-07 ENCOUNTER — Ambulatory Visit: Payer: Self-pay | Attending: Family Medicine

## 2022-04-07 ENCOUNTER — Ambulatory Visit: Payer: Self-pay | Admitting: *Deleted

## 2022-04-07 ENCOUNTER — Encounter: Payer: Self-pay | Admitting: *Deleted

## 2022-04-07 ENCOUNTER — Other Ambulatory Visit: Payer: Self-pay | Admitting: *Deleted

## 2022-04-07 VITALS — BP 115/56 | HR 78

## 2022-04-07 DIAGNOSIS — O0990 Supervision of high risk pregnancy, unspecified, unspecified trimester: Secondary | ICD-10-CM

## 2022-04-07 DIAGNOSIS — O09522 Supervision of elderly multigravida, second trimester: Secondary | ICD-10-CM

## 2022-04-07 DIAGNOSIS — Z6835 Body mass index (BMI) 35.0-35.9, adult: Secondary | ICD-10-CM

## 2022-04-07 DIAGNOSIS — O99891 Other specified diseases and conditions complicating pregnancy: Secondary | ICD-10-CM

## 2022-05-01 NOTE — Progress Notes (Unsigned)
   PRENATAL VISIT NOTE  Subjective:  Karina Murphy is a 41 y.o. G3P2002 at [redacted]w[redacted]d being seen today for ongoing prenatal care.  She is currently monitored for the following issues for this high-risk pregnancy and has Obese; Rheumatoid arthritis (HCC); Supervision of high risk pregnancy, antepartum; AMA (advanced maternal age) multigravida 35+; and Language barrier on their problem list.  Patient reports {sx:14538}.   .  .   . Denies leaking of fluid.   The following portions of the patient's history were reviewed and updated as appropriate: allergies, current medications, past family history, past medical history, past social history, past surgical history and problem list.   Objective:  There were no vitals filed for this visit.  Fetal Status:           General:  Alert, oriented and cooperative. Patient is in no acute distress.  Skin: Skin is warm and dry. No rash noted.   Cardiovascular: Normal heart rate noted  Respiratory: Normal respiratory effort, no problems with respiration noted  Abdomen: Soft, gravid, appropriate for gestational age.        Pelvic: {Blank single:19197::"Cervical exam performed in the presence of a chaperone","Cervical exam deferred"}        Extremities: Normal range of motion.     Mental Status: Normal mood and affect. Normal behavior. Normal judgment and thought content.   Assessment and Plan:  Pregnancy: G3P2002 at [redacted]w[redacted]d 1. Supervision of high risk pregnancy, antepartum ***  {Blank single:19197::"Term","Preterm"} labor symptoms and general obstetric precautions including but not limited to vaginal bleeding, contractions, leaking of fluid and fetal movement were reviewed in detail with the patient. Please refer to After Visit Summary for other counseling recommendations.   No follow-ups on file.  Future Appointments  Date Time Provider Department Center  05/05/2022  2:45 PM [facility] NURSE [facility]  05/05/2022  3:00 PM [facility] US1 [facility] [place] [facility]   05/06/2022  1:55 PM Marylene Land, CNM [facility]    Marylene Land, [place]

## 2022-05-03 ENCOUNTER — Encounter: Payer: Self-pay | Admitting: Advanced Practice Midwife

## 2022-05-05 ENCOUNTER — Other Ambulatory Visit: Payer: Self-pay | Admitting: *Deleted

## 2022-05-05 ENCOUNTER — Ambulatory Visit: Payer: Self-pay | Admitting: *Deleted

## 2022-05-05 ENCOUNTER — Ambulatory Visit: Payer: Self-pay | Attending: Obstetrics

## 2022-05-05 VITALS — BP 104/56 | HR 74

## 2022-05-05 DIAGNOSIS — O09522 Supervision of elderly multigravida, second trimester: Secondary | ICD-10-CM

## 2022-05-05 DIAGNOSIS — M069 Rheumatoid arthritis, unspecified: Secondary | ICD-10-CM | POA: Insufficient documentation

## 2022-05-05 DIAGNOSIS — O99212 Obesity complicating pregnancy, second trimester: Secondary | ICD-10-CM

## 2022-05-05 DIAGNOSIS — O0990 Supervision of high risk pregnancy, unspecified, unspecified trimester: Secondary | ICD-10-CM | POA: Insufficient documentation

## 2022-05-05 DIAGNOSIS — O99891 Other specified diseases and conditions complicating pregnancy: Secondary | ICD-10-CM | POA: Insufficient documentation

## 2022-05-05 DIAGNOSIS — Z6835 Body mass index (BMI) 35.0-35.9, adult: Secondary | ICD-10-CM | POA: Insufficient documentation

## 2022-05-05 DIAGNOSIS — Z3A21 21 weeks gestation of pregnancy: Secondary | ICD-10-CM

## 2022-05-05 DIAGNOSIS — O4442 Low lying placenta NOS or without hemorrhage, second trimester: Secondary | ICD-10-CM

## 2022-05-05 DIAGNOSIS — E669 Obesity, unspecified: Secondary | ICD-10-CM

## 2022-05-06 ENCOUNTER — Other Ambulatory Visit: Payer: Self-pay

## 2022-05-06 ENCOUNTER — Ambulatory Visit (INDEPENDENT_AMBULATORY_CARE_PROVIDER_SITE_OTHER): Payer: Self-pay | Admitting: Student

## 2022-05-06 VITALS — BP 103/57 | HR 80 | Wt 175.8 lb

## 2022-05-06 DIAGNOSIS — M05749 Rheumatoid arthritis with rheumatoid factor of unspecified hand without organ or systems involvement: Secondary | ICD-10-CM

## 2022-05-06 DIAGNOSIS — Z3A14 14 weeks gestation of pregnancy: Secondary | ICD-10-CM

## 2022-05-06 DIAGNOSIS — O0990 Supervision of high risk pregnancy, unspecified, unspecified trimester: Secondary | ICD-10-CM

## 2022-05-06 DIAGNOSIS — Z3A22 22 weeks gestation of pregnancy: Secondary | ICD-10-CM

## 2022-05-06 MED ORDER — ASPIRIN 81 MG PO TBEC
81.0000 mg | DELAYED_RELEASE_TABLET | Freq: Every day | ORAL | 1 refills | Status: DC
  Filled 2022-05-06: qty 90, 90d supply, fill #0

## 2022-05-06 NOTE — Progress Notes (Signed)
Ambulatory referral for Rheumatology (internal) was per provider for Rheumatoid arthritis involving hand with positive rheumatoid factor.  Dawayne Patricia, CMA  05/06/22

## 2022-05-06 NOTE — Addendum Note (Signed)
Addended by: Guy Begin on: 05/06/2022 02:51 PM   Modules accepted: Orders

## 2022-05-31 ENCOUNTER — Ambulatory Visit (INDEPENDENT_AMBULATORY_CARE_PROVIDER_SITE_OTHER): Payer: Self-pay | Admitting: Obstetrics and Gynecology

## 2022-05-31 ENCOUNTER — Other Ambulatory Visit: Payer: Self-pay

## 2022-05-31 VITALS — BP 119/70 | HR 76 | Wt 175.1 lb

## 2022-05-31 DIAGNOSIS — Z789 Other specified health status: Secondary | ICD-10-CM

## 2022-05-31 DIAGNOSIS — M069 Rheumatoid arthritis, unspecified: Secondary | ICD-10-CM

## 2022-05-31 DIAGNOSIS — O09522 Supervision of elderly multigravida, second trimester: Secondary | ICD-10-CM

## 2022-05-31 DIAGNOSIS — Z3A25 25 weeks gestation of pregnancy: Secondary | ICD-10-CM

## 2022-05-31 DIAGNOSIS — O0990 Supervision of high risk pregnancy, unspecified, unspecified trimester: Secondary | ICD-10-CM

## 2022-05-31 LAB — POCT URINALYSIS DIP (DEVICE)
Bilirubin Urine: NEGATIVE
Glucose, UA: NEGATIVE mg/dL
Hgb urine dipstick: NEGATIVE
Ketones, ur: NEGATIVE mg/dL
Leukocytes,Ua: NEGATIVE
Nitrite: NEGATIVE
Protein, ur: NEGATIVE mg/dL
Specific Gravity, Urine: 1.02 (ref 1.005–1.030)
Urobilinogen, UA: 1 mg/dL (ref 0.0–1.0)
pH: 7.5 (ref 5.0–8.0)

## 2022-05-31 NOTE — Progress Notes (Signed)
   PRENATAL VISIT NOTE  Subjective:  Karina Murphy is a 41 y.o. G3P2002 at [redacted]w[redacted]d being seen today for ongoing prenatal care.  She is currently monitored for the following issues for this high-risk pregnancy and has Obese; Rheumatoid arthritis ([place]); Supervision of high risk pregnancy, antepartum; AMA (advanced maternal age) multigravida 35+; and Language barrier on their problem list.  Patient doing well with no acute concerns today. She reports  increasing joint pain c/w with her rheumatoid arthritis .  Contractions: Not present. Vag. Bleeding: None.  Movement: Present. Denies leaking of fluid.   The following portions of the patient's history were reviewed and updated as appropriate: allergies, current medications, past family history, past medical history, past social history, past surgical history and problem list. Problem list updated.  Objective:   Vitals:   05/31/22 1445  BP: 119/70  Pulse: 76  Weight: 175 lb 1.6 oz (79.4 kg)    Fetal Status: Fetal Heart Rate (bpm): 151 Fundal Height: 25 cm Movement: Present     General:  Alert, oriented and cooperative. Patient is in no acute distress.  Skin: Skin is warm and dry. No rash noted.   Cardiovascular: Normal heart rate noted  Respiratory: Normal respiratory effort, no problems with respiration noted  Abdomen: Soft, gravid, appropriate for gestational age.  Pain/Pressure: Present     Pelvic: Cervical exam deferred        Extremities: Normal range of motion.  Edema: Trace  Mental Status:  Normal mood and affect. Normal behavior. Normal judgment and thought content.   Assessment and Plan:  Pregnancy: G3P2002 at [redacted]w[redacted]d  1. [redacted] weeks gestation of pregnancy   2. Supervision of high risk pregnancy, antepartum Continue routine prenatal care  3. Multigravida of advanced maternal age in second trimester Growth scan this week, 06/03/22  4. Language barrier Tele interpreter utilized  5. Rheumatoid arthritis of other site,  unspecified whether rheumatoid factor present ([place]) Pt is noting increasing discomfort in her joint.  She does have appointment with rheumatologist, but it is not until November.  Pt advised to call and advise them she is pregnant and was hoping to be seen sooner than November for care.  Preterm labor symptoms and general obstetric precautions including but not limited to vaginal bleeding, contractions, leaking of fluid and fetal movement were reviewed in detail with the patient.  Please refer to After Visit Summary for other counseling recommendations.   Return in about 2 weeks (around 06/14/2022) for [facility], in person, 2 hr GTT, 3rd trim labs.   Lynnda Shields, MD Faculty Attending Center for [facility]

## 2022-06-03 ENCOUNTER — Other Ambulatory Visit: Payer: Self-pay | Admitting: *Deleted

## 2022-06-03 ENCOUNTER — Ambulatory Visit: Payer: Self-pay | Admitting: *Deleted

## 2022-06-03 ENCOUNTER — Ambulatory Visit: Payer: Self-pay | Attending: Maternal & Fetal Medicine

## 2022-06-03 VITALS — BP 119/60 | HR 69

## 2022-06-03 DIAGNOSIS — O09522 Supervision of elderly multigravida, second trimester: Secondary | ICD-10-CM | POA: Insufficient documentation

## 2022-06-03 DIAGNOSIS — O99891 Other specified diseases and conditions complicating pregnancy: Secondary | ICD-10-CM

## 2022-06-03 DIAGNOSIS — E669 Obesity, unspecified: Secondary | ICD-10-CM

## 2022-06-03 DIAGNOSIS — O0990 Supervision of high risk pregnancy, unspecified, unspecified trimester: Secondary | ICD-10-CM

## 2022-06-03 DIAGNOSIS — Z3A26 26 weeks gestation of pregnancy: Secondary | ICD-10-CM

## 2022-06-03 DIAGNOSIS — O99212 Obesity complicating pregnancy, second trimester: Secondary | ICD-10-CM | POA: Insufficient documentation

## 2022-06-03 DIAGNOSIS — M069 Rheumatoid arthritis, unspecified: Secondary | ICD-10-CM

## 2022-06-14 ENCOUNTER — Other Ambulatory Visit: Payer: Self-pay

## 2022-06-14 ENCOUNTER — Encounter: Payer: Self-pay | Admitting: Obstetrics and Gynecology

## 2022-06-14 ENCOUNTER — Ambulatory Visit (INDEPENDENT_AMBULATORY_CARE_PROVIDER_SITE_OTHER): Payer: Self-pay | Admitting: Obstetrics and Gynecology

## 2022-06-14 VITALS — BP 111/74 | HR 80 | Wt 175.0 lb

## 2022-06-14 DIAGNOSIS — O0990 Supervision of high risk pregnancy, unspecified, unspecified trimester: Secondary | ICD-10-CM

## 2022-06-14 DIAGNOSIS — Z789 Other specified health status: Secondary | ICD-10-CM

## 2022-06-14 DIAGNOSIS — Z3A27 27 weeks gestation of pregnancy: Secondary | ICD-10-CM

## 2022-06-14 DIAGNOSIS — O219 Vomiting of pregnancy, unspecified: Secondary | ICD-10-CM | POA: Insufficient documentation

## 2022-06-14 DIAGNOSIS — O09522 Supervision of elderly multigravida, second trimester: Secondary | ICD-10-CM

## 2022-06-14 DIAGNOSIS — Z23 Encounter for immunization: Secondary | ICD-10-CM

## 2022-06-14 DIAGNOSIS — M069 Rheumatoid arthritis, unspecified: Secondary | ICD-10-CM

## 2022-06-14 DIAGNOSIS — O0992 Supervision of high risk pregnancy, unspecified, second trimester: Secondary | ICD-10-CM

## 2022-06-14 MED ORDER — PROMETHAZINE HCL 25 MG PO TABS
25.0000 mg | ORAL_TABLET | Freq: Four times a day (QID) | ORAL | 2 refills | Status: DC | PRN
  Filled 2022-06-14: qty 30, 8d supply, fill #0

## 2022-06-14 NOTE — Progress Notes (Signed)
Subjective:  Karina Murphy is a 41 y.o. G3P2002 at [redacted]w[redacted]d being seen today for ongoing prenatal care.  She is currently monitored for the following issues for this high-risk pregnancy and has Obese; Rheumatoid arthritis ([place]); Supervision of high risk pregnancy, antepartum; AMA (advanced maternal age) multigravida 21+; Language barrier; and Nausea and vomiting during pregnancy on their problem list.  Patient reports nausea.  Contractions: Not present. Vag. Bleeding: None.  Movement: Present. Denies leaking of fluid.   The following portions of the patient's history were reviewed and updated as appropriate: allergies, current medications, past family history, past medical history, past social history, past surgical history and problem list. Problem list updated.  Objective:   Vitals:   06/14/22 0907  BP: 111/74  Pulse: 80  Weight: 175 lb (79.4 kg)    Fetal Status: Fetal Heart Rate (bpm): 150   Movement: Present     General:  Alert, oriented and cooperative. Patient is in no acute distress.  Skin: Skin is warm and dry. No rash noted.   Cardiovascular: Normal heart rate noted  Respiratory: Normal respiratory effort, no problems with respiration noted  Abdomen: Soft, gravid, appropriate for gestational age. Pain/Pressure: Present     Pelvic:  Cervical exam deferred        Extremities: Normal range of motion.  Edema: None  Mental Status: Normal mood and affect. Normal behavior. Normal judgment and thought content.   Urinalysis:      Assessment and Plan:  Pregnancy: G3P2002 at [redacted]w[redacted]d  1. Supervision of high risk pregnancy, antepartum Glucola and 28 week labs today - Flu Vaccine QUAD 8mo+IM (Fluarix, Fluzone & Alfiuria Quad PF) - Tdap vaccine greater than or equal to 7yo IM  2. Multigravida of advanced maternal age in second trimester Stable F/U growth scan scheduled  3. Rheumatoid arthritis of other site, unspecified whether rheumatoid factor present [facility]) Has been reffered  to Rheumatology No meds at present  4. Language barrier Live interrupter used during today's visit  5. Nausea and vomiting during pregnancy Diet modification reviewed with Wt, stable Fetal growth normal - promethazine (PHENERGAN) 25 MG tablet; Take 1 tablet (25 mg total) by mouth every 6 (six) hours as needed for nausea or vomiting.  Dispense: 30 tablet; Refill: 2  Preterm labor symptoms and general obstetric precautions including but not limited to vaginal bleeding, contractions, leaking of fluid and fetal movement were reviewed in detail with the patient. Please refer to After Visit Summary for other counseling recommendations.  Return in about 2 weeks (around 06/28/2022) for OB visit, face to face, any provider.   Chancy Milroy, MD

## 2022-06-14 NOTE — Patient Instructions (Signed)
Gripe en los adultos Influenza, Adult La gripe, tambin llamada "influenza", es una infeccin viral que afecta principalmente las vas respiratorias. Esto incluye los pulmones, la nariz y Patent examiner. Se transmite fcilmente de persona a persona (es contagiosa). Provoca sntomas del resfro comn, junto con fiebre alta y Hydrologist. Cules son las causas? La causa de esta afeccin es el virus de la influenza. Puede contraer el virus de las siguientes maneras: Al inhalar las gotitas que estn en el aire liberadas por la tos o el estornudo de una persona infectada. Tocar algo que tiene el virus (se ha contaminado) y luego tocarse la boca, la nariz o los ojos. Qu incrementa el riesgo? Los siguientes factores pueden hacer que sea propenso a Museum/gallery curator la gripe: No lavarse o desinfectarse las manos con frecuencia. Tener contacto cercano con [employer] durante la temporada de resfro y gripe. Tocarse la boca, los ojos o la nariz sin antes lavarse ni desinfectarse las manos. No recibir la vacuna anual contra la gripe. Puede correr un mayor riesgo de [place] gripe, incluso problemas graves, como una infeccin pulmonar (neumona), si: Es mayor de 57 aos de edad. Est embarazada. Tiene debilitado el sistema que combate las enfermedades (sistema inmunitario). Esto incluye a personas que tienen VIH o sndrome de inmunodeficiencia adquirida (SIDA), estn recibiendo quimioterapia o estn tomando medicamentos que reducen (suprimen) el sistema inmunitario. Tiene una enfermedad a largo plazo (crnica), como una enfermedad cardaca, enfermedad renal, diabetes o enfermedad pulmonar. Tiene un trastorno heptico. Tiene mucho sobrepeso (obesidad [place]). Tiene anemia. Tiene asma. Cules son los signos o sntomas? Los sntomas de esta afeccin por lo general comienzan de repente y Sonda Primes 4 y [address]. Estos pueden incluir: Cristy Hilts y escalofros. Dolores de [place], dolores en el cuerpo o dolores  musculares. Dolor de Investment banker, operational. Tos. Secrecin o congestin nasal. Immunologist. Falta de apetito. Debilidad o fatiga. Mareos. Nuseas o vmitos. Cmo se diagnostica? Esta afeccin se puede diagnosticar en funcin de lo siguiente: Los sntomas y los antecedentes mdicos. Un examen fsico. Un hisopado de [place] o garganta y el anlisis del lquido extrado para Hydrographic surveyor el virus de la gripe. Cmo se trata? Si la gripe se diagnostica pronto, se le puede tratar con un medicamento antiviral que se administra por va oral (por la boca) o por va intravenosa (i.v.). Esto puede ayudar a reducir la gravedad de la enfermedad y cunto dura. Cuidarse en su hogar tambin puede ayudar a [employer]. El mdico puede recomendarle lo siguiente: Usar medicamentos de Radio broadcast assistant. Beber mucho lquido. En muchos casos, la gripe desaparece sola. Si tiene sntomas graves o complicaciones, puede tratarse en un hospital. Siga estas instrucciones en su casa: Actividad Descanse segn sea necesario y [employer]. Foy [place] en su casa y no concurra al [place] o a la escuela como se lo haya indicado su mdico. A menos que visite al [employer], evite salir de su casa hasta que la fiebre haya desaparecido por 24 horas sin tomar medicamentos. Comida y bebida [place] una solucin de rehidratacin oral (SRO). Esta es una bebida que se vende en farmacias y tiendas minoristas. Beba suficiente lquido como para Theatre manager la orina de color amarillo plido. En la medida en que pueda, beba lquidos transparentes en pequeas cantidades. Los lquidos transparentes incluyen agua, cubitos de hielo, [place] de fruta rebajado con agua y bebidas deportivas bajas en caloras. En la medida en que pueda, consuma alimentos blandos y fciles de digerir en pequeas cantidades. Estos alimentos incluyen bananas, compota  de [place], arroz, carnes [place], tostadas y Soil scientist. Evite consumir lquidos que contengan mucha azcar o  cafena, como bebidas energticas, bebidas deportivas comunes y refrescos. Evite tomar alcohol. Evite los alimentos condimentados o con alto contenido de [place]. Indicaciones generales     Use los medicamentos de venta libre y los recetados solamente como se lo haya indicado el mdico. Use un humidificador de aire fro para agregar humedad al aire de su casa. Esto puede facilitar la respiracin. Cuando utilice un humidificador de vapor fro, lmpielo a diario. Vace el agua y [place] por agua limpia. Al toser o estornudar, cbrase la boca y la [place]. Lvese las manos frecuentemente con agua y [place] y durante al menos 47 segundos, en especial despus de toser o Brewing technologist. Use desinfectante para manos con alcohol si no dispone de [place] y [place]. Cumpla con todas las visitas de seguimiento. Esto es importante. Cmo se previene?  Colquese la vacuna anual contra la gripe. Esta suele estar disponible a finales del verano, en el otoo o en el invierno. Pregntele al mdico cundo debe colocarse la vacuna contra la gripe. Evite el contacto con personas que estn enfermas durante la temporada de resfro y gripe. Generalmente es durante el otoo y el invierno. Comunquese con un mdico si: Tiene nuevos sntomas. Tiene los siguientes sntomas: Dolor de [place]. Diarrea. Cristy Hilts. La tos empeora. Produce ms mucosidad. Siente nuseas o vomita. Solicite ayuda de inmediato si: [place] para respirar. La piel o las uas se ponen de un color azulado. Presenta dolor intenso o rigidez en el cuello. Siente un dolor repentino en la cabeza, la cara o el odo. No puede comer ni beber sin vomitar. Estos sntomas pueden representar un problema grave que constituye Engineer, maintenance (IT). No espere a ver si los sntomas desaparecen. Solicite atencin mdica de inmediato. Comunquese con el servicio de emergencias de su localidad (911 en los [place]). No conduzca por sus propios medios  [employer]. Resumen La gripe, tambin llamada "influenza", es una infeccin viral que afecta principalmente las vas respiratorias. Los sntomas de la gripe normalmente comienzan de repente y Sonda Primes 4 y 70 das. Colocarse la vacuna anual contra la gripe es la mejor manera de prevenir el contagio de la gripe. Foy [place] en su casa y no concurra al [place] o a la escuela como se lo haya indicado su mdico. A menos que visite al [employer], evite salir de su casa hasta que la fiebre haya desaparecido por 24 horas sin tomar medicamentos. Cumpla con todas las visitas de seguimiento. Esto es importante. Esta informacin no tiene Marine scientist el consejo del mdico. Asegrese de hacerle al mdico cualquier pregunta que tenga. Document Revised: 06/25/2020 Document Reviewed: 05/20/2020 Elsevier Patient Education  [place] Tdap (ttanos, difteria y tos [place]): lo que debe saber Tdap (Tetanus, Diphtheria, Pertussis) Vaccine: What You Need to Know  1. Por qu vacunarse? La vacuna Tdap puede prevenir el ttanos, la difteria y la tos [place]. La difteria y la tos Dyann Ruddle se [place] de persona a Nurse, learning disability. El ttanos ingresa al organismo a travs de cortes o heridas. El [place] (T) provoca rigidez dolorosa en los msculos. El ttanos puede causar graves problemas de [place], como no poder abrir la boca, Best boy dificultad para tragar y Ambulance person, o la muerte. La DIFTERIA (D) puede causar dificultad para respirar, insuficiencia cardaca, parlisis o muerte. La TOS FERINA (aP), tambin conocida como "tos convulsa", puede causar tos violenta e incontrolable lo que  hace difcil respirar, comer o beber. La tos ferina puede ser muy grave, especialmente en los bebs y en los nios pequeos, y causar neumona, convulsiones, dao cerebral o la [place]. En adolescentes y adultos, puede causar prdida de [place], prdida del control de la vejiga, [place] y fracturas de Theatre stage manager al toser de Geographical information systems officer  intensa. 2. Edward [place] Tdap La vacuna Tdap es solo para nios de 7 aos en adelante, adolescentes y [place].  Los adolescentes deben recibir una dosis nica de la vacuna Tdap, preferentemente a los 11 o 12 aos. Las personas embarazadas deben recibir una dosis de la vacuna Tdap durante cada embarazo, preferiblemente durante la primera parte del tercer trimestre, para ayudar a proteger al recin nacido de la tos [place]. Los bebs tienen mayor riesgo de sufrir complicaciones graves y potencialmente mortales debido a la tos [place]. Los adultos que nunca recibieron la vacuna Tdap deben recibir una dosis. [place]ems, los adultos deben recibir una dosis de refuerzo de Tdap o Td (una vacuna diferente que protege contra el ttanos y la difteria, pero no contra la tos [place]) cada 10 aos, o despus de 5 aos en caso de herida o [place] grave o sucia. La vacuna Tdap puede ser administrada al mismo tiempo que otras vacunas. 3. Hable con el mdico Comunquese con la persona que le coloca las vacunas si la persona que la recibe: Ha tenido una reaccin alrgica despus de Ardelia Mems dosis anterior de cualquier vacuna contra el ttanos, la difteria o la tos Secretary, o cualquier Buyer, retail grave, potencialmente mortal Ha tenido un coma, disminucin del nivel de la conciencia o convulsiones prolongadas dentro de los 7 das posteriores a una dosis anterior de cualquier vacuna contra la tos Arts development officer (DTP, DTaP o Tdap) Tiene convulsiones u otro problema del sistema nervioso Alguna vez tuvo sndrome de Curator (tambin llamado "SGB") Ha tenido dolor intenso o hinchazn despus de una dosis anterior de cualquier vacuna contra el ttanos o la difteria En algunos casos, es posible que el mdico decida posponer la aplicacin de la vacuna Tdap para una visita en el futuro. Las personas que sufren trastornos menores, como un resfro, pueden vacunarse. Las personas que tienen enfermedades moderadas o graves generalmente deben esperar  hasta recuperarse para poder recibir la vacuna Tdap.  Su mdico puede darle ms informacin. 4. Riesgos de [place] reaccin a la vacuna Despus de recibir la vacuna Tdap a veces se puede Patent attorney, enrojecimiento o Estate agent donde se aplic la inyeccin, fiebre leve, dolor de cabeza, sensacin de cansancio y nuseas, vmitos, diarrea o dolor de [place]. Las personas a veces se desmayan despus de procedimientos mdicos, incluida la vacunacin. Informe al mdico si se siente mareado, tiene cambios en la visin o zumbidos en los odos.  Al igual que con cualquier [employer], existe una probabilidad muy remota de que una vacuna cause una reaccin alrgica grave, otra lesin grave o la muerte. 5. Qu pasa si se presenta un problema grave? Podra producirse una reaccin alrgica despus de que la persona vacunada abandone la clnica. Si observa signos de Nurse, mental health grave (ronchas, hinchazn de la cara y la garganta, dificultad para respirar, latidos cardacos acelerados, mareos o debilidad), llame al 9-1-1 y lleve a la persona al hospital ms cercano. Si se presentan otros signos que le preocupan, comunquese con su mdico.  Las reacciones adversas deben informarse al Sistema de Informe de Eventos Adversos de Clinical biochemist (VAERS). Por lo general, el mdico presenta este informe o puede hacerlo usted mismo. Visite  el sitio web del VAERS en www.vaers.SamedayNews.es o llame al (717)433-5064. El VAERS es solo para Electrical engineer, y los miembros de su personal no proporcionan asesoramiento mdico. 6. [place] de Compensacin de Daos por [place] de Compensacin de Daos por Clinical biochemist (National Vaccine Injury Fiserv, Runner, broadcasting/film/video) es un programa federal que fue creado para Patent examiner a las personas que puedan haber sufrido daos al recibir ciertas vacunas. Las Artist a presuntas lesiones o muerte debidas a la vacunacin tienen un lmite de tiempo  para su presentacin, que puede ser de tan solo [employer]. Visite el sitio web del VICP en GoldCloset.com.ee o llame al 1-424-574-4663 para obtener ms informacin acerca del programa y de cmo presentar un reclamo. 7. Cmo puedo obtener ms informacin? Pregntele a su mdico. Comunquese con el servicio de salud de su localidad o su estado. Visite el sitio web de Environmental manager) (Administracin de Alimentos y Chief Strategy Officer) para ver los prospectos de las vacunas e informacin adicional en TraderRating.uy. Comunquese con Garment/textile technologist for [employer]) (Centros para el Control y la Prevencin de [place]): Llame al [phone] (1-800-CDC-INFO) o Visite el sitio web de [employer] en http://hunter.com/. Fuente: Warden/ranger de informacin de los CDC sobre la vacuna Tdap (ttanos, difteria y Elmer Picker) (04/18/2020) Este mismo material est disponible en http://www.wolf.info/ sin cargo. Esta informacin no tiene Marine scientist el consejo del mdico. Asegrese de hacerle al mdico cualquier pregunta que tenga. Document Revised: 08/18/2021 Document Reviewed: 06/01/2021 Elsevier Patient Education  [place].

## 2022-06-15 LAB — CBC
Hematocrit: 31.2 % — ABNORMAL LOW (ref 34.0–46.6)
Hemoglobin: 10.2 g/dL — ABNORMAL LOW (ref 11.1–15.9)
MCH: 28.5 pg (ref 26.6–33.0)
MCHC: 32.7 g/dL (ref 31.5–35.7)
MCV: 87 fL (ref 79–97)
Platelets: 339 10*3/uL (ref 150–450)
RBC: 3.58 x10E6/uL — ABNORMAL LOW (ref 3.77–5.28)
RDW: 13.7 % (ref 11.7–15.4)
WBC: 5.5 10*3/uL (ref 3.4–10.8)

## 2022-06-15 LAB — GLUCOSE TOLERANCE, 2 HOURS W/ 1HR
Glucose, 1 hour: 110 mg/dL (ref 70–179)
Glucose, 2 hour: 104 mg/dL (ref 70–152)
Glucose, Fasting: 71 mg/dL (ref 70–91)

## 2022-06-15 LAB — HIV ANTIBODY (ROUTINE TESTING W REFLEX): HIV Screen 4th Generation wRfx: NONREACTIVE

## 2022-06-15 LAB — RPR: RPR Ser Ql: NONREACTIVE

## 2022-06-21 ENCOUNTER — Other Ambulatory Visit: Payer: Self-pay

## 2022-07-01 ENCOUNTER — Ambulatory Visit: Payer: Self-pay | Attending: Maternal & Fetal Medicine

## 2022-07-01 ENCOUNTER — Ambulatory Visit: Payer: Self-pay | Admitting: *Deleted

## 2022-07-01 ENCOUNTER — Ambulatory Visit (INDEPENDENT_AMBULATORY_CARE_PROVIDER_SITE_OTHER): Payer: Self-pay | Admitting: Obstetrics & Gynecology

## 2022-07-01 ENCOUNTER — Encounter: Payer: Self-pay | Admitting: Obstetrics & Gynecology

## 2022-07-01 ENCOUNTER — Other Ambulatory Visit: Payer: Self-pay

## 2022-07-01 VITALS — BP 106/53 | HR 88 | Wt 173.0 lb

## 2022-07-01 VITALS — BP 113/61 | HR 101

## 2022-07-01 DIAGNOSIS — O09523 Supervision of elderly multigravida, third trimester: Secondary | ICD-10-CM

## 2022-07-01 DIAGNOSIS — M069 Rheumatoid arthritis, unspecified: Secondary | ICD-10-CM

## 2022-07-01 DIAGNOSIS — O99213 Obesity complicating pregnancy, third trimester: Secondary | ICD-10-CM

## 2022-07-01 DIAGNOSIS — O09522 Supervision of elderly multigravida, second trimester: Secondary | ICD-10-CM | POA: Insufficient documentation

## 2022-07-01 DIAGNOSIS — O0990 Supervision of high risk pregnancy, unspecified, unspecified trimester: Secondary | ICD-10-CM

## 2022-07-01 DIAGNOSIS — O99891 Other specified diseases and conditions complicating pregnancy: Secondary | ICD-10-CM

## 2022-07-01 DIAGNOSIS — Z3A30 30 weeks gestation of pregnancy: Secondary | ICD-10-CM

## 2022-07-01 DIAGNOSIS — E669 Obesity, unspecified: Secondary | ICD-10-CM

## 2022-07-01 NOTE — Progress Notes (Signed)
PRENATAL VISIT NOTE  Subjective:  Karina Murphy is a 41 y.o. G3P2002 at [redacted]w[redacted]d being seen today for ongoing prenatal care.  Patient is Spanish-speaking only, interpreter present for this encounter. She is currently monitored for the following issues for this high-risk pregnancy and has Obese; Rheumatoid arthritis ([place]); Supervision of high risk pregnancy, antepartum; AMA (advanced maternal age) multigravida 2+; Language barrier; and Nausea and vomiting during pregnancy on their problem list.  Patient reports  occasional foot pain due to RA .   . Vag. Bleeding: None.  Movement: Present. Denies leaking of fluid.   The following portions of the patient's history were reviewed and updated as appropriate: allergies, current medications, past family history, past medical history, past social history, past surgical history and problem list.   Objective:   Vitals:   07/01/22 1000  BP: (!) 106/53  Pulse: 88  Weight: 173 lb (78.5 kg)    Fetal Status: Fetal Heart Rate (bpm): 144   Movement: Present     General:  Alert, oriented and cooperative. Patient is in no acute distress.  Skin: Skin is warm and dry. No rash noted.   Cardiovascular: Normal heart rate noted  Respiratory: Normal respiratory effort, no problems with respiration noted  Abdomen: Soft, gravid, appropriate for gestational age.  Pain/Pressure: Present     Pelvic: Cervical exam deferred        Extremities: Normal range of motion.     Mental Status: Normal mood and affect. Normal behavior. Normal judgment and thought content.   [place] MFM OB FOLLOW UP  Result Date: 06/03/2022 ----------------------------------------------------------------------  OBSTETRICS REPORT                       (Signed Final 06/03/2022 11:20 am) ---------------------------------------------------------------------- Patient Info  ID #:       [id number]                          D.O.B.:  May 13, 1981 (41 yrs)  Name:       Karina HAINSWORTH-                Visit  Date: 06/03/2022 11:09 am              [place] ---------------------------------------------------------------------- Performed By  Attending:        Valeda Malm DO       Referred By:      Patriciaann Clan  Performed By:     Eveline Keto         [facility] ---------------------------------------------------------------------- Orders  #  Description                           Code        Ordered By  1  [place] MFM OB FOLLOW UP                   [phone]    Valeda Malm ----------------------------------------------------------------------  #  Order #                     Accession #                Episode #  1  [id number]                   [id number]                 [id number] ---------------------------------------------------------------------- Indications  Advanced maternal age multigravida 54+,        O17.522  second trimester (41)  [redacted] weeks gestation of pregnancy                123456  Obesity complicating pregnancy, second         O99.212  trimester  Medical complication of pregnancy              O26.90  (Rheumatoid arthritis)  LR NIPS, Neg [place] ---------------------------------------------------------------------- Vital Signs  BP:          119/60 ---------------------------------------------------------------------- Fetal Evaluation  Num Of Fetuses:         1  Fetal Heart Rate(bpm):  141  Cardiac Activity:       Observed  Presentation:           Cephalic  Placenta:               Anterior  P. Cord Insertion:      Previously Visualized  Amniotic Fluid  AFI FV:      Within normal limits  AFI Sum(cm)     %Tile       Largest Pocket(cm)  12.82           33          3.76  RUQ(cm)       RLQ(cm)       LUQ(cm)        LLQ(cm)   3.14          3.76          3.65           2.27 ---------------------------------------------------------------------- Biometry  BPD:     63.95  mm     G. Age:  25w 6d         35  %    CI:        76.78   %    70 - 86  FL/HC:      19.7   %    18.6 - 20.4  HC:    231.17   mm     G. Age:  25w 1d          7  %    HC/AC:      1.09        1.04 - 1.22  AC:    212.33   mm     G. Age:  25w 5d         33  %    FL/BPD:     71.2   %    71 - 87  FL:      45.54  mm     G. Age:  25w 1d         13  %    FL/AC:      21.4   %    20 - 24  LV:        3.5  mm  Est. FW:     812  gm    1 lb 13 oz      19  % ---------------------------------------------------------------------- OB History  Gravidity:    3         Term:   2  Living:       2 ---------------------------------------------------------------------- Gestational Age  LMP:           27w 1d        Date:  11/25/21                  EDD:   09/01/22  U/S Today:     25w 3d                                        EDD:   09/13/22  Best:          26w 0d     Det. By:  Loman Chroman         EDD:   09/09/22                                      (01/26/22) ---------------------------------------------------------------------- Anatomy  Cranium:               Appears normal         Diaphragm:              Appears normal  Cavum:                 Appears normal         Stomach:                Appears normal, left                                                                        sided  Ventricles:            Appears normal         Kidneys:  Appear normal  Lips:                  Appears normal         Bladder:                Appears normal  Heart:                 Appears normal                         (4CH, axis, and                         situs) ---------------------------------------------------------------------- Cervix Uterus Adnexa  Cervix  Not visualized (advanced GA >24wks)  Uterus  No abnormality visualized.  Right Ovary   Not visualized.  Left Ovary  Not visualized. ---------------------------------------------------------------------- Comments  Follow-up growth ultrasound at 26w 0d with EDD of  09/09/2022 dated by Early Ultrasound  (01/26/22). Pregnancy  complicated by AMA and RA.  Sonographic findings  Single intrauterine pregnancy at 26w 0d.  Observed fetal cardiac activity.  Cephalic presentation.  Interval fetal anatomy appears normal.  Fetal biometry shows the estimated fetal weight at the 19  percentile.  Amniotic fluid volume: Within normal limits. AFI: 12.82 cm.  MVP: 3.76 cm.  Placenta: Anterior.  Recommendations  - F/u in 4 weeks for growth [place] ----------------------------------------------------------------------                  Valeda Malm, DO Electronically Signed Final Report   06/03/2022 11:20 am ----------------------------------------------------------------------   Assessment and Plan:  Pregnancy: G3P2002 at [redacted]w[redacted]d 1. Rheumatoid arthritis of other site, unspecified whether rheumatoid factor present ([place]) Finally has appointment next month, will follow up recommendations.   2. Multigravida of advanced maternal age in third trimester 3. [redacted] weeks gestation of pregnancy 4. Supervision of high risk pregnancy, antepartum Follow up scans and recommendations as per MFM. Preterm labor symptoms and general obstetric precautions including but not limited to vaginal bleeding, contractions, leaking of fluid and fetal movement were reviewed in detail with the patient. Please refer to After Visit Summary for other counseling recommendations.   Return in about 2 weeks (around 07/15/2022) for OFFICE OB VISIT (MD only).  Future Appointments  Date Time Provider [place]  07/01/2022  3:30 PM [facility] NURSE [facility]  07/01/2022  3:45 PM [facility] US1 [facility]  07/12/2022  8:15 AM Aletha Halim, MD [facility]  07/26/2022  8:15 AM Chancy Milroy, MD [facility]  07/29/2022  3:30 PM [facility] NURSE  [facility]  07/29/2022  3:45 PM [facility] US4 [facility]  07/30/2022  9:20 AM Benjamine Mola, Resa Miner, MD [facility] None  08/02/2022  8:35 AM Chancy Milroy, MD [facility]  08/09/2022  8:55 AM Chancy Milroy, MD [facility]    Verita Schneiders, MD

## 2022-07-02 ENCOUNTER — Other Ambulatory Visit: Payer: Self-pay | Admitting: *Deleted

## 2022-07-02 DIAGNOSIS — O99891 Other specified diseases and conditions complicating pregnancy: Secondary | ICD-10-CM

## 2022-07-12 ENCOUNTER — Other Ambulatory Visit: Payer: Self-pay

## 2022-07-12 ENCOUNTER — Ambulatory Visit (INDEPENDENT_AMBULATORY_CARE_PROVIDER_SITE_OTHER): Payer: Self-pay | Admitting: Obstetrics and Gynecology

## 2022-07-12 VITALS — BP 105/72 | HR 81 | Wt 169.8 lb

## 2022-07-12 DIAGNOSIS — O0993 Supervision of high risk pregnancy, unspecified, third trimester: Secondary | ICD-10-CM

## 2022-07-12 DIAGNOSIS — Z3A31 31 weeks gestation of pregnancy: Secondary | ICD-10-CM

## 2022-07-12 DIAGNOSIS — Z789 Other specified health status: Secondary | ICD-10-CM

## 2022-07-12 DIAGNOSIS — M069 Rheumatoid arthritis, unspecified: Secondary | ICD-10-CM

## 2022-07-12 DIAGNOSIS — O0990 Supervision of high risk pregnancy, unspecified, unspecified trimester: Secondary | ICD-10-CM

## 2022-07-12 DIAGNOSIS — O09523 Supervision of elderly multigravida, third trimester: Secondary | ICD-10-CM

## 2022-07-12 MED ORDER — FERROUS SULFATE 325 (65 FE) MG PO TABS
325.0000 mg | ORAL_TABLET | ORAL | 1 refills | Status: DC
  Filled 2022-07-12: qty 30, 60d supply, fill #0

## 2022-07-12 MED ORDER — DOCUSATE SODIUM 100 MG PO CAPS
100.0000 mg | ORAL_CAPSULE | Freq: Two times a day (BID) | ORAL | 0 refills | Status: DC | PRN
Start: 2022-07-12 — End: 2022-08-09

## 2022-07-12 NOTE — Progress Notes (Signed)
   PRENATAL VISIT NOTE  Subjective:  Karina Murphy is a 41 y.o. G3P2002 at [redacted]w[redacted]d being seen today for ongoing prenatal care.  She is currently monitored for the following issues for this high-risk pregnancy and has Obese; Rheumatoid arthritis ([place]); Supervision of high risk pregnancy, antepartum; AMA (advanced maternal age) multigravida 20+; Language barrier; and Nausea and vomiting during pregnancy on their problem list.  Patient reports  nausea and HA in the morning that resolves by noon .  Contractions: Not present. Vag. Bleeding: None.  Movement: Present. Denies leaking of fluid.   The following portions of the patient's history were reviewed and updated as appropriate: allergies, current medications, past family history, past medical history, past social history, past surgical history and problem list.   Objective:   Vitals:   07/12/22 0825  BP: 105/72  Pulse: 81  Weight: 169 lb 12.8 oz (77 kg)    Fetal Status: Fetal Heart Rate (bpm): 140   Movement: Present     General:  Alert, oriented and cooperative. Patient is in no acute distress.  Skin: Skin is warm and dry. No rash noted.   Cardiovascular: Normal heart rate noted  Respiratory: Normal respiratory effort, no problems with respiration noted  Abdomen: Soft, gravid, appropriate for gestational age.  Pain/Pressure: Absent     Pelvic: Cervical exam deferred        Extremities: Normal range of motion.  Edema: Trace  Mental Status: Normal mood and affect. Normal behavior. Normal judgment and thought content.   Assessment and Plan:  Pregnancy: G3P2002 at [redacted]w[redacted]d 1. [redacted] weeks gestation of pregnancy Normal 28wk labs except slight anemia. Qod iron sent in and recommended she take with vitamin c Watch weights. Pt states she is taking the ensure as recommended by another provider. 169lbs today vs 173lbs on 10/19. I told her to try and make sure she is well hydrated and eats something in the morning as her s/s could be from low  blood sugar. I also recommended she try six small meals during the day and continue on the ensure. May need nutrition consult  F/u mfm scans, last one 10/19: 17%, 1373g, ac 20%, afi 10 2. Supervision of high risk pregnancy, antepartum  3. Language barrier Interpreter used  4. Multigravida of advanced maternal age in third trimester  5. Rheumatoid arthritis of other site, unspecified whether rheumatoid factor present [facility]) Has consult visit with rheum on 11/17  Preterm labor symptoms and general obstetric precautions including but not limited to vaginal bleeding, contractions, leaking of fluid and fetal movement were reviewed in detail with the patient. Please refer to After Visit Summary for other counseling recommendations.   Return in about 2 weeks (around 07/26/2022).  Future Appointments  Date Time Provider [place]  07/26/2022  8:15 AM Chancy Milroy, MD [facility]  07/29/2022  3:30 PM [facility] NURSE [facility]  07/29/2022  3:45 PM [facility] US4 [facility]  07/30/2022  9:20 AM Benjamine Mola, Resa Miner, MD [facility] None  08/02/2022  8:35 AM Chancy Milroy, MD [facility]  08/09/2022  8:55 AM Chancy Milroy, MD [facility]  08/26/2022  3:30 PM [facility] NURSE [facility]  08/26/2022  3:45 PM [facility] US5 [facility]    Aletha Halim, MD

## 2022-07-13 ENCOUNTER — Other Ambulatory Visit: Payer: Self-pay

## 2022-07-26 ENCOUNTER — Other Ambulatory Visit: Payer: Self-pay

## 2022-07-26 ENCOUNTER — Encounter: Payer: Self-pay | Admitting: Obstetrics and Gynecology

## 2022-07-26 ENCOUNTER — Ambulatory Visit (INDEPENDENT_AMBULATORY_CARE_PROVIDER_SITE_OTHER): Payer: Self-pay | Admitting: Obstetrics and Gynecology

## 2022-07-26 VITALS — BP 113/79 | HR 70 | Wt 169.0 lb

## 2022-07-26 DIAGNOSIS — Z3A33 33 weeks gestation of pregnancy: Secondary | ICD-10-CM

## 2022-07-26 DIAGNOSIS — O0990 Supervision of high risk pregnancy, unspecified, unspecified trimester: Secondary | ICD-10-CM

## 2022-07-26 DIAGNOSIS — O09523 Supervision of elderly multigravida, third trimester: Secondary | ICD-10-CM

## 2022-07-26 DIAGNOSIS — M069 Rheumatoid arthritis, unspecified: Secondary | ICD-10-CM

## 2022-07-26 DIAGNOSIS — Z789 Other specified health status: Secondary | ICD-10-CM

## 2022-07-26 NOTE — Progress Notes (Signed)
Subjective:  Karina Murphy is a 41 y.o. G3P2002 at [redacted]w[redacted]d being seen today for ongoing prenatal care.  She is currently monitored for the following issues for this low-risk pregnancy and has Obese; Rheumatoid arthritis ([facility]); Supervision of high risk pregnancy, antepartum; AMA (advanced maternal age) multigravida 35+; Language barrier; and Nausea and vomiting during pregnancy on their problem list.  Patient reports  general complaints of pregnancy .  Contractions: Irritability. Vag. Bleeding: None.  Movement: Present. Denies leaking of fluid.   The following portions of the patient's history were reviewed and updated as appropriate: allergies, current medications, past family history, past medical history, past social history, past surgical history and problem list. Problem list updated.  Objective:   Vitals:   07/26/22 0823  BP: 113/79  Pulse: 70  Weight: 169 lb (76.7 kg)    Fetal Status: Fetal Heart Rate (bpm): 131   Movement: Present     General:  Alert, oriented and cooperative. Patient is in no acute distress.  Skin: Skin is warm and dry. No rash noted.   Cardiovascular: Normal heart rate noted  Respiratory: Normal respiratory effort, no problems with respiration noted  Abdomen: Soft, gravid, appropriate for gestational age. Pain/Pressure: Present     Pelvic:  Cervical exam deferred        Extremities: Normal range of motion.  Edema: None  Mental Status: Normal mood and affect. Normal behavior. Normal judgment and thought content.   Urinalysis:      Assessment and Plan:  Pregnancy: G3P2002 at [redacted]w[redacted]d  1. Supervision of high risk pregnancy, antepartum Stable Growth scan this week  2. Multigravida of advanced maternal age in third trimester Stable  3. Rheumatoid arthritis of other site, unspecified whether rheumatoid factor present [facility]) Has consult with Rheumatology this week  4. Language barrier Live interrupter used during today's visit  Preterm labor symptoms  and general obstetric precautions including but not limited to vaginal bleeding, contractions, leaking of fluid and fetal movement were reviewed in detail with the patient. Please refer to After Visit Summary for other counseling recommendations.  Return in about 2 weeks (around 08/09/2022) for OB visit, face to face, MD only.   Hermina Staggers, MD

## 2022-07-26 NOTE — Patient Instructions (Signed)
Tercer trimestre de embarazo Third Trimester of Pregnancy  El tercer trimestre de embarazo va desde la semana 28 hasta la semana 40. Tambin se dice que va desde el mes 7 hasta el mes 9. En este trimestre, el beb en gestacin (feto) crece muy rpidamente. Hacia el final del noveno mes, el beb en gestacin mide alrededor de 20 pulgadas (45 cm) de largo. Pesa entre 6 y 10 libras (2,70 y 4,50 kg). Cambios en el cuerpo durante el tercer trimestre Su organismo contina atravesando por muchos cambios durante este perodo. Los cambios varan y generalmente vuelven a la normalidad despus del nacimiento del beb. Cambios fsicos Seguir aumentando de peso. Puede ser que aumente entre 25 y 35 libras (11 y 16 kg) hacia el final del embarazo. Si tiene bajo peso, puede aumentar entre 28 y 40 lb (unos 13 a 18 kg). Si tiene sobrepeso, puede aumentar entre 15 y 25 libras (unos 7 a 11 kg). Podrn aparecer las primeras estras en las caderas, el vientre (abdomen) y las mamas. Las mamas seguirn creciendo y pueden doler. Un lquido amarillo (calostro) puede salir de sus pechos. Esta es la primera leche que usted produce para el beb. Tal vez haya cambios en el cabello. El ombligo puede salir hacia afuera. Puede observar que se le hinchan ms las manos, la cara o los tobillos. Cambios en la salud Es posible que tenga acidez estomacal. Es posible que tenga dificultades para defecar (estreimiento). Pueden aparecerle hemorroides. Estas son venas hinchadas en el ano que pueden picar o doler. Puede comenzar a tener venas hinchadas (vrices) en las piernas. Puede presentar ms dolor en la pelvis, la espalda o los muslos. Puede presentar ms hormigueo o entumecimiento en las manos, los brazos y las piernas. La piel de su vientre tambin puede sentirse entumecida. Es posible que sienta falta de aire a medida que el tero se agranda. Otros cambios Es posible que haga pis (orine) con mayor frecuencia. Puede tener ms  problemas para dormir. Puede notar que el beb en gestacin "baja" o se mueve ms hacia bajo, en el vientre. Puede notar ms secrecin proveniente de la vagina. Puede sentir las articulaciones flojas y puede sentir dolor alrededor del hueso plvico. Siga estas instrucciones en su casa: Medicamentos Use los medicamentos de venta libre y los recetados solamente como se lo haya indicado el mdico. Algunos medicamentos no son seguros durante el embarazo. Tome vitaminas prenatales que contengan por lo menos 600 microgramos (mcg) de cido flico. Comida y bebida Consuma comidas saludables que incluyan lo siguiente: Frutas y verduras frescas. Cereales integrales. Buenas fuentes de protenas, como carne, huevos y tofu. Productos lcteos con bajo contenido de grasa. Evite la carne cruda y el jugo, la leche y el queso sin pasteurizar. Estos portan grmenes que pueden provocar dao tanto a usted como al beb. Tome 4 o 5 comidas pequeas en lugar de 3 comidas abundantes al da. Es posible que deba tomar medidas para prevenir o tratar los problemas para defecar: Beber suficiente lquido para mantener el pis (orina) de color amarillo plido. Come alimentos ricos en fibra. Entre ellos, frijoles, cereales integrales y frutas y verduras frescas. Limitar los alimentos con alto contenido de grasa y azcar. Estos incluyen alimentos fritos o dulces. Actividad Haga ejercicios solamente como se lo haya indicado el mdico. Interrumpa la actividad fsica si comienza a tener clicos en el tero. Evite levantar pesos excesivos. No haga ejercicio si hace demasiado calor, hay demasiada humedad o se encuentra en un lugar de mucha   altura (altitud elevada). Si lo desea, puede continuar teniendo relaciones sexuales, a menos que el mdico le indique lo contrario. Alivio del dolor y del malestar Haga pausas con frecuencia y descanse con las piernas levantadas (elevadas) si tiene calambres en las piernas o dolor en la parte  baja de la espalda. Dese baos de asiento con agua tibia para aliviar el dolor o las molestias causadas por las hemorroides. Use una crema para las hemorroides si el mdico la autoriza. Use un sostn que le brinde buen soporte si sus mamas estn sensibles. Si desarrolla venas hinchadas y abultadas en las piernas: Use medias de compresin segn las indicaciones de su mdico. Levante los pies durante 15 minutos, 3 o 4 veces por da. Limite la sal en sus alimentos. Seguridad Hable con el mdico antes de recorrer largas distancias. No se d baos de inmersin en agua caliente, baos turcos ni saunas. Use el cinturn de seguridad en todo momento mientras vaya en auto. Hable con el mdico si alguien le est haciendo dao o gritando mucho. Preparacin para la llegada del beb Para prepararse para la llegada de su beb: Tome clases prenatales. Visite el hospital y recorra el rea de maternidad. Compre un asiento de seguridad orientado hacia atrs para llevar al beb en el automvil. Aprenda cmo instalarlo en el auto. Prepare la habitacin del beb. Saque todas las almohadas y los animales de peluche de la cuna del beb. Instrucciones generales Evite el contacto con las bandejas sanitarias de los gatos y la tierra que estos animales usan. Estos contienen grmenes que pueden daar al beb y causar la prdida del beb ya sea aborto espontneo o muerte fetal. No se haga duchas vaginales ni use tampones. No use tampones ni toallas higinicas perfumadas. No fume ni consuma ningn producto que contenga nicotina o tabaco. Si necesita ayuda para dejar de fumar, consulte al mdico. No beba alcohol. No use medicamentos a base de hierbas, drogas ilegales, ni medicamentos que el mdico no haya autorizado. Las sustancias qumicas de estos productos pueden afectar al beb. Cumpla con todas las visitas de seguimiento. Esto es importante. Dnde buscar ms informacin American Pregnancy Association (Asociacin  Americana del Embarazo): americanpregnancy.org American College of Obstetricians and Gynecologists (Colegio Estadounidense de Obstetras y Gineclogos): www.acog.org Office on Women's Health (Oficina para la Salud de la Mujer): womenshealth.gov/pregnancy Comunquese con un mdico si: Tiene fiebre. Tiene clicos leves o siente presin en la parte baja del vientre. Sufre un dolor persistente en el abdomen. Vomita o hace deposiciones acuosas (diarrea). Advierte lquido con mal olor que proviene de la vagina. Siente dolor al orinar o hace orina con mal olor. Tiene un dolor de cabeza que no desaparece despus de tomar analgsicos. Nota cambios en la visin o ve manchas delante de los ojos. Solicite ayuda de inmediato si: Rompe la bolsa. Tiene contracciones regulares separadas por menos de 5 minutos. Tiene sangrado o pequeas prdidas vaginales. Tiene clicos o dolor muy intensos en el vientre. Tiene dificultad para respirar. Sientes dolor en el pecho. Se desmaya. No ha sentido al beb moverse durante el tiempo que le indic el mdico. Tiene dolor, hinchazn o enrojecimiento nuevos en un brazo o una pierna o se produce un aumento de alguno de estos sntomas. Resumen El tercer trimestre comprende desde la semana 28 hasta la semana 40 (desde el mes 7 hasta el mes 9). Esta es la poca en que el beb en gestacin crece muy rpidamente. Durante este perodo, las molestias pueden aumentar a medida que usted   sube de peso y el beb crece. Preprese para la llegada del beb: asista a las clases prenatales, compre un asiento de seguridad orientado hacia atrs para llevar al beb en auto y prepare la habitacin del beb. Solicite ayuda de inmediato si tiene sangrado por la vagina, siente dolor en el pecho y tiene dificultad para respirar, o si no ha sentido al beb moverse durante el tiempo que le indic el mdico. Esta informacin no tiene como fin reemplazar el consejo del mdico. Asegrese de hacerle al  mdico cualquier pregunta que tenga. Document Revised: 03/12/2020 Document Reviewed: 03/12/2020 Elsevier Patient Education  2023 [employer]  

## 2022-07-29 ENCOUNTER — Ambulatory Visit: Payer: Self-pay | Admitting: *Deleted

## 2022-07-29 ENCOUNTER — Ambulatory Visit: Payer: Self-pay | Attending: Maternal & Fetal Medicine

## 2022-07-29 VITALS — BP 114/70 | HR 79

## 2022-07-29 DIAGNOSIS — O09523 Supervision of elderly multigravida, third trimester: Secondary | ICD-10-CM | POA: Insufficient documentation

## 2022-07-29 DIAGNOSIS — O99213 Obesity complicating pregnancy, third trimester: Secondary | ICD-10-CM

## 2022-07-29 DIAGNOSIS — E669 Obesity, unspecified: Secondary | ICD-10-CM

## 2022-07-29 DIAGNOSIS — O0990 Supervision of high risk pregnancy, unspecified, unspecified trimester: Secondary | ICD-10-CM | POA: Insufficient documentation

## 2022-07-29 DIAGNOSIS — O09522 Supervision of elderly multigravida, second trimester: Secondary | ICD-10-CM | POA: Insufficient documentation

## 2022-07-29 DIAGNOSIS — M069 Rheumatoid arthritis, unspecified: Secondary | ICD-10-CM

## 2022-07-29 DIAGNOSIS — O99891 Other specified diseases and conditions complicating pregnancy: Secondary | ICD-10-CM

## 2022-07-29 DIAGNOSIS — Z3A34 34 weeks gestation of pregnancy: Secondary | ICD-10-CM

## 2022-07-29 NOTE — Progress Notes (Unsigned)
Office Visit Note  Patient: Karina Murphy             Date of Birth: July 18, 1981           MRN: [id number]             PCP: Karina Pounds, NP Referring: Karina Murphy* Visit Date: 07/30/2022 Occupation: @GUAROCC @  Subjective:  New Patient (Initial Visit) (Transfer of care, [place] x 4 years ago)   History of Present Illness: Karina Murphy is a 41 y.o. female here for seropositive RA. She was previously seeing rheumatology after onset of symptoms around 2014 with hand and foot inflammation initially. Previous treatments including methotrexate and sulfasalazine and prednisone. ***   Swan neck deformities multiple right hand, left hand 5th digit, reducible MCP chronic enlarged, nontender Right knee crepitus  Right foot red, hot, squeeze tenderness ***  Activities of Daily Living:  Patient reports morning stiffness for 24 hours.   Patient Reports nocturnal pain.  Difficulty dressing/grooming: Reports Difficulty climbing stairs: Reports Difficulty getting out of chair: Reports Difficulty using hands for taps, buttons, cutlery, and/or writing: Reports  Review of Systems  Constitutional:  Positive for fatigue.  HENT:  Positive for mouth dryness. Negative for mouth sores.   Eyes:  Positive for dryness.  Respiratory:  Positive for shortness of breath.   Cardiovascular:  Positive for palpitations. Negative for chest pain.  Gastrointestinal:  Negative for blood in stool, constipation and diarrhea.  Endocrine: Positive for increased urination.  Genitourinary:  Negative for involuntary urination.  Musculoskeletal:  Positive for joint pain, joint pain, joint swelling, myalgias, muscle weakness, morning stiffness, muscle tenderness and myalgias. Negative for gait problem.  Skin:  Positive for hair loss. Negative for color change, rash and sensitivity to sunlight.  Allergic/Immunologic: Positive for susceptible to infections.  Neurological:   Positive for dizziness and headaches.  Hematological:  Negative for swollen glands.  Psychiatric/Behavioral:  Positive for sleep disturbance. Negative for depressed mood. The patient is nervous/anxious.     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    Immunization History  Administered Date(s) Administered   Influenza,inj,Quad PF,6+ Mos 06/28/2014, 09/17/2016, 06/14/2022   Tdap 06/14/2022     Objective: Vital Signs: BP 108/71 (BP Location:  Right Arm, Patient Position: Sitting, Cuff Size: Normal)   Pulse 82   Resp 15   Ht 5' 3.75" (1.619 m)   Wt 169 lb (76.7 kg)   LMP 11/25/2021   BMI 29.24 kg/m    Physical Exam   Musculoskeletal Exam: ***  CDAI Exam: CDAI Score: -- Patient Global: --; Provider Global: -- Swollen: --; Tender: -- Joint Exam 07/30/2022   No joint exam has been documented for this visit   There is currently no information documented on the  homunculus. Go to the Rheumatology activity and complete the homunculus joint exam.  Investigation: No additional findings.  Imaging: [place] MFM OB FOLLOW UP  Result Date: 07/29/2022 ----------------------------------------------------------------------  OBSTETRICS REPORT                       (Signed Final 07/29/2022 04:45 pm) ---------------------------------------------------------------------- Patient Info  ID #:       [id number]                          D.O.B.:  [birth year] (41 yrs)  Name:       Karina Murphy-                Visit Date: 07/29/2022 03:41 pm              LLUCK ---------------------------------------------------------------------- Performed By  Attending:        Johnell Comings MD         Referred By:      Karina Murphy  Performed By:     Karina Murphy            [facility]                                     [facility]                                                             [place] for                                                             Women ---------------------------------------------------------------------- Orders  #  Description                           Code        Ordered By  1  [place] MFM OB FOLLOW UP  [id number]    Karina Murphy ----------------------------------------------------------------------  #  Order #                     Accession #                Episode #  1  [id number]                   [id number]                 [id number] ---------------------------------------------------------------------- Indications  Advanced maternal age multigravida 60+,        O85.523  third trimester (41)  Obesity complicating pregnancy, third          O99.213  trimester  Medical complication of pregnancy              O26.90  (Rheumatoid arthritis)  [redacted] weeks gestation of pregnancy                Z3A.34  LR NIPS, Neg Horizon  ---------------------------------------------------------------------- Fetal Evaluation  Num Of Fetuses:         1  Fetal Heart Rate(bpm):  151  Cardiac Activity:       Observed  Presentation:           Cephalic  Placenta:               Anterior  P. Cord Insertion:      Previously Visualized  Amniotic Fluid  AFI FV:      Within normal limits  AFI Sum(cm)     %Tile       Largest Pocket(cm)  8.3             6           3.2  RUQ(cm)       RLQ(cm)       LUQ(cm)        LLQ(cm)  2.4           0.8           3.2            1.9 ---------------------------------------------------------------------- Biometry  BPD:        87  mm     G. Age:  35w 1d         79  %    CI:        80.77   %    70 - 86                                                          FL/HC:      20.2   %    19.4 - 21.8  HC:      305.7  mm     G. Age:  34w 0d         16  %    HC/AC:      1.02        0.96 - 1.11  AC:       301   mm     G. Age:  34w 0d         56  %    FL/BPD:     70.8   %    71 - 87  FL:  61.6  mm     G. Age:  32w 0d        4.2  %    FL/AC:      20.5   %    20 - 24  HUM:      57.6  mm     G. Age:  33w 3d         46  %  Est. FW:    2225  gm    4 lb 14 oz      31  % ---------------------------------------------------------------------- OB History  Gravidity:    3         Term:   2  Living:       2 ---------------------------------------------------------------------- Gestational Age  LMP:           35w 1d        Date:  11/25/21                  EDD:   09/01/22  U/S Today:     33w 6d                                        EDD:   09/10/22  Best:          34w 0d     Det. ByMarcella Dubs         EDD:   09/09/22                                      (01/26/22) ---------------------------------------------------------------------- Anatomy  Cranium:               Appears normal         Aortic Arch:            Previously seen  Cavum:                 Previously seen        Ductal Arch:            Previously seen  Ventricles:            Appears  normal         Diaphragm:              Appears normal  Choroid Plexus:        Previously seen        Stomach:                Appears normal, left                                                                        sided  Cerebellum:            Previously seen        Abdomen:                Appears normal  Posterior Fossa:       Previously seen        Abdominal Wall:  Previously seen  Nuchal Fold:           Previously seen        Cord Vessels:           Previously seen  Face:                  Orbits and profile     Kidneys:                Appear normal                         previously seen  Lips:                  Previously seen        Bladder:                Appears normal  Thoracic:              Previously seen        Spine:                  Appears normal  Heart:                 Appears normal         Upper Extremities:      Previously seen                         (4CH, axis, and                         situs)  RVOT:                  Previously seen        Lower Extremities:      Previously seen  LVOT:                  Previously seen  Other:  Female gender previously seen. Heels/feet, Nasal bone, lenses, maxilla,          mandible and falx previously visualized. ---------------------------------------------------------------------- Cervix Uterus Adnexa  Cervix  Not visualized (advanced GA >24wks)  Uterus  No abnormality visualized.  Right Ovary  Not visualized.  Left Ovary  Not visualized.  Cul De Sac  No free fluid seen.  Adnexa  No adnexal mass visualized. ---------------------------------------------------------------------- Comments  This patient was seen for a follow up growth scan due to  advanced maternal age and rheumatoid arthritis.  She denies  any problems since her last exam.  She was informed that the fetal growth and amniotic fluid  level appears appropriate for her gestational age.  A follow up exam was scheduled in 4 weeks.  ----------------------------------------------------------------------                   Karina Comings, MD Electronically Signed Final Report   07/29/2022 04:45 pm ----------------------------------------------------------------------  [place] MFM OB FOLLOW UP  Result Date: 07/01/2022 ----------------------------------------------------------------------  OBSTETRICS REPORT                       (Signed Final 07/01/2022 04:16 pm) ---------------------------------------------------------------------- Patient Info  ID #:       [id number]                          D.O.B.:  [birth year] (41 yrs)  Name:  [facility]ELA [facility]-                Visit Date: 07/01/2022 03:58 pm              LLUCK ---------------------------------------------------------------------- Performed By  Attending:        Valeda Malm DO       Referred By:      Karina Murphy  Performed By:     Eveline Keto         [facility]                                     [facility] at                                                             [place] for                                                             Women ---------------------------------------------------------------------- Orders  #  Description                           Code        Ordered By  1  [place] MFM OB FOLLOW UP                   76816.01    Karina Murphy ----------------------------------------------------------------------  #  Order #                     Accession #                Episode #  1  [id number]                   [id number]                 [id number] ---------------------------------------------------------------------- Indications  Advanced maternal age multigravida 20+,        O63.523  third trimester (41)  [redacted] weeks gestation of pregnancy                [id number]  Obesity complicating pregnancy, third          O99.213  trimester  Medical complication of pregnancy               O26.90  (Rheumatoid arthritis)  LR NIPS, Neg Horizon ---------------------------------------------------------------------- Vital Signs  BP:          113/61 ----------------------------------------------------------------------  Fetal Evaluation  Num Of Fetuses:         1  Fetal Heart Rate(bpm):  145  Cardiac Activity:       Observed  Presentation:           Cephalic  Placenta:               Anterior  P. Cord Insertion:      Previously Visualized  Amniotic Fluid  AFI FV:      Within normal limits  AFI Sum(cm)     %Tile       Largest Pocket(cm)  10.2            15          5   RUQ(cm)       RLQ(cm)       LUQ(cm)        LLQ(cm)  0             2.7           5              2.5 ---------------------------------------------------------------------- Biometry  BPD:      75.8  mm     G. Age:  30w 3d         51  %    CI:        78.09   %    70 - 86                                                          FL/HC:      20.5   %    19.2 - 21.4  HC:      271.4  mm     G. Age:  29w 4d         10  %    HC/AC:      1.09        0.99 - 1.21  AC:      248.9  mm     G. Age:  29w 1d         20  %    FL/BPD:     73.5   %    71 - 87  FL:       55.7  mm     G. Age:  29w 2d         18  %    FL/AC:      22.4   %    20 - 24  Est. FW:    1373  gm           3 lb     17  % ---------------------------------------------------------------------- OB History  Gravidity:    3         Term:   2  Living:       2 ---------------------------------------------------------------------- Gestational Age  LMP:           31w 1d        Date:  11/25/21                  EDD:   09/01/22  U/S Today:     29w 4d  EDD:   09/12/22  Best:          Weston Settle 0d     Det. ByLoman Chroman         EDD:   09/09/22                                      (01/26/22) ---------------------------------------------------------------------- Anatomy  Cranium:               Appears normal         Stomach:                Appears normal, left                                                                         sided  Cavum:                 Appears normal         Kidneys:                Appear normal  Heart:                 Appears normal         Bladder:                Appears normal                         (4CH, axis, and                         situs)  Diaphragm:             Appears normal  Other:  VC, 3VV and 3VTV visualized. ---------------------------------------------------------------------- Cervix Uterus Adnexa  Cervix  Not visualized (advanced GA >24wks)  Uterus  No abnormality visualized.  Right Ovary  Not visualized.  Left Ovary  Not visualized. ---------------------------------------------------------------------- Comments  Follow-up growth ultrasound at 30w 0d with EDD of  09/09/2022 dated by [facility]  (01/26/22). Pregnancy  complicated by RA and AMA. Aneuploidy screening: low risk  NIPS. She continues to have pain in her hands and feet from  RA. She has discussed this with her OB and a referral to  rheumatology is pending per the chart.  Sonographic findings  Single intrauterine pregnancy at 30w 0d.  Observed fetal cardiac activity.  Cephalic presentation.  Interval fetal anatomy appears normal. The anatomy is now  cleared.  Fetal biometry shows the estimated fetal weight at the 17  percentile.  Amniotic fluid volume: Within normal limits. AFI: 10.2 cm.  MVP: 5. cm.  Placenta: Anterior.  Recommendations  - F/u every 4 weeks for growth until delivery  - Rheumatology appointment requested by her OB. Please  contact us if any questions about medication in pregnancy  arise.  - Monitor for postpartum RA flare ----------------------------------------------------------------------                   Karina Malm, DO Electronically Signed Final Report   07/01/2022 04:16 pm ----------------------------------------------------------------------   Recent Labs: Lab Results  Component Value Date  WBC 5.5 06/14/2022   HGB 10.2 (L) 06/14/2022   PLT  339 06/14/2022   NA 138 12/01/2021   K 3.8 12/01/2021   CL 105 12/01/2021   CO2 26 12/01/2021   GLUCOSE 104 (H) 12/01/2021   BUN 12 12/01/2021   CREATININE 0.71 12/01/2021   BILITOT 0.7 12/01/2021   ALKPHOS 56 12/01/2021   AST 14 12/01/2021   ALT 13 12/01/2021   PROT 7.3 12/01/2021   ALBUMIN 4.0 12/01/2021   CALCIUM 9.3 12/01/2021   GFRAA 129 03/19/2020    Speciality Comments: No specialty comments available.  Procedures:  No procedures performed Allergies: Pineapple and Morphine   Assessment / Plan:     Visit Diagnoses: No diagnosis found.  Orders: No orders of the defined types were placed in this encounter.  No orders of the defined types were placed in this encounter.   Face-to-face time spent with patient was *** minutes. Greater than 50% of time was spent in counseling and coordination of care.  Follow-Up Instructions: No follow-ups on file.   Collier Salina, MD  Note - This record has been created using [employer].  Chart creation errors have been sought, but may not always  have been located. Such creation errors do not reflect on  the standard of medical care.

## 2022-07-30 ENCOUNTER — Ambulatory Visit: Payer: Self-pay | Attending: Internal Medicine | Admitting: Internal Medicine

## 2022-07-30 ENCOUNTER — Encounter: Payer: Self-pay | Admitting: Internal Medicine

## 2022-07-30 ENCOUNTER — Other Ambulatory Visit: Payer: Self-pay

## 2022-07-30 VITALS — BP 108/71 | HR 82 | Resp 15 | Ht 63.75 in | Wt 169.0 lb

## 2022-07-30 DIAGNOSIS — M069 Rheumatoid arthritis, unspecified: Secondary | ICD-10-CM

## 2022-07-30 DIAGNOSIS — Z79899 Other long term (current) drug therapy: Secondary | ICD-10-CM

## 2022-07-30 MED ORDER — PREDNISONE 5 MG PO TABS
ORAL_TABLET | ORAL | 0 refills | Status: AC
  Filled 2022-07-30: qty 20, 8d supply, fill #0

## 2022-07-30 MED ORDER — SULFASALAZINE 500 MG PO TABS
1000.0000 mg | ORAL_TABLET | Freq: Two times a day (BID) | ORAL | 1 refills | Status: DC
  Filled 2022-07-30: qty 120, 30d supply, fill #0

## 2022-07-30 NOTE — Patient Instructions (Signed)
Sulfasalazine Delayed-Release Tablets Qu es este medicamento? La SULFASALAZINA trata la colitis ulcerativa. Tambin podra usarse para tratar la artritis reumatoide cuando otros medicamentos no han funcionado o no pueden tolerarse. Acta disminuyendo la inflamacin. Pertenece a un grupo de medicamentos llamados salicilatos. Este medicamento puede ser utilizado para otros usos; si tiene alguna pregunta consulte con su proveedor de atencin mdica o con su farmacutico. MARCAS COMUNES: Azulfidine En-Tabs, Sulfazine EC Qu le debo informar a mi profesional de la salud antes de tomar este medicamento? Necesitan saber si usted presenta alguno de los siguientes problemas o situaciones: Asma Trastornos sanguneos o anemia Deficiencia de glucosa-6-fosfato deshidrogenasa (G6DP) Obstruccin intestinal Enfermedad renal Enfermedad heptica [place] Obstruccin del tracto urinario Una reaccin inusual a la sulfasalazina, a las sulfonamidas, a los salicilatos, a otros medicamentos, alimentos, Software engineer o conservantes Si est embarazada o buscando quedar embarazada Si est amamantando a un beb Cmo debo Visual merchandiser medicamento? Tome este medicamento por va oral con un vaso lleno de agua. selo segn las instrucciones en la etiqueta a la [employer]. No corte, triture ni [employer]. Trague las cpsulas enteras. Puede tomarlo con o sin alimentos. Si el Social worker, tmelo con alimentos. Siga usndolo a menos que su equipo de atencin le indique dejar de Media planner. Hable con su equipo de atencin sobre el uso de este medicamento en nios. Puede requerir atencin especial. Aunque este medicamento se puede recetar a nios tan pequeos como de 6 aos de edad con ciertas afecciones, existen precauciones que deben tomarse. Los pacientes de ms de 65 aos de edad pueden presentar reacciones ms fuertes y Pension scheme manager dosis [employer]. Sobredosis: Pngase en  contacto inmediatamente con un centro toxicolgico o una sala de urgencia si usted cree que haya tomado demasiado medicamento. ATENCIN: [employer] es solo para usted. No comparta este medicamento con nadie. Qu sucede si me olvido de una dosis? Si olvida una dosis, adminstrela lo antes posible. Si es casi la hora de la prxima dosis, administre solo esa dosis. No se administre dosis adicionales o dobles. Qu puede interactuar con este medicamento? Digoxina cido flico Puede ser que esta lista no menciona todas las posibles interacciones. Informe a su profesional de [employer] de [employer] productos a base de hierbas, medicamentos de [place] o suplementos nutritivos que est tomando. Si usted fuma, consume bebidas alcohlicas o si utiliza drogas ilegales, indqueselo tambin a su profesional de [employer]. Algunas sustancias pueden interactuar con su medicamento. A qu debo estar atento al usar [employer]PL Corporation? Visite a su equipo de atencin para que revise su evolucin peridicamente. Si los sntomas no comienzan a mejorar o si empeoran, consulte con su equipo de atencin. Necesitar realizarse anlisis de [place] y [place] frecuentes. Este medicamento puede aumentar su sensibilidad al sol. Evite la [employer]. Si no la Network engineer, utilice ropa protectora y crema de Orthoptist. No utilice lmparas solares, camas solares ni cabinas solares. Beba abundante cantidad de agua mientras [place] este medicamento. Informe a su equipo de atencin si ve la tableta en sus heces. Es posible que su cuerpo no est absorbiendo Research scientist (medical). Qu efectos secundarios puedo tener al [employer] este medicamento? Efectos secundarios que debe informar a su equipo de atencin tan pronto como sea posible: Reacciones alrgicas: erupcin cutnea, comezn/picazn, urticaria, hinchazn de la cara, los labios, la lengua o la garganta Anemia aplsica: debilidad o fatiga inusuales, mareos, dolor de cabeza, dificultad  para respirar, aumento del sangrado o moretones Tos [employer]da,  falta de aire o problemas para respirar Inflamacin del msculo cardiaco: debilidad o fatiga inusuales, falta de aire, dolor en el pecho, frecuencia cardaca rpida o irregular, mareos, hinchazn de los tobillos, los pies o las manos. Infeccin: fiebre, escalofros, tos, dolor de garganta, heridas que no sanan, dolor o problemas para Geographical information systems officer, sensacin general de molestia o malestar Lesin en los riones: disminucin en la cantidad de orina, hinchazn de los tobillos, las manos o los pies Lesin en el hgado: Engineer, mining en la regin abdominal superior derecha, prdida de apetito, nuseas, heces de color claro, orina amarilla oscura o marrn, color amarillento de los ojos o la piel, debilidad o fatiga inusuales [place], fiebre y ganglios linfticos inflamados Enrojecimiento, formacin de Chartered loss adjuster, Administrator o distensin de la piel, incluso dentro de la boca Efectos secundarios que generalmente no requieren atencin mdica (debe informarlos a su equipo de atencin si persisten o si son molestos): Saliva, sudoracin u orina que sean de color naranja o amarillo oscuro Mareos Dolor de cabeza Prdida del apetito Engineer, materials estomacal Vmito Puede ser que esta lista no menciona todos los posibles efectos secundarios. Comunquese a su mdico por asesoramiento mdico [employer]lett[employer]ard. Usted puede informar los efectos secundarios a la FDA por telfono al 1-800-FDA-1088. Dnde debo guardar mi medicina? Mantenga fuera del alcance de nios y Neurosurgeon. Guarde a [employer], entre 15 y 30 grados Celsius (59 y 21 grados Fahrenheit). Deseche todo el medicamento que no haya utilizado despus de la fecha de vencimiento. Para desechar los medicamentos que ya no necesite o que estn vencidos: [employer] medicamentos a un programa de recuperacin de medicamentos. Consulte con su farmacia o con una entidad reguladora para encontrar un  lugar donde llevarlo. Si no puede devolver el medicamento, consulte la etiqueta o el folleto de informacin para ver si debe desecharlo en la basura o arrojarlo por el sanitario. Si no est seguro, pregunte a su equipo de atencin. Si es seguro colocarlo en la basura, saque el medicamento del recipiente. Mezcle el medicamento con piedras sanitarias para gatos, tierra, posos (residuos) de caf u otro desperdicio. Coloque la [employer] bolsa o recipiente que quede [place]. Deseche en la basura. ATENCIN: Este folleto es un resumen. Puede ser que no cubra toda la posible informacin. Si usted tiene preguntas acerca de esta medicina, consulte con su mdico, su farmacutico o su profesional de Radiographer, therapeutic.  2023 Elsevier/Gold Standard (2021-09-21 00:00:00)

## 2022-08-02 ENCOUNTER — Encounter: Payer: Self-pay | Admitting: Obstetrics and Gynecology

## 2022-08-02 ENCOUNTER — Other Ambulatory Visit: Payer: Self-pay

## 2022-08-09 ENCOUNTER — Ambulatory Visit (INDEPENDENT_AMBULATORY_CARE_PROVIDER_SITE_OTHER): Payer: Self-pay | Admitting: Obstetrics and Gynecology

## 2022-08-09 ENCOUNTER — Other Ambulatory Visit: Payer: Self-pay

## 2022-08-09 ENCOUNTER — Encounter: Payer: Self-pay | Admitting: Obstetrics and Gynecology

## 2022-08-09 VITALS — BP 114/79 | HR 84

## 2022-08-09 DIAGNOSIS — O0990 Supervision of high risk pregnancy, unspecified, unspecified trimester: Secondary | ICD-10-CM

## 2022-08-09 DIAGNOSIS — M069 Rheumatoid arthritis, unspecified: Secondary | ICD-10-CM

## 2022-08-09 DIAGNOSIS — Z3A35 35 weeks gestation of pregnancy: Secondary | ICD-10-CM

## 2022-08-09 DIAGNOSIS — O09523 Supervision of elderly multigravida, third trimester: Secondary | ICD-10-CM

## 2022-08-09 NOTE — Progress Notes (Signed)
Subjective:  Karina Murphy is a 41 y.o. G3P2002 at [redacted]w[redacted]d being seen today for ongoing prenatal care.  She is currently monitored for the following issues for this high-risk pregnancy and has Obese; Rheumatoid arthritis (HCC); Supervision of high risk pregnancy, antepartum; AMA (advanced maternal age) multigravida 35+; Language barrier; Nausea and vomiting during pregnancy; and High risk medication use on their problem list.  Patient reports  general discomforts of pregnancy .  Contractions: Irritability. Vag. Bleeding: None.  Movement: Present. Denies leaking of fluid.   The following portions of the patient's history were reviewed and updated as appropriate: allergies, current medications, past family history, past medical history, past social history, past surgical history and problem list. Problem list updated.  Objective:   Vitals:   08/09/22 0932  BP: 114/79  Pulse: 84    Fetal Status: Fetal Heart Rate (bpm): 134   Movement: Present     General:  Alert, oriented and cooperative. Patient is in no acute distress.  Skin: Skin is warm and dry. No rash noted.   Cardiovascular: Normal heart rate noted  Respiratory: Normal respiratory effort, no problems with respiration noted  Abdomen: Soft, gravid, appropriate for gestational age. Pain/Pressure: Present     Pelvic:  Cervical exam deferred        Extremities: Normal range of motion.  Edema: Trace  Mental Status: Normal mood and affect. Normal behavior. Normal judgment and thought content.   Urinalysis:      Assessment and Plan:  Pregnancy: G3P2002 at [redacted]w[redacted]d  1. Supervision of high risk pregnancy, antepartum Stable GBS next visit  2. Multigravida of advanced maternal age in third trimester Stable Growth scan scheduled  3. Rheumatoid arthritis, involving unspecified site, unspecified whether rheumatoid factor present [facility]) See Rheumatologist noted. Blood work as per Rheumatologist order drawn today  Preterm labor  symptoms and general obstetric precautions including but not limited to vaginal bleeding, contractions, leaking of fluid and fetal movement were reviewed in detail with the patient. Please refer to After Visit Summary for other counseling recommendations.  Return in about 1 week (around 08/16/2022) for OB visit, face to face, MD only.   Hermina Staggers, MD

## 2022-08-09 NOTE — Patient Instructions (Signed)
Tercer trimestre de embarazo Third Trimester of Pregnancy  El tercer trimestre de embarazo va desde la semana 28 hasta la semana 40. Tambin se dice que va desde el mes 7 hasta el mes 9. En este trimestre, el beb en gestacin (feto) crece muy rpidamente. Hacia el final del noveno mes, el beb en gestacin mide alrededor de 20 pulgadas (45 cm) de largo. Pesa entre 6 y 10 libras (2,70 y 4,50 kg). Cambios en el cuerpo durante el tercer trimestre Su organismo contina atravesando por muchos cambios durante este perodo. Los cambios varan y generalmente vuelven a la normalidad despus del nacimiento del beb. Cambios fsicos Seguir aumentando de peso. Puede ser que aumente entre 25 y 35 libras (11 y 16 kg) hacia el final del embarazo. Si tiene bajo peso, puede aumentar entre 28 y 40 lb (unos 13 a 18 kg). Si tiene sobrepeso, puede aumentar entre 15 y 25 libras (unos 7 a 11 kg). Podrn aparecer las primeras estras en las caderas, el vientre (abdomen) y las mamas. Las mamas seguirn creciendo y pueden doler. Un lquido amarillo (calostro) puede salir de sus pechos. Esta es la primera leche que usted produce para el beb. Tal vez haya cambios en el cabello. El ombligo puede salir hacia afuera. Puede observar que se le hinchan ms las manos, la cara o los tobillos. Cambios en la salud Es posible que tenga acidez estomacal. Es posible que tenga dificultades para defecar (estreimiento). Pueden aparecerle hemorroides. Estas son venas hinchadas en el ano que pueden picar o doler. Puede comenzar a tener venas hinchadas (vrices) en las piernas. Puede presentar ms dolor en la pelvis, la espalda o los muslos. Puede presentar ms hormigueo o entumecimiento en las manos, los brazos y las piernas. La piel de su vientre tambin puede sentirse entumecida. Es posible que sienta falta de aire a medida que el tero se agranda. Otros cambios Es posible que haga pis (orine) con mayor frecuencia. Puede tener ms  problemas para dormir. Puede notar que el beb en gestacin "baja" o se mueve ms hacia bajo, en el vientre. Puede notar ms secrecin proveniente de la vagina. Puede sentir las articulaciones flojas y puede sentir dolor alrededor del hueso plvico. Siga estas instrucciones en su casa: Medicamentos Use los medicamentos de venta libre y los recetados solamente como se lo haya indicado el mdico. Algunos medicamentos no son seguros durante el embarazo. Tome vitaminas prenatales que contengan por lo menos 600 microgramos (mcg) de cido flico. Comida y bebida Consuma comidas saludables que incluyan lo siguiente: Frutas y verduras frescas. Cereales integrales. Buenas fuentes de protenas, como carne, huevos y tofu. Productos lcteos con bajo contenido de grasa. Evite la carne cruda y el jugo, la leche y el queso sin pasteurizar. Estos portan grmenes que pueden provocar dao tanto a usted como al beb. Tome 4 o 5 comidas pequeas en lugar de 3 comidas abundantes al da. Es posible que deba tomar medidas para prevenir o tratar los problemas para defecar: Beber suficiente lquido para mantener el pis (orina) de color amarillo plido. Come alimentos ricos en fibra. Entre ellos, frijoles, cereales integrales y frutas y verduras frescas. Limitar los alimentos con alto contenido de grasa y azcar. Estos incluyen alimentos fritos o dulces. Actividad Haga ejercicios solamente como se lo haya indicado el mdico. Interrumpa la actividad fsica si comienza a tener clicos en el tero. Evite levantar pesos excesivos. No haga ejercicio si hace demasiado calor, hay demasiada humedad o se encuentra en un lugar de mucha   altura (altitud elevada). Si lo desea, puede continuar teniendo relaciones sexuales, a menos que el mdico le indique lo contrario. Alivio del dolor y del malestar Haga pausas con frecuencia y descanse con las piernas levantadas (elevadas) si tiene calambres en las piernas o dolor en la parte  baja de la espalda. Dese baos de asiento con agua tibia para aliviar el dolor o las molestias causadas por las hemorroides. Use una crema para las hemorroides si el mdico la autoriza. Use un sostn que le brinde buen soporte si sus mamas estn sensibles. Si desarrolla venas hinchadas y abultadas en las piernas: Use medias de compresin segn las indicaciones de su mdico. Levante los pies durante 15 minutos, 3 o 4 veces por da. Limite la sal en sus alimentos. Seguridad Hable con el mdico antes de recorrer largas distancias. No se d baos de inmersin en agua caliente, baos turcos ni saunas. Use el cinturn de seguridad en todo momento mientras vaya en auto. Hable con el mdico si alguien le est haciendo dao o gritando mucho. Preparacin para la llegada del beb Para prepararse para la llegada de su beb: Tome clases prenatales. Visite el hospital y recorra el rea de maternidad. Compre un asiento de seguridad orientado hacia atrs para llevar al beb en el automvil. Aprenda cmo instalarlo en el auto. Prepare la habitacin del beb. Saque todas las almohadas y los animales de peluche de la cuna del beb. Instrucciones generales Evite el contacto con las bandejas sanitarias de los gatos y la tierra que estos animales usan. Estos contienen grmenes que pueden daar al beb y causar la prdida del beb ya sea aborto espontneo o muerte fetal. No se haga duchas vaginales ni use tampones. No use tampones ni toallas higinicas perfumadas. No fume ni consuma ningn producto que contenga nicotina o tabaco. Si necesita ayuda para dejar de fumar, consulte al mdico. No beba alcohol. No use medicamentos a base de hierbas, drogas ilegales, ni medicamentos que el mdico no haya autorizado. Las sustancias qumicas de estos productos pueden afectar al beb. Cumpla con todas las visitas de seguimiento. Esto es importante. Dnde buscar ms informacin American Pregnancy Association (Asociacin  Americana del Embarazo): americanpregnancy.org American College of Obstetricians and Gynecologists (Colegio Estadounidense de Obstetras y Gineclogos): www.acog.org Office on Women's Health (Oficina para la Salud de la Mujer): womenshealth.gov/pregnancy Comunquese con un mdico si: Tiene fiebre. Tiene clicos leves o siente presin en la parte baja del vientre. Sufre un dolor persistente en el abdomen. Vomita o hace deposiciones acuosas (diarrea). Advierte lquido con mal olor que proviene de la vagina. Siente dolor al orinar o hace orina con mal olor. Tiene un dolor de cabeza que no desaparece despus de tomar analgsicos. Nota cambios en la visin o ve manchas delante de los ojos. Solicite ayuda de inmediato si: Rompe la bolsa. Tiene contracciones regulares separadas por menos de 5 minutos. Tiene sangrado o pequeas prdidas vaginales. Tiene clicos o dolor muy intensos en el vientre. Tiene dificultad para respirar. Sientes dolor en el pecho. Se desmaya. No ha sentido al beb moverse durante el tiempo que le indic el mdico. Tiene dolor, hinchazn o enrojecimiento nuevos en un brazo o una pierna o se produce un aumento de alguno de estos sntomas. Resumen El tercer trimestre comprende desde la semana 28 hasta la semana 40 (desde el mes 7 hasta el mes 9). Esta es la poca en que el beb en gestacin crece muy rpidamente. Durante este perodo, las molestias pueden aumentar a medida que usted   sube de peso y el beb crece. Preprese para la llegada del beb: asista a las clases prenatales, compre un asiento de seguridad orientado hacia atrs para llevar al beb en auto y prepare la habitacin del beb. Solicite ayuda de inmediato si tiene sangrado por la vagina, siente dolor en el pecho y tiene dificultad para respirar, o si no ha sentido al beb moverse durante el tiempo que le indic el mdico. Esta informacin no tiene como fin reemplazar el consejo del mdico. Asegrese de hacerle al  mdico cualquier pregunta que tenga. Document Revised: 03/12/2020 Document Reviewed: 03/12/2020 Elsevier Patient Education  2023 [employer]  

## 2022-08-09 NOTE — Addendum Note (Signed)
Addended by: Hermina Staggers on: 08/09/2022 10:14 AM   Modules accepted: Orders

## 2022-08-10 LAB — COMPREHENSIVE METABOLIC PANEL
ALT: 5 IU/L (ref 0–32)
AST: 12 IU/L (ref 0–40)
Albumin/Globulin Ratio: 1 — ABNORMAL LOW (ref 1.2–2.2)
Albumin: 3.3 g/dL — ABNORMAL LOW (ref 3.9–4.9)
Alkaline Phosphatase: 157 IU/L — ABNORMAL HIGH (ref 44–121)
BUN/Creatinine Ratio: 10 (ref 9–23)
BUN: 8 mg/dL (ref 6–20)
Bilirubin Total: 0.6 mg/dL (ref 0.0–1.2)
CO2: 23 mmol/L (ref 20–29)
Calcium: 9.5 mg/dL (ref 8.7–10.2)
Chloride: 99 mmol/L (ref 96–106)
Creatinine, Ser: 0.79 mg/dL (ref 0.57–1.00)
Globulin, Total: 3.4 g/dL (ref 1.5–4.5)
Glucose: 67 mg/dL — ABNORMAL LOW (ref 70–99)
Potassium: 3.7 mmol/L (ref 3.5–5.2)
Sodium: 138 mmol/L (ref 134–144)
Total Protein: 6.7 g/dL (ref 6.0–8.5)
eGFR: 98 mL/min/{1.73_m2} (ref >59)

## 2022-08-10 LAB — SEDIMENTATION RATE: Sed Rate: 43 mm/hr — ABNORMAL HIGH (ref 0–32)

## 2022-08-10 LAB — C-REACTIVE PROTEIN: CRP: 44 mg/L — ABNORMAL HIGH (ref 0–10)

## 2022-08-24 ENCOUNTER — Ambulatory Visit (INDEPENDENT_AMBULATORY_CARE_PROVIDER_SITE_OTHER): Payer: Self-pay | Admitting: Obstetrics & Gynecology

## 2022-08-24 ENCOUNTER — Other Ambulatory Visit: Payer: Self-pay

## 2022-08-24 ENCOUNTER — Inpatient Hospital Stay (HOSPITAL_COMMUNITY)
Admission: AD | Admit: 2022-08-24 | Discharge: 2022-08-27 | DRG: 806 | Disposition: A | Discharge status: Home / Self Care | Payer: Medicaid Other | Attending: Family Medicine | Admitting: Family Medicine

## 2022-08-24 ENCOUNTER — Encounter (HOSPITAL_COMMUNITY): Payer: Self-pay | Admitting: Obstetrics and Gynecology

## 2022-08-24 VITALS — BP 115/75 | HR 91 | Wt 164.0 lb

## 2022-08-24 DIAGNOSIS — O99214 Obesity complicating childbirth: Secondary | ICD-10-CM | POA: Diagnosis present

## 2022-08-24 DIAGNOSIS — O4292 Full-term premature rupture of membranes, unspecified as to length of time between rupture and onset of labor: Principal | ICD-10-CM | POA: Diagnosis present

## 2022-08-24 DIAGNOSIS — O09529 Supervision of elderly multigravida, unspecified trimester: Secondary | ICD-10-CM

## 2022-08-24 DIAGNOSIS — O99354 Diseases of the nervous system complicating childbirth: Secondary | ICD-10-CM | POA: Diagnosis present

## 2022-08-24 DIAGNOSIS — Z3A37 37 weeks gestation of pregnancy: Secondary | ICD-10-CM | POA: Diagnosis not present

## 2022-08-24 DIAGNOSIS — O0990 Supervision of high risk pregnancy, unspecified, unspecified trimester: Secondary | ICD-10-CM

## 2022-08-24 DIAGNOSIS — O4290 Premature rupture of membranes, unspecified as to length of time between rupture and onset of labor, unspecified weeks of gestation: Secondary | ICD-10-CM | POA: Insufficient documentation

## 2022-08-24 DIAGNOSIS — M069 Rheumatoid arthritis, unspecified: Secondary | ICD-10-CM | POA: Diagnosis present

## 2022-08-24 DIAGNOSIS — M059 Rheumatoid arthritis with rheumatoid factor, unspecified: Secondary | ICD-10-CM | POA: Diagnosis present

## 2022-08-24 DIAGNOSIS — Z758 Other problems related to medical facilities and other health care: Secondary | ICD-10-CM | POA: Diagnosis present

## 2022-08-24 DIAGNOSIS — Z9049 Acquired absence of other specified parts of digestive tract: Secondary | ICD-10-CM

## 2022-08-24 DIAGNOSIS — Z603 Acculturation difficulty: Secondary | ICD-10-CM | POA: Diagnosis present

## 2022-08-24 DIAGNOSIS — O99824 Streptococcus B carrier state complicating childbirth: Secondary | ICD-10-CM | POA: Diagnosis not present

## 2022-08-24 DIAGNOSIS — Z7982 Long term (current) use of aspirin: Secondary | ICD-10-CM | POA: Diagnosis not present

## 2022-08-24 DIAGNOSIS — O09523 Supervision of elderly multigravida, third trimester: Secondary | ICD-10-CM

## 2022-08-24 DIAGNOSIS — O0993 Supervision of high risk pregnancy, unspecified, third trimester: Secondary | ICD-10-CM

## 2022-08-24 DIAGNOSIS — Z789 Other specified health status: Secondary | ICD-10-CM | POA: Diagnosis present

## 2022-08-24 LAB — CBC
HCT: 37.5 % (ref 36.0–46.0)
Hemoglobin: 12.7 g/dL (ref 12.0–15.0)
MCH: 28.1 pg (ref 26.0–34.0)
MCHC: 33.9 g/dL (ref 30.0–36.0)
MCV: 83 fL (ref 80.0–100.0)
Platelets: 327 10*3/uL (ref 150–400)
RBC: 4.52 MIL/uL (ref 3.87–5.11)
RDW: 15.4 % (ref 11.5–15.5)
WBC: 5.8 10*3/uL (ref 4.0–10.5)
nRBC: 0 % (ref 0.0–0.2)

## 2022-08-24 LAB — POCT FERN TEST: POCT Fern Test: POSITIVE

## 2022-08-24 LAB — AMNISURE RUPTURE OF MEMBRANE (ROM) NOT AT ARMC: Amnisure ROM: POSITIVE

## 2022-08-24 LAB — TYPE AND SCREEN
ABO/RH(D): A POS
Antibody Screen: NEGATIVE

## 2022-08-24 MED ORDER — OXYTOCIN-SODIUM CHLORIDE 30-0.9 UT/500ML-% IV SOLN
2.5000 [IU]/h | INTRAVENOUS | Status: DC
  Administered 2022-08-25: 2.5 [IU]/h via INTRAVENOUS

## 2022-08-24 MED ORDER — OXYCODONE-ACETAMINOPHEN 5-325 MG PO TABS: 2.0000 | ORAL_TABLET | ORAL | Status: DC | PRN

## 2022-08-24 MED ORDER — ACETAMINOPHEN 325 MG PO TABS: 650.0000 mg | ORAL_TABLET | ORAL | Status: DC | PRN

## 2022-08-24 MED ORDER — SOD CITRATE-CITRIC ACID 500-334 MG/5ML PO SOLN: 30.0000 mL | ORAL | Status: DC | PRN

## 2022-08-24 MED ORDER — TERBUTALINE SULFATE 1 MG/ML IJ SOLN: 0.2500 mg | Freq: Once | INTRAMUSCULAR | Status: DC | PRN

## 2022-08-24 MED ORDER — LACTATED RINGERS IV SOLN: 500.0000 mL | INTRAVENOUS | Status: DC | PRN

## 2022-08-24 MED ORDER — OXYTOCIN BOLUS FROM INFUSION
333.0000 mL | Freq: Once | INTRAVENOUS | Status: AC
  Administered 2022-08-25: 333 mL via INTRAVENOUS

## 2022-08-24 MED ORDER — SULFASALAZINE 500 MG PO TABS
1000.0000 mg | ORAL_TABLET | Freq: Two times a day (BID) | ORAL | Status: DC
  Filled 2022-08-24: qty 2

## 2022-08-24 MED ORDER — LACTATED RINGERS IV SOLN: INTRAVENOUS | Status: DC

## 2022-08-24 MED ORDER — OXYCODONE-ACETAMINOPHEN 5-325 MG PO TABS: 1.0000 | ORAL_TABLET | ORAL | Status: DC | PRN

## 2022-08-24 MED ORDER — OXYTOCIN-SODIUM CHLORIDE 30-0.9 UT/500ML-% IV SOLN
1.0000 m[IU]/min | INTRAVENOUS | Status: DC
  Administered 2022-08-24: 2 m[IU]/min via INTRAVENOUS
  Filled 2022-08-24: qty 500

## 2022-08-24 MED ORDER — MISOPROSTOL 25 MCG QUARTER TABLET: 25.0000 ug | ORAL_TABLET | ORAL | Status: DC | PRN

## 2022-08-24 MED ORDER — LIDOCAINE HCL (PF) 1 % IJ SOLN
30.0000 mL | INTRAMUSCULAR | Status: AC | PRN
  Administered 2022-08-25: 30 mL via SUBCUTANEOUS
  Filled 2022-08-24: qty 30

## 2022-08-24 MED ORDER — FENTANYL CITRATE (PF) 100 MCG/2ML IJ SOLN
50.0000 ug | INTRAMUSCULAR | Status: DC | PRN
  Administered 2022-08-25: 100 ug via INTRAVENOUS
  Filled 2022-08-24: qty 2

## 2022-08-24 MED ORDER — ONDANSETRON HCL 4 MG/2ML IJ SOLN: 4.0000 mg | Freq: Four times a day (QID) | INTRAMUSCULAR | Status: DC | PRN

## 2022-08-24 MED ORDER — FLEET ENEMA 7-19 GM/118ML RE ENEM: 1.0000 | ENEMA | RECTAL | Status: DC | PRN

## 2022-08-24 NOTE — MAU Provider Note (Signed)
Event Date/Time   First Provider Initiated Contact with Patient 08/24/22 2030      Event Date/Time   First Provider Initiated Contact with Patient 08/24/22 2030       S: Ms. Karina Murphy is a 41 y.o. G3P2002 at [redacted]w[redacted]d  who presents to MAU today complaining of leaking of fluid since Wednesday.  She states she was seen in the office today and had a vaginal exam. She endorses vaginal bleeding. She endorses contractions. She reports normal fetal movement.    O: BP 117/81 (BP Location: Right Arm)   Pulse 72   Temp 98.5 F (36.9 C) (Oral)   Resp 17   Ht 5\' 3"  (1.6 m)   Wt 74 kg   LMP 11/25/2021   SpO2 98%   BMI 28.91 kg/m  GENERAL: Well-developed, well-nourished female in no acute distress.  HEAD: Normocephalic, atraumatic.  CHEST: Normal effort of breathing, regular heart rate ABDOMEN: Soft, nontender, gravid PELVIC: Normal external female genitalia. Vagina is pink and rugated. Cervix with normal contour, no lesions. Moderate amt milky white bloody streaked mucoid discharge. Scant amt pinkish blood.  Negative pooling. Questionable fluid vs mucous with valsalva. Fern Collected.  Cervical exam:  Dilation: 2.5 Effacement (%): 60 Cervical Position: Posterior Station: -3 Presentation: Vertex Exam by:: [email or web address] RN   Fetal Monitoring: FHT: 145 bpm, Mod Var, -Decels, +Accels Toco: Irregular  Results for orders placed or performed during the hospital encounter of 08/24/22 (from the past 24 hour(s))  Amnisure rupture of membrane (rom)not at [facility]     Status: None   Collection Time: 08/24/22  9:11 PM  Result Value Ref Range   Amnisure ROM POSITIVE      A: SIUP at [redacted]w[redacted]d  Inconclusive    P: -Fern returns inconclusive. -Some ferning noting, but possibly from mucous. -Will perform amnisure for definitive diagnosis.  [redacted]w[redacted]d, CNM 08/24/2022 8:31 PM  Reassessment (9:43 PM) -Amnisure returns positive. -Nurse instructed to notify L&D Team.   14/08/2022  MSN, CNM Advanced Practice Provider, Center for [facility]

## 2022-08-24 NOTE — H&P (Signed)
OBSTETRIC ADMISSION HISTORY AND PHYSICAL  Karina Murphy is a 41 y.o. female G3P2002 with IUP at [redacted]w[redacted]d by 7 week [place] presenting for PROM, with positive amnisure. She was concerned about vaginal bleeding initially, but no ongoing bleeding noted in MAU. She reports +FMs, No LOF, no VB, no blurry vision, headaches or peripheral edema, and RUQ pain.  She plans on breast and bottle feeding. She is undecided about birth control. She received her prenatal care at [facility]  Dating: By 7wk [place] --->  Estimated Date of Delivery: 09/09/22  Sono:    @[redacted]w[redacted]d , CWD, normal anatomy, cephalic presentation, Anterior placenta, 2225g, 31% EFW   Prenatal History/Complications:  Rheumatoid arthritis, on sulfasalazine AMA   Nursing Staff Provider  Office Location  [facility] Dating  LMP  [facility] Model [facility] ] Traditional [ ]  Centering [ ]  Mom-Baby Dyad    Language  Spanish  Anatomy Karina Murphy  Normal with fup scheduled for [facility]  Flu Vaccine  06/14/22 Genetic/Carrier Screen  NIPS: LR    AFP:    [place]:  TDaP Vaccine   06/14/22 Hgb A1C or  GTT Early  Third trimester normal  COVID Vaccine Denied    LAB RESULTS   Rhogam  NA Blood Type A/Positive/-- (06/26 1145)   Baby Feeding Plan Breast/Formula  Antibody Negative (06/26 1145)  Contraception Undecided  Rubella 6.83 (06/26 1145)  Circumcision no RPR Non Reactive (06/26 1145)   Pediatrician  Dr. 05-27-1988  HBsAg Negative (06/26 1145)   Support Person Karina Murphy HCVAb NR  Prenatal Classes  HIV Non Reactive (06/26 1145)     BTL Consent  GBS   (For PCN allergy, check sensitivities)   VBAC Consent  Pap  6/23 ASCUS       DME Rx [ ]  BP cuff [ ]  Weight Scale Waterbirth  [ ]  Class [ ]  Consent [ ]  CNM visit  PHQ9 & GAD7 [  ] new OB [  ] 28 weeks  [  ] 36 weeks Induction  [ ]  Orders Entered [ ] Foley Y/N    Past Medical History: Past Medical History:  Diagnosis Date   Anxiety    Arthritis, rheumatoid (HCC)    Breast pain, left 09/17/2016   Chronic headaches    GERD (gastroesophageal  reflux disease)    Hypertension    Obesity    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   Urticaria    Weakness 11/23/2016    Past Surgical History: Past Surgical History:  Procedure Laterality Date   CHOLECYSTECTOMY  07/2020    Obstetrical History: OB History     Gravida  3   Para  2   Term  2   Preterm  0   AB  0   Living  2      SAB  0   IAB  0   Ectopic  0   Multiple  0   Live Births  2           Social History Social History   Socioeconomic History   Marital status: Single    Spouse name: Not on file   Number of children: 2   Years of education: Not on file   Highest education level: Not on file  Occupational History   Not on file  Tobacco Use   Smoking status: Never    Passive exposure: Never   Smokeless tobacco: Never  Vaping Use   Vaping Use: Never used  Substance and Sexual Activity   Alcohol use: No  Drug use: No   Sexual activity: Not Currently    Birth control/protection: Condom  Other Topics Concern   Not on file  Social History Narrative   Completed 2nd grade.    Social Determinants of Health   Financial Resource Strain: Not on file  Food Insecurity: No Food Insecurity (03/08/2022)   Hunger Vital Sign    Worried About Running Out of Food in the Last Year: Never true    Ran Out of Food in the Last Year: Never true  Transportation Needs: No Transportation Needs (03/08/2022)   PRAPARE - Administrator, Civil Service (Medical): No    Lack of Transportation (Non-Medical): No  Physical Activity: Not on file  Stress: Not on file  Social Connections: Not on file    Family History: Family History  Problem Relation Age of Onset   Diabetes Mother    Hypertension Mother    Diabetes Brother    Stomach cancer Neg Hx    Colon cancer Neg Hx    Esophageal cancer Neg Hx    Pancreatic cancer Neg Hx    Asthma Neg Hx    Cancer Neg Hx    Heart disease Neg Hx    Stroke Neg Hx     Allergies: Allergies  Allergen  Reactions   Pineapple Itching   Morphine Palpitations    Medications Prior to Admission  Medication Sig Dispense Refill Last Dose   aspirin EC 81 MG tablet Take 1 tablet (81 mg total) by mouth daily. Swallow whole. 90 tablet 1 08/23/2022   ferrous sulfate (FERROUSUL) 325 (65 FE) MG tablet Take 1 tablet (325 mg total) by mouth every other day. Take with vitamin c 30 tablet 1 08/24/2022   Prenatal Vit-Fe Fumarate-FA (PREPLUS) 27-1 MG TABS Take 1 tablet by mouth daily. 90 tablet 3 08/24/2022   sulfaSALAzine (AZULFIDINE) 500 MG tablet Take 2 tablets (1,000 mg total) by mouth 2 (two) times daily. Start with 1 tablet 2 times daily for 2 weeks then increase if tolerable. 120 tablet 1 08/24/2022     Review of Systems   All systems reviewed and negative except as stated in HPI  Blood pressure 117/81, pulse 72, temperature 98.5 F (36.9 C), temperature source Oral, resp. rate 17, height 5\' 3"  (1.6 m), weight 74 kg, last menstrual period 11/25/2021, SpO2 98 %. General appearance: alert, cooperative, and appears stated age Lungs: clear to auscultation bilaterally Heart: regular rate and rhythm Abdomen: soft, non-tender; bowel sounds normal Pelvic: deferred Extremities: Homans sign is negative, no sign of DVT Presentation: cephalic Fetal monitoring: 140bpm, moderate variability, +accels., no decels. Uterine activity: contractions every 3-5 mins  Dilation: 2.5 Effacement (%): 60 Station: -3 Exam by:: [email or web address] RN   Prenatal labs: ABO, Rh: A/Positive/-- (06/26 1145) Antibody: Negative (06/26 1145) Rubella: 6.83 (06/26 1145) RPR: Non Reactive (10/02 0839)  HBsAg: Negative (06/26 1145)  HIV: Non Reactive (10/02 0839)  GBS:   pending 2hr Glucola negative Genetic screening Low risk Anatomy 11[birth year]1993 Normal  Prenatal Transfer Tool  Maternal Diabetes: No Genetic Screening: Normal Maternal Ultrasounds/Referrals: Normal Fetal Ultrasounds or other Referrals:  None Maternal Substance Abuse:   No Significant Maternal Medications:  sulfasalazine for rheumatoid arthritis Significant Maternal Lab Results:  GBS unknown, collected Number of Prenatal Visits:Less than or equal to 3 verified prenatal visits Other Comments:  None  Results for orders placed or performed during the hospital encounter of 08/24/22 (from the past 24 hour(s))  Amnisure rupture of membrane (rom)not at [facility]  Collection Time: 08/24/22  9:11 PM  Result Value Ref Range   Amnisure ROM POSITIVE     Patient Active Problem List   Diagnosis Date Noted   High risk medication use 07/30/2022   Nausea and vomiting during pregnancy 06/14/2022   Language barrier 03/31/2022   Supervision of high risk pregnancy, antepartum 02/04/2022   AMA (advanced maternal age) multigravida 35+ 02/04/2022   Rheumatoid arthritis (HCC) 02/12/2014   Obese 12/26/2013    Assessment/Plan:  Jaymarie Yeakel is a 41 y.o. G3P2002 at [redacted]w[redacted]d here for IOL due to possible prolonged ROM. She states she has been leaking fluid and mucus for about 1 week. Noted to be amnisure positive in MAU. Some concerns about vaginal bleeding per patient, but that has resolved.  #Labor:admit for IOL. Given cervical check and frequent contractions, will go ahead an start oxytocin #Pain: IV fentanyl, epidural per request #FWB: Cat 1 #ID:  GBS collected and pending. Plan for PCN is positive. #MOF: Breast and bottle  #MOC:undecided #Circ:  No.  Sheppard Evens MD MPH OB Fellow, Faculty Practice [facility], Center for [facility] 08/24/2022

## 2022-08-24 NOTE — Progress Notes (Addendum)
   PRENATAL VISIT NOTE  Subjective:  Karina Murphy is a 41 y.o. G3P2002 at [redacted]w[redacted]d being seen today for ongoing prenatal care.  She is currently monitored for the following issues for this high-risk pregnancy and has Obese; Rheumatoid arthritis (HCC); Supervision of high risk pregnancy, antepartum; AMA (advanced maternal age) multigravida 35+; Language barrier; Nausea and vomiting during pregnancy; and High risk medication use on their problem list.  Patient reports  mucous discharge .  Contractions: Irritability. Vag. Bleeding: None.  Movement: Present. Denies leaking of fluid.   The following portions of the patient's history were reviewed and updated as appropriate: allergies, current medications, past family history, past medical history, past social history, past surgical history and problem list.   Objective:   Vitals:   08/24/22 1534  BP: 115/75  Pulse: 91  Weight: 164 lb (74.4 kg)    Fetal Status: Fetal Heart Rate (bpm): 142   Movement: Present  Presentation: Vertex  General:  Alert, oriented and cooperative. Patient is in no acute distress.  Skin: Skin is warm and dry. No rash noted.   Cardiovascular: Normal heart rate noted  Respiratory: Normal respiratory effort, no problems with respiration noted  Abdomen: Soft, gravid, appropriate for gestational age.  Pain/Pressure: Present     Pelvic: Cervical exam performed in the presence of a chaperone Dilation: 2.5 Effacement (%): 50 Station: -3  Extremities: Normal range of motion.  Edema: None  Mental Status: Normal mood and affect. Normal behavior. Normal judgment and thought content.   Assessment and Plan:  Pregnancy: G3P2002 at [redacted]w[redacted]d 1. Multigravida of advanced maternal age in third trimester   2. Supervision of high risk pregnancy, antepartum   Term labor symptoms and general obstetric precautions including but not limited to vaginal bleeding, contractions, leaking of fluid and fetal movement were reviewed in  detail with the patient. Please refer to After Visit Summary for other counseling recommendations.   Return in about 1 week (around 08/31/2022).  Future Appointments  Date Time Provider Department Center  08/26/2022  3:30 PM [facility] NURSE [facility]  08/26/2022  3:45 PM [facility] US5 [facility]  09/09/2022  1:15 PM [place] Bing, MD [facility]  09/27/2022  2:20 PM Rice, Jamesetta Orleans, MD [facility] None    Scheryl Darter, MD

## 2022-08-24 NOTE — MAU Note (Signed)
.  Karina Murphy is a 41 y.o. at [redacted]w[redacted]d here in MAU reporting:  Pt reports that Wednesday of last week she noticed some leaking and clear discharge that was soaking her underwear. Pt also reports that she feels baby move but just "slower".  Today she went to her follow up visit and explained to them her concerns. Pt states that she was checked and was 3cm. Pt states that ever since then she noticed that she's been having more than usual bleeding and a couple of dark clots. Pt reports feeling strong lower abdominal pain 6/10.    Pain score: 6/10 lower abdominal ctx and cramping. Vitals:   08/24/22 1948  BP: 117/81  Pulse: 72  Resp: 17  Temp: 98.5 F (36.9 C)  SpO2: 98%     FHT:145 Lab orders placed from triage:

## 2022-08-25 ENCOUNTER — Encounter (HOSPITAL_COMMUNITY): Payer: Self-pay | Admitting: Obstetrics and Gynecology

## 2022-08-25 DIAGNOSIS — Z3A37 37 weeks gestation of pregnancy: Secondary | ICD-10-CM

## 2022-08-25 DIAGNOSIS — O4292 Full-term premature rupture of membranes, unspecified as to length of time between rupture and onset of labor: Secondary | ICD-10-CM

## 2022-08-25 DIAGNOSIS — O99824 Streptococcus B carrier state complicating childbirth: Secondary | ICD-10-CM

## 2022-08-25 LAB — GROUP B STREP BY PCR: Group B strep by PCR: POSITIVE — AB

## 2022-08-25 LAB — RPR: RPR Ser Ql: NONREACTIVE

## 2022-08-25 MED ORDER — FLEET ENEMA 7-19 GM/118ML RE ENEM: 1.0000 | ENEMA | Freq: Every day | RECTAL | Status: DC | PRN

## 2022-08-25 MED ORDER — IBUPROFEN 600 MG PO TABS
600.0000 mg | ORAL_TABLET | Freq: Four times a day (QID) | ORAL | Status: DC
  Administered 2022-08-25 – 2022-08-27 (×9): 600 mg via ORAL
  Filled 2022-08-25 (×9): qty 1

## 2022-08-25 MED ORDER — MISOPROSTOL 200 MCG PO TABS
400.0000 ug | ORAL_TABLET | Freq: Once | ORAL | Status: AC
  Administered 2022-08-25: 400 ug via BUCCAL

## 2022-08-25 MED ORDER — LACTATED RINGERS IV BOLUS
1000.0000 mL | Freq: Once | INTRAVENOUS | Status: AC
  Administered 2022-08-25: 1000 mL via INTRAVENOUS

## 2022-08-25 MED ORDER — BENZOCAINE-MENTHOL 20-0.5 % EX AERO
1.0000 | INHALATION_SPRAY | CUTANEOUS | Status: DC | PRN
  Administered 2022-08-25: 1 via TOPICAL
  Filled 2022-08-25: qty 56

## 2022-08-25 MED ORDER — PIPERACILLIN-TAZOBACTAM 3.375 G IVPB
3.3750 g | Freq: Three times a day (TID) | INTRAVENOUS | Status: AC
  Administered 2022-08-25 – 2022-08-26 (×3): 3.375 g via INTRAVENOUS
  Filled 2022-08-25 (×4): qty 50

## 2022-08-25 MED ORDER — TRANEXAMIC ACID-NACL 1000-0.7 MG/100ML-% IV SOLN
1000.0000 mg | INTRAVENOUS | Status: AC
  Administered 2022-08-25: 1000 mg via INTRAVENOUS

## 2022-08-25 MED ORDER — ACETAMINOPHEN 500 MG PO TABS
1000.0000 mg | ORAL_TABLET | Freq: Once | ORAL | Status: AC
  Administered 2022-08-25: 1000 mg via ORAL
  Filled 2022-08-25: qty 2

## 2022-08-25 MED ORDER — SULFASALAZINE 500 MG PO TABS
500.0000 mg | ORAL_TABLET | Freq: Two times a day (BID) | ORAL | Status: DC
  Administered 2022-08-25: 500 mg via ORAL
  Filled 2022-08-25 (×2): qty 1

## 2022-08-25 MED ORDER — PENICILLIN G POT IN DEXTROSE 60000 UNIT/ML IV SOLN
3.0000 10*6.[IU] | INTRAVENOUS | Status: DC
  Administered 2022-08-25: 3 10*6.[IU] via INTRAVENOUS
  Filled 2022-08-25: qty 50

## 2022-08-25 MED ORDER — MEASLES, MUMPS & RUBELLA VAC IJ SOLR: 0.5000 mL | Freq: Once | INTRAMUSCULAR | Status: DC

## 2022-08-25 MED ORDER — WITCH HAZEL-GLYCERIN EX PADS: 1.0000 | MEDICATED_PAD | CUTANEOUS | Status: DC | PRN

## 2022-08-25 MED ORDER — COCONUT OIL OIL: 1.0000 | TOPICAL_OIL | Status: DC | PRN

## 2022-08-25 MED ORDER — SODIUM CHLORIDE 0.9% FLUSH: 3.0000 mL | Freq: Two times a day (BID) | INTRAVENOUS | Status: DC

## 2022-08-25 MED ORDER — MISOPROSTOL 200 MCG PO TABS: 200.0000 ug | ORAL_TABLET | Freq: Once | ORAL | Status: DC

## 2022-08-25 MED ORDER — MISOPROSTOL 200 MCG PO TABS
ORAL_TABLET | ORAL | Status: AC
  Filled 2022-08-25: qty 1

## 2022-08-25 MED ORDER — SENNOSIDES-DOCUSATE SODIUM 8.6-50 MG PO TABS
2.0000 | ORAL_TABLET | Freq: Every day | ORAL | Status: DC
  Administered 2022-08-26 – 2022-08-27 (×2): 2 via ORAL
  Filled 2022-08-25 (×2): qty 2

## 2022-08-25 MED ORDER — SODIUM CHLORIDE 0.9% FLUSH: 3.0000 mL | INTRAVENOUS | Status: DC | PRN

## 2022-08-25 MED ORDER — ONDANSETRON HCL 4 MG PO TABS: 4.0000 mg | ORAL_TABLET | ORAL | Status: DC | PRN

## 2022-08-25 MED ORDER — DIBUCAINE (PERIANAL) 1 % EX OINT: 1.0000 | TOPICAL_OINTMENT | CUTANEOUS | Status: DC | PRN

## 2022-08-25 MED ORDER — MISOPROSTOL 200 MCG PO TABS
400.0000 ug | ORAL_TABLET | Freq: Once | ORAL | Status: AC
  Administered 2022-08-25: 400 ug via RECTAL

## 2022-08-25 MED ORDER — TETANUS-DIPHTH-ACELL PERTUSSIS 5-2.5-18.5 LF-MCG/0.5 IM SUSY: 0.5000 mL | PREFILLED_SYRINGE | Freq: Once | INTRAMUSCULAR | Status: DC

## 2022-08-25 MED ORDER — ONDANSETRON HCL 4 MG/2ML IJ SOLN: 4.0000 mg | INTRAMUSCULAR | Status: DC | PRN

## 2022-08-25 MED ORDER — SODIUM CHLORIDE 0.9 % IV SOLN: 250.0000 mL | INTRAVENOUS | Status: DC | PRN

## 2022-08-25 MED ORDER — ZOLPIDEM TARTRATE 5 MG PO TABS: 5.0000 mg | ORAL_TABLET | Freq: Every evening | ORAL | Status: DC | PRN

## 2022-08-25 MED ORDER — MISOPROSTOL 200 MCG PO TABS
ORAL_TABLET | ORAL | Status: AC
  Filled 2022-08-25: qty 4

## 2022-08-25 MED ORDER — SODIUM CHLORIDE 0.9 % IV SOLN
5.0000 10*6.[IU] | Freq: Once | INTRAVENOUS | Status: AC
  Administered 2022-08-25: 5 10*6.[IU] via INTRAVENOUS
  Filled 2022-08-25: qty 5

## 2022-08-25 MED ORDER — TRANEXAMIC ACID-NACL 1000-0.7 MG/100ML-% IV SOLN
INTRAVENOUS | Status: AC
  Filled 2022-08-25: qty 100

## 2022-08-25 MED ORDER — SIMETHICONE 80 MG PO CHEW: 80.0000 mg | CHEWABLE_TABLET | ORAL | Status: DC | PRN

## 2022-08-25 MED ORDER — METHYLERGONOVINE MALEATE 0.2 MG/ML IJ SOLN
INTRAMUSCULAR | Status: AC
  Filled 2022-08-25: qty 1

## 2022-08-25 MED ORDER — PRENATAL MULTIVITAMIN CH
1.0000 | ORAL_TABLET | Freq: Every day | ORAL | Status: DC
  Administered 2022-08-25 – 2022-08-27 (×3): 1 via ORAL
  Filled 2022-08-25 (×3): qty 1

## 2022-08-25 MED ORDER — DIPHENHYDRAMINE HCL 25 MG PO CAPS: 25.0000 mg | ORAL_CAPSULE | Freq: Four times a day (QID) | ORAL | Status: DC | PRN

## 2022-08-25 MED ORDER — BISACODYL 10 MG RE SUPP: 10.0000 mg | Freq: Every day | RECTAL | Status: DC | PRN

## 2022-08-25 MED ORDER — ACETAMINOPHEN 325 MG PO TABS
650.0000 mg | ORAL_TABLET | ORAL | Status: DC | PRN
  Administered 2022-08-25 – 2022-08-27 (×5): 650 mg via ORAL
  Filled 2022-08-25 (×5): qty 2

## 2022-08-25 NOTE — Discharge Summary (Signed)
Postpartum Discharge Summary  Date of Service updated***     Patient Name: Karina Murphy DOB: [birth year] MRN: [id number]  Date of admission: 08/24/2022 Delivery date:08/25/2022  Delivering provider: Wells Guiles R  Date of discharge: 08/25/2022  Admitting diagnosis: PROM (premature rupture of membranes) [O42.90] Intrauterine pregnancy: [redacted]w[redacted]d    Secondary diagnosis:  Principal Problem:   PROM (premature rupture of membranes) Active Problems:   [place]   Supervision of high risk pregnancy, antepartum   AMA (advanced maternal age) multigravida 35+   Language barrier  Additional problems: ***    Discharge diagnosis: Term Pregnancy Delivered , uterine atony w/o PPH (given cytotec/TXA)                                            Post partum procedures:{Postpartum procedures:23558} Augmentation: AROM and Pitocin (AROM forebag) Complications: RVOZ>36hours  Hospital course: Induction of Labor With Vaginal Delivery   41y.o. yo G3P2002 at 365w6das admitted to the hospital 08/24/2022 for induction of labor.  Indication for induction:  prolonged PROM .  Patient had an labor course complicated by nothing Membrane Rupture Time/Date:  ,08/18/2022   Delivery Method:Vaginal, Spontaneous  Episiotomy: None  Lacerations:  2nd degree  Details of delivery can be found in separate delivery note.  Patient had a postpartum course complicated by***. Patient is discharged home 08/25/22.  Newborn Data: Birth date:08/25/2022  Birth time:7:27 AM  Gender:Female  Living status:Living  Apgars:8 ,9  Weight:   Magnesium Sulfate received: No BMZ received: No Rhophylac:N/A MMR:N/A T-DaP:Given prenatally Flu: given prenatally Transfusion:{Transfusion received:30440034}  Physical exam  Vitals:   08/25/22 0745 08/25/22 0800 08/25/22 0815 08/25/22 0820  BP: (!) 122/56 (!) 126/57 (!) 132/101 130/63  Pulse: 60 88 (!) 172 (!) 104  Resp:      Temp:   98.4 F (36.9 C)    TempSrc:   Oral   SpO2:      Weight:      Height:       General: {Exam; general:21111117} Lochia: {Desc; appropriate/inappropriate:30686::"appropriate"} Uterine Fundus: {Desc; firm/soft:30687} Incision: {Exam; incision:21111123} DVT Evaluation: {Exam; dvt:2111122} Labs: Lab Results  Component Value Date   WBC 5.8 08/24/2022   HGB 12.7 08/24/2022   HCT 37.5 08/24/2022   MCV 83.0 08/24/2022   PLT 327 08/24/2022      Latest Ref Rng & Units 08/09/2022   10:29 AM  CMP  Glucose 70 - 99 mg/dL 67   BUN 6 - 20 mg/dL 8   Creatinine 0.57 - 1.00 mg/dL 0.79   Sodium 134 - 144 mmol/L 138   Potassium 3.5 - 5.2 mmol/L 3.7   Chloride 96 - 106 mmol/L 99   CO2 20 - 29 mmol/L 23   Calcium 8.7 - 10.2 mg/dL 9.5   Total Protein 6.0 - 8.5 g/dL 6.7   Total Bilirubin 0.0 - 1.2 mg/dL 0.6   Alkaline Phos 44 - 121 IU/L 157   AST 0 - 40 IU/L 12   ALT 0 - 32 IU/L 5    Edinburgh Score:     No data to display           After visit meds:  Allergies as of 08/25/2022       Reactions   Pineapple Itching   Morphine Palpitations     Med Rec must be completed prior to using this [facility]  Discharge home in stable condition Infant Feeding: {Baby feeding:23562} Infant Disposition:{CHL IP OB HOME WITH VVZSMO:70786} Discharge instruction: per After Visit Summary and Postpartum booklet. Activity: Advance as tolerated. Pelvic rest for 6 weeks.  Diet: {OB LJQG:92010071} Future Appointments: Future Appointments  Date Time Provider [place]  08/26/2022  3:30 PM [facility] NURSE [facility]  08/26/2022  3:45 PM [facility] US5 [facility]  09/09/2022  1:15 PM Aletha Halim, MD [facility]  09/27/2022  2:20 PM Rice, Resa Miner, MD [facility] None   Follow up Visit: Roma Schanz, CNM  P [facility] Admin Pool Please schedule this patient for PP visit in: 4-6wks High risk pregnancy complicated by: RA, AMA Delivery mode:  SVD Anticipated Birth Control:  other/unsure PP  Procedures needed: none Schedule Integrated [place] visit: no Provider: Any provider  08/25/2022 Roma Schanz, CNM

## 2022-08-25 NOTE — Lactation Note (Signed)
This note was copied from a baby's chart. Lactation Consultation Note  Patient Name: Karina Murphy Date: 08/25/2022 Reason for consult: L&D Initial assessment Age:39 hours  Spanish interpreter used via video. P2, Baby crying when LC entered room. Assisted with latching baby on L breast.  Baby came off and on nipple. Guided baby back to breast with increased depth. Lactation to follow up on MBU.  Maternal Data Does the patient have breastfeeding experience prior to this delivery?: Yes How long did the patient breastfeed?: 3 mos.  Feeding Mother's Current Feeding Choice: Breast Milk and Formula  LATCH Score Latch: Repeated attempts needed to sustain latch, nipple held in mouth throughout feeding, stimulation needed to elicit sucking reflex.  Audible Swallowing: A few with stimulation  Type of Nipple: Everted at rest and after stimulation  Comfort (Breast/Nipple): Soft / non-tender  Hold (Positioning): Assistance needed to correctly position infant at breast and maintain latch.  LATCH Score: 7   Lactation Tools Discussed/Used    Interventions Interventions: Assisted with latch;Skin to skin;Education  Discharge    Consult Status Consult Status: Follow-up from L&D    Dahlia Byes [facility] 08/25/2022, 8:21 AM

## 2022-08-25 NOTE — Progress Notes (Signed)
Patient ID: Karina Murphy, female   DOB: [birth year], 41 y.o.   MRN: [id number] Karina Murphy is a 41 y.o. G3P2002 at [redacted]w[redacted]d admitted for  prolonged PROM  Subjective: uncomfortable w/ contractions and just got IV pain meds. Not wanting epidural  Objective: BP 115/65 (BP Location: Right Arm)   Pulse (!) 58   Temp 98.2 F (36.8 C) (Oral)   Resp 18   Ht 5\' 3"  (1.6 m)   Wt 74 kg   LMP 11/25/2021   SpO2 100%   BMI 28.91 kg/m  No intake/output data recorded.  FHR baseline 125 bpm, Variability: moderate, Accelerations:present, Decelerations:  Absent Toco: q 1-2 mins   SVE:   5.5/90/-2, vtx AROM forebag, clear fluid  Pitocin @ 10 mu/min, decreased to 65mu/min d/t tachysystole  Labs: Lab Results  Component Value Date   WBC 5.8 08/24/2022   HGB 12.7 08/24/2022   HCT 37.5 08/24/2022   MCV 83.0 08/24/2022   PLT 327 08/24/2022    Assessment / Plan: IOL d/t prolonged PROM x 1wk, making change w/ pitocin, forebag AROM'd  Labor: active Fetal Wellbeing:  Category I Pain Control:  IV pain meds Pre-eclampsia: N/A I/D:  PCN for GBS+ Anticipated MOD: NSVB  14/08/2022 CNM, WHNP-BC 08/25/2022, 6:48 AM

## 2022-08-25 NOTE — Progress Notes (Signed)
I was present during the delivery with Joellyn Haff CNM, ordered pt meals, and assistant with Lactation by Orlan Leavens Spanish Medical Interpreter.

## 2022-08-25 NOTE — Progress Notes (Signed)
Patient ID: Karina Murphy, female   DOB: 22-Nov-1980, 41 y.o.   MRN: [id number] Karina Murphy is a 41 y.o. G3P2002 at [redacted]w[redacted]d admitted for prolonged PROM  Subjective: beginning to get uncomfortable w/ contractions  Objective: BP 115/65 (BP Location: Right Arm)   Pulse (!) 58   Temp 98.2 F (36.8 C) (Oral)   Resp 18   Ht 5\' 3"  (1.6 m)   Wt 74 kg   LMP 11/25/2021   SpO2 100%   BMI 28.91 kg/m  No intake/output data recorded.  FHR baseline 125 bpm, Variability: moderate, Accelerations:present, Decelerations:  Absent Toco: q 2-3 mins   SVE:   Dilation: 3 Effacement (%): 60, 70 Station: -3 Exam by:: [email or web address]  Pitocin @ 10 mu/min  Labs: Lab Results  Component Value Date   WBC 5.8 08/24/2022   HGB 12.7 08/24/2022   HCT 37.5 08/24/2022   MCV 83.0 08/24/2022   PLT 327 08/24/2022    Assessment / Plan: IOL d/t prolonged PROM (possibly 1wk), afebrile, wbc 5.8, on pitocin, starting to get uncomfortable w/ contractions and making some slight cervical change.   Labor: early Fetal Wellbeing:  Category I Pain Control:  labor support without medications Pre-eclampsia: N/A I/D:  PCN for GBS+ Anticipated MOD: NSVB  14/08/2022 CNM, WHNP-BC 08/25/2022, [phone]

## 2022-08-25 NOTE — Lactation Note (Signed)
This note was copied from a baby's chart. Lactation Consultation Note  Patient Name: Karina Murphy [id number] Date: 08/25/2022 Reason for consult: Initial assessment;Early term 37-38.6wks Age:39 hours  P3: Early term infant at 37+6 weeks Feeding preference: Breast/formula  In house Spanish interpreter, [name], used for interpretation.  [name] in room when I arrived; parents requested swaddling assistance.  Demonstrated swaddling; baby fell asleep.  Reviewed breast feeding basics with family.  Due to gestational age, suggested mother feed at least every three hours or sooner if baby shows feeding cues.  Assisted to change a large stool and discussed expectations with voiding/stooling.  Offered to return as needed for latch assistance.  Encouraged lots of STS, breast massage and hand expression.  Family appreciative. Support person and teenage daughter present.   Maternal Data Does the patient have breastfeeding experience prior to this delivery?: Yes How long did the patient breastfeed?: 3 months  Feeding Mother's Current Feeding Choice: Breast Milk and Formula Nipple Type: Regular  LATCH Score                    Lactation Tools Discussed/Used    Interventions Interventions: Breast feeding basics reviewed;Assisted with latch;Skin to skin;Breast massage;Hand express;Breast compression;Position options;Support pillows;Adjust position;Education  Discharge    Consult Status Consult Status: Follow-up Date: 08/26/22 Follow-up type: In-patient    Dora Sims 08/25/2022, 12:23 PM

## 2022-08-26 ENCOUNTER — Ambulatory Visit: Payer: MEDICAID

## 2022-08-26 ENCOUNTER — Other Ambulatory Visit: Payer: Self-pay

## 2022-08-26 MED ORDER — IBUPROFEN 600 MG PO TABS
600.0000 mg | ORAL_TABLET | Freq: Four times a day (QID) | ORAL | 0 refills | Status: DC
  Filled 2022-08-26: qty 30, 8d supply, fill #0

## 2022-08-26 MED ORDER — ACETAMINOPHEN 325 MG PO TABS: 650.0000 mg | ORAL_TABLET | ORAL | Status: DC | PRN

## 2022-08-26 NOTE — Social Work (Signed)
MOB was referred for history of depression/anxiety.  * Referral screened out by Clinical Social Worker because none of the following criteria appear to apply:  ~ History of anxiety/depression during this pregnancy, or of post-partum depression following prior delivery.  ~ Diagnosis of anxiety and/or depression within last 3 years OR * MOB's symptoms currently being treated with medication and/or therapy.  Per chart review MOB was diagnosed prior to December 2020. Per OB records no noted symptoms during this pregnancy.   Please contact the Clinical Social Worker if needs arise, by MOB request, or if MOB scores greater than 9/yes to question 10 on Edinburgh Postpartum Depression Screen.  Wilhelmina Hark, LCSWA Clinical Social Worker [phone] 

## 2022-08-27 ENCOUNTER — Ambulatory Visit: Payer: Self-pay

## 2022-08-27 ENCOUNTER — Other Ambulatory Visit: Payer: Self-pay

## 2022-08-27 MED ORDER — MEDROXYPROGESTERONE ACETATE 150 MG/ML IM SUSP
150.0000 mg | Freq: Once | INTRAMUSCULAR | Status: DC
  Filled 2022-08-27: qty 1

## 2022-08-27 NOTE — Discharge Summary (Signed)
Postpartum Discharge Summary  Date of Service updated 08/27/22      Patient Name: Karina Murphy DOB: 06-06-1981 MRN: [id number]  Date of admission: 08/24/2022 Delivery date:08/25/2022  Delivering provider: Wells Guiles R  Date of discharge: 08/27/2022  Admitting diagnosis: PROM (premature rupture of membranes) [O42.90] Intrauterine pregnancy: [redacted]w[redacted]d    Secondary diagnosis:  Principal Problem:   PROM (premature rupture of membranes) Active Problems:   Rheumatoid arthritis ([place]   Supervision of high risk pregnancy, antepartum   AMA (advanced maternal age) multigravida 35+   Language barrier  Additional problems: GBS PCR positive and treated with PCN.     Discharge diagnosis: Term Pregnancy Delivered and RA on Sulfasalazine                                                Post partum procedures: N/A  Augmentation: AROM and Pitocin Complications: None  Hospital course: Induction of Labor With Vaginal Delivery   41y.o. yo G3P3003 at 365w6das admitted to the hospital 08/24/2022 for induction of labor.  Indication for induction:  Prolonged PROM ~1week .  Patient had an labor course complicated by prolonged AROM  Membrane Rupture Time/Date:  ,08/18/2022   Delivery Method:Vaginal, Spontaneous  Episiotomy: None  Lacerations:  2nd degree  Details of delivery can be found in separate delivery note.  Patient had a postpartum course complicated by n/a . Patient is discharged home 08/27/22.  Newborn Data: Birth date:08/25/2022  Birth time:7:27 AM  Gender:Female  Living status:Living  Apgars:8 ,9  Weight:3150 g   Magnesium Sulfate received: No BMZ received: No Rhophylac:N/A MMR:N/A T-DaP:Given prenatally Flu: Yes given prenatally  Transfusion:No  Physical exam  Vitals:   08/26/22 0430 08/26/22 1602 08/26/22 2210 08/27/22 0528  BP: 104/60 121/71 101/61 108/66  Pulse: 62 60 64 62  Resp: _0 Temp: 98.4 F (36.9 C) 98.1 F (36.7 C) 98 F (36.7 C)  98.2 F (36.8 C)  TempSrc: Oral  Oral   SpO2:  100% 100% 100%  Weight:      Height:       General: alert, cooperative, and no distress Lochia: appropriate Uterine Fundus: firm Incision: N/A DVT Evaluation: No evidence of DVT seen on physical exam. No cords or calf tenderness. No significant calf/ankle edema. Labs: Lab Results  Component Value Date   WBC 5.8 08/24/2022   HGB 12.7 08/24/2022   HCT 37.5 08/24/2022   MCV 83.0 08/24/2022   PLT 327 08/24/2022      Latest Ref Rng & Units 08/09/2022   10:29 AM  CMP  Glucose 70 - 99 mg/dL 67   BUN 6 - 20 mg/dL 8   Creatinine 0.57 - 1.00 mg/dL 0.79   Sodium 134 - 144 mmol/L 138   Potassium 3.5 - 5.2 mmol/L 3.7   Chloride 96 - 106 mmol/L 99   CO2 20 - 29 mmol/L 23   Calcium 8.7 - 10.2 mg/dL 9.5   Total Protein 6.0 - 8.5 g/dL 6.7   Total Bilirubin 0.0 - 1.2 mg/dL 0.6   Alkaline Phos 44 - 121 IU/L 157   AST 0 - 40 IU/L 12   ALT 0 - 32 IU/L 5    Edinburgh Score:    08/26/2022   11:09 AM  Edinburgh Postnatal Depression Scale Screening Tool  I have been able to  laugh and see the funny side of things. 0  I have looked forward with enjoyment to things. 0  I have blamed myself unnecessarily when things went wrong. 0  I have been anxious or worried for no good reason. 2  I have felt scared or panicky for no good reason. 2  Things have been getting on top of me. 1  I have been so unhappy that I have had difficulty sleeping. 0  I have felt sad or miserable. 0  I have been so unhappy that I have been crying. 0  The thought of harming myself has occurred to me. 0  Edinburgh Postnatal Depression Scale Total 5     After visit meds:  Allergies as of 08/27/2022       Reactions   Pineapple Itching   Morphine Palpitations        Medication List     STOP taking these medications    aspirin EC 81 MG tablet   FeroSul 325 (65 FE) MG tablet Generic drug: ferrous sulfate       TAKE these medications    acetaminophen  325 MG tablet Commonly known as: Tylenol Take 2 tablets (650 mg total) by mouth every 4 (four) hours as needed (for pain scale < 4).   ibuprofen 600 MG tablet Commonly known as: ADVIL Take 1 tablet (600 mg total) by mouth every 6 (six) hours.   Prenatal 27-1 MG Tabs Tome 1 tableta por va oral diariamente. (Take 1 tablet by mouth daily.)   sulfaSALAzine 500 MG tablet Commonly known as: AZULFIDINE Take 2 tablets (1,000 mg total) by mouth 2 (two) times daily. Start with 1 tablet 2 times daily for 2 weeks then increase if tolerable.         Discharge home in stable condition Infant Feeding: Bottle and Breast Infant Disposition:home with mother Discharge instruction: per After Visit Summary and Postpartum booklet. Activity: Advance as tolerated. Pelvic rest for 6 weeks.  Diet: routine diet Future Appointments: Future Appointments  Date Time Provider Stella  09/27/2022  2:20 PM Collier Salina, MD [facility] None  09/29/2022  1:15 PM Woodroe Mode, MD [facility]   Follow up Visit:   Please schedule this patient for a In person postpartum visit in 4 weeks with the following provider: Any provider. Additional Postpartum F/U: N/A    High risk pregnancy complicated by:  AMA Delivery mode:  Vaginal, Spontaneous  Anticipated Birth Control:  Unsure Patient counseled on Depo and IUD. Patient states she will have a decision by Postpartum visit.   Message sent to [facility] on 08/27/22 @ 12:56 PM   08/27/2022 Jacquiline Doe, CNM

## 2022-08-27 NOTE — Lactation Note (Signed)
This note was copied from a baby's chart. Lactation Consultation Note  Patient Name: Karina Murphy [id number] Date: 08/27/2022 Reason for consult: Follow-up assessment Age:39 hours  Consult was done in Spanish: LC in to room, parents are expecting discharge. Infant is sleeping upon arrival. Birthing parent is breast and formula feeding. LC provided a manual pump, demonstrate use, care and explained milk storage guidelines. Parents have volume guidelines for bottle-feeding. Discussed normal behavior and patterns after 24h, voids and stools as signs good intake, pumping, clusterfeeding, skin to skin. Talked about milk coming into volume and managing engorgement.   Plan: 1-Feeding on demand or 8-12 times in 24h period. 2-If supplementation is needed, follow provided guidelines and pacing. 3-Encouraged birthing parent rest, hydration and food intake.   Contact LC as needed for feeds/support/concerns/questions. All questions answered at this time. Reviewed LC brochure.     Maternal Data Has patient been taught Hand Expression?: Yes  Feeding Mother's Current Feeding Choice: Breast Milk and Formula  LATCH Score No latch observed during this encounter   Lactation Tools Discussed/Used Tools: Pump;Flanges Flange Size: 24;27 Breast pump type: Manual Pump Education: Milk Storage;Setup, frequency, and cleaning Reason for Pumping: mother's request Pumping frequency: as needed  Interventions Interventions: Breast feeding basics reviewed;Skin to skin;Hand express;Breast massage;Expressed milk;Education;Pace feeding;LC Services brochure;Hand pump  Discharge Discharge Education: Engorgement and breast care;Warning signs for feeding baby Pump: Manual WIC Program: Yes  Consult Status Consult Status: Complete Date: 08/27/22 Follow-up type: Call as needed    Jermiah Soderman A Higuera Ancidey 08/27/2022, 1:35 PM

## 2022-09-02 ENCOUNTER — Telehealth (HOSPITAL_COMMUNITY): Payer: Self-pay | Admitting: *Deleted

## 2022-09-02 NOTE — Telephone Encounter (Signed)
Hospital discharge follow-up call completed with Language [address] Reuel Boom [phone]. Patient reported pain from "my belly button" going into her groin. Stated that she took Ibuprofen and pain resolved. Also reported passing one large clot 5 days ago. None since that time. RN reviewed warning signs to report to OB. Patient denied warning signs at this time. Patient voiced no other questions or concerns regarding her health at this time. EPDS=3. Patient voiced no questions or concerns regarding infant at this time. Patient reports infant sleeps in a bassinet on his back. RN reviewed ABCs of safe sleep. Deforest Hoyles, RN, 09/02/22, [phone]

## 2022-09-09 ENCOUNTER — Encounter: Payer: Self-pay | Admitting: Obstetrics and Gynecology

## 2022-09-26 NOTE — Progress Notes (Signed)
Office Visit Note  Patient: Karina Murphy             Date of Birth: 08/30/1981           MRN: [id number]             PCP: Claiborne Rigg, NP Referring: Claiborne Rigg, NP Visit Date: 09/27/2022   Subjective:  Follow-up (Itchy bumps on right and both legs x 3weeks)   History of Present Illness: Karina Murphy is a 42 y.o. 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 She delivered her baby last month uneventfully. For about 3 weeks notices new skin rashes on her right forearm and on both legs above the knees. This is itchy and bothers her most at night time.    Previous HPI 07/30/22 Karina Murphy is a 42 y.o. female here for seropositive RA. She was previously seeing rheumatology after onset of symptoms around 2014 with hand and foot inflammation initially.  She is also experienced involvement of bilateral shoulder pain and stiffness that started later.  She was on treatment but stopped following up after her rheumatologist left that practice in 2018.  Previous treatments including methotrexate and sulfasalazine and prednisone.  She has pretty much been off any maintenance medication since that time treatments have often consisted of intramuscular steroid injections as needed for disease flareups.  She is getting evaluation now largely due to increased healthcare system exposure with her pregnancy.  Her arthritis symptoms have overall been doing better during this pregnancy compared to before.  She is not taking any medications regularly for pain.  Worst affected area at the moment is pain and swelling in her right foot.   This encounter was conducted with assistance of in person Spanish language interpreter.   Review of Systems  Constitutional:  Positive for fatigue.  HENT:  Negative for mouth sores and mouth dryness.   Eyes:  Positive for dryness.  Respiratory:  Negative for shortness of breath.   Cardiovascular:   Negative for chest pain and palpitations.  Gastrointestinal:  Negative for blood in stool, constipation and diarrhea.  Endocrine: Negative for increased urination.  Genitourinary:  Negative for involuntary urination.  Musculoskeletal:  Positive for joint pain, joint pain, myalgias, morning stiffness and myalgias. Negative for gait problem, joint swelling, muscle weakness and muscle tenderness.  Skin:  Positive for rash. Negative for color change, hair loss and sensitivity to sunlight.  Allergic/Immunologic: Positive for susceptible to infections.  Neurological:  Negative for dizziness and headaches.  Hematological:  Negative for swollen glands.  Psychiatric/Behavioral:  Positive for sleep disturbance. Negative for depressed mood. The patient is not nervous/anxious.     PMFS History:  Patient Active Problem List   Diagnosis Date Noted   PROM (premature rupture of membranes) 08/24/2022   High risk medication use 07/30/2022   Nausea and vomiting during pregnancy 06/14/2022   Language barrier 03/31/2022   Supervision of high risk pregnancy, antepartum 02/04/2022   AMA (advanced maternal age) multigravida 35+ 02/04/2022   Rheumatoid arthritis (HCC) 02/12/2014   Obese 12/26/2013    Past Medical History:  Diagnosis Date   Anxiety    Arthritis, rheumatoid (HCC)    Breast pain, left 09/17/2016   Chronic headaches    GERD (gastroesophageal reflux disease)    Hypertension    Obesity    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    Immunization History  Administered Date(s) Administered   Influenza,inj,Quad PF,6+ Mos 06/28/2014, 09/17/2016, 06/14/2022   Tdap 06/14/2022     Objective: Vital Signs: BP 128/79 (BP Location: Left Arm, Patient Position: Sitting, Cuff Size: Normal)   Pulse 80   Resp 12   Ht 5\' 3"  (1.6 m)   Wt 161 lb (73 kg)   LMP 11/25/2021   Breastfeeding Yes   BMI 28.52 kg/m    Physical Exam Eyes:     Conjunctiva/sclera: Conjunctivae normal.  Cardiovascular:     Rate and Rhythm: Normal rate and regular rhythm.  Pulmonary:     Effort: Pulmonary effort is normal.     Breath sounds: Normal breath sounds.  Lymphadenopathy:     Cervical: No cervical adenopathy.  Skin:    General: Skin is warm and dry.     Findings: Rash present.     Comments: Cluster of small erythematous spots all <79m diameter on right forearm and proximal to knees, with some excoriations, no surrounding induration or swelling  Neurological:     Mental Status: She is alert.  Psychiatric:        Mood and Affect: Mood normal.      Musculoskeletal Exam:  Shoulders full ROM mild left shoulder tenderness with abduction and external rotation, no palpable swelling Elbows full ROM no tenderness or swelling Wrists full ROM no tenderness or swelling Finger swelling at left hand MCP joints, chronic widening on both hands with reducible ulnar deviation and PIP hyperextension Knees full ROM no tenderness or swelling Ankles full ROM no tenderness or swelling Negative MTP squeeze tenderness   CDAI Exam: CDAI Score: 10  Patient Global: 20 mm; Provider Global: 40 mm Swollen: 3 ; Tender: 1  Joint Exam 09/27/2022      Right  Left  Glenohumeral      Tender  MCP 2     Swollen   MCP 3     Swollen   MCP 4     Swollen      Investigation: No additional findings.  Imaging: No results found.  Recent Labs: Lab Results  Component Value Date   WBC 5.8 08/24/2022   HGB 12.7 08/24/2022   PLT 327  08/24/2022   NA 138 08/09/2022   K 3.7 08/09/2022   CL 99 08/09/2022   CO2 23 08/09/2022   GLUCOSE 67 (L) 08/09/2022   BUN 8 08/09/2022   CREATININE 0.79 08/09/2022   BILITOT 0.6 08/09/2022   ALKPHOS 157 (H) 08/09/2022   AST 12 08/09/2022   ALT 5 08/09/2022   PROT 6.7 08/09/2022   ALBUMIN 3.3 (L) 08/09/2022   CALCIUM 9.5 08/09/2022   GFRAA 129 03/19/2020    Speciality Comments: No specialty comments available.  Procedures:  No procedures performed Allergies: Pineapple and Morphine   Assessment / Plan:     Visit Diagnoses: Rheumatoid arthritis, involving unspecified site, unspecified whether  rheumatoid factor present (HCC) - Plan: Sedimentation rate, C-reactive protein  Her joint inflammation appears to be ongoing with several swollen joints may be slightly worse evening compared to previous visit.  Checking sedimentation rate and CRP for disease activity characterization.  Sulfasalazine may be partially helping I think when he had additional treatment.  She is currently breast-feeding not a good candidate for methotrexate.  Recommend addition of TNF inhibitor at first choice would be Cimzia for known safety data.  High risk medication use - Plan: CBC with Differential/Platelet, COMPLETE METABOLIC PANEL WITH GFR, Hepatitis B core antibody, IgM, QuantiFERON-TB Gold Plus  Checking CBC and CMP hepatitis screening and QuantiFERON baseline labs anticipating starting biologic treatment.  Discussed side effects of Cimzia including injection site reactions, severe drug reaction, increased risk of infection, and long-term risk of malignancy including lymphoma and nonmelanoma skin cancer.  Rash and other nonspecific skin eruption - Plan: triamcinolone ointment (KENALOG) 0.5 %  I am not sure if skin rashes are directly related to her inflammatory disease or could be issue more like eczema or skin dryness problem.  Is already doing some moisturizing so probably not much room for improvement  with this.  Recommend trial of adding triamcinolone 0.5% topical to the affected area up to twice daily.  Orders: Orders Placed This Encounter  Procedures   Sedimentation rate   C-reactive protein   CBC with Differential/Platelet   COMPLETE METABOLIC PANEL WITH GFR   Hepatitis B core antibody, IgM   QuantiFERON-TB Gold Plus   Meds ordered this encounter  Medications   triamcinolone ointment (KENALOG) 0.5 %    Sig: Apply 1 Application topically 2 (two) times daily as needed.    Dispense:  30 g    Refill:  0     Follow-Up Instructions: Return in about 3 months (around 12/27/2022) for RA on SSZ/?Cimzia switch f/u 48mos.   Collier Salina, MD  Note - This record has been created using [employer].  Chart creation errors have been sought, but may not always  have been located. Such creation errors do not reflect on  the standard of medical care.

## 2022-09-27 ENCOUNTER — Encounter: Payer: Self-pay | Admitting: Internal Medicine

## 2022-09-27 ENCOUNTER — Ambulatory Visit: Payer: Self-pay | Attending: Internal Medicine | Admitting: Internal Medicine

## 2022-09-27 ENCOUNTER — Other Ambulatory Visit: Payer: Self-pay

## 2022-09-27 VITALS — BP 128/79 | HR 80 | Resp 12 | Ht 63.0 in | Wt 161.0 lb

## 2022-09-27 DIAGNOSIS — M069 Rheumatoid arthritis, unspecified: Secondary | ICD-10-CM

## 2022-09-27 DIAGNOSIS — R21 Rash and other nonspecific skin eruption: Secondary | ICD-10-CM

## 2022-09-27 DIAGNOSIS — Z79899 Other long term (current) drug therapy: Secondary | ICD-10-CM

## 2022-09-27 MED ORDER — TRIAMCINOLONE ACETONIDE 0.5 % EX OINT
1.0000 | TOPICAL_OINTMENT | Freq: Two times a day (BID) | CUTANEOUS | 0 refills | Status: DC | PRN
  Filled 2022-09-27: qty 15, 30d supply, fill #0

## 2022-09-27 NOTE — Patient Instructions (Signed)
Certolizumab Pegol Injection Qu es este medicamento? El CERTOLIZUMAB trata afecciones autoinmunes, tales como psoriasis, artritis y enfermedad de Crohn. Acta desacelerando un sistema inmunolgico hiperactivo. Pertenece a un grupo de medicamentos llamados inhibidores del factor de necrosis tumoral. Es un anticuerpo monoclonal. Este medicamento puede ser utilizado para otros usos; si tiene alguna pregunta consulte con su proveedor de atencin mdica o con su farmacutico. MARCAS COMUNES: Cimzia Qu le debo informar a mi profesional de la salud antes de tomar este medicamento? Necesitan saber si usted presenta alguno de los siguientes problemas o situaciones: Cncer Diabetes Sndrome de Curator Insuficiencia cardiaca Hepatitis B o antecedentes de infeccin por hepatitis B Problemas del sistema inmunolgico Infeccin o antecedentes de infecciones Recuentos sanguneos bajos, tales como baja cantidad de glbulos blancos, plaquetas o glbulos rojos Esclerosis mltiple Recientemente recibi o tiene programado recibir una vacuna Tuberculosis, una prueba en la piel con resultado positivo para tuberculosis o si recientemente estuvo en contacto con alguien que tenga tuberculosis Una reaccin alrgica o inusual al certolizumab, a otros medicamentos, al ltex, al caucho, a alimentos, colorantes o conservantes Si est embarazada o buscando quedar embarazada Si est amamantando a un beb Cmo debo Insurance account manager medicamento? Este medicamento se inyecta debajo de la piel. Por lo general lo administra su equipo de atencin en un hospital o en un entorno clnico. Tambin puede administrarse en su casa. Si recibe Coca[employer]a en su casa, le ensearn cmo prepararlo y administrarlo. Use el medicamento exactamente como se le indique. Use el medicamento segn las instrucciones en la etiqueta a la misma hora todos Sena. Siga usndolo a menos que su equipo de atencin le indique dejar de  Nature conservation officer. Es importante que deseche las agujas y las jeringas usadas en un recipiente resistente a los pinchazos. No las deseche en la basura. Si no tiene un recipiente resistente a los pinchazos, llame a su farmacutico o a su equipo de atencin para obtenerlo. Su farmacutico le dar una Gua del medicamento especial (MedGuide, nombre en ingls) con cada receta y en cada ocasin que la vuelva a surtir. Asegrese de leer esta informacin cada vez cuidadosamente. Hable con su equipo de atencin sobre el uso de este medicamento en nios. Puede requerir atencin especial. Sobredosis: Pngase en contacto inmediatamente con un centro toxicolgico o una sala de urgencia si usted cree que haya tomado demasiado medicamento. ATENCIN: [employer] es solo para usted. No comparta este medicamento con nadie. Qu sucede si me olvido de una dosis? Si recibe Coca[employer]a en un hospital o una clnica: es importante que no omita una dosis. Llame a su equipo de atencin si no puede asistir a una cita. Si se administra usted mismo el medicamento en su casa: si se olvida una dosis, adminstrela lo antes posible. Si es casi la hora de la prxima dosis, administre solo esa dosis. No se administre dosis adicionales o dobles. Llame a su equipo de atencin si tiene alguna pregunta. Qu puede interactuar con este medicamento? No use este medicamento con ninguno de los siguientes productos: Medicamentos biolgicos, tales como abatacept, adalimumab, anakinra, etanercept, golimumab, infliximab, natalizumab, rituximab, secukinumab, tocilizumab, ustekinumab Vacunas vivas Tofacitinib Puede ser que esta lista no menciona todas las posibles interacciones. Informe a su profesional de [employer]	[place] de [employer] productos a base de hierbas, medicamentos de [place] o suplementos nutritivos que est tomando. Si usted fuma, consume bebidas alcohlicas o si utiliza drogas ilegales, indqueselo tambin a su profesional de [employer]	[place].  Algunas sustancias pueden interactuar con su  medicamento. A qu debo estar atento al usar Coca[employer]a? Visite a su equipo de atencin para que revise su evolucin peridicamente. Si los sntomas no comienzan a mejorar o si empeoran, informe a su equipo de atencin. Se supervisar su estado de salud atentamente mientras reciba este medicamento. Le realizarn una prueba de tuberculosis (TB) antes de comenzar a recibir Coca[employer]a. Si su equipo de Sports administrator medicamento para la TB, debe usarlo primero, antes de Medical laboratory scientific officer a Solicitor. Asegrese de terminar todo el ciclo del medicamento para la TB. Este medicamento puede aumentar su riesgo de contraer una infeccin. Llame para pedir consejo a su equipo de atencin si tiene fiebre, escalofros, dolor de garganta o cualquier otro sntoma de resfriado o gripe. No se trate usted mismo. Trate de no acercarse a personas que estn enfermas. Hable con su equipo de atencin sobre su riesgo de cncer. Usted podra tener mayor riesgo de desarrollar ciertos tipos de cncer si [place] este medicamento. Qu efectos secundarios puedo tener al [employer] este medicamento? Efectos secundarios que debe informar a su equipo de atencin tan pronto como sea posible: Reacciones alrgicas: erupcin cutnea, comezn/picazn, urticaria, hinchazn de la cara, los labios, la lengua o la garganta Anemia aplsica: debilidad o fatiga inusuales, mareos, dolor de cabeza, dificultad para Ambulance person, aumento del sangrado o Mining engineer, hormigueo o entumecimiento Insuficiencia cardiaca: falta de aire, hinchazn de los tobillos, los pies o las manos, aumento de peso repentino, debilidad o fatiga inusuales Infeccin: fiebre, escalofros, tos, dolor de garganta, heridas que no sanan, dolor o problemas para Garment/textile technologist, sensacin general de molestia o [employer] similar al lupus: dolor, hinchazn o rigidez de las articulaciones, erupcin cutnea en  forma de mariposa en la cara, erupciones cutneas que empeoran en el sol, fiebre, debilidad o fatiga inusuales Convulsiones Sangrado o moretones inusuales Efectos secundarios que generalmente no requieren atencin mdica (debe informarlos a su equipo de atencin si persisten o si son molestos): Social research officer, government de espalda Tos Fatiga Fiebre Dolor de Special educational needs teacher, enrojecimiento o Actor de la inyeccin Dolor de garganta Puede ser que esta lista no menciona todos los posibles efectos secundarios. Comunquese a su mdico por asesoramiento mdico [employer] Usted puede informar los efectos secundarios a la FDA por telfono al 1-800-FDA-1088. Dnde debo guardar mi medicina? Mantenga fuera del alcance de nios y Copy. Gurdelo en el refrigerador. No congele. Mantenga este medicamento en el envase original hasta que est listo para usarlo. Proteja de Naval architect. Deseche todo el medicamento que no haya utilizado despus de la fecha de vencimiento. Para desechar los medicamentos que ya no necesite o que estn vencidos: Lleve el medicamento a un programa de recuperacin de medicamentos. Consulte con su farmacia o con una entidad reguladora para encontrar un lugar donde llevarlo. Si no puede Hydrologist, pregntele a Midwife o a su equipo de atencin cmo desecharlo de Environmental manager. ATENCIN: Este folleto es un resumen. Puede ser que no cubra toda la posible informacin. Si usted tiene preguntas acerca de esta medicina, consulte con su mdico, su farmacutico o su profesional de Technical sales engineer.  2023 Elsevier/Gold Standard (2022-01-27 00:00:00)

## 2022-09-28 ENCOUNTER — Telehealth: Payer: Self-pay | Admitting: Pharmacist

## 2022-09-28 NOTE — Telephone Encounter (Addendum)
Patient is currently uninsured and in the process of re-applying for [employer] . If approved, patient may be candidate for in-office injections (email sent to Annette/Tisha for confirmation)  I have emailed Cimzia drug rep to determine if there is Cimzia patient assistance program for uninsured patients.  I spoke with Dr. Benjamine Mola and if Magnus Sinning is not an option, then Humira would be second option  Knox Saliva, PharmD, MPH, BCPS, CPP Clinical Pharmacist (Rheumatology and Pulmonology)  ----- Message from Shona Needles, RT sent at 09/27/2022  2:54 PM EST ----- Regarding: NEW START CIMZIA

## 2022-09-29 ENCOUNTER — Encounter: Payer: Self-pay | Admitting: Obstetrics & Gynecology

## 2022-09-29 ENCOUNTER — Other Ambulatory Visit: Payer: Self-pay

## 2022-09-29 ENCOUNTER — Ambulatory Visit (INDEPENDENT_AMBULATORY_CARE_PROVIDER_SITE_OTHER): Payer: Self-pay | Admitting: Obstetrics & Gynecology

## 2022-09-29 VITALS — BP 106/73 | HR 85 | Wt 160.8 lb

## 2022-09-29 DIAGNOSIS — M069 Rheumatoid arthritis, unspecified: Secondary | ICD-10-CM

## 2022-09-29 DIAGNOSIS — Z79899 Other long term (current) drug therapy: Secondary | ICD-10-CM

## 2022-09-29 MED ORDER — NORETHINDRONE 0.35 MG PO TABS
1.0000 | ORAL_TABLET | Freq: Every day | ORAL | 11 refills | Status: DC
  Filled 2022-09-29: qty 28, 28d supply, fill #0

## 2022-09-29 NOTE — Progress Notes (Signed)
[place] Partum Visit Note  Karina Murphy is a 42 y.o. [id number] female who presents for a postpartum visit. She is 5 weeks postpartum following a normal spontaneous vaginal delivery.  I have fully reviewed the prenatal and intrapartum course. The delivery was at 37 gestational weeks and 6 days.  Anesthesia: none. Postpartum course has been good. Baby is doing well. Baby is feeding by both breast and bottle - Similac Sensitive RS. Bleeding staining only. Bowel function is normal. Bladder function is normal. Patient is not sexually active. Contraception method is none. Postpartum depression screening: negative.   The pregnancy intention screening data noted above was reviewed. Potential methods of contraception were discussed. The patient elected to proceed with No data recorded.    Health Maintenance Due  Topic Date Due   COVID-19 Vaccine (1) Never done    The following portions of the patient's history were reviewed and updated as appropriate: allergies, current medications, past family history, past medical history, past social history, past surgical history, and problem list.  Review of Systems Pertinent items are noted in HPI.  Objective:  LMP 11/25/2021    General:  alert, cooperative, and no distress   Breasts:  not indicated  Lungs:   Heart:  regular rate and rhythm  Abdomen: soft, non-tender; bowel sounds normal; no masses,  no organomegaly   Wound   GU exam:  not indicated       Assessment:    There are no diagnoses linked to this encounter.  Normal  postpartum exam.  History of ongoing treatment with high-risk medication - Plan: CBC w/Diff, Comp Met (CMET), Sedimentation rate, QuantiFERON-TB Gold Plus, Hepatitis B Core Antibody, IgM, C-reactive protein  Rheumatoid arthritis, involving unspecified site, unspecified whether rheumatoid factor present ([place]) - Plan: CBC w/Diff, Comp Met (CMET), Sedimentation rate, QuantiFERON-TB Gold Plus, Hepatitis B Core Antibody,  IgM, C-reactive protein  Postpartum care following vaginal delivery Labs per Dr. Asencion Noble Plan:   Essential components of care per ACOG recommendations:  1.  Mood and well being: Patient with negative depression screening today. Reviewed local resources for support.  - Patient tobacco use? No.   - hx of drug use? No.    2. Infant care and feeding:  -Patient currently breastmilk feeding? Yes   -Social determinants of health (SDOH) reviewed in EPIC. No concerns  3. Sexuality, contraception and birth spacing - Patient does not want a pregnancy in the next year.  Desired family size is 3 children.  - Reviewed reproductive life planning. Reviewed contraceptive methods based on pt preferences and effectiveness.  Patient desired IUD or IUS and Oral Contraceptive today.   - Discussed birth spacing of 18 months  4. Sleep and fatigue -Encouraged family/partner/community support of 4 hrs of uninterrupted sleep to help with mood and fatigue  5. Physical Recovery  - Discussed patients delivery and complications. She describes her labor as good. - Patient had a Vaginal, no problems at delivery. Patient had a 2nd degree laceration. Perineal healing reviewed. Patient expressed understanding - Patient has urinary incontinence? No. - Patient is safe to resume physical and sexual activity  6.  Health Maintenance - HM due items addressed Yes - Last pap smear  Diagnosis  Date Value Ref Range Status  03/08/2022 (A)  Final   - Atypical squamous cells of undetermined significance (ASC-US)   Pap smear not done at today's visit. Neg HR HPV, f/u 2 years -Breast Cancer screening indicated? Yes   7. Chronic Disease/Pregnancy Condition follow up:  RA  - PCP follow up  Woodroe Mode, MD  [facility] [place], [place]

## 2022-09-30 ENCOUNTER — Other Ambulatory Visit: Payer: Self-pay

## 2022-10-01 LAB — CBC WITH DIFFERENTIAL/PLATELET
Basophils Absolute: 0 10*3/uL (ref 0.0–0.2)
Basos: 1 %
EOS (ABSOLUTE): 0.1 10*3/uL (ref 0.0–0.4)
Eos: 3 %
Hematocrit: 34.1 % (ref 34.0–46.6)
Hemoglobin: 10.9 g/dL — ABNORMAL LOW (ref 11.1–15.9)
Immature Grans (Abs): 0 10*3/uL (ref 0.0–0.1)
Immature Granulocytes: 0 %
Lymphocytes Absolute: 1.2 10*3/uL (ref 0.7–3.1)
Lymphs: 26 %
MCH: 27.4 pg (ref 26.6–33.0)
MCHC: 32 g/dL (ref 31.5–35.7)
MCV: 86 fL (ref 79–97)
Monocytes Absolute: 0.5 10*3/uL (ref 0.1–0.9)
Monocytes: 10 %
Neutrophils Absolute: 2.7 10*3/uL (ref 1.4–7.0)
Neutrophils: 60 %
Platelets: 279 10*3/uL (ref 150–450)
RBC: 3.98 x10E6/uL (ref 3.77–5.28)
RDW: 15.9 % — ABNORMAL HIGH (ref 11.7–15.4)
WBC: 4.4 10*3/uL (ref 3.4–10.8)

## 2022-10-01 LAB — COMPREHENSIVE METABOLIC PANEL
ALT: 12 IU/L (ref 0–32)
AST: 18 IU/L (ref 0–40)
Albumin/Globulin Ratio: 1.1 — ABNORMAL LOW (ref 1.2–2.2)
Albumin: 4 g/dL (ref 3.9–4.9)
Alkaline Phosphatase: 87 IU/L (ref 44–121)
BUN/Creatinine Ratio: 11 (ref 9–23)
BUN: 11 mg/dL (ref 6–20)
Bilirubin Total: 0.6 mg/dL (ref 0.0–1.2)
CO2: 25 mmol/L (ref 20–29)
Calcium: 9.6 mg/dL (ref 8.7–10.2)
Chloride: 101 mmol/L (ref 96–106)
Creatinine, Ser: 0.97 mg/dL (ref 0.57–1.00)
Globulin, Total: 3.5 g/dL (ref 1.5–4.5)
Glucose: 99 mg/dL (ref 70–99)
Potassium: 4 mmol/L (ref 3.5–5.2)
Sodium: 139 mmol/L (ref 134–144)
Total Protein: 7.5 g/dL (ref 6.0–8.5)
eGFR: 76 mL/min/{1.73_m2} (ref >59)

## 2022-10-01 LAB — QUANTIFERON-TB GOLD PLUS
QuantiFERON Mitogen Value: 7.75 IU/mL
QuantiFERON Nil Value: 0.04 IU/mL
QuantiFERON TB1 Ag Value: 0.08 IU/mL
QuantiFERON TB2 Ag Value: 0.1 IU/mL
QuantiFERON-TB Gold Plus: NEGATIVE

## 2022-10-01 LAB — HEPATITIS B CORE ANTIBODY, IGM: Hep B C IgM: NEGATIVE

## 2022-10-01 LAB — SEDIMENTATION RATE: Sed Rate: 61 mm/hr — ABNORMAL HIGH (ref 0–32)

## 2022-10-01 LAB — C-REACTIVE PROTEIN: CRP: 5 mg/L (ref 0–10)

## 2022-10-11 NOTE — Telephone Encounter (Signed)
Called patient using interpreter service (interpreter # [phone], Sol Blazing). Patient to stop by to clinic tomorrow to complete Cimzia patient assistance applicaiton  Cimzia [employer] Cares pt assistance application will need to be completed. Provider portion placed in Dr. Marveen Reeks folder for signature.  Knox Saliva, PharmD, MPH, BCPS, CPP Clinical Pharmacist (Rheumatology and Pulmonology)

## 2022-10-12 ENCOUNTER — Ambulatory Visit: Payer: Self-pay | Attending: Internal Medicine | Admitting: Pharmacist

## 2022-10-12 DIAGNOSIS — Z79899 Other long term (current) drug therapy: Secondary | ICD-10-CM

## 2022-10-12 DIAGNOSIS — M069 Rheumatoid arthritis, unspecified: Secondary | ICD-10-CM

## 2022-10-12 MED ORDER — CIMZIA STARTER KIT 6 X 200 MG/ML ~~LOC~~ PSKT: PREFILLED_SYRINGE | SUBCUTANEOUS | 0 refills | Status: DC

## 2022-10-12 MED ORDER — CIMZIA 2 X 200 MG/ML ~~LOC~~ PSKT: 400.0000 mg | PREFILLED_SYRINGE | SUBCUTANEOUS | 2 refills | Status: DC

## 2022-10-12 NOTE — Telephone Encounter (Signed)
Patient completed her portion of patient assistance application. Pending provider signature  Knox Saliva, PharmD, MPH, BCPS, CPP Clinical Pharmacist (Rheumatology and Pulmonology)

## 2022-10-12 NOTE — Telephone Encounter (Signed)
Submitted Patient Assistance Application to  [employer] Cares  for [facility] along with provider portion, patient portion, med list, letter of attestation, and income documents. Will update patient when we receive a response.  Fax# [phone] Phone# [phone]  Knox Saliva, PharmD, MPH, BCPS, CPP Clinical Pharmacist (Rheumatology and Pulmonology)

## 2022-10-12 NOTE — Progress Notes (Signed)
Pharmacy Note  Subjective: Patient presents today to [facility] to complete Cimzia patient assistance application as she is uninsured.  Diagnosis of heart failure: No  Objective:  CBC    Component Value Date/Time   WBC 4.4 09/29/2022 1416   WBC 5.8 08/24/2022 2228   RBC 3.98 09/29/2022 1416   RBC 4.52 08/24/2022 2228   HGB 10.9 (L) 09/29/2022 1416   HCT 34.1 09/29/2022 1416   PLT 279 09/29/2022 1416   MCV 86 09/29/2022 1416   MCH 27.4 09/29/2022 1416   MCH 28.1 08/24/2022 2228   MCHC 32.0 09/29/2022 1416   MCHC 33.9 08/24/2022 2228   RDW 15.9 (H) 09/29/2022 1416   LYMPHSABS 1.2 09/29/2022 1416   MONOABS 0.5 11/02/2021 2107   EOSABS 0.1 09/29/2022 1416   BASOSABS 0.0 09/29/2022 1416    CMP     Component Value Date/Time   NA 139 09/29/2022 1416   K 4.0 09/29/2022 1416   CL 101 09/29/2022 1416   CO2 25 09/29/2022 1416   GLUCOSE 99 09/29/2022 1416   GLUCOSE 104 (H) 12/01/2021 1229   BUN 11 09/29/2022 1416   CREATININE 0.97 09/29/2022 1416   CREATININE 0.81 11/19/2016 1648   CALCIUM 9.6 09/29/2022 1416   PROT 7.5 09/29/2022 1416   ALBUMIN 4.0 09/29/2022 1416   AST 18 09/29/2022 1416   ALT 12 09/29/2022 1416   ALKPHOS 87 09/29/2022 1416   BILITOT 0.6 09/29/2022 1416   GFRNONAA >60 11/02/2021 2107   GFRNONAA >89 11/19/2016 1648   GFRAA 129 03/19/2020 1659   GFRAA >89 11/19/2016 1648     Baseline Immunosuppressant Therapy Labs TB GOLD    Latest Ref Rng & Units 09/29/2022    2:16 PM  Quantiferon TB Gold  Quantiferon TB Gold Plus Negative Negative    Hepatitis Panel    Latest Ref Rng & Units 09/29/2022    2:16 PM  Hepatitis  Hep B IgM Negative Negative    HIV Lab Results  Component Value Date   HIV Non Reactive 06/14/2022   HIV Non Reactive 03/08/2022   HIV NONREACTIVE 07/26/2016   Immunoglobulins   SPEP    Latest Ref Rng & Units 09/29/2022    2:16 PM  Serum Protein Electrophoresis  Total Protein 6.0 - 8.5 g/dL 7.5    G6PD No  results found for: "G6PDH" TPMT No results found for: "TPMT"   Chest x-ray: 12/13/2020 - No active cardiopulmonary disease.  Does patient have diagnosis of heart failure?  No  Assessment/Plan:  Counseled patient that Cimzia is a TNF blocking agent.  Counseled patient on purpose, proper use, and adverse effects of Cimzia.  Reviewed the most common adverse effects including infections, headache, and injection site reactions. Discussed that there is the possibility of an increased risk of malignancy including non-melanoma skin cancer but it is not well understood if this increased risk is due to the medication or the disease state.  Advised patient to get yearly dermatology exams due to risk of skin cancer.  Counseled patient that Cimzia should be held prior to scheduled surgery.  Counseled patient to avoid live vaccines while on Cimzia.  Recommend annual influenza, PCV 15 or PCV20 or Pneumovax 23, and Shingrix as indicated.   Reviewed the importance of regular labs while on Cimzia therapy. Will monitor CBC and CMP 1 month after starting and then every 3 months routinely thereafter. Patient voiced understanding.  Patient consented to Cimzia.  Reviewed storage instructions for Cimzia.   Advised initial  injection must be administered in office.    Dose will be Cimzia 400 mg at weeks 0,2,4 then Cimzia 400 mg every 28 days  Prescription patient assistance approval  Knox Saliva, PharmD, MPH, BCPS, CPP Clinical Pharmacist (Rheumatology and Pulmonology)

## 2022-10-22 NOTE — Telephone Encounter (Signed)
Called [employer] for update on patient's [employer] patient assistance application for Cimzia. Left VM with program as hold time too long and was transferred to VM box. Requested fax w update or otherwise return call to clinic.  Phone: [phone]  Knox Saliva, PharmD, MPH, BCPS, CPP Clinical Pharmacist (Rheumatology and Pulmonology)

## 2022-10-27 NOTE — Telephone Encounter (Signed)
LVM for patient via an interpreter to schedule an appointment for first dose of Cimzia in office.  Maryan Puls, PharmD PGY-1 [facility] Pharmacy Resident

## 2022-10-27 NOTE — Telephone Encounter (Signed)
Spoke with [employer] patient assistance rep. Cimzia patient assistance has been approved through 10/21/2024. Patient rep will fax over the approval letter to the office.   The medication was dispensed on 10/25/2022 [place]   Pharmacy Information: Phone: [phone]  Maryan Puls, PharmD PGY-1 [facility] Pharmacy Resident

## 2022-10-29 NOTE — Patient Instructions (Signed)
Your next CIMZIA dose is due on 11/15/2022, 11/29/2022, and every 4 weeks thereafter (starting on 12/27/2022). It will always be two injections - use different injection sites  CONTINUE sulfasalazine 1032m twice daily  HOLD CIMZIA if you have signs or symptoms of an infection. You can resume once you feel better or back to your baseline. HOLD CIMZIA if you start antibiotics to treat an infection. HOLD CIMZIA around the time of surgery/procedures. Your surgeon will be able to provide recommendations on when to hold BEFORE and when you are cleared to [place]  Pharmacy information: Your prescription will be shipped from [facility] Their phone number is [phone] call to schedule shipment and confirm address. They will mail your medication to your home.  Labs are due in 1 month then every 3 months. Lab hours are from Monday to Thursday 8am-12:30pm and 1pm-5pm and Friday 8am-12pm. You do not need an appointment if you come for labs during these times.  How to manage an injection site reaction: Remember the 5 C's: COUNTER - leave on the counter at least 30 minutes but up to overnight to bring medication to room temperature. This may help prevent stinging COLD - place something cold (like an ice gel pack or cold water bottle) on the injection site just before cleansing with alcohol. This may help reduce pain CLARITIN - use Claritin (generic name is loratadine) for the first two weeks of treatment or the day of, the day before, and the day after injecting. This will help to minimize injection site reactions CORTISONE CREAM - apply if injection site is irritated and itching CALL ME - if injection site reaction is bigger than the size of your fist, looks infected, blisters, or if you develop hives

## 2022-10-29 NOTE — Progress Notes (Unsigned)
Pharmacy Note  Subjective:   Patient presents to clinic today to receive first dose of CIMZIA for rheumatoid arthritis. She is accompanied by her friend who will be helping to administer injections. Patient currently takes sulfasalazine 1079m twice daily. She is currently lactating.  Patient running a fever or have signs/symptoms of infection? No  Patient currently on antibiotics for the treatment of infection? No  Patient have any upcoming invasive procedures/surgeries? No  Objective: CMP     Component Value Date/Time   NA 139 09/29/2022 1416   K 4.0 09/29/2022 1416   CL 101 09/29/2022 1416   CO2 25 09/29/2022 1416   GLUCOSE 99 09/29/2022 1416   GLUCOSE 104 (H) 12/01/2021 1229   BUN 11 09/29/2022 1416   CREATININE 0.97 09/29/2022 1416   CREATININE 0.81 11/19/2016 1648   CALCIUM 9.6 09/29/2022 1416   PROT 7.5 09/29/2022 1416   ALBUMIN 4.0 09/29/2022 1416   AST 18 09/29/2022 1416   ALT 12 09/29/2022 1416   ALKPHOS 87 09/29/2022 1416   BILITOT 0.6 09/29/2022 1416   GFRNONAA >60 11/02/2021 2107   GFRNONAA >89 11/19/2016 1648   GFRAA 129 03/19/2020 1659   GFRAA >89 11/19/2016 1648    CBC    Component Value Date/Time   WBC 4.4 09/29/2022 1416   WBC 5.8 08/24/2022 2228   RBC 3.98 09/29/2022 1416   RBC 4.52 08/24/2022 2228   HGB 10.9 (L) 09/29/2022 1416   HCT 34.1 09/29/2022 1416   PLT 279 09/29/2022 1416   MCV 86 09/29/2022 1416   MCH 27.4 09/29/2022 1416   MCH 28.1 08/24/2022 2228   MCHC 32.0 09/29/2022 1416   MCHC 33.9 08/24/2022 2228   RDW 15.9 (H) 09/29/2022 1416   LYMPHSABS 1.2 09/29/2022 1416   MONOABS 0.5 11/02/2021 2107   EOSABS 0.1 09/29/2022 1416   BASOSABS 0.0 09/29/2022 1416    Baseline Immunosuppressant Therapy Labs TB GOLD    Latest Ref Rng & Units 09/29/2022    2:16 PM  Quantiferon TB Gold  Quantiferon TB Gold Plus Negative Negative    Hepatitis Panel    Latest Ref Rng & Units 09/29/2022    2:16 PM  Hepatitis  Hep B IgM Negative Negative    Hepatitis C antibody - negative on 03/20/2023 Hepatitis B surface antigen - negative on 03/08/2022   HIV Lab Results  Component Value Date   HIV Non Reactive 06/14/2022   HIV Non Reactive 03/08/2022   HIV NONREACTIVE 07/26/2016   Immunoglobulins   SPEP    Latest Ref Rng & Units 09/29/2022    2:16 PM  Serum Protein Electrophoresis  Total Protein 6.0 - 8.5 g/dL 7.5    G6PD No results found for: "G6PDH" TPMT No results found for: "TPMT"   Chest x-ray: 12/13/2020 - No active cardiopulmonary disease.  Assessment/Plan:  Reviewed importance of holding [place] signs/symptoms of an infections, if antibiotics are prescribed to treat an active infection, and with invasive procedures  Demonstrated proper injection technique with CIMZIA PFS demo device  Patient able to demonstrate proper injection technique using the teach back method.  I adminsitered first dose in left lower abdomen and patient's friend injected in the right lower abdomen with:  Sample Medication: Cimzia PFS 2058mmL x 2 syringes = 40049motal dose NDC: [id number]: [id number]: 09/2023  Patient tolerated well.  Observed for 30 mins in office for adverse reaction and none noted.   Patient is to return in 1 month for labs and 6-8 weeks for  follow-up appointment.  Standing orders placed. She has been advised of walk-in lab hours and advised to have labs completed prior to f/u appointment with Dr. Donnie Mesa approved through patient assistance .   Rx sent to:  [facility] [phone]).  Patient provided with pharmacy phone number. She has already received medication at home. She was advised by company that they will reach out to her to schedule refills.  Patient will continue CIMZIA 439m (as two divided injections) at Day 0 (administered in clinic today), Day 14 (11/15/2022), Day 28 (11/29/2022) then every 4 weeks thereafter (starting on 12/27/2022). She will continue Cimzia as above in combination with  sulfasalazine 10048mtwice daily. Advised that she can use ibuprofen very sparingly if needed.  All questions encouraged and answered.  Instructed patient to call with any further questions or concerns.  DeKnox SalivaPharmD, MPH, BCPS, CPP Clinical Pharmacist (Rheumatology and Pulmonology)  10/29/2022 2:05 PM

## 2022-11-01 ENCOUNTER — Ambulatory Visit: Payer: Self-pay | Attending: Internal Medicine | Admitting: Pharmacist

## 2022-11-01 DIAGNOSIS — Z79899 Other long term (current) drug therapy: Secondary | ICD-10-CM

## 2022-11-01 DIAGNOSIS — Z7189 Other specified counseling: Secondary | ICD-10-CM

## 2022-11-01 DIAGNOSIS — M069 Rheumatoid arthritis, unspecified: Secondary | ICD-10-CM

## 2022-11-04 ENCOUNTER — Telehealth: Payer: Self-pay

## 2022-11-04 NOTE — Telephone Encounter (Signed)
Telephoned patient at mobile number using  language line interpreter [phone]. Left a voice message  with BCCCP contact information.

## 2022-11-10 ENCOUNTER — Ambulatory Visit: Payer: Self-pay

## 2022-11-12 ENCOUNTER — Ambulatory Visit: Payer: Self-pay

## 2022-11-15 ENCOUNTER — Ambulatory Visit: Payer: Self-pay | Attending: Nurse Practitioner

## 2022-11-16 ENCOUNTER — Ambulatory Visit: Payer: Self-pay

## 2022-12-06 ENCOUNTER — Ambulatory Visit: Payer: Self-pay | Admitting: *Deleted

## 2022-12-06 NOTE — Telephone Encounter (Signed)
Noted  

## 2022-12-06 NOTE — Telephone Encounter (Signed)
  Chief Complaint: Abdominal Pain Symptoms: Pain at navel area, "The sides and on top." Tender to touch, radiates to back. Comes and goes, 8/10 at times. Reports fever over Thursday through [facility], nausea and vomiting. Chills now, subjective fever, no vomiting or diarrhea presently.  Frequency: 5 days Pertinent Negatives: Patient denies dysuria Disposition: [] ED /[x] Urgent Care (no appt availability in office) / [] Appointment(In office/virtual)/ []  [facility]/ [] Home Care/ [] Refused Recommended Disposition /[] [facility]/ []  Follow-up with PCP Additional Notes: No availability, advised UC. Care advise provided, pt verbalizes understanding.  Reason for Disposition  [1] MILD-MODERATE pain AND [2] constant AND [3] present > 2 hours  Answer Assessment - Initial Assessment Questions 1. LOCATION: "Where does it hurt?"      Close to navel, at sides and sometimes at top of navel 2. RADIATION: "Does the pain shoot anywhere else?" (e.g., chest, back)     To back 3. ONSET: "When did the pain begin?" (e.g., minutes, hours or days ago)      Last Wednesday 4. SUDDEN: "Gradual or sudden onset?"     Suddenly 5. PATTERN "Does the pain come and go, or is it constant?"    - If it comes and goes: "How long does it last?" "Do you have pain now?"     (Note: Comes and goes means the pain is intermittent. It goes away completely between bouts.)    - If constant: "Is it getting better, staying the same, or getting worse?"      (Note: Constant means the pain never goes away completely; most serious pain is constant and gets worse.)      Comes and goes 6. SEVERITY: "How bad is the pain?"  (e.g., Scale 1-10; mild, moderate, or severe)    - MILD (1-3): Doesn't interfere with normal activities, abdomen soft and not tender to touch.     - MODERATE (4-7): Interferes with normal activities or awakens from sleep, abdomen tender to touch.     - SEVERE (8-10): Excruciating pain, doubled over, unable to  do any normal activities.       8/10, tender to touch 7. RECURRENT SYMPTOM: "Have you ever had this type of stomach pain before?" If Yes, ask: "When was the last time?" and "What happened that time?"       8. CAUSE: "What do you think is causing the stomach pain?"      9. RELIEVING/AGGRAVATING FACTORS: "What makes it better or worse?" (e.g., antacids, bending or twisting motion, bowel movement)      10. OTHER SYMPTOMS: "Do you have any other symptoms?" (e.g., back pain, diarrhea, fever, urination pain, vomiting)       Thursday- Sat, fever, vomiting. May have fever presently  Protocols used: Abdominal Pain - Female-A-AH

## 2022-12-07 ENCOUNTER — Ambulatory Visit (HOSPITAL_COMMUNITY)
Admission: EM | Admit: 2022-12-07 | Discharge: 2022-12-07 | Disposition: A | Discharge status: Home / Self Care | Payer: Self-pay | Attending: Internal Medicine | Admitting: Internal Medicine

## 2022-12-07 ENCOUNTER — Encounter (HOSPITAL_COMMUNITY): Payer: Self-pay

## 2022-12-07 DIAGNOSIS — A084 Viral intestinal infection, unspecified: Secondary | ICD-10-CM

## 2022-12-07 DIAGNOSIS — Z3202 Encounter for pregnancy test, result negative: Secondary | ICD-10-CM

## 2022-12-07 LAB — POCT URINALYSIS DIPSTICK, ED / UC
Bilirubin Urine: NEGATIVE
Glucose, UA: NEGATIVE mg/dL
Ketones, ur: NEGATIVE mg/dL
Leukocytes,Ua: NEGATIVE
Nitrite: NEGATIVE
Protein, ur: NEGATIVE mg/dL
Specific Gravity, Urine: 1.015 (ref 1.005–1.030)
Urobilinogen, UA: 0.2 mg/dL (ref 0.0–1.0)
pH: 8.5 — ABNORMAL HIGH (ref 5.0–8.0)

## 2022-12-07 LAB — POC URINE PREG, ED: Preg Test, Ur: NEGATIVE

## 2022-12-07 MED ORDER — ONDANSETRON 4 MG PO TBDP: 4.0000 mg | ORAL_TABLET | Freq: Three times a day (TID) | ORAL | 0 refills | Status: DC | PRN

## 2022-12-07 MED ORDER — ONDANSETRON 4 MG PO TBDP
4.0000 mg | ORAL_TABLET | Freq: Once | ORAL | Status: AC
  Administered 2022-12-07: 4 mg via ORAL

## 2022-12-07 MED ORDER — ONDANSETRON 4 MG PO TBDP
ORAL_TABLET | ORAL | Status: AC
  Filled 2022-12-07: qty 1

## 2022-12-07 NOTE — ED Triage Notes (Signed)
Pt states abdominal pain around her belly button,with nausea and chills  states she vomited yesterday and today.

## 2022-12-07 NOTE — ED Provider Notes (Signed)
[facility]    CSN: [id number] Arrival date & time: 12/07/22  1647      History   Chief Complaint Chief Complaint  Patient presents with   Abdominal Pain    HPI Karina Murphy is a 42 y.o. female.   Patient presents to urgent care for evaluation of periumbilical abdominal discomfort, nausea, vomiting, diarrhea, and generalized fatigue that started yesterday. Last episode of emesis was approximately 2 to 3 hours ago.  She has not had anything to eat or drink by mouth since 1 AM this morning and states she has had a significantly decreased appetite due to nausea and vomiting.  Defecating and vomiting improved abdominal discomfort.  Recently gave birth in December 2023 (3 months ago) vaginally.  No recent abdominal surgeries.  Remote history of cholecystectomy.  Denies history of appendectomy.  No recent antibiotic or steroid use.  No recent known sick contacts with similar symptoms.  Abdominal discomfort is to the periumbilical region and sometimes radiates to the left upper aspect of the abdomen.  She is currently nauseous.  No blood/mucus to the stools.  Emesis is nonbloody/nonbilious.  She has not attempted use of any over-the-counter medications before coming to urgent care for symptoms.  She is sexually active with her monogamous female partner without condoms. Unsure of pregnancy status.    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   Urticaria    Weakness 11/23/2016    Patient Active Problem List   Diagnosis Date Noted   High risk medication use 07/30/2022   Language barrier 03/31/2022   AMA (advanced maternal age) multigravida 35+ 02/04/2022   Rheumatoid arthritis ([place]) 02/12/2014   Obese 12/26/2013    Past Surgical History:  Procedure  Laterality Date   CHOLECYSTECTOMY  07/2020    OB History     Gravida  3   Para  3   Term  3   Preterm  0   AB  0   Living  3      SAB  0   IAB  0   Ectopic  0   Multiple  0   Live Births  3            Home Medications    Prior to Admission medications   Medication Sig Start Date End Date Taking? Authorizing Provider  ondansetron (ZOFRAN-ODT) 4 MG disintegrating tablet Take 1 tablet (4 mg total) by mouth every 8 (eight) hours as needed for nausea or vomiting. 12/07/22  Yes Talbot Grumbling, FNP  acetaminophen (TYLENOL) 325 MG tablet Take 2 tablets (650 mg total) by mouth every 4 (four) hours as needed (for pain scale < 4). Patient not taking: Reported on 09/29/2022 08/26/22   Wouk, Ailene Rud, MD  Certolizumab Pegol (CIMZIA STARTER KIT) 6 X 200 MG/ML PSKT Inject 400mg  (as two divided injections) into the skin at Week 0, Week 2, Week 4. 10/12/22   Rice, Resa Miner, MD  certolizumab pegol (CIMZIA) 2 X 200 MG/ML PSKT prefilled syringe Inject 400 mg into the skin every 28 (twenty-eight) days. 11/10/22   Collier Salina, MD  ibuprofen (ADVIL) 600 MG tablet Take 1 tablet (600 mg total) by mouth every 6 (six) hours. 08/26/22  Wouk, Ailene Rud, MD  norethindrone (MICRONOR) 0.35 MG tablet Take 1 tablet (0.35 mg total) by mouth daily. 09/29/22   Woodroe Mode, MD  Prenatal Vit-Fe Fumarate-FA (PREPLUS) 27-1 MG TABS Take 1 tablet by mouth daily. 03/08/22   Patriciaann Clan, DO  sulfaSALAzine (AZULFIDINE) 500 MG tablet Take 2 tablets (1,000 mg total) by mouth 2 (two) times daily. Start with 1 tablet 2 times daily for 2 weeks then increase if tolerable. 07/30/22   Rice, Resa Miner, MD  triamcinolone ointment (KENALOG) 0.5 % Apply 1 Application topically 2 (two) times daily as needed. 09/27/22   Rice, Resa Miner, MD    Family History Family History  Problem Relation Age of Onset   Diabetes Mother    Hypertension Mother    Diabetes Brother    Stomach cancer  Neg Hx    Colon cancer Neg Hx    Esophageal cancer Neg Hx    Pancreatic cancer Neg Hx    Asthma Neg Hx    Cancer Neg Hx    Heart disease Neg Hx    Stroke Neg Hx     Social History Social History   Tobacco Use   Smoking status: Never    Passive exposure: Never   Smokeless tobacco: Never  Vaping Use   Vaping Use: Never used  Substance Use Topics   Alcohol use: No   Drug use: No     Allergies   Pineapple and Morphine   Review of Systems Review of Systems  Gastrointestinal:  Positive for abdominal pain.  Per HPI   Physical Exam Triage Vital Signs ED Triage Vitals  Enc Vitals Group     BP 12/07/22 1729 118/78     Pulse Rate 12/07/22 1729 70     Resp 12/07/22 1729 16     Temp 12/07/22 1729 98.3 F (36.8 C)     Temp Source 12/07/22 1729 Oral     SpO2 12/07/22 1729 99 %     Weight --      Height --      Head Circumference --      Peak Flow --      Pain Score 12/07/22 1731 8     Pain Loc --      Pain Edu? --      Excl. in [place]? --    No data found.  Updated Vital Signs BP 118/78 (BP Location: Left Arm)   Pulse 70   Temp 98.3 F (36.8 C) (Oral)   Resp 16   SpO2 99%   Breastfeeding Yes   Visual Acuity Right Eye Distance:   Left Eye Distance:   Bilateral Distance:    Right Eye Near:   Left Eye Near:    Bilateral Near:     Physical Exam Vitals and nursing note reviewed.  Constitutional:      Appearance: She is not ill-appearing or toxic-appearing.  HENT:     Head: Normocephalic and atraumatic.     Right Ear: Hearing and external ear normal.     Left Ear: Hearing and external ear normal.     Nose: Nose normal.     Mouth/Throat:     Lips: Pink.  Eyes:     General: Lids are normal. Vision grossly intact. Gaze aligned appropriately.     Extraocular Movements: Extraocular movements intact.     Conjunctiva/sclera: Conjunctivae normal.  Cardiovascular:     Rate and Rhythm: Normal rate and regular rhythm.     Heart sounds: Normal  heart sounds, S1  normal and S2 normal.  Pulmonary:     Effort: Pulmonary effort is normal. No respiratory distress.     Breath sounds: Normal breath sounds and air entry.  Abdominal:     General: Abdomen is flat. Bowel sounds are normal.     Palpations: Abdomen is soft.     Tenderness: There is abdominal tenderness in the periumbilical area and left upper quadrant. There is no right CVA tenderness, left CVA tenderness, guarding or rebound. Negative signs include Murphy's sign, McBurney's sign and psoas sign.     Comments: No peritoneal signs elicited to abdominal exam.  Musculoskeletal:     Cervical back: Neck supple.  Skin:    General: Skin is warm and dry.     Capillary Refill: Capillary refill takes less than 2 seconds.     Findings: No rash.  Neurological:     General: No focal deficit present.     Mental Status: She is alert and oriented to person, place, and time. Mental status is at baseline.     Cranial Nerves: No dysarthria or facial asymmetry.  Psychiatric:        Mood and Affect: Mood normal.        Speech: Speech normal.        Behavior: Behavior normal.        Thought Content: Thought content normal.        Judgment: Judgment normal.      UC Treatments / Results  Labs (all labs ordered are listed, but only abnormal results are displayed) Labs Reviewed  POCT URINALYSIS DIPSTICK, ED / UC - Abnormal; Notable for the following components:      Result Value   Hgb urine dipstick SMALL (*)    pH 8.5 (*)    All other components within normal limits  POC URINE PREG, ED    EKG   Radiology No results found.  Procedures Procedures (including critical care time)  Medications Ordered in UC Medications  ondansetron (ZOFRAN-ODT) disintegrating tablet 4 mg (4 mg Oral Given 12/07/22 1845)    Initial Impression / Assessment and Plan / UC Course  I have reviewed the triage vital signs and the nursing notes.  Pertinent labs & imaging results that were available during my care of the  patient were reviewed by me and considered in my medical decision making (see chart for details).   1. Viral gastroenteritis Presentation is consistent with acute viral gastroenteritis that will likely improve with rest, increased fluid intake, and as needed use of antiemetics. Abdominal exam is without peritoneal signs, patient is nontoxic in appearance, and vitals are hemodynamically stable. Push fluids with water and pedialyte for rehydration. Zofran 4mg  ODT given in clinic for nausea. May use zofran 4mg  ODT every 8 hours as needed for nausea and vomiting. May use tylenol for abdominal discomfort at home related to viral illness. Bland/liquid diet recommended for 12-24 hours, then increase diet to normal as tolerated.  Negative urine pregnancy test. Urinalysis unremarkable for signs of UTI. Advised to push fluids.  Discussed physical exam and available lab work findings in clinic with patient.  Counseled patient regarding appropriate use of medications and potential side effects for all medications recommended or prescribed today. Discussed red flag signs and symptoms of worsening condition,when to call the PCP office, return to urgent care, and when to seek higher level of care in the emergency department. Patient verbalizes understanding and agreement with plan. All questions answered. Patient discharged in stable condition.  Final Clinical Impressions(s) / UC Diagnoses   Final diagnoses:  Viral gastroenteritis  Negative pregnancy test     Discharge Instructions      Your evaluation suggests that your symptoms are most likely due to viral stomach illness (gastroenteritis) which will improve on its own with rest and fluids in the next few days.   I have prescribed an antinausea medication for you to take at home called Zofran. It is the same medication that we gave you in the office.  You may use tylenol 1,000mg  every 6 hours over the counter as needed for abdominal discomfort related to  this virus.   Eat a bland diet for the next 12-24 hours (bananas, rice, white toast, and applesauce) once you are able to tolerate liquids (broth, etc). These foods are easy for your stomach to digest. Pedialyte can be purchased to help with rehydration. Drink plenty of water.   Please follow up with your primary care provider for further management. Return if you experience worsening or uncontrolled pain, inability to tolerate fluids by mouth, difficulty breathing, fevers 100.26F or greater, recurrent vomiting, or any other concerning symptoms. I hope you feel better!      ED Prescriptions     Medication Sig Dispense Auth. Provider   ondansetron (ZOFRAN-ODT) 4 MG disintegrating tablet Take 1 tablet (4 mg total) by mouth every 8 (eight) hours as needed for nausea or vomiting. 20 tablet Talbot Grumbling, FNP      PDMP not reviewed this encounter.   Talbot Grumbling, [place] 12/07/22 [phone]

## 2022-12-07 NOTE — Discharge Instructions (Addendum)

## 2022-12-26 NOTE — Progress Notes (Signed)
Office Visit Note  Patient: Karina Murphy             Date of Birth: 12/06/1980           MRN: [id number]             PCP: Claiborne Rigg, NP Referring: Claiborne Rigg, NP Visit Date: 12/27/2022   Subjective:  Follow-up (Patient states she has a rash on her arm and sometimes on her feet.)   History of Present Illness: Karina Murphy is a 42 y.o. female here for follow up for seropositive RA after starting Cimzia currently finishing loading dose before transition to 400 mg subcu every 4 weeks.  Since her last visit she also discontinued the sulfasalazine due to not seeing clinical benefit from this.  So far symptoms are largely improved since starting the Cimzia with decreased joint pain and swelling.  Still has some pain and has morning stiffness every day but with decreased severity.  Has not noticed any major flareup or exacerbation from stopping the sulfasalazine.  She had an episode of viral gastroenteritis in March that resolved without major complication but has had some ongoing lower abdominal pain afterwards.  Still having skin rashes most often around her hand and wrist or foot.  Also with some eye itching and redness consistent with seasonal allergy symptoms.  Previous HPI 09/27/22 Karina Murphy is a 42 y.o. 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 She delivered her baby last month uneventfully. For about 3 weeks notices new skin rashes on her right forearm and on both legs above the knees. This is itchy and bothers her most at night time.    Previous HPI 07/30/22 Karina Murphy is a 42 y.o. female here for seropositive RA. She was previously seeing rheumatology after onset of symptoms around 2014 with hand and foot inflammation initially.  She is also experienced involvement of bilateral shoulder pain and stiffness that started later.  She was on treatment but stopped following up after her  rheumatologist left that practice in 2018.  Previous treatments including methotrexate and sulfasalazine and prednisone.  She has pretty much been off any maintenance medication since that time treatments have often consisted of intramuscular steroid injections as needed for disease flareups.  She is getting evaluation now largely due to increased healthcare system exposure with her pregnancy.  Her arthritis symptoms have overall been doing better during this pregnancy compared to before.  She is not taking any medications regularly for pain.  Worst affected area at the moment is pain and swelling in her right foot.   This encounter was conducted with assistance of in person Spanish language interpreter.   Review of Systems  Constitutional:  Positive for fatigue.  HENT:  Positive for mouth dryness. Negative for mouth sores.   Eyes:  Negative for dryness.  Respiratory:  Negative for shortness of breath.   Cardiovascular:  Negative for chest pain and palpitations.  Gastrointestinal:  Positive for constipation and diarrhea. Negative for blood in stool.  Endocrine: Negative for increased urination.  Genitourinary:  Negative for involuntary urination.  Musculoskeletal:  Positive for joint pain, joint pain and joint swelling. Negative for gait problem, myalgias, muscle weakness, morning stiffness, muscle tenderness and myalgias.  Skin:  Positive for rash and hair loss. Negative for color change and sensitivity to sunlight.  Allergic/Immunologic: Negative for susceptible to infections.  Neurological:  Positive for dizziness and headaches.  Hematological:  Negative for  swollen glands.  Psychiatric/Behavioral:  Negative for depressed mood and sleep disturbance. The patient is nervous/anxious.     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    Immunization History  Administered Date(s) Administered   Influenza,inj,Quad PF,6+ Mos 06/28/2014, 09/17/2016, 06/14/2022   Tdap 06/14/2022     Objective: Vital Signs: BP 136/71 (BP Location: Right Arm, Patient Position: Sitting, Cuff Size: Normal)   Pulse 69   Resp 14   Ht 5\' 3"  (1.6 m)   Wt 172 lb (78 kg)   LMP 12/04/2022   Breastfeeding Yes   BMI 30.47 kg/m    Physical Exam Eyes:     Comments: Right greater than left conjunctival injection, no periorbital swelling  Cardiovascular:     Rate and Rhythm: Normal rate and regular rhythm.  Pulmonary:     Effort: Pulmonary effort is normal.     Breath sounds: Normal breath sounds.  Skin:    Findings: Rash present.     Comments: Area about 3cm diameter on right forearm extensor surface with small multiple small erythematous papules some unroofed  Neurological:     Mental Status: She is alert.      Musculoskeletal Exam:  Shoulders full ROM left shoulder tenderness to pressure, no palpable swelling Elbows full ROM no tenderness or swelling Wrists reduced ROM, no tenderness or swelling Fingers with chronic MCP  joint widening swan-neck deformities incompletely reducible and mild MCP subluxation, no tenderness to pressure or palpable synovitis Knees full ROM no tenderness or swelling   CDAI Exam: CDAI Score: 6  Patient Global: 30 mm; Provider Global: 20 mm Swollen: 0 ; Tender: 1  Joint Exam 12/27/2022      Right  Left  Glenohumeral      Tender     Investigation: No additional findings.  Imaging: No results found.  Recent Labs: Lab Results  Component Value Date   WBC 4.4 09/29/2022   HGB 10.9 (L) 09/29/2022   PLT 279 09/29/2022   NA 139 09/29/2022   K 4.0 09/29/2022   CL 101 09/29/2022   CO2 25 09/29/2022   GLUCOSE 99 09/29/2022   BUN 11 09/29/2022   CREATININE 0.97 09/29/2022   BILITOT 0.6 09/29/2022   ALKPHOS 87 09/29/2022   AST 18 09/29/2022   ALT 12  09/29/2022   PROT 7.5 09/29/2022   ALBUMIN 4.0 09/29/2022   CALCIUM 9.6 09/29/2022   GFRAA 129 03/19/2020   QFTBGOLDPLUS Negative 09/29/2022    Speciality Comments: Cimzia started 11/01/2022  Procedures:  No procedures performed Allergies: Bee pollen, Pineapple, and Morphine   Assessment / Plan:     Visit Diagnoses: Rheumatoid arthritis, involving unspecified site, unspecified whether rheumatoid factor present - Plan: Sedimentation rate  Disease activity is dramatically improved since starting Cimzia loading dose is now transition to maintenance. will recheck sed rate for disease activity monitoring.  Continue Cimzia 400 mg subcu monthly.  High risk medication use - Plan: CBC with Differential/Platelet, COMPLETE METABOLIC PANEL WITH GFR  Checking CBC and CMP for medication monitoring on Cimzia treatment.  She was recently sick with a case of viral gastroenteritis but no other infections.  Allergic conjunctivitis of both eyes  Discussed use of over-the-counter oral antihistamine treatment and if needed supportive care with eyedrop medication.  I do not believe related to her new treatment although reported symptoms  as more severe than typical for her in the spring.  Orders: Orders Placed This Encounter  Procedures   Sedimentation rate   CBC with Differential/Platelet   COMPLETE METABOLIC PANEL WITH GFR   No orders of the defined types were placed in this encounter.    Follow-Up Instructions: No follow-ups on file.   Fuller Plan, MD  Note - This record has been created using [employer].  Chart creation errors have been sought, but may not always  have been located. Such creation errors do not reflect on  the standard of medical care.

## 2022-12-27 ENCOUNTER — Ambulatory Visit: Payer: Self-pay | Attending: Internal Medicine | Admitting: Internal Medicine

## 2022-12-27 ENCOUNTER — Other Ambulatory Visit (HOSPITAL_COMMUNITY)
Admission: AD | Admit: 2022-12-27 | Discharge: 2022-12-27 | Disposition: A | Discharge status: Home / Self Care | Payer: Self-pay | Source: Ambulatory Visit | Attending: Internal Medicine | Admitting: Internal Medicine

## 2022-12-27 ENCOUNTER — Encounter: Payer: Self-pay | Admitting: Internal Medicine

## 2022-12-27 VITALS — BP 136/71 | HR 69 | Resp 14 | Ht 63.0 in | Wt 172.0 lb

## 2022-12-27 DIAGNOSIS — M069 Rheumatoid arthritis, unspecified: Secondary | ICD-10-CM

## 2022-12-27 DIAGNOSIS — Z79899 Other long term (current) drug therapy: Secondary | ICD-10-CM | POA: Insufficient documentation

## 2022-12-27 DIAGNOSIS — H1013 Acute atopic conjunctivitis, bilateral: Secondary | ICD-10-CM

## 2022-12-27 DIAGNOSIS — H1010 Acute atopic conjunctivitis, unspecified eye: Secondary | ICD-10-CM | POA: Insufficient documentation

## 2022-12-27 LAB — CBC WITH DIFFERENTIAL/PLATELET
Abs Immature Granulocytes: 0 10*3/uL (ref 0.00–0.07)
Basophils Absolute: 0 10*3/uL (ref 0.0–0.1)
Basophils Relative: 1 %
Eosinophils Absolute: 0.1 10*3/uL (ref 0.0–0.5)
Eosinophils Relative: 4 %
HCT: 33.8 % — ABNORMAL LOW (ref 36.0–46.0)
Hemoglobin: 10.8 g/dL — ABNORMAL LOW (ref 12.0–15.0)
Immature Granulocytes: 0 %
Lymphocytes Relative: 55 %
Lymphs Abs: 2 10*3/uL (ref 0.7–4.0)
MCH: 27 pg (ref 26.0–34.0)
MCHC: 32 g/dL (ref 30.0–36.0)
MCV: 84.5 fL (ref 80.0–100.0)
Monocytes Absolute: 0.4 10*3/uL (ref 0.1–1.0)
Monocytes Relative: 11 %
Neutro Abs: 1.1 10*3/uL — ABNORMAL LOW (ref 1.7–7.7)
Neutrophils Relative %: 29 %
Platelets: 244 10*3/uL (ref 150–400)
RBC: 4 MIL/uL (ref 3.87–5.11)
RDW: 14.5 % (ref 11.5–15.5)
WBC: 3.6 10*3/uL — ABNORMAL LOW (ref 4.0–10.5)
nRBC: 0 % (ref 0.0–0.2)

## 2022-12-27 LAB — COMPREHENSIVE METABOLIC PANEL
ALT: 21 U/L (ref 0–44)
AST: 19 U/L (ref 15–41)
Albumin: 3.7 g/dL (ref 3.5–5.0)
Alkaline Phosphatase: 52 U/L (ref 38–126)
Anion gap: 7 (ref 5–15)
BUN: 13 mg/dL (ref 6–20)
CO2: 26 mmol/L (ref 22–32)
Calcium: 9.1 mg/dL (ref 8.9–10.3)
Chloride: 105 mmol/L (ref 98–111)
Creatinine, Ser: 0.91 mg/dL (ref 0.44–1.00)
GFR, Estimated: >60 mL/min (ref >60)
Glucose, Bld: 100 mg/dL — ABNORMAL HIGH (ref 70–99)
Potassium: 3.8 mmol/L (ref 3.5–5.1)
Sodium: 138 mmol/L (ref 135–145)
Total Bilirubin: 1.1 mg/dL (ref 0.3–1.2)
Total Protein: 7.5 g/dL (ref 6.5–8.1)

## 2022-12-27 LAB — SEDIMENTATION RATE: Sed Rate: 24 mm/hr — ABNORMAL HIGH (ref 0–22)

## 2022-12-27 MED ORDER — CERTOLIZUMAB PEGOL 2 X 200 MG ~~LOC~~ KIT
400.0000 mg | PACK | Freq: Once | SUBCUTANEOUS | Status: AC
  Administered 2022-12-27: 400 mg via SUBCUTANEOUS

## 2022-12-27 NOTE — Progress Notes (Signed)
Administrations This Visit     certolizumab pegol (CIMZIA) kit 400 mg     Admin Date 12/27/2022 Action Given Dose 400 mg Route Subcutaneous Administered By Henriette Combs, LPN

## 2022-12-28 NOTE — Progress Notes (Signed)
Sedimentation rate is 24 this is improved from 61 3 months ago and almost normal now. Kidney and liver function are normal. Her white blood cell count is mildly decreased to 3.6. This is probably from the medication. It is not so low that we have to change her plan right now, but if it gets lower in the future we would need to change the dose.

## 2023-03-09 ENCOUNTER — Other Ambulatory Visit: Payer: Self-pay | Admitting: Internal Medicine

## 2023-03-09 DIAGNOSIS — M069 Rheumatoid arthritis, unspecified: Secondary | ICD-10-CM

## 2023-03-09 DIAGNOSIS — Z79899 Other long term (current) drug therapy: Secondary | ICD-10-CM

## 2023-03-10 NOTE — Telephone Encounter (Signed)
Last Fill: 11/10/2022  Labs: 12/27/2022 Sedimentation rate is 24 this is improved from 61 3 months ago and almost normal now. Kidney and liver function are normal. Her white blood cell count is mildly decreased to 3.6. This is probably from the medication. It is not so low that we have to change her plan right now, but if it gets lower in the future we would need to change the dose.  TB Gold: 09/29/2022    Next Visit: 03/28/2023  Last Visit: 12/27/2022  ON:[id number] arthritis, involving unspecified site, unspecified whether rheumatoid factor present   Current Dose per office note 12/27/2022: Cimzia 400 mg subcu monthly   Okay to refill Cimzia?

## 2023-03-24 ENCOUNTER — Other Ambulatory Visit: Payer: Self-pay | Admitting: Internal Medicine

## 2023-03-24 DIAGNOSIS — M069 Rheumatoid arthritis, unspecified: Secondary | ICD-10-CM

## 2023-03-24 DIAGNOSIS — Z79899 Other long term (current) drug therapy: Secondary | ICD-10-CM

## 2023-03-24 NOTE — Telephone Encounter (Signed)
Last Fill: 03/10/2023 (30 day supply)  Labs: 12/27/2022 Sedimentation rate is 24 this is improved from 61 3 months ago and almost normal now. Kidney and liver function are normal. Her white blood cell count is mildly decreased to 3.6.  TB Gold: 09/29/2022 Neg   Next Visit: 03/28/2023  Last Visit: 12/27/2022  DX: Rheumatoid arthritis, involving unspecified site, unspecified whether rheumatoid factor present   Current Dose per office note 12/27/2022: Cimzia 400 mg subcu monthly   Okay to refill Cimzia?

## 2023-03-27 NOTE — Progress Notes (Signed)
Office Visit Note  Patient: Karina Murphy             Date of Birth: [birth year]           MRN: [id number]             PCP: Fuller Plan, MD Referring: Claiborne Rigg, NP Visit Date: 03/28/2023   Subjective:  Follow-up   History of Present Illness: Karina Murphy is a 42 y.o. 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 Since last visit her joint pain is doing well without prolonged morning stiffness. Left shoulder pain is partially better, but has some increased wrist pain near the base of the thumbs. Frequently worsened when lifting her child. No interval infections or antibiotics. Skin rashes are ongoing on her arms, tried topical steroid that was prescribed did not help much. Started using an OTC eczema cream that is helping partially.  Previous HPI 12/27/22 Karina Murphy is a 42 y.o. female here for follow up for seropositive RA after starting Cimzia currently finishing loading dose before transition to 400 mg subcu every 4 weeks.  Since her last visit she also discontinued the sulfasalazine due to not seeing clinical benefit from this.  So far symptoms are largely improved since starting the Cimzia with decreased joint pain and swelling.  Still has some pain and has morning stiffness every day but with decreased severity.  Has not noticed any major flareup or exacerbation from stopping the sulfasalazine.  She had an episode of viral gastroenteritis in March that resolved without major complication but has had some ongoing lower abdominal pain afterwards.  Still having skin rashes most often around her hand and wrist or foot.  Also with some eye itching and redness consistent with seasonal allergy symptoms.   Previous HPI 09/27/22 Karina Murphy is a 42 y.o. 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 She delivered her baby last month uneventfully. For about 3 weeks  notices new skin rashes on her right forearm and on both legs above the knees. This is itchy and bothers her most at night time.    Previous HPI 07/30/22 Karina Murphy is a 42 y.o. female here for seropositive RA. She was previously seeing rheumatology after onset of symptoms around 2014 with hand and foot inflammation initially.  She is also experienced involvement of bilateral shoulder pain and stiffness that started later.  She was on treatment but stopped following up after her rheumatologist left that practice in 2018.  Previous treatments including methotrexate and sulfasalazine and prednisone.  She has pretty much been off any maintenance medication since that time treatments have often consisted of intramuscular steroid injections as needed for disease flareups.  She is getting evaluation now largely due to increased healthcare system exposure with her pregnancy.  Her arthritis symptoms have overall been doing better during this pregnancy compared to before.  She is not taking any medications regularly for pain.  Worst affected area at the moment is pain and swelling in her right foot.   This encounter was conducted with assistance of in person Spanish language interpreter.   Review of Systems  Constitutional:  Negative for fatigue.  HENT:  Positive for mouth dryness. Negative for mouth sores.   Eyes:  Negative for dryness.  Respiratory:  Negative for shortness of breath.   Cardiovascular:  Negative for chest pain and palpitations.  Gastrointestinal:  Negative for blood in  stool, constipation and diarrhea.  Endocrine: Negative for increased urination.  Genitourinary:  Negative for involuntary urination.  Musculoskeletal:  Positive for joint pain, joint pain and muscle tenderness. Negative for gait problem, joint swelling, myalgias, muscle weakness, morning stiffness and myalgias.  Skin:  Positive for color change, rash, hair loss and sensitivity to sunlight.  Allergic/Immunologic:  Negative for susceptible to infections.  Neurological:  Positive for dizziness. Negative for headaches.  Hematological:  Negative for swollen glands.  Psychiatric/Behavioral:  Positive for sleep disturbance. Negative for depressed mood. The patient is nervous/anxious.     PMFS History:  Patient Active Problem List   Diagnosis Date Noted   Radial styloid tenosynovitis (de quervain) 03/28/2023   Allergic conjunctivitis 12/27/2022   High risk medication use 07/30/2022   Language barrier 03/31/2022   AMA (advanced maternal age) multigravida 35+ 02/04/2022   Rash and other nonspecific skin eruption 08/31/2016   Seropositive rheumatoid arthritis (HCC) 02/12/2014   Obese 12/26/2013    Past Medical History:  Diagnosis Date   Anxiety    Arthritis, rheumatoid (HCC)    Breast pain, left 09/17/2016   Chronic headaches    GERD (gastroesophageal reflux disease)    Hypertension    Obesity    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   Urticaria    Weakness 11/23/2016    Family History  Problem Relation Age of Onset   Diabetes Mother    Hypertension Mother    Diabetes Brother    Stomach cancer Neg Hx    Colon cancer Neg Hx    Esophageal cancer Neg Hx    Pancreatic cancer Neg Hx    Asthma Neg Hx    Cancer Neg Hx    Heart disease Neg Hx    Stroke Neg Hx    Past Surgical History:  Procedure Laterality Date   CHOLECYSTECTOMY  07/2020   Social History   Social History Narrative   Completed 2nd grade.    Immunization History  Administered Date(s) Administered   Influenza,inj,Quad PF,6+ Mos 06/28/2014, 09/17/2016, 06/14/2022   Tdap 06/14/2022     Objective: Vital Signs: BP 111/73 (BP Location: Right Arm, Patient Position: Sitting, Cuff Size: Normal)   Pulse 76   Resp 12   Ht 5\' 3"  (1.6 m)   Wt 185 lb (83.9 kg)   LMP 03/28/2023   Breastfeeding Yes   BMI 32.77 kg/m    Physical Exam Eyes:     Conjunctiva/sclera: Conjunctivae normal.  Cardiovascular:     Rate and Rhythm:  Normal rate and regular rhythm.  Pulmonary:     Effort: Pulmonary effort is normal.     Breath sounds: Normal breath sounds.  Lymphadenopathy:     Cervical: No cervical adenopathy.  Skin:    General: Skin is warm and dry.     Findings: Rash present.     Comments: Patches of rash with flat, excoriated, healing macules on right upper arm and right and left forearms  Neurological:     Mental Status: She is alert.  Psychiatric:        Mood and Affect: Mood normal.      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  Imaging: No results found.  Recent Labs: Lab Results  Component Value Date   WBC 3.6 (L) 12/27/2022   HGB 10.8 (L) 12/27/2022   PLT 244 12/27/2022   NA 138 12/27/2022   K 3.8 12/27/2022   CL 105 12/27/2022   CO2 26 12/27/2022   GLUCOSE 100 (H) 12/27/2022   BUN 13 12/27/2022   CREATININE 0.91 12/27/2022   BILITOT 1.1 12/27/2022   ALKPHOS 52 12/27/2022   AST 19 12/27/2022   ALT 21 12/27/2022   PROT 7.5 12/27/2022   ALBUMIN 3.7 12/27/2022   CALCIUM 9.1 12/27/2022   GFRAA 129 03/19/2020   QFTBGOLDPLUS Negative 09/29/2022    Speciality Comments: Cimzia started 11/01/2022  Procedures:  No procedures performed Allergies: Bee pollen, Pineapple, and Morphine   Assessment / Plan:     Visit Diagnoses:  Seropositive rheumatoid arthritis (HCC) - Plan: Sedimentation rate  Joint inflammation appears well-controlled on the Cimzia.  Has few tender areas I think the risk is more use related.  Checking sed rate for disease activity monitoring.  Plan to continue Cimzia 400 mg subcu monthly.  High risk medication use - Plan: CBC with Differential/Platelet, COMPLETE METABOLIC PANEL WITH GFR  Checking CBC and CMP for medication monitoring on continued use of Cimzia.  Tolerating injections without issue.  Has not suffered any serious interval infections.  She is still actively breast-feeding her young daughter so avoiding adding any toxic medications.  Radial styloid tenosynovitis (de quervain)  Wrist pain most consistent with de Quervain's tenosynovitis.  Not bad enough that she is wanting to do local steroid injection.  Discussed supportive treatment for this and try to avoid excessive lifting along this axis.  Rash and other nonspecific skin eruption  Not sure about the current skin rash does not look like an medication reaction.  May be atopic dermatitis did not prove much with steroids though but currently manageable with OTC treatment.  Orders: Orders Placed This Encounter  Procedures   Sedimentation rate   CBC with Differential/Platelet   COMPLETE METABOLIC PANEL WITH GFR   No orders of the defined types were placed in this encounter.    Follow-Up Instructions: Return in about 3 months (around 06/28/2023) for RA on CIM f/u 3mos.   Fuller Plan, MD  Note - This record has been created using [employer].  Chart creation errors have been sought, but may not always  have been located. Such creation errors do not reflect on  the standard of medical care.

## 2023-03-28 ENCOUNTER — Ambulatory Visit: Payer: Self-pay | Attending: Internal Medicine | Admitting: Internal Medicine

## 2023-03-28 ENCOUNTER — Other Ambulatory Visit (HOSPITAL_COMMUNITY)
Admission: AD | Admit: 2023-03-28 | Discharge: 2023-03-28 | Disposition: A | Discharge status: Home / Self Care | Payer: Self-pay | Source: Ambulatory Visit | Attending: Internal Medicine | Admitting: Internal Medicine

## 2023-03-28 ENCOUNTER — Encounter: Payer: Self-pay | Admitting: Internal Medicine

## 2023-03-28 VITALS — BP 111/73 | HR 76 | Resp 12 | Ht 63.0 in | Wt 185.0 lb

## 2023-03-28 DIAGNOSIS — M059 Rheumatoid arthritis with rheumatoid factor, unspecified: Secondary | ICD-10-CM | POA: Insufficient documentation

## 2023-03-28 DIAGNOSIS — R21 Rash and other nonspecific skin eruption: Secondary | ICD-10-CM

## 2023-03-28 DIAGNOSIS — Z79899 Other long term (current) drug therapy: Secondary | ICD-10-CM

## 2023-03-28 DIAGNOSIS — M654 Radial styloid tenosynovitis [de Quervain]: Secondary | ICD-10-CM

## 2023-03-28 LAB — CBC WITH DIFFERENTIAL/PLATELET
Abs Immature Granulocytes: 0 10*3/uL (ref 0.00–0.07)
Basophils Absolute: 0 10*3/uL (ref 0.0–0.1)
Basophils Relative: 1 %
Eosinophils Absolute: 0.1 10*3/uL (ref 0.0–0.5)
Eosinophils Relative: 5 %
HCT: 37.6 % (ref 36.0–46.0)
Hemoglobin: 11.8 g/dL — ABNORMAL LOW (ref 12.0–15.0)
Immature Granulocytes: 0 %
Lymphocytes Relative: 60 %
Lymphs Abs: 1.9 10*3/uL (ref 0.7–4.0)
MCH: 26.2 pg (ref 26.0–34.0)
MCHC: 31.4 g/dL (ref 30.0–36.0)
MCV: 83.6 fL (ref 80.0–100.0)
Monocytes Absolute: 0.4 10*3/uL (ref 0.1–1.0)
Monocytes Relative: 15 %
Neutro Abs: 0.6 10*3/uL — ABNORMAL LOW (ref 1.7–7.7)
Neutrophils Relative %: 19 %
Platelets: 249 10*3/uL (ref 150–400)
RBC: 4.5 MIL/uL (ref 3.87–5.11)
RDW: 14.2 % (ref 11.5–15.5)
WBC: 3 10*3/uL — ABNORMAL LOW (ref 4.0–10.5)
nRBC: 0 % (ref 0.0–0.2)

## 2023-03-28 LAB — COMPREHENSIVE METABOLIC PANEL
ALT: 15 U/L (ref 0–44)
AST: 17 U/L (ref 15–41)
Albumin: 3.8 g/dL (ref 3.5–5.0)
Alkaline Phosphatase: 50 U/L (ref 38–126)
Anion gap: 8 (ref 5–15)
BUN: 17 mg/dL (ref 6–20)
CO2: 25 mmol/L (ref 22–32)
Calcium: 9.2 mg/dL (ref 8.9–10.3)
Chloride: 105 mmol/L (ref 98–111)
Creatinine, Ser: 0.92 mg/dL (ref 0.44–1.00)
GFR, Estimated: >60 mL/min (ref >60)
Glucose, Bld: 97 mg/dL (ref 70–99)
Potassium: 4.2 mmol/L (ref 3.5–5.1)
Sodium: 138 mmol/L (ref 135–145)
Total Bilirubin: 1.3 mg/dL — ABNORMAL HIGH (ref 0.3–1.2)
Total Protein: 7.7 g/dL (ref 6.5–8.1)

## 2023-03-28 LAB — SEDIMENTATION RATE: Sed Rate: 23 mm/hr — ABNORMAL HIGH (ref 0–22)

## 2023-04-27 ENCOUNTER — Other Ambulatory Visit: Payer: Self-pay | Admitting: Internal Medicine

## 2023-04-27 DIAGNOSIS — M069 Rheumatoid arthritis, unspecified: Secondary | ICD-10-CM

## 2023-04-27 DIAGNOSIS — Z79899 Other long term (current) drug therapy: Secondary | ICD-10-CM

## 2023-04-28 NOTE — Telephone Encounter (Signed)
Last Fill: 03/24/2023 (30 day supply)  Labs: 03/28/2023 Total Bilirubin 1.3  TB Gold: 09/29/2022 Neg    Next Visit: 06/28/2023  Last Visit: 03/28/2023  DX: Seropositive rheumatoid arthritis   Current Dose per office note 03/28/2023: Cimzia 400 mg subcu monthly.   Okay to refill Cimzia?

## 2023-06-14 NOTE — Progress Notes (Signed)
Office Visit Note  Patient: Karina Murphy             Date of Birth: 1981/07/24           MRN: [id number]             PCP: Claiborne Rigg, NP Referring: Fuller Plan, MD Visit Date: 06/28/2023   Subjective:  Follow-up (Patient states some of her fingers hurt. Patient states the knuckle of her thumb in her left hand has been bothering her. )   History of Present Illness: Karina Murphy is a 42 y.o. female here for follow up for seropositive, erosive RA on Cimzia 400 mg Ginger Blue weekly.  Still has some persistent joint pain and stiffness most commonly MCP and PIP joints.  Worse on her left thumb recently sometimes with associated swelling or sometimes without change in appearance.  She had recent episode where her usual health was not available and had to self administer her Cimzia had some small raised bump and discoloration around the injection site for a few days.  Recent labs from October 4 with leukopenia to 3.2 with 1000 neutrophils otherwise normal.   Previous HPI 03/28/2023 Karina Murphy is a 42 y.o. 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 Since last visit her joint pain is doing well without prolonged morning stiffness. Left shoulder pain is partially better, but has some increased wrist pain near the base of the thumbs. Frequently worsened when lifting her child. No interval infections or antibiotics. Skin rashes are ongoing on her arms, tried topical steroid that was prescribed did not help much. Started using an OTC eczema cream that is helping partially.   Previous HPI 12/27/22 Karina Murphy is a 42 y.o. female here for follow up for seropositive RA after starting Cimzia currently finishing loading dose before transition to 400 mg subcu every 4 weeks.  Since her last visit she also discontinued the sulfasalazine due to not seeing clinical benefit from this.  So far symptoms are largely improved since  starting the Cimzia with decreased joint pain and swelling.  Still has some pain and has morning stiffness every day but with decreased severity.  Has not noticed any major flareup or exacerbation from stopping the sulfasalazine.  She had an episode of viral gastroenteritis in March that resolved without major complication but has had some ongoing lower abdominal pain afterwards.  Still having skin rashes most often around her hand and wrist or foot.  Also with some eye itching and redness consistent with seasonal allergy symptoms.   Previous HPI 09/27/22 Karina Murphy is a 42 y.o. 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 She delivered her baby last month uneventfully. For about 3 weeks notices new skin rashes on her right forearm and on both legs above the knees. This is itchy and bothers her most at night time.    Previous HPI 07/30/22 Karina Murphy is a 42 y.o. female here for seropositive RA. She was previously seeing rheumatology after onset of symptoms around 2014 with hand and foot inflammation initially.  She is also experienced involvement of bilateral shoulder pain and stiffness that started later.  She was on treatment but stopped following up after her rheumatologist left that practice in 2018.  Previous treatments including methotrexate and sulfasalazine and prednisone.  She has pretty much been off any maintenance medication since that time treatments have often consisted of intramuscular  steroid injections as needed for disease flareups.  She is getting evaluation now largely due to increased healthcare system exposure with her pregnancy.  Her arthritis symptoms have overall been doing better during this pregnancy compared to before.  She is not taking any medications regularly for pain.  Worst affected area at the moment is pain and swelling in her right foot.   This encounter was conducted with assistance of in person Spanish  language interpreter.   Review of Systems  Constitutional:  Positive for fatigue.  HENT:  Positive for mouth dryness. Negative for mouth sores.   Eyes:  Negative for dryness.  Respiratory:  Negative for shortness of breath.   Cardiovascular:  Negative for chest pain and palpitations.  Gastrointestinal:  Positive for constipation. Negative for blood in stool and diarrhea.  Endocrine: Positive for increased urination.  Genitourinary:  Negative for involuntary urination.  Musculoskeletal:  Negative for joint pain, gait problem, joint pain, joint swelling, myalgias, muscle weakness, morning stiffness, muscle tenderness and myalgias.  Skin:  Positive for rash, hair loss and sensitivity to sunlight. Negative for color change.  Allergic/Immunologic: Negative for susceptible to infections.  Neurological:  Positive for headaches. Negative for dizziness.  Hematological:  Negative for swollen glands.  Psychiatric/Behavioral:  Negative for depressed mood and sleep disturbance. The patient is nervous/anxious.     PMFS History:  Patient Active Problem List   Diagnosis Date Noted   Radial styloid tenosynovitis (de quervain) 03/28/2023   Allergic conjunctivitis 12/27/2022   High risk medication use 07/30/2022   Language barrier 03/31/2022   AMA (advanced maternal age) multigravida 35+ 02/04/2022   Rash and other nonspecific skin eruption 08/31/2016   Seropositive rheumatoid arthritis (HCC) 02/12/2014   Obese 12/26/2013    Past Medical History:  Diagnosis Date   Anxiety    Arthritis, rheumatoid (HCC)    Breast pain, left 09/17/2016   Chronic headaches    GERD (gastroesophageal reflux disease)    Hypertension    Obesity    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   Urticaria    Weakness 11/23/2016    Family History  Problem Relation Age of Onset   Diabetes Mother    Hypertension Mother    Diabetes Brother    Stomach cancer Neg Hx    Colon cancer Neg Hx    Esophageal cancer Neg Hx     Pancreatic cancer Neg Hx    Asthma Neg Hx    Cancer Neg Hx    Heart disease Neg Hx    Stroke Neg Hx    Past Surgical History:  Procedure Laterality Date   CHOLECYSTECTOMY  07/2020   Social History   Social History Narrative   Completed 2nd grade.    Immunization History  Administered Date(s) Administered   Influenza,inj,Quad PF,6+ Mos 06/28/2014, 09/17/2016, 06/14/2022   Tdap 06/14/2022     Objective: Vital Signs: BP 113/76 (BP Location: Left Arm, Patient Position: Sitting, Cuff Size: Normal)   Pulse 65   Resp 12   Ht 5\' 3"  (1.6 m)   Wt 189 lb (85.7 kg)   Breastfeeding No   BMI 33.48 kg/m    Physical Exam Cardiovascular:     Rate and Rhythm: Normal rate and regular rhythm.  Pulmonary:     Effort: Pulmonary effort is normal.     Breath sounds: Normal breath sounds.  Musculoskeletal:     Right lower leg: No edema.     Left lower leg: No edema.  Skin:    General:  Skin is warm and dry.     Findings: Rash present.     Comments: Flat patchy and macular hyperpigmented rash on right forearm extensor surface  Neurological:     Mental Status: She is alert.  Psychiatric:        Mood and Affect: Mood normal.      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  Imaging: No results found.  Recent Labs: Lab Results  Component Value Date   WBC 3.2 (L) 06/17/2023   HGB 11.9 06/17/2023   PLT 274 06/17/2023   NA 140 06/17/2023   K 4.1 06/17/2023   CL 102 06/17/2023   CO2 23 06/17/2023   GLUCOSE 103 (H) 06/17/2023   BUN 14 06/17/2023    CREATININE 0.92 06/17/2023   BILITOT 0.7 06/17/2023   ALKPHOS 68 06/17/2023   AST 14 06/17/2023   ALT 12 06/17/2023   PROT 7.6 06/17/2023   ALBUMIN 4.3 06/17/2023   CALCIUM 9.6 06/17/2023   GFRAA 129 03/19/2020   QFTBGOLDPLUS Negative 09/29/2022    Speciality Comments: Cimzia started 11/01/2022  Procedures:  No procedures performed Allergies: Bee pollen, Pineapple, and Morphine   Assessment / Plan:     Visit Diagnoses: Seropositive rheumatoid arthritis (HCC) - Plan: Ambulatory referral to Occupational Therapy  Joint inflammation appears to be well-controlled with little or no active synovitis decreased range of motion hands related to chronic appearing damage.  I discussed this is not typically a good candidate for surgical correction and some damage is irreversible.  Will refer to occupational therapy for evaluation and any recommended activity modification or range of motion exercise.  Otherwise plan to continue Cimzia 400 mg subcu monthly.  High risk medication use - Cimzia 400 mg subcu monthly.  Recent CBC and CMP reviewed with the patient.  The neutropenia at 1000 is still pretty low but is better than 600 on her previous test.  She is not having serious interval infections and seeing very good response so we will plan to continue the Cimzia with no interruption just continue monitoring for this.  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  Okay to continue monitoring.  Can try use of topical steroid or other product as needed if itching or discomfort.  Orders: Orders Placed This Encounter  Procedures   Ambulatory referral to Occupational Therapy   No orders of the defined types were placed in this encounter.    Follow-Up Instructions: Return in about 3 months (around 09/28/2023) for RA on CIM f/u 3mos.   Fuller Plan, MD  Note - This  record has been created using [employer].  Chart creation errors have been sought, but may not always  have been located. Such creation errors do not reflect on  the standard of medical care.

## 2023-06-17 ENCOUNTER — Other Ambulatory Visit: Payer: Self-pay

## 2023-06-17 ENCOUNTER — Ambulatory Visit: Payer: Self-pay | Attending: Nurse Practitioner | Admitting: Nurse Practitioner

## 2023-06-17 ENCOUNTER — Encounter: Payer: Self-pay | Admitting: Nurse Practitioner

## 2023-06-17 VITALS — BP 115/76 | HR 65 | Ht 63.0 in | Wt 190.2 lb

## 2023-06-17 DIAGNOSIS — N644 Mastodynia: Secondary | ICD-10-CM

## 2023-06-17 DIAGNOSIS — R112 Nausea with vomiting, unspecified: Secondary | ICD-10-CM

## 2023-06-17 DIAGNOSIS — K089 Disorder of teeth and supporting structures, unspecified: Secondary | ICD-10-CM

## 2023-06-17 DIAGNOSIS — K219 Gastro-esophageal reflux disease without esophagitis: Secondary | ICD-10-CM

## 2023-06-17 DIAGNOSIS — R1013 Epigastric pain: Secondary | ICD-10-CM

## 2023-06-17 DIAGNOSIS — N946 Dysmenorrhea, unspecified: Secondary | ICD-10-CM

## 2023-06-17 MED ORDER — ONDANSETRON 4 MG PO TBDP
4.0000 mg | ORAL_TABLET | Freq: Three times a day (TID) | ORAL | 1 refills | Status: DC | PRN
  Filled 2023-06-17: qty 30, 10d supply, fill #0
  Filled 2023-11-25: qty 30, 10d supply, fill #1

## 2023-06-17 MED ORDER — PANTOPRAZOLE SODIUM 40 MG PO TBEC
40.0000 mg | DELAYED_RELEASE_TABLET | Freq: Every day | ORAL | 1 refills | Status: DC
  Filled 2023-06-17: qty 90, 90d supply, fill #0
  Filled 2023-11-25 (×2): qty 90, 90d supply, fill #1

## 2023-06-17 NOTE — Progress Notes (Signed)
Assessment & Plan:  Karina Murphy was seen today for medical management of chronic issues.  Diagnoses and all orders for this visit:  Nausea and vomiting, unspecified vomiting type -     ondansetron (ZOFRAN-ODT) 4 MG disintegrating tablet; Take 1 tablet (4 mg total) by mouth every 8 (eight) hours as needed for nausea or vomiting. -     Ambulatory referral to Gastroenterology  Breast pain, left -     MS 3D DIAG MAMMO BILAT BR (aka MM); Future  Poor dentition -     Ambulatory referral to Dentistry  Epigastric pain -     pantoprazole (PROTONIX) 40 MG tablet; Take 1 tablet (40 mg total) by mouth daily. For heartburn or stomach pain -     CMP14+EGFR -     CBC with Differential -     Ambulatory referral to Gastroenterology    Patient has been counseled on age-appropriate routine health concerns for screening and prevention. These are reviewed and up-to-date. Referrals have been placed accordingly. Immunizations are up-to-date or declined.    Subjective:   Chief Complaint  Patient presents with   Medical Management of Chronic Issues    Nausea and abdominal pain Boil on left breast Irregular bleeding.   HPI Karina Murphy 42 y.o. female presents to office today with concerns of  AUB, left pelvic pain with menstruation only and GERD symptoms.   Notes epigastric pain and vomiting after eating over the past few months. Denies hematemesis.   AUB She stopped breast feeding 1 month ago. Questions irregular periods. She was instructed it may take several months for her menstrual cycles to return to normal as she just recently stopped breastfeeding.    She has several areas of what feels to be cysts throughout the left breast. Reports tenderness with palpation.   Review of Systems  Constitutional:  Negative for fever, malaise/fatigue and weight loss.  HENT: Negative.  Negative for nosebleeds.   Eyes: Negative.  Negative for blurred vision, double vision and photophobia.   Respiratory: Negative.  Negative for cough and shortness of breath.   Cardiovascular: Negative.  Negative for chest pain, palpitations and leg swelling.  Gastrointestinal:  Positive for heartburn, nausea and vomiting. Negative for abdominal pain, blood in stool, constipation, diarrhea and melena.  Musculoskeletal: Negative.  Negative for myalgias.  Neurological: Negative.  Negative for dizziness, focal weakness, seizures and headaches.  Psychiatric/Behavioral: Negative.  Negative for suicidal ideas.     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   Outpatient Medications Prior to Visit  Medication Sig Dispense Refill   CIMZIA,  2 SYRINGE, 200 MG/ML prefilled syringe Inject 2 syringes (400mg ) under skin every four weeks as directed. Refrigerate. Protect from light. 3 each 0   ibuprofen (ADVIL) 600 MG tablet Take 1 tablet (600 mg total) by mouth every 6 (six) hours. 30 tablet 0   Prenatal Vit-Fe Fumarate-FA (PREPLUS) 27-1 MG TABS Take 1 tablet by mouth daily. (Patient not taking: Reported on 06/28/2023) 90 tablet 3   triamcinolone ointment (KENALOG) 0.5 % Apply 1 Application topically 2 (two) times daily as needed. 30 g 0   Omega-3 Fatty Acids (OMEGA 3 PO) Take by mouth.     norethindrone (MICRONOR) 0.35 MG tablet  Take 1 tablet (0.35 mg total) by mouth daily. (Patient not taking: Reported on 12/27/2022) 28 tablet 11   ondansetron (ZOFRAN-ODT) 4 MG disintegrating tablet Take 1 tablet (4 mg total) by mouth every 8 (eight) hours as needed for nausea or vomiting. (Patient not taking: Reported on 12/27/2022) 20 tablet 0   No facility-administered medications prior to visit.    Allergies  Allergen Reactions   Bee Pollen    Pineapple Itching   Morphine Palpitations       Objective:    BP 115/76   Pulse 65   Ht 5\' 3"  (1.6 m)   Wt 190 lb 3.2 oz (86.3 kg)   SpO2 100%   Breastfeeding No   BMI 33.69 kg/m  Wt Readings from Last 3 Encounters:  06/28/23 189 lb (85.7 kg)  06/17/23 190 lb 3.2 oz (86.3 kg)  03/28/23 185 lb (83.9 kg)    Physical Exam Vitals and nursing note reviewed.  Constitutional:      Appearance: She is well-developed.  HENT:     Head: Normocephalic and atraumatic.  Cardiovascular:     Rate and Rhythm: Normal rate and regular rhythm.     Heart sounds: Normal heart sounds. No murmur heard.    No friction rub. No gallop.  Pulmonary:     Effort: Pulmonary effort is normal. No tachypnea or respiratory distress.     Breath sounds: Normal breath sounds. No decreased breath sounds, wheezing, rhonchi or rales.  Chest:     Chest wall: No tenderness.  Breasts:    Right: Mass present. No swelling, bleeding, inverted nipple, nipple discharge, skin change or tenderness.     Left: Mass and tenderness present. No swelling, bleeding, inverted nipple, nipple discharge or skin change.  Abdominal:     General: Bowel sounds are normal.     Palpations: Abdomen is soft.  Musculoskeletal:        General: Normal range of motion.     Cervical back: Normal range of motion.  Skin:    General: Skin is warm and dry.  Neurological:     Mental Status: She is alert and oriented to person, place, and time.     Coordination: Coordination normal.  Psychiatric:        Behavior: Behavior normal.  Behavior is cooperative.        Thought Content: Thought content normal.        Judgment: Judgment normal.          Patient has been counseled extensively about nutrition and exercise as well as the importance of adherence with medications and regular follow-up. The patient was given clear instructions to go to ER or return to medical center if symptoms don't improve, worsen or new problems develop. The patient verbalized understanding.   Follow-up: Return if symptoms worsen or fail to improve.   Shea Stakes  Meredeth Ide, FNP-BC [facility] [place], [place] [phone]   07/05/2023, 10:16 PM

## 2023-06-18 LAB — CBC WITH DIFFERENTIAL/PLATELET
Basophils Absolute: 0 10*3/uL (ref 0.0–0.2)
Basos: 1 %
EOS (ABSOLUTE): 0.1 10*3/uL (ref 0.0–0.4)
Eos: 4 %
Hematocrit: 38.6 % (ref 34.0–46.6)
Hemoglobin: 11.9 g/dL (ref 11.1–15.9)
Immature Grans (Abs): 0 10*3/uL (ref 0.0–0.1)
Immature Granulocytes: 0 %
Lymphocytes Absolute: 1.7 10*3/uL (ref 0.7–3.1)
Lymphs: 52 %
MCH: 26.1 pg — ABNORMAL LOW (ref 26.6–33.0)
MCHC: 30.8 g/dL — ABNORMAL LOW (ref 31.5–35.7)
MCV: 85 fL (ref 79–97)
Monocytes Absolute: 0.4 10*3/uL (ref 0.1–0.9)
Monocytes: 12 %
Neutrophils Absolute: 1 10*3/uL — ABNORMAL LOW (ref 1.4–7.0)
Neutrophils: 31 %
Platelets: 274 10*3/uL (ref 150–450)
RBC: 4.56 x10E6/uL (ref 3.77–5.28)
RDW: 14.2 % (ref 11.7–15.4)
WBC: 3.2 10*3/uL — ABNORMAL LOW (ref 3.4–10.8)

## 2023-06-18 LAB — CMP14+EGFR
ALT: 12 [IU]/L (ref 0–32)
AST: 14 [IU]/L (ref 0–40)
Albumin: 4.3 g/dL (ref 3.9–4.9)
Alkaline Phosphatase: 68 [IU]/L (ref 44–121)
BUN/Creatinine Ratio: 15 (ref 9–23)
BUN: 14 mg/dL (ref 6–24)
Bilirubin Total: 0.7 mg/dL (ref 0.0–1.2)
CO2: 23 mmol/L (ref 20–29)
Calcium: 9.6 mg/dL (ref 8.7–10.2)
Chloride: 102 mmol/L (ref 96–106)
Creatinine, Ser: 0.92 mg/dL (ref 0.57–1.00)
Globulin, Total: 3.3 g/dL (ref 1.5–4.5)
Glucose: 103 mg/dL — ABNORMAL HIGH (ref 70–99)
Potassium: 4.1 mmol/L (ref 3.5–5.2)
Sodium: 140 mmol/L (ref 134–144)
Total Protein: 7.6 g/dL (ref 6.0–8.5)
eGFR: 81 mL/min/{1.73_m2} (ref >59)

## 2023-06-20 ENCOUNTER — Other Ambulatory Visit: Payer: Self-pay | Admitting: Nurse Practitioner

## 2023-06-20 ENCOUNTER — Telehealth: Payer: Self-pay | Admitting: Nurse Practitioner

## 2023-06-20 ENCOUNTER — Other Ambulatory Visit: Payer: Self-pay

## 2023-06-20 DIAGNOSIS — N644 Mastodynia: Secondary | ICD-10-CM

## 2023-06-20 NOTE — Telephone Encounter (Signed)
Copied from CRM [id number]. Topic: General - Other >> Jun 20, 2023  2:34 PM Santiya F wrote: Reason for CRM: Pt is calling in because she was scheduled for a mammogram but pt doesn't have insurance and couldn't have it done due to cost. Pt says is there another place she can get the mammogram done that will accept the financial assistance she receives from the clinic. Please follow up with pt.

## 2023-06-22 NOTE — Telephone Encounter (Signed)
Called patient to inform her that they will be calling her to make an appointment [facility].

## 2023-06-24 ENCOUNTER — Other Ambulatory Visit: Payer: Self-pay

## 2023-06-28 ENCOUNTER — Encounter: Payer: Self-pay | Admitting: Internal Medicine

## 2023-06-28 ENCOUNTER — Ambulatory Visit: Payer: Self-pay | Attending: Internal Medicine | Admitting: Internal Medicine

## 2023-06-28 VITALS — BP 113/76 | HR 65 | Resp 12 | Ht 63.0 in | Wt 189.0 lb

## 2023-06-28 DIAGNOSIS — Z79899 Other long term (current) drug therapy: Secondary | ICD-10-CM

## 2023-06-28 DIAGNOSIS — R21 Rash and other nonspecific skin eruption: Secondary | ICD-10-CM

## 2023-06-28 DIAGNOSIS — M069 Rheumatoid arthritis, unspecified: Secondary | ICD-10-CM

## 2023-06-28 DIAGNOSIS — M059 Rheumatoid arthritis with rheumatoid factor, unspecified: Secondary | ICD-10-CM

## 2023-06-28 DIAGNOSIS — M654 Radial styloid tenosynovitis [de Quervain]: Secondary | ICD-10-CM

## 2023-07-05 ENCOUNTER — Encounter: Payer: Self-pay | Admitting: Nurse Practitioner

## 2023-07-06 NOTE — Therapy (Signed)
OUTPATIENT OCCUPATIONAL THERAPY ORTHO EVALUATION  Patient Name: Karina Murphy MRN: [id number] DOB:08/05/1981, 42 y.o., female Today's Date: 07/07/2023  PCP: [name], NP REFERRING PROVIDER:  Fuller Plan, MD    END OF SESSION:  OT End of Session - 07/07/23 1003     Visit Number 1    Number of Visits 6    Date for OT Re-Evaluation 08/19/23    Authorization Type N/A    OT Start Time 1011    OT Stop Time 1113    OT Time Calculation (min) 62 min    Equipment Utilized During Treatment oval-8 splints, coban roll    Activity Tolerance Patient tolerated treatment well;No increased pain;Patient limited by fatigue;Patient limited by pain    Behavior During Therapy [facility] for tasks assessed/performed             Past Medical History:  Diagnosis Date   Anxiety    Arthritis, rheumatoid (HCC)    Breast pain, left 09/17/2016   Chronic headaches    GERD (gastroesophageal reflux disease)    Hypertension    Obesity    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   Urticaria    Weakness 11/23/2016   Past Surgical History:  Procedure Laterality Date   CHOLECYSTECTOMY  07/2020   Patient Active Problem List   Diagnosis Date Noted   Radial styloid tenosynovitis (de quervain) 03/28/2023   Allergic conjunctivitis 12/27/2022   High risk medication use 07/30/2022   Language barrier 03/31/2022   AMA (advanced maternal age) multigravida 35+ 02/04/2022   Rash and other nonspecific skin eruption 08/31/2016   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  She has been having finger pain mainly in the right hand index and middle fingers, and also locking of the PIP joints into extension.  Her hands are stiff, she has difficulty making a fist or opening up her hands the right way.  She is unsure what to do about it but she has been seeing a rheumatologist.  Pt accompanied by: interpreter: Alba  PERTINENT HISTORY: Pre referral: "Evaluate and recommendation for any HEP or supportive devices - hand deformities 2/2 rheumatoid arthritis "   PRECAUTIONS: None  RED FLAGS: None   WEIGHT BEARING RESTRICTIONS: No  PAIN:  Are you having pain? Yes: NPRS scale: 2-3/10 Pain location: Right index finger PIP and middle finger PIP joints Pain description: Aching at times Aggravating factors: Repetitive motion Relieving factors: Rest  FALLS: Has patient fallen in last 6 months? No  LIVING ENVIRONMENT: Lives with: lives with their spouse  PLOF: Independent  PATIENT GOALS: To improve the use of her hands and also decrease pain if possible   OBJECTIVE: (All objective assessments below are from initial evaluation on: 07/07/23 unless otherwise specified.)   HAND DOMINANCE: Right   ADLs: Overall ADLs: States decreased ability to grab, hold household objects, pain and difficulty to open containers, perform FMS tasks (manipulate fasteners on clothing)   FUNCTIONAL OUTCOME MEASURES: Eval: Quick DASH  TBD% impairment today  (Higher % Score  =  More Impairment)     UPPER EXTREMITY ROM     Shoulder to Wrist AROM Right eval Left eval  Forearm supination stiff WFL  Forearm pronation  stiff WFL  Wrist flexion 61 65  Wrist extension 55 63  (Blank rows = not tested)   Hand AROM Right eval Left eval  Full Fist Ability (or Gap to Distal Palmar Crease) Very stiff tips of fingers barely touch  palm WFL but loose   Thumb Opposition  (Kapandji Scale)  6/10 8/10  (Blank rows = not tested)   HAND FUNCTION: Eval: Observed weakness in affected bil hand.  Grip strength Right: 31 lbs, Left: 33 lbs   COORDINATION: Eval: Observed coordination impairments with affected bil hand. 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  This seems somewhat chronic as they are rigidly in extension and OT has a difficult time pushing them into flexion.  The lateral bands can be felt and seen snapping backwards and forwards with these motions.  She is passively flexible for the most part.  Fortunately both thumbs seem to be within functional limits and no significant deformity or pain.  She does have some pain and stiffness to the right forearm as well as the right wrist.   TODAY'S TREATMENT:  Post-evaluation treatment:   OT starts with self-care/safety education to try to decrease her stress levels and decrease the rigidity with which she holds her hands open.  She was educated that if she repetitively extends her fingers will hold slow and extended in a nervous way, this will complicate her problems.  Forceful and repetitive extension will continue to breakdown the PIP joints and create a clawing deformity.  On the other hand she should focus on flexing and stretching her fingers into a fist.  OT supplies her with Coban wrapping to lightly wrapped her hands in a fist at night to help stretch her tight extensors.  OT does this with her and shows her how to do it which does not bother her or cause her pain today.  Next, OT provides her with a home exercise program listed below which was quickly demonstrated to her due to a short time for  treatment today.  Lastly, OT custom fabricates 8 different "oval 8 splints" to be worn on the PIP joints of digits 2 through 5 of both hands at all times during the day and to consider wearing at night as well.  OT has to custom molded these with a heat gun and custom fit these to her to achieve good functional results.  She was educated to take these off if they are rubbing her or hurting her in any way, or just take them off periodically to "let her skin breathe."  When they are not on, she should be trying to keep her hand into a fist or generally preventing extension at the joints.  She states understanding this to a certain degree, though this will likely need reviewed in upcoming sessions.  There are biotics might also need slight adjustment as her fingers accommodate to these new positions over time.  When these oval 8 splints were  on, she has no significant hyperextension at any of her joints and has a easier time flexing her fingers.  Exercises - Forearm Supination Stretch  - 3-4 x daily - 3-5 reps - 15 sec hold - Forearm Pronation Stretch  - 3-4 x daily - 3-5 reps - 15 sec hold - Wrist Flexion Stretch  - 4 x daily - 3-5 reps - 15 sec hold - Seated Wrist Extension Stretch  - 3-6 x daily - 3-5 reps - 15 hold - BACK KNUCKLE STRETCHES   - 4 x daily - 3-5 reps - 15 sec hold - HOOK Stretch  - 4 x daily - 3-5 reps - 15-20 sec hold - Seated Finger Composite Flexion Stretch  - 4 x daily - 3-5 reps - 15 hold - Tendon Glides  - 4-6 x daily - 3-5 reps - 2-3 seconds hold   PATIENT EDUCATION: Education details: See tx section above for details  Person educated: Patient Education method: Engineer, structural, Teach back, Handouts  Education comprehension: States and demonstrates understanding, Additional Education required    HOME EXERCISE PROGRAM: Access Code: [facility] URL: [email or web address] Date: 07/07/2023 Prepared by: Fannie Knee    GOALS: Goals reviewed with patient?  Yes   SHORT TERM GOALS: (STG required if POC>30 days) Target Date: 07/22/23  Pt will obtain protective, custom orthotic. Goal status: 07/07/23: MET, but may need adjustments   2.  Pt will demo/state understanding of initial HEP to improve pain levels and prerequisite motion. Goal status: INITIAL   LONG TERM GOALS: Target Date: 08/19/23  Pt will improve functional ability by decreased impairment per Quick DASH assessment from TBD (when time allows) to <15% or better, for better quality of life. Goal status: INITIAL  2.  Pt will improve grip strength in bil hands from approx 30lbs to at least 45lbs for functional use at home and in IADLs. Goal status: INITIAL  3.  Pt will improve A/ROM in bil hands/fingers from unable to make a complete fist to at least making a full fist, to have functional motion for tasks like reach and grasp.  Goal status: INITIAL  4.  Pt will decrease pain with motion from 2-3/10 to 1/10 or better to have better sleep and occupational participation in daily roles. Goal status: INITIAL  ASSESSMENT:  CLINICAL IMPRESSION: Patient is a 42 y.o. female who was seen today for occupational therapy evaluation for stiffness, weakness, pain and deformities in bilateral hands from the rheumatism as well as anxiety and tension in hands and arms.  These things are making her daily activities painful and more more difficult as time goes by.  She will benefit from outpatient occupational therapy to try to stop this trend, decrease deformities, and increase function.Marland Kitchen   PERFORMANCE DEFICITS: in functional skills including ADLs, IADLs, coordination, dexterity, ROM, strength, pain, fascial restrictions, muscle spasms, flexibility, Fine motor control, body mechanics, decreased knowledge of precautions, decreased knowledge of use of DME, and UE functional use, cognitive skills including problem solving and safety awareness, and psychosocial skills including coping strategies,  environmental adaptation, and habits.   IMPAIRMENTS: are limiting patient from ADLs, IADLs, leisure, and social participation.   COMORBIDITIES: may have co-morbidities  that affects occupational performance. Patient will benefit from skilled OT to address above impairments and improve overall function.  MODIFICATION OR ASSISTANCE TO COMPLETE EVALUATION: No modification of tasks or assist necessary to complete an evaluation.  OT OCCUPATIONAL PROFILE AND HISTORY: Problem focused assessment: Including review of records relating to presenting problem.  CLINICAL DECISION MAKING: LOW - limited treatment options, no task modification necessary  REHAB POTENTIAL: Good  EVALUATION COMPLEXITY: Low      PLAN:  OT FREQUENCY: 1x/week  OT DURATION: 6 weeks through 08/19/23 and up to 6 visits   PLANNED INTERVENTIONS: 97168 OT Re-evaluation, 97535 self care/ADL training, 16109 therapeutic exercise, 97530 therapeutic activity, 97140 manual therapy, 97039 fluidotherapy, 97010 moist heat, 97010 cryotherapy, 97034 contrast bath, 97760 Orthotics management and training, 60454 Splinting (initial encounter), [id number] Subsequent splinting/medication, passive range of motion, compression bandaging, energy conservation, coping strategies training, patient/family education, and DME and/or AE instructions  RECOMMENDED OTHER SERVICES: none now   CONSULTED AND AGREED WITH PLAN OF CARE: Patient  PLAN FOR NEXT SESSION:   Review home exercise program, custom oval 8 splints, wrapping hand in the night, and overall prevention of continued deformity.  Consider adding grip training if tolerated.   Fannie Knee, OTR/L ,CHT 07/07/2023, 1:53 PM

## 2023-07-07 ENCOUNTER — Encounter: Payer: Self-pay | Admitting: Rehabilitative and Restorative Service Providers"

## 2023-07-07 ENCOUNTER — Other Ambulatory Visit: Payer: Self-pay

## 2023-07-07 ENCOUNTER — Ambulatory Visit (INDEPENDENT_AMBULATORY_CARE_PROVIDER_SITE_OTHER): Payer: Self-pay | Admitting: Rehabilitative and Restorative Service Providers"

## 2023-07-07 DIAGNOSIS — M79641 Pain in right hand: Secondary | ICD-10-CM

## 2023-07-07 DIAGNOSIS — M25641 Stiffness of right hand, not elsewhere classified: Secondary | ICD-10-CM

## 2023-07-07 DIAGNOSIS — M25642 Stiffness of left hand, not elsewhere classified: Secondary | ICD-10-CM

## 2023-07-07 DIAGNOSIS — M6281 Muscle weakness (generalized): Secondary | ICD-10-CM

## 2023-07-07 DIAGNOSIS — R278 Other lack of coordination: Secondary | ICD-10-CM

## 2023-07-14 ENCOUNTER — Encounter: Payer: Self-pay | Admitting: Rehabilitative and Restorative Service Providers"

## 2023-07-14 ENCOUNTER — Ambulatory Visit (INDEPENDENT_AMBULATORY_CARE_PROVIDER_SITE_OTHER): Payer: Self-pay | Admitting: Rehabilitative and Restorative Service Providers"

## 2023-07-14 DIAGNOSIS — M79641 Pain in right hand: Secondary | ICD-10-CM

## 2023-07-14 DIAGNOSIS — R278 Other lack of coordination: Secondary | ICD-10-CM

## 2023-07-14 DIAGNOSIS — M25642 Stiffness of left hand, not elsewhere classified: Secondary | ICD-10-CM

## 2023-07-14 DIAGNOSIS — M6281 Muscle weakness (generalized): Secondary | ICD-10-CM

## 2023-07-14 DIAGNOSIS — M25641 Stiffness of right hand, not elsewhere classified: Secondary | ICD-10-CM

## 2023-07-14 NOTE — Therapy (Signed)
OUTPATIENT OCCUPATIONAL THERAPY TREATMENT NOTE  Patient Name: Karina Murphy MRN: [id number] DOB:03/06/1981, 42 y.o., female Today's Date: 07/14/2023  PCP: [name], NP REFERRING PROVIDER:  Fuller Plan, MD    END OF SESSION:  OT End of Session - 07/14/23 1146     Visit Number 2    Number of Visits 6    Date for OT Re-Evaluation 08/19/23    Authorization Type N/A    OT Start Time 1146    OT Stop Time 1239    OT Time Calculation (min) 53 min    Equipment Utilized During Treatment orthotic materials    Activity Tolerance Patient tolerated treatment well;No increased pain;Patient limited by fatigue;Patient limited by pain    Behavior During Therapy [facility] for tasks assessed/performed              Past Medical History:  Diagnosis Date   Anxiety    Arthritis, rheumatoid (HCC)    Breast pain, left 09/17/2016   Chronic headaches    GERD (gastroesophageal reflux disease)    Hypertension    Obesity    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   Urticaria    Weakness 11/23/2016   Past Surgical History:  Procedure Laterality Date   CHOLECYSTECTOMY  07/2020   Patient Active Problem List   Diagnosis Date Noted   Radial styloid tenosynovitis (de quervain) 03/28/2023   Allergic conjunctivitis 12/27/2022   High risk medication use 07/30/2022   Language barrier 03/31/2022   AMA (advanced maternal age) multigravida 35+ 02/04/2022   Rash and other nonspecific skin eruption 08/31/2016   Seropositive rheumatoid arthritis (HCC) 02/12/2014   Obese 12/26/2013    ONSET DATE: Chronic, insidious onset   REFERRING DIAG: M05.9 (ICD-10-CM) - Seropositive rheumatoid arthritis (HCC)   THERAPY DIAG:  Stiffness of left hand, not elsewhere classified  Stiffness of right hand, not elsewhere classified  Other lack of coordination  Pain in right hand  Muscle weakness (generalized)  Rationale for Evaluation and Treatment: Rehabilitation  PERTINENT HISTORY: Pre  referral: "Evaluate and recommendation for any HEP or supportive devices - hand deformities 2/2 rheumatoid arthritis "  She states (through interpreter): She was diagnosed with rheumatoid arthritis 2 to 3 years ago and has been taking medications for it.  She has been having finger pain mainly in the right hand index and middle fingers, and also locking of the PIP joints into extension.  Her hands are stiff, she has difficulty making a fist or opening up her hands the right way.  She is unsure what to do about it but she has been seeing a rheumatologist.  PRECAUTIONS: None; RED FLAGS: None;  WEIGHT BEARING RESTRICTIONS: No    SUBJECTIVE:   SUBJECTIVE STATEMENT: She states tolerating Oval-8's, removing them at times to help her baby, trying sleeping with hand wrapped without pain, but only 2x due to needing to help her baby.    Pt accompanied by: interpreter: Byrd Hesselbach    PAIN:  Are you having pain? Yes: NPRS scale: 0/10 Pain location: Right index finger PIP and middle finger PIP joints Pain description: Aching at times Aggravating factors: Repetitive motion Relieving factors: Rest   PATIENT GOALS: To improve the use of her hands and also decrease pain if possible    OBJECTIVE: (All objective assessments below are from initial evaluation on: 07/07/23 unless otherwise specified.)   HAND DOMINANCE: Right   ADLs: Overall ADLs: States decreased ability to grab, hold household objects, pain and difficulty to open containers, perform FMS tasks (  manipulate fasteners on clothing)   FUNCTIONAL OUTCOME MEASURES: 07/14/23: Quick DASH 16% impairment today  (Higher % Score  =  More Impairment)     UPPER EXTREMITY ROM     Shoulder to Wrist AROM Right eval Left eval  Forearm supination stiff WFL  Forearm pronation  stiff WFL  Wrist flexion 61 65  Wrist extension 55 63  (Blank rows = not tested)   Hand AROM Right eval Left eval  Full Fist Ability (or Gap to Distal Palmar Crease)  Very stiff tips of fingers barely touch palm WFL but loose   Thumb Opposition  (Kapandji Scale)  6/10 8/10  (Blank rows = not tested)   HAND FUNCTION: Eval: Observed weakness in affected bil hand.  Grip strength Right: 31 lbs, Left: 33 lbs   COORDINATION: Eval: Observed coordination impairments with affected bil hand. 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  This seems somewhat chronic as they are rigidly in extension and OT has a difficult time pushing them into flexion.  The lateral bands can be felt and seen snapping backwards and forwards with these motions.  She is passively flexible for the most part.  Fortunately both thumbs seem to be within functional limits and no significant deformity or pain.  She does have some pain and stiffness to the right forearm as well as the right wrist.   TODAY'S TREATMENT:  07/14/23: She starts by discussing home and functional activities with OT and interpreter (fills out Quick DASH). OT makes some orthotic adjustments to oval 8's to improve PIP J bend, especially in more powerful middle and index fingers. Her home exercises were reviewed in more detail today and she was also given pink therapy putty and edu on grip training and pinch training activities.  She has difficulty with claw fist into putty and was also shown how to perform "pencil push-ups" to help extend MP joint while keeping IP joints flexed.  This will help correct the imbalance going on in her tendons.  Additionally in the left thumb she has some pain and inability to extend at the MP joint.  She states this hand is painful when gripping and doing activities at home compared to the right hand and thumb.  OT custom fabricates and MP extension left hand and thumb orthosis that wraps around her hand and straps around the palm.  She should wear this during the  day with activities that normally bother her thumb to prevent flexion and pain.  She tries it today picking up several objects and states that it helpful and helps eliminate pain in her thumb.  She should remove it if it is causing pain or problems or irritation.  Even her finger braces she should remove if they were rubbing her or irritating her skin, but she should try to not actively extend them much when they are off.  She tolerates all well today, leaves without pain.    Exercises - Forearm Supination Stretch  - 3-4 x daily - 3-5 reps - 15 sec hold - Forearm Pronation Stretch  - 3-4 x daily - 3-5 reps - 15 sec hold - Wrist Flexion Stretch  - 4 x daily - 3-5 reps - 15 sec hold - Seated Wrist Extension Stretch  - 3-6 x daily - 3-5 reps - 15 hold - BACK KNUCKLE  STRETCHES   - 4 x daily - 3-5 reps - 15 sec hold - HOOK Stretch  - 4 x daily - 3-5 reps - 15-20 sec hold - Seated Finger Composite Flexion Stretch  - 4 x daily - 3-5 reps - 15 hold - Tendon Glides  - 4-6 x daily - 3-5 reps - 2-3 seconds hold - Full Fist  - 2-3 x daily - 5 reps - Seated Claw Fist with Putty  - 2-3 x daily - 5 reps -"Pencil pushups" - Thumb Opposition with Putty  - 2-3 x daily - 5 reps   PATIENT EDUCATION: Education details: See tx section above for details  Person educated: Patient Education method: Verbal Instruction, Teach back, Handouts  Education comprehension: States and demonstrates understanding, Additional Education required    HOME EXERCISE PROGRAM: Access Code: [facility] URL: [email or web address] Date: 07/07/2023 Prepared by: Fannie Knee    GOALS: Goals reviewed with patient? Yes   SHORT TERM GOALS: (STG required if POC>30 days) Target Date: 07/22/23  Pt will obtain protective, custom orthotic. Goal status: 07/07/23: MET, but may need adjustments   2.  Pt will demo/state understanding of initial HEP to improve pain levels and prerequisite motion. Goal status:  INITIAL   LONG TERM GOALS: Target Date: 08/19/23  Pt will improve functional ability by decreased impairment per Quick DASH assessment from TBD (when time allows) to <15% or better, for better quality of life. Goal status: INITIAL  2.  Pt will improve grip strength in bil hands from approx 30lbs to at least 45lbs for functional use at home and in IADLs. Goal status: INITIAL  3.  Pt will improve A/ROM in bil hands/fingers from unable to make a complete fist to at least making a full fist, to have functional motion for tasks like reach and grasp.  Goal status: INITIAL  4.  Pt will decrease pain with motion from 2-3/10 to 1/10 or better to have better sleep and occupational participation in daily roles. Goal status: INITIAL  ASSESSMENT:  CLINICAL IMPRESSION: 07/14/23: She now has a custom thumb orthosis for the left hand as well and her finger orthoses are working well 2.  Exercises have been reviewed and added grip and pinch activities now.  Carry on   Eval: Patient is a 42 y.o. female who was seen today for occupational therapy evaluation for stiffness, weakness, pain and deformities in bilateral hands from the rheumatism as well as anxiety and tension in hands and arms.  These things are making her daily activities painful and more more difficult as time goes by.  She will benefit from outpatient occupational therapy to try to stop this trend, decrease deformities, and increase function.Marland Kitchen     PLAN:  OT FREQUENCY: 1x/week  OT DURATION: 6 weeks through 08/19/23 and up to 6 visits   PLANNED INTERVENTIONS: 97168 OT Re-evaluation, 97535 self care/ADL training, 16109 therapeutic exercise, 97530 therapeutic activity, 97140 manual therapy, 97039 fluidotherapy, 97010 moist heat, 97010 cryotherapy, 97034 contrast bath, 97760 Orthotics management and training, 60454 Splinting (initial encounter), [phone] Subsequent splinting/medication, passive range of motion, compression bandaging, energy  conservation, coping strategies training, patient/family education, and DME and/or AE instructions  CONSULTED AND AGREED WITH PLAN OF CARE: Patient  PLAN FOR NEXT SESSION:   Check new thumb orthosis and consider checking active range of motion for improvements now that she has been performing HEP for 2 weeks.  Fannie Knee, OTR/L ,CHT 07/14/2023, 12:46 PM

## 2023-07-18 NOTE — Therapy (Signed)
OUTPATIENT OCCUPATIONAL THERAPY TREATMENT NOTE  Patient Name: Karina Murphy MRN: [id number] DOB:08/18/1981, 42 y.o., female Today's Date: 07/21/2023  PCP: [name], NP REFERRING PROVIDER:  Fuller Plan, MD    END OF SESSION:  OT End of Session - 07/21/23 1019     Visit Number 3    Number of Visits 6    Date for OT Re-Evaluation 08/19/23    Authorization Type N/A    OT Start Time 1019    OT Stop Time 1051    OT Time Calculation (min) 32 min    Equipment Utilized During Treatment orthotic materials    Activity Tolerance Patient tolerated treatment well;No increased pain    Behavior During Therapy [facility] for tasks assessed/performed              Past Medical History:  Diagnosis Date   Anxiety    Arthritis, rheumatoid (HCC)    Breast pain, left 09/17/2016   Chronic headaches    GERD (gastroesophageal reflux disease)    Hypertension    Obesity    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   Urticaria    Weakness 11/23/2016   Past Surgical History:  Procedure Laterality Date   CHOLECYSTECTOMY  07/2020   Patient Active Problem List   Diagnosis Date Noted   Radial styloid tenosynovitis (de quervain) 03/28/2023   Allergic conjunctivitis 12/27/2022   High risk medication use 07/30/2022   Language barrier 03/31/2022   AMA (advanced maternal age) multigravida 35+ 02/04/2022   Rash and other nonspecific skin eruption 08/31/2016   Seropositive rheumatoid arthritis (HCC) 02/12/2014   Obese 12/26/2013    ONSET DATE: Chronic, insidious onset   REFERRING DIAG: M05.9 (ICD-10-CM) - Seropositive rheumatoid arthritis (HCC)   THERAPY DIAG:  Stiffness of left hand, not elsewhere classified  Stiffness of right hand, not elsewhere classified  Other lack of coordination  Pain in right hand  Muscle weakness (generalized)  Rationale for Evaluation and Treatment: Rehabilitation  PERTINENT HISTORY: Pre referral: "Evaluate and recommendation for any HEP or  supportive devices - hand deformities 2/2 rheumatoid arthritis "  She states (through interpreter): She was diagnosed with rheumatoid arthritis 2 to 3 years ago and has been taking medications for it.  She has been having finger pain mainly in the right hand index and middle fingers, and also locking of the PIP joints into extension.  Her hands are stiff, she has difficulty making a fist or opening up her hands the right way.  She is unsure what to do about it but she has been seeing a rheumatologist.  PRECAUTIONS: None; RED FLAGS: None;  WEIGHT BEARING RESTRICTIONS: No    SUBJECTIVE:   SUBJECTIVE STATEMENT: She states through her interpreter that her home activities have been going well, she has not had any new pains or problems, but her new thumb orthosis did cause swelling after she wore it "all day."  Pt accompanied by: interpreter     PAIN:  Are you having pain? Not now at rest    PATIENT GOALS: To improve the use of her hands and also decrease pain if possible    OBJECTIVE: (All objective assessments below are from initial evaluation on: 07/07/23 unless otherwise specified.)   HAND DOMINANCE: Right   ADLs: Overall ADLs: States decreased ability to grab, hold household objects, pain and difficulty to open containers, perform FMS tasks (manipulate fasteners on clothing)   FUNCTIONAL OUTCOME MEASURES: 07/14/23: Quick DASH 16% impairment today  (Higher % Score  =  More  Impairment)     UPPER EXTREMITY ROM     Shoulder to Wrist AROM Right eval Left eval Rt /Lt 07/21/23  Forearm supination stiff WFL 70 Rt  Forearm pronation  stiff WFL 85 Rt  Wrist flexion 61 65 59  /  65  Wrist extension 55 63 60 /  58  (Blank rows = not tested)   Hand AROM Right eval Left eval  Full Fist Ability (or Gap to Distal Palmar Crease) Very stiff tips of fingers barely touch palm WFL but loose   Thumb Opposition  (Kapandji Scale)  6/10 8/10  (Blank rows = not tested)   HAND  FUNCTION: 07/21/23: Grip: Rt: 40#; Lt: 34#  Eval: Observed weakness in affected bil hand.  Grip strength Right: 31 lbs, Left: 33 lbs   COORDINATION: Eval: Observed coordination impairments with affected bil hand. 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  This seems somewhat chronic as they are rigidly in extension and OT has a difficult time pushing them into flexion.  The lateral bands can be felt and seen snapping backwards and forwards with these motions.  She is passively flexible for the most part.  Fortunately both thumbs seem to be within functional limits and no significant deformity or pain.  She does have some pain and stiffness to the right forearm as well as the right wrist.   TODAY'S TREATMENT:  07/21/23: She starts by performing active range of motion for detailed measures today which do not show much significant change at the wrists yet, but her right hand grip strength is significantly up.  OT asks if her orthoses are all working well and she states that her new thumb orthosis caused a bit of swelling after wearing it "all day."  She is not wearing it today.  She was recommended again to only wear it during periods of activity when she has a lot of thumb use-not all day.  She should remove it for "rest breaks" when not doing anything, just watching TV or just relaxing and even sleeping.  Additionally she states that her left hand small finger oval 8 customized orthosis is too bulky and falling off at times, so OT custom fabricates an oval 8 out of orthotic material that is smaller and fits her better.  She states this works well but is advised to switch back to the other if this 1 is not working for what ever reason.  OT then goes over her home exercises with her including all stretches and active range of motion exercises.  Also new putty activities are  reviewed.  She states understanding these things.  Lastly, OT asks how her functional activities are going and she states no new problems and everything seems to be somewhat better.  Due to this we will allow her to perform these things on her own for 2 weeks and return with any new issues or problems for a progress note and to check goals.  She could return sooner if she is having any issues.   Exercises/Activities Reviewed - Forearm Supination Stretch  - 3-4 x daily - 3-5 reps - 15 sec hold - Forearm Pronation Stretch  - 3-4 x daily - 3-5 reps - 15 sec hold - Wrist Flexion Stretch  - 4 x daily - 3-5 reps - 15 sec hold - Seated  Wrist Extension Stretch  - 3-6 x daily - 3-5 reps - 15 hold - BACK KNUCKLE STRETCHES   - 4 x daily - 3-5 reps - 15 sec hold - HOOK Stretch  - 4 x daily - 3-5 reps - 15-20 sec hold - Seated Finger Composite Flexion Stretch  - 4 x daily - 3-5 reps - 15 hold - Tendon Glides  - 4-6 x daily - 3-5 reps - 2-3 seconds hold - Full Fist  - 2-3 x daily - 5 reps - Seated Claw Fist with Putty  - 2-3 x daily - 5 reps -"Pencil pushups" - Thumb Opposition with Putty  - 2-3 x daily - 5 reps   PATIENT EDUCATION: Education details: See tx section above for details  Person educated: Patient Education method: Verbal Instruction, Teach back, Handouts  Education comprehension: States and demonstrates understanding, Additional Education required    HOME EXERCISE PROGRAM: Access Code: [facility] URL: [email or web address] Date: 07/07/2023 Prepared by: Fannie Knee    GOALS: Goals reviewed with patient? Yes   SHORT TERM GOALS: (STG required if POC>30 days) Target Date: 07/22/23  Pt will obtain protective, custom orthotic. Goal status: 07/07/23: MET, but may need adjustments   2.  Pt will demo/state understanding of initial HEP to improve pain levels and prerequisite motion. Goal status: 07/21/23: MET   LONG TERM GOALS: Target Date: 08/19/23  Pt will  improve functional ability by decreased impairment per Quick DASH assessment from TBD (when time allows) to <15% or better, for better quality of life. Goal status: INITIAL  2.  Pt will improve grip strength in bil hands from approx 30lbs to at least 45lbs for functional use at home and in IADLs. Goal status: INITIAL  3.  Pt will improve A/ROM in bil hands/fingers from unable to make a complete fist to at least making a full fist, to have functional motion for tasks like reach and grasp.  Goal status: INITIAL  4.  Pt will decrease pain with motion from 2-3/10 to 1/10 or better to have better sleep and occupational participation in daily roles. Goal status: INITIAL  ASSESSMENT:  CLINICAL IMPRESSION: 07/21/23: Since she seems to have all needed orthoses as well as recommendations and exercises, she will perform these things and use these things for 2 weeks on her own and then return for a progress note and possible discharge if all goals are met.   PLAN:  OT FREQUENCY: 1x/week  OT DURATION: 6 weeks through 08/19/23 and up to 6 visits   PLANNED INTERVENTIONS: 97168 OT Re-evaluation, 97535 self care/ADL training, 40981 therapeutic exercise, 97530 therapeutic activity, 97140 manual therapy, 97039 fluidotherapy, 97010 moist heat, 97010 cryotherapy, 97034 contrast bath, 97760 Orthotics management and training, 19147 Splinting (initial encounter), [phone] Subsequent splinting/medication, passive range of motion, compression bandaging, energy conservation, coping strategies training, patient/family education, and DME and/or AE instructions  CONSULTED AND AGREED WITH PLAN OF CARE: Patient  PLAN FOR NEXT SESSION:  Check on any new issues, check progress, progress note and possible discharge  Fannie Knee, OTR/L ,CHT 07/21/2023, 10:59 AM

## 2023-07-21 ENCOUNTER — Ambulatory Visit (INDEPENDENT_AMBULATORY_CARE_PROVIDER_SITE_OTHER): Payer: Self-pay | Admitting: Rehabilitative and Restorative Service Providers"

## 2023-07-21 ENCOUNTER — Encounter: Payer: Self-pay | Admitting: Rehabilitative and Restorative Service Providers"

## 2023-07-21 DIAGNOSIS — M25641 Stiffness of right hand, not elsewhere classified: Secondary | ICD-10-CM

## 2023-07-21 DIAGNOSIS — M6281 Muscle weakness (generalized): Secondary | ICD-10-CM

## 2023-07-21 DIAGNOSIS — R278 Other lack of coordination: Secondary | ICD-10-CM

## 2023-07-21 DIAGNOSIS — M79641 Pain in right hand: Secondary | ICD-10-CM

## 2023-07-21 DIAGNOSIS — M25642 Stiffness of left hand, not elsewhere classified: Secondary | ICD-10-CM

## 2023-07-25 ENCOUNTER — Ambulatory Visit: Payer: Self-pay | Admitting: Nurse Practitioner

## 2023-07-28 ENCOUNTER — Encounter: Payer: Self-pay | Admitting: Rehabilitative and Restorative Service Providers"

## 2023-08-01 NOTE — Therapy (Signed)
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   Urticaria    Weakness 11/23/2016   Past Surgical History:  Procedure Laterality Date   CHOLECYSTECTOMY  07/2020   Patient Active Problem List   Diagnosis Date Noted   Radial styloid tenosynovitis (de quervain) 03/28/2023   Allergic conjunctivitis 12/27/2022   High risk medication use 07/30/2022   Language barrier 03/31/2022   AMA (advanced maternal age) multigravida 35+ 02/04/2022   Rash and other nonspecific skin eruption 08/31/2016   Seropositive rheumatoid arthritis (HCC) 02/12/2014   Obese 12/26/2013    ONSET DATE: Chronic, insidious onset   REFERRING DIAG: M05.9 (ICD-10-CM) - Seropositive rheumatoid arthritis (HCC)   THERAPY DIAG:  No diagnosis found.  Rationale for Evaluation and Treatment: Rehabilitation  PERTINENT HISTORY: Pre referral: "Evaluate and recommendation for any HEP or supportive devices - hand deformities 2/2 rheumatoid arthritis "  She states (through interpreter): She was diagnosed with rheumatoid arthritis 2 to 3 years ago and has been taking medications for it.  She has been having finger pain mainly in the right hand index and middle fingers, and also locking of the PIP joints into extension.  Her hands are stiff, she has difficulty making a fist or opening up her hands the right way.  She is unsure what to do about it but she has been seeing a rheumatologist.  PRECAUTIONS: None; RED FLAGS: None;  WEIGHT BEARING RESTRICTIONS: No    SUBJECTIVE:    SUBJECTIVE STATEMENT: She states ***  through her interpreter that her home activities have been going well, she has not had any new pains or problems, but her new thumb orthosis did cause swelling after she wore it "all day."  Pt accompanied by: interpreter     PAIN:  Are you having pain? Not now at rest  ***   PATIENT GOALS: To improve the use of her hands and also decrease pain if possible    OBJECTIVE: (All objective assessments below are from initial evaluation on: 07/07/23 unless otherwise specified.)   HAND DOMINANCE: Right   ADLs: Overall ADLs: States decreased ability to grab, hold household objects, pain and difficulty to open containers, perform FMS tasks (manipulate fasteners on clothing)   FUNCTIONAL OUTCOME MEASURES: 07/14/23: Quick DASH 16% impairment today  (Higher % Score  =  More Impairment)     UPPER EXTREMITY ROM     Shoulder to Wrist AROM Right eval Left eval Rt /Lt 07/21/23 Rt / Lt  08/04/23  Forearm supination stiff WFL 70 Rt *** Rt  Forearm pronation  stiff WFL 85 Rt *** Rt  Wrist flexion 61 65 59  /  65 *** / ***  Wrist extension 55 63 60 /  58 *** / ***  (Blank rows = not tested)   Hand AROM Right eval Left eval  Full Fist Ability (or Gap to Distal Palmar Crease) Very stiff tips of fingers barely touch palm WFL but loose   Thumb Opposition  (Kapandji Scale)  6/10 8/10  (Blank rows =  not tested)   HAND FUNCTION: 08/04/23:  Grip: Rt: ***#; Lt: ***#   07/21/23: Grip: Rt: 40#; Lt: 34#  Eval: Observed weakness in affected bil hand.  Grip strength Right: 31 lbs, Left: 33 lbs   COORDINATION: Eval: Observed coordination impairments with affected bil hand. 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  This seems somewhat chronic as they are rigidly in extension and OT has a difficult  time pushing them into flexion.  The lateral bands can be felt and seen snapping backwards and forwards with these motions.  She is passively flexible for the most part.  Fortunately both thumbs seem to be within functional limits and no significant deformity or pain.  She does have some pain and stiffness to the right forearm as well as the right wrist.   TODAY'S TREATMENT:  08/04/23: ***  Check on any new issues, check progress, progress note and possible discharge   07/21/23: She starts by performing active range of motion for detailed measures today which do not show much significant change at the wrists yet, but her right hand grip strength is significantly up.  OT asks if her orthoses are all working well and she states that her new thumb orthosis caused a bit of swelling after wearing it "all day."  She is not wearing it today.  She was recommended again to only wear it during periods of activity when she has a lot of thumb use-not all day.  She should remove it for "rest breaks" when not doing anything, just watching TV or just relaxing and even sleeping.  Additionally she states that her left hand small finger oval 8 customized orthosis is too bulky and falling off at times, so OT custom fabricates an oval 8 out of orthotic material that is smaller and fits her better.  She states this works well but is advised to switch back to the other if this 1 is not working for what ever reason.  OT then goes over her home exercises with her including all stretches and active range of motion exercises.  Also new putty activities are reviewed.  She states understanding these things.  Lastly, OT asks how her functional activities are going and she states no new problems and everything seems to be somewhat better.  Due to this we will allow her to perform these things on her own for 2 weeks and return with any new issues or problems for a progress note and to check goals.  She could return sooner if she is having  any issues.   Exercises/Activities Reviewed - Forearm Supination Stretch  - 3-4 x daily - 3-5 reps - 15 sec hold - Forearm Pronation Stretch  - 3-4 x daily - 3-5 reps - 15 sec hold - Wrist Flexion Stretch  - 4 x daily - 3-5 reps - 15 sec hold - Seated Wrist Extension Stretch  - 3-6 x daily - 3-5 reps - 15 hold - BACK KNUCKLE STRETCHES   - 4 x daily - 3-5 reps - 15 sec hold - HOOK Stretch  - 4 x daily - 3-5 reps - 15-20 sec hold - Seated Finger Composite Flexion Stretch  - 4 x daily - 3-5 reps - 15 hold - Tendon Glides  - 4-6 x daily - 3-5 reps - 2-3  seconds hold - Full Fist  - 2-3 x daily - 5 reps - Seated Claw Fist with Putty  - 2-3 x daily - 5 reps -"Pencil pushups" - Thumb Opposition with Putty  - 2-3 x daily - 5 reps   PATIENT EDUCATION: Education details: See tx section above for details  Person educated: Patient Education method: Verbal Instruction, Teach back, Handouts  Education comprehension: States and demonstrates understanding, Additional Education required    HOME EXERCISE PROGRAM: Access Code: [facility] URL: [email or web address] Date: 07/07/2023 Prepared by: Fannie Knee    GOALS: Goals reviewed with patient? Yes   SHORT TERM GOALS: (STG required if POC>30 days) Target Date: 07/22/23  Pt will obtain protective, custom orthotic. Goal status: 07/07/23: MET, but may need adjustments   2.  Pt will demo/state understanding of initial HEP to improve pain levels and prerequisite motion. Goal status: 07/21/23: MET   LONG TERM GOALS: Target Date: 08/19/23  Pt will improve functional ability by decreased impairment per Quick DASH assessment from TBD (when time allows) to <15% or better, for better quality of life. Goal status: 08/04/23: ***  2.  Pt will improve grip strength in bil hands from approx 30lbs to at least 45lbs for functional use at home and in IADLs. Goal status: 08/04/23: ***  3.  Pt will improve A/ROM in bil hands/fingers from  unable to make a complete fist to at least making a full fist, to have functional motion for tasks like reach and grasp.  Goal status: 08/04/23: ***  4.  Pt will decrease pain with motion from 2-3/10 to 1/10 or better to have better sleep and occupational participation in daily roles. Goal status: 08/04/23: ***  ASSESSMENT:  CLINICAL IMPRESSION: 08/04/23: ***   07/21/23: Since she seems to have all needed orthoses as well as recommendations and exercises, she will perform these things and use these things for 2 weeks on her own and then return for a progress note and possible discharge if all goals are met.   PLAN:  OT FREQUENCY: 1x/week  OT DURATION: 6 weeks through 08/19/23 and up to 6 visits   PLANNED INTERVENTIONS: 97168 OT Re-evaluation, 97535 self care/ADL training, 91478 therapeutic exercise, 97530 therapeutic activity, 97140 manual therapy, 97039 fluidotherapy, 97010 moist heat, 97010 cryotherapy, 97034 contrast bath, 97760 Orthotics management and training, 29562 Splinting (initial encounter), [phone] Subsequent splinting/medication, passive range of motion, compression bandaging, energy conservation, coping strategies training, patient/family education, and DME and/or AE instructions  CONSULTED AND AGREED WITH PLAN OF CARE: Patient  PLAN FOR NEXT SESSION:  ***  Fannie Knee, OTR/L ,CHT 08/01/2023, 3:40 PM

## 2023-08-04 ENCOUNTER — Ambulatory Visit (INDEPENDENT_AMBULATORY_CARE_PROVIDER_SITE_OTHER): Payer: Self-pay | Admitting: Rehabilitative and Restorative Service Providers"

## 2023-08-04 ENCOUNTER — Encounter: Payer: Self-pay | Admitting: Rehabilitative and Restorative Service Providers"

## 2023-08-04 DIAGNOSIS — R278 Other lack of coordination: Secondary | ICD-10-CM

## 2023-08-04 DIAGNOSIS — M6281 Muscle weakness (generalized): Secondary | ICD-10-CM

## 2023-08-04 DIAGNOSIS — M79641 Pain in right hand: Secondary | ICD-10-CM

## 2023-08-04 DIAGNOSIS — M25642 Stiffness of left hand, not elsewhere classified: Secondary | ICD-10-CM

## 2023-08-04 DIAGNOSIS — M25641 Stiffness of right hand, not elsewhere classified: Secondary | ICD-10-CM

## 2023-08-10 ENCOUNTER — Encounter: Payer: Self-pay | Admitting: Rehabilitative and Restorative Service Providers"

## 2023-08-18 ENCOUNTER — Ambulatory Visit
Admission: RE | Admit: 2023-08-18 | Discharge: 2023-08-18 | Disposition: A | Discharge status: Home / Self Care | Payer: Self-pay | Source: Ambulatory Visit | Attending: Nurse Practitioner | Admitting: Nurse Practitioner

## 2023-08-18 ENCOUNTER — Ambulatory Visit
Admission: RE | Admit: 2023-08-18 | Discharge: 2023-08-18 | Disposition: A | Discharge status: Home / Self Care | Payer: No Typology Code available for payment source | Source: Ambulatory Visit | Attending: Nurse Practitioner | Admitting: Nurse Practitioner

## 2023-08-18 ENCOUNTER — Ambulatory Visit: Payer: Self-pay | Admitting: Hematology and Oncology

## 2023-08-18 VITALS — BP 118/78 | Wt 191.0 lb

## 2023-08-18 DIAGNOSIS — N6321 Unspecified lump in the left breast, upper outer quadrant: Secondary | ICD-10-CM

## 2023-08-18 DIAGNOSIS — N644 Mastodynia: Secondary | ICD-10-CM

## 2023-08-18 NOTE — Patient Instructions (Signed)
Taught Karina Murphy about self breast awareness and gave educational materials to take home. Patient did not need a Pap smear today due to last Pap smear was in 02/16/2022 per patient.  Let her know [facility] will cover Pap smears every 5 years unless has a history of abnormal Pap smears. Referred patient to the [facility] for diagnostic mammogram. Appointment scheduled for 08/18/2023. Patient aware of appointment and will be there. Let patient know will follow up with her within the next couple weeks with results. Karina Murphy verbalized understanding.  Pascal Lux, NP 11:31 AM

## 2023-08-18 NOTE — Progress Notes (Signed)
Ms. Ahlana Rott is a 42 y.o. female who presents to [facility] clinic today with no complaints.    Pap Smear: Pap not smear completed today. Last Pap smear was 02/16/2022 and was normal. Per patient has no history of an abnormal Pap smear. Last Pap smear result is available in Epic.   Physical exam: Breasts Breasts symmetrical. No skin abnormalities bilateral breasts. No nipple retraction bilateral breasts. No nipple discharge bilateral breasts. No lymphadenopathy. No lumps palpated bilateral breasts.        Pelvic/Bimanual Pap is not indicated today    Smoking History: Patient has never smoked and was not referred to quit line.    Patient Navigation: Patient education provided. Access to services provided for patient through [facility] program. Natale Lay interpreter provided. No transportation provided   Colorectal Cancer Screening: Per patient has never had colonoscopy completed No complaints today.    Breast and Cervical Cancer Risk Assessment: Patient does not have family history of breast cancer, known genetic mutations, or radiation treatment to the chest before age 17. Patient does not have history of cervical dysplasia, immunocompromised, or DES exposure in-utero.  Risk Assessment   No risk assessment data       A: [facility] exam without pap smear No complaints with benign exam.   P: Referred patient to the [facility] for a diagnostic mammogram. Appointment scheduled 08/18/2023.  Pascal Lux, NP 08/18/2023 11:29 AM

## 2023-08-26 ENCOUNTER — Other Ambulatory Visit: Payer: Self-pay

## 2023-08-26 ENCOUNTER — Inpatient Hospital Stay: Payer: No Typology Code available for payment source | Attending: Obstetrics and Gynecology | Admitting: *Deleted

## 2023-08-26 VITALS — BP 110/80 | Ht 63.0 in | Wt 192.4 lb

## 2023-08-26 DIAGNOSIS — Z0000 Encounter for general adult medical examination without abnormal findings: Secondary | ICD-10-CM

## 2023-08-26 NOTE — Progress Notes (Signed)
[facility] initial screening   Interpreter- Natale Lay, [place]   Clinical Measurement:  Vitals:   08/26/23 1008 08/26/23 1027  BP: 112/80 110/80   Fasting Labs Drawn Today, will review with patient when they result.   Medical History: Patient states that she does not have high cholesterol, does not have high blood pressure and she does not have diabetes.  Medications: Patient states that she does not take medication to lower cholesterol, blood pressure or blood sugar.  Patient does not take an aspirin a day to help prevent a heart attack or stroke.    Blood pressure, self measurement: Patient states that she does measure blood pressure from home. She checks her blood pressure monthly. She shares her readings with a health care provider: no.   Nutrition: Patient states that on average she eats 1 cups of fruit and 2 cups of vegetables per day. Patient states that she does not eat fish at least 2 times per week. Patient eats about half servings of whole grains. Patient drinks less than 36 ounces of beverages with added sugar weekly: yes. Patient is currently watching sodium or salt intake: yes. In the past 7 days patient has consumed drinks containing alcohol on 0 days. On a day that patient consumes drinks containing alcohol on average 0 drinks are consumed.      Physical activity: Patient states that she gets 0 minutes of moderate and 0 minutes of vigorous physical activity each week.  Smoking status: Patient states that she has has never smoked .   Quality of life: Over the past 2 weeks patient states that she had little interest or pleasure in doing things: not at all. She has been feeling down, depressed or hopeless:not at all.   Social Determinants of Health Assessment:   Computer Use: During the last 12 months patient states that she has used any of the following: desktop/laptop, smart phone or tablet/other portable wireless computer: yes.   Internet Use: During the last 12 months,  did you or any member of your household have access to the internet: Yes, by paying a cell phone company or internet service provider.   Food Insecurities: During the last 12 months, where there any times when you were worried that you would run out of food because of a lack of money or other resources: No.   Transportation Barriers: During the last 12 months, have you missed a doctor's appointment because of transportation problems: No.   Childcare Barriers: If you are currently using childcare services, please identify  the type of services you use. (If not using childcare services, please select "Not applicable"): not applicable. During the last 12 months, have you had any barriers to childcare services such as: not applicable.   Housing: What is your housing situation today: I have housing.   Intimate Partner Violence: During the last 12 months, how often did your partner physically hurt you: never. During the last 12 months, how often did your partner insult you or talk down to you: never.  Medication Adherence: During the last 12 months, did you ever forget to take your medicine: No. During the last 12 months, were you careless ar times about taking your medicine: No. During the last 12 months, when you felt better did you sometimes stop taking your medication: No. During the last 12 months, sometimes if you felt worse when you took your medicine did you stop taking it: No.   Risk reduction and counseling:   Health Coaching:Spoke with patient about  the daily recommendations for fruits and vegetables. Showed patient what a serving size would look like. Patient consumes a serving of fish weekly. Patient states that she does like tuna and will start adding a serving weekly. Patient consumes a variety of whole grains: whole wheat bread, oatmeal and cereals. Patient has not been exercising recently. Encouraged patient to try and move her body daily through some form of exercise.   Goal: Weight  loss. Patient would like to loose weight but did not set a date on when she would like to achieve this goal. Will FU during her next health coaching session.   Navigation:  I will notify patient of lab results.  Patient is aware of 2 more health coaching sessions and a follow up.  Time: 25 minutes

## 2023-08-27 LAB — LIPID PANEL
Chol/HDL Ratio: 3.5 {ratio} (ref 0.0–4.4)
Cholesterol, Total: 162 mg/dL (ref 100–199)
HDL: 46 mg/dL (ref >39)
LDL Chol Calc (NIH): 100 mg/dL — ABNORMAL HIGH (ref 0–99)
Triglycerides: 87 mg/dL (ref 0–149)
VLDL Cholesterol Cal: 16 mg/dL (ref 5–40)

## 2023-08-27 LAB — GLUCOSE, RANDOM: Glucose: 93 mg/dL (ref 70–99)

## 2023-08-27 LAB — HEMOGLOBIN A1C
Est. average glucose Bld gHb Est-mCnc: 117 mg/dL
Hgb A1c MFr Bld: 5.7 % — ABNORMAL HIGH (ref 4.8–5.6)

## 2023-08-29 ENCOUNTER — Telehealth: Payer: Self-pay

## 2023-08-29 NOTE — Telephone Encounter (Signed)
Health coaching 2   interpreter- Natale Lay, [facility]   Labs- 162 cholesterol, 100 LDL cholesterol, 87 triglycerides, 46 HDL cholesterol, 5.7 hemoglobin A1C, 93 mean plasma glucose. Patient understands and is aware of her lab results.   Goals-  1. Watch the amount of fried and fatty foods consumed.  2. Increase heart healthy fish consumption x 2 servings weekly. 3. Watch the amount of sweet and sugary foods consumed. Watch the amount of carbs consumed. 4. Daily exercise for 20-30 minutes.    Navigation:  Patient is aware of 1 more health coaching sessions and a follow up.   Time- 10 MINUTES

## 2023-09-02 ENCOUNTER — Other Ambulatory Visit: Payer: Self-pay | Admitting: Internal Medicine

## 2023-09-02 ENCOUNTER — Encounter: Payer: Self-pay | Admitting: Nurse Practitioner

## 2023-09-02 DIAGNOSIS — Z79899 Other long term (current) drug therapy: Secondary | ICD-10-CM

## 2023-09-02 DIAGNOSIS — M069 Rheumatoid arthritis, unspecified: Secondary | ICD-10-CM

## 2023-09-05 NOTE — Telephone Encounter (Signed)
Last Fill: 04/28/2023  Labs: 06/17/2023  WBC 3.2 MCH 26.1 MCHC 30.8 Neutrophils Absolute 1.0 Glucose 103  TB Gold: 09/29/2022  Negative  Next Visit: 09/29/2023  Last Visit: 06/28/2023   WG:[id number] rheumatoid arthritis   Current Dose per office note 06/28/2023: Cimzia 400 mg subcu monthly.   Okay to refill Cimzia?

## 2023-09-15 NOTE — Progress Notes (Signed)
Office Visit Note  Patient: Karina Murphy             Date of Birth: 04-01-1981           MRN: [id number]             PCP: Claiborne Rigg, NP Referring: Claiborne Rigg, NP Visit Date: 09/29/2023   Subjective:  Follow-up (Patient states she has dry mouth and feels like she has less saliva. Patient states the dry mouth and throat have been going on for two months. )   Discussed the use of AI scribe software for clinical note transcription with the patient, who gave verbal consent to proceed.  History of Present Illness   Karina Murphy is a 43 y.o. 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  She reports a delay in receiving her arthritis medication in December, which resulted in a flare-up of symptoms, including swelling and discomfort in the hands and wrists. The patient is now back on her medication and the symptoms have improved.  The patient also experienced a sore throat and fever one week ago, which resolved with home remedies. She denies any need for antibiotics. The patient's daughter also had a sore throat around the same time.  In addition, the patient has been experiencing dryness in the mouth and throat for the past two months. She denies having a humidifier at home and has not been using any specific treatments for this symptom.  The patient's recent blood tests showed a low white blood cell count, specifically neutrophils, which was at 600 at one point. The patient was informed that this could be due to her arthritis medication and could increase her risk of bacterial infections.  The patient denies any other swelling outside of the hands and wrists. She has been using an ointment for the discomfort in her hands.       Previous HPI 06/28/2023 Karina Murphy is a 43 y.o. 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  Still has some persistent joint pain and stiffness most  commonly MCP and PIP joints.  Worse on her left thumb recently sometimes with associated swelling or sometimes without change in appearance.  She had recent episode where her usual health was not available and had to self administer her Cimzia had some small raised bump and discoloration around the injection site for a few days.  Recent labs from October 4 with leukopenia to 3.2 with 1000 neutrophils otherwise normal.   Previous HPI 03/28/2023 Karina Murphy is a 43 y.o. 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 Since last visit her joint pain is doing well without prolonged morning stiffness. Left shoulder pain is partially better, but has some increased wrist pain near the base of the thumbs. Frequently worsened when lifting her child. No interval infections or antibiotics. Skin rashes are ongoing on her arms, tried topical steroid that was prescribed did not help much. Started using an OTC eczema cream that is helping partially.   Previous HPI 12/27/22 Karina Murphy is a 43 y.o. female here for follow up for seropositive RA after starting Cimzia currently finishing loading dose before transition to 400 mg subcu every 4 weeks.  Since her last visit she also discontinued the sulfasalazine due to not seeing clinical benefit from this.  So far symptoms are largely improved since starting the Cimzia with decreased joint  pain and swelling.  Still has some pain and has morning stiffness every day but with decreased severity.  Has not noticed any major flareup or exacerbation from stopping the sulfasalazine.  She had an episode of viral gastroenteritis in March that resolved without major complication but has had some ongoing lower abdominal pain afterwards.  Still having skin rashes most often around her hand and wrist or foot.  Also with some eye itching and redness consistent with seasonal allergy symptoms.   Previous HPI 09/27/22 Karina Murphy  is a 43 y.o. 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 She delivered her baby last month uneventfully. For about 3 weeks notices new skin rashes on her right forearm and on both legs above the knees. This is itchy and bothers her most at night time.    Previous HPI 07/30/22 Karina Murphy is a 43 y.o. female here for seropositive RA. She was previously seeing rheumatology after onset of symptoms around 2014 with hand and foot inflammation initially.  She is also experienced involvement of bilateral shoulder pain and stiffness that started later.  She was on treatment but stopped following up after her rheumatologist left that practice in 2018.  Previous treatments including methotrexate and sulfasalazine and prednisone.  She has pretty much been off any maintenance medication since that time treatments have often consisted of intramuscular steroid injections as needed for disease flareups.  She is getting evaluation now largely due to increased healthcare system exposure with her pregnancy.  Her arthritis symptoms have overall been doing better during this pregnancy compared to before.  She is not taking any medications regularly for pain.  Worst affected area at the moment is pain and swelling in her right foot.   This encounter was conducted with assistance of in person Spanish language interpreter.   Review of Systems  Constitutional:  Positive for fatigue.  HENT:  Positive for mouth sores and mouth dryness.   Eyes:  Positive for dryness.  Respiratory:  Negative for shortness of breath.   Cardiovascular:  Negative for chest pain and palpitations.  Gastrointestinal:  Negative for blood in stool, constipation and diarrhea.  Endocrine: Negative for increased urination.  Genitourinary:  Negative for involuntary urination.  Musculoskeletal:  Positive for joint pain, joint pain and joint swelling. Negative for gait problem, myalgias, muscle  weakness, morning stiffness, muscle tenderness and myalgias.  Skin:  Positive for rash and hair loss. Negative for color change and sensitivity to sunlight.  Allergic/Immunologic: Negative for susceptible to infections.  Neurological:  Negative for dizziness and headaches.  Hematological:  Negative for swollen glands.  Psychiatric/Behavioral:  Positive for sleep disturbance. Negative for depressed mood. The patient is nervous/anxious.     PMFS History:  Patient Active Problem List   Diagnosis Date Noted   Radial styloid tenosynovitis (de quervain) 03/28/2023   Allergic conjunctivitis 12/27/2022   High risk medication use 07/30/2022   Language barrier 03/31/2022   AMA (advanced maternal age) multigravida 35+ 02/04/2022   Rash and other nonspecific skin eruption 08/31/2016   Seropositive rheumatoid arthritis (HCC) 02/12/2014   Obese 12/26/2013    Past Medical History:  Diagnosis Date   Anxiety    Arthritis, rheumatoid (HCC)    Breast pain, left 09/17/2016   Chronic headaches    GERD (gastroesophageal reflux disease)    Hypertension    Obesity    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   Urticaria    Weakness 11/23/2016  Family History  Problem Relation Age of Onset   Diabetes Mother    Hypertension Mother    Diabetes Brother    Stomach cancer Neg Hx    Colon cancer Neg Hx    Esophageal cancer Neg Hx    Pancreatic cancer Neg Hx    Asthma Neg Hx    Cancer Neg Hx    Heart disease Neg Hx    Stroke Neg Hx    Past Surgical History:  Procedure Laterality Date   CHOLECYSTECTOMY  07/2020   Social History   Social History Narrative   Completed 2nd grade.    Immunization History  Administered Date(s) Administered   Influenza,inj,Quad PF,6+ Mos 06/28/2014, 09/17/2016, 06/14/2022   Tdap 06/14/2022     Objective: Vital Signs: BP 113/71 (BP Location: Right Arm, Patient Position: Sitting, Cuff Size: Large)   Pulse 71   Resp 14   Ht 5\' 3"  (1.6 m)   Wt 194 lb (88 kg)    LMP  (LMP Unknown)   Breastfeeding No   BMI 34.37 kg/m    Physical Exam Eyes:     Conjunctiva/sclera: Conjunctivae normal.  Cardiovascular:     Rate and Rhythm: Normal rate and regular rhythm.  Pulmonary:     Effort: Pulmonary effort is normal.     Breath sounds: Normal breath sounds.  Lymphadenopathy:     Cervical: No cervical adenopathy.  Skin:    General: Skin is warm and dry.     Findings: Rash present.     Comments: Flat patchy hyperpigmented rash on right lateral upper arm and forearm  Neurological:     Mental Status: She is alert.  Psychiatric:        Mood and Affect: Mood normal.      Musculoskeletal Exam:  Shoulders full ROM no tenderness or swelling Elbows full ROM no tenderness or swelling Wrists full ROM no tenderness or swelling Fingers with partially reducible swan neck deformities flexion ROM about 70%, reducible lateral deviation, wearing PIP joint braces on right hand  Knees full ROM no tenderness or swelling Ankles full ROM no tenderness or swelling   Investigation: No additional findings.  Imaging: No results found.   Recent Labs: Lab Results  Component Value Date   WBC 3.2 (L) 06/17/2023   HGB 11.9 06/17/2023   PLT 274 06/17/2023   NA 140 06/17/2023   K 4.1 06/17/2023   CL 102 06/17/2023   CO2 23 06/17/2023   GLUCOSE 93 08/26/2023   BUN 14 06/17/2023   CREATININE 0.92 06/17/2023   BILITOT 0.7 06/17/2023   ALKPHOS 68 06/17/2023   AST 14 06/17/2023   ALT 12 06/17/2023   PROT 7.6 06/17/2023   ALBUMIN 4.3 06/17/2023   CALCIUM 9.6 06/17/2023   GFRAA 129 03/19/2020   QFTBGOLDPLUS Negative 09/29/2022    Speciality Comments: Cimzia started 11/01/2022  Procedures:  No procedures performed Allergies: Bee pollen, Pineapple, and Morphine   Assessment / Plan:     Visit Diagnoses: Seropositive rheumatoid arthritis (HCC) Delay in receiving Cimzia medication led to increased symptoms and swelling in the hands and wrists. Now back on  medication and symptoms have improved. -Continue Cimzia 400 mg [place] monthly -Checking ESR disease activity monitoring  High risk medication use - Cimzia 400 mg subcu monthly. No serious interval infections. Has had leukopenia but only mild neutropenia. -Checking CBC, CMP, TB for medication monitoring on long term Cimzia  Rash and other nonspecific skin eruption - Can try use of topical steroid or other  product as needed if itching or discomfort.  Recent Illness Reported sore throat, difficulty swallowing, and fever one week ago. Symptoms have since improved with no antibiotics required. -Monitor symptoms and seek medical attention if symptoms worsen or return.  Dry Eyes and Mouth Reported onset of symptoms two months ago, possibly related to rheumatoid arthritis or environmental factors. -Recommend over-the-counter eye drops such as Systane, Refresh, or Gentile. -Advise staying well hydrated and consider use of artificial sweeteners, gum, or lozenges to stimulate saliva production. -Consider use of biotin mouthwash or lozenges.  Breast Cancer Recent oncology follow-up in December showed no significant issues. -Continue regular follow-ups with oncologist as scheduled.        Orders: No orders of the defined types were placed in this encounter.  No orders of the defined types were placed in this encounter.    Follow-Up Instructions: Return in about 3 months (around 12/28/2023) for Ra on CIM f/u 3mos.   Fuller Plan, MD  Note - This record has been created using [employer].  Chart creation errors have been sought, but may not always  have been located. Such creation errors do not reflect on  the standard of medical care.

## 2023-09-29 ENCOUNTER — Other Ambulatory Visit (HOSPITAL_COMMUNITY)
Admission: RE | Admit: 2023-09-29 | Discharge: 2023-09-29 | Disposition: A | Discharge status: Home / Self Care | Payer: No Typology Code available for payment source | Source: Ambulatory Visit | Attending: Internal Medicine | Admitting: Internal Medicine

## 2023-09-29 ENCOUNTER — Encounter: Payer: Self-pay | Admitting: Internal Medicine

## 2023-09-29 ENCOUNTER — Ambulatory Visit: Payer: No Typology Code available for payment source | Attending: Internal Medicine | Admitting: Internal Medicine

## 2023-09-29 VITALS — BP 113/71 | HR 71 | Resp 14 | Ht 63.0 in | Wt 194.0 lb

## 2023-09-29 DIAGNOSIS — M059 Rheumatoid arthritis with rheumatoid factor, unspecified: Secondary | ICD-10-CM

## 2023-09-29 DIAGNOSIS — R21 Rash and other nonspecific skin eruption: Secondary | ICD-10-CM

## 2023-09-29 DIAGNOSIS — Z79899 Other long term (current) drug therapy: Secondary | ICD-10-CM

## 2023-09-29 LAB — CBC WITH DIFFERENTIAL/PLATELET
Abs Immature Granulocytes: 0.01 10*3/uL (ref 0.00–0.07)
Basophils Absolute: 0 10*3/uL (ref 0.0–0.1)
Basophils Relative: 1 %
Eosinophils Absolute: 0.1 10*3/uL (ref 0.0–0.5)
Eosinophils Relative: 4 %
HCT: 37.6 % (ref 36.0–46.0)
Hemoglobin: 11.9 g/dL — ABNORMAL LOW (ref 12.0–15.0)
Immature Granulocytes: 0 %
Lymphocytes Relative: 56 %
Lymphs Abs: 1.7 10*3/uL (ref 0.7–4.0)
MCH: 26 pg (ref 26.0–34.0)
MCHC: 31.6 g/dL (ref 30.0–36.0)
MCV: 82.3 fL (ref 80.0–100.0)
Monocytes Absolute: 0.4 10*3/uL (ref 0.1–1.0)
Monocytes Relative: 15 %
Neutro Abs: 0.7 10*3/uL — ABNORMAL LOW (ref 1.7–7.7)
Neutrophils Relative %: 24 %
Platelets: 274 10*3/uL (ref 150–400)
RBC: 4.57 MIL/uL (ref 3.87–5.11)
RDW: 14.6 % (ref 11.5–15.5)
Smear Review: NORMAL
WBC: 3 10*3/uL — ABNORMAL LOW (ref 4.0–10.5)
nRBC: 0 % (ref 0.0–0.2)

## 2023-09-29 LAB — COMPREHENSIVE METABOLIC PANEL
ALT: 11 U/L (ref 0–44)
AST: 15 U/L (ref 15–41)
Albumin: 4 g/dL (ref 3.5–5.0)
Alkaline Phosphatase: 64 U/L (ref 38–126)
Anion gap: 7 (ref 5–15)
BUN: 11 mg/dL (ref 6–20)
CO2: 22 mmol/L (ref 22–32)
Calcium: 9.3 mg/dL (ref 8.9–10.3)
Chloride: 106 mmol/L (ref 98–111)
Creatinine, Ser: 0.86 mg/dL (ref 0.44–1.00)
GFR, Estimated: >60 mL/min (ref >60)
Glucose, Bld: 101 mg/dL — ABNORMAL HIGH (ref 70–99)
Potassium: 4 mmol/L (ref 3.5–5.1)
Sodium: 135 mmol/L (ref 135–145)
Total Bilirubin: 0.9 mg/dL (ref 0.0–1.2)
Total Protein: 8.1 g/dL (ref 6.5–8.1)

## 2023-09-29 LAB — SEDIMENTATION RATE: Sed Rate: 20 mm/h (ref 0–22)

## 2023-09-29 NOTE — Patient Instructions (Signed)
Para los ojos secos, puede probar el uso de una solucin de lgrimas artificiales disponible sin receta. Algunas marcas son Systane, Refresh, Genteal o muchas tiendas tienen [place].  Intente usar un humidificador en el dormitorio durante la noche durante el invierno para ayudar con el aire muy seco.  Para la boca seca, mantngase bien hidratado. Puedes utilizar chicles o pastillas con edulcorante artificial para estimular la saliva. El enjuague bucal o las pastillas para chupar Biotene tambin pueden ser tiles. Asegrese de [employer] higiene dental, ya que la sequedad puede aumentar el riesgo de caries o gingivitis.

## 2023-10-04 LAB — QUANTIFERON-TB GOLD PLUS (RQFGPL)
QuantiFERON Mitogen Value: >10 [IU]/mL
QuantiFERON Nil Value: 0.06 [IU]/mL
QuantiFERON TB1 Ag Value: 0.1 [IU]/mL
QuantiFERON TB2 Ag Value: 0.13 [IU]/mL

## 2023-10-04 LAB — QUANTIFERON-TB GOLD PLUS: QuantiFERON-TB Gold Plus: NEGATIVE

## 2023-10-05 ENCOUNTER — Ambulatory Visit: Payer: Self-pay | Admitting: Gastroenterology

## 2023-10-05 ENCOUNTER — Encounter: Payer: Self-pay | Admitting: Gastroenterology

## 2023-10-05 VITALS — BP 120/70 | HR 84 | Ht 63.0 in | Wt 193.0 lb

## 2023-10-05 DIAGNOSIS — K219 Gastro-esophageal reflux disease without esophagitis: Secondary | ICD-10-CM

## 2023-10-05 DIAGNOSIS — R1013 Epigastric pain: Secondary | ICD-10-CM

## 2023-10-05 DIAGNOSIS — R112 Nausea with vomiting, unspecified: Secondary | ICD-10-CM

## 2023-10-05 DIAGNOSIS — R197 Diarrhea, unspecified: Secondary | ICD-10-CM

## 2023-10-05 NOTE — Patient Instructions (Addendum)
_______________________________________________________  If your blood pressure at your visit was 140/90 or greater, please contact your primary care physician to follow up on this.  _______________________________________________________  If you are age 41 or older, your body mass index should be between 23-30. Your Body mass index is 34.19 kg/m. If this is out of the aforementioned range listed, please consider follow up with your Primary Care Provider.  If you are age 50 or younger, your body mass index should be between 19-25. Your Body mass index is 34.19 kg/m. If this is out of the aformentioned range listed, please consider follow up with your Primary Care Provider.   ________________________________________________________  Please purchase the following medications over the counter and take as directed: Fiber daily (benefiber is a good choice)  Compre los siguientes medicamentos sin receta y tmelos segn las indicaciones: Fibra diaria (la fibra beneficiosa es una buena opcin)   Patient opted to not do diartherix due to insurance purposes   Su proveedor le ha ordenado una prueba de heces con "Diatherix". Usted recibi [employer] un kit de nuestra oficina que contiene todos los suministros necesarios para completar esta prueba. Lea atentamente las instrucciones de recoleccin de heces proporcionadas en el kit antes de abrir los materiales que lo acompaan. Adems, asegrese de Chemical engineer de la esquina superior derecha de la hoja de solicitud del laboratorio en el tubo "puritan opti-swab" que se suministra con Probation officer. Esta etiqueta debe incluir su nombre completo y fecha de nacimiento. Despus de completar la prueba, debe asegurar el tubo [place] en la bolsa para muestras de riesgo biolgico. La hoja de informacin de solicitud del laboratorio (que incluye la fecha y hora de la recoleccin de la Leisure centre manager) debe colocarse en el bolsillo exterior de la bolsa de muestras de riesgo  biolgico y Database administrator al laboratorio de Adult nurse dentro de los 2 das de Financial trader.  Your provider has ordered "Diatherix" stool testing for you. You have received a kit from our office today containing all necessary supplies to complete this test. Please carefully read the stool collection instructions provided in the kit before opening the accompanying materials. In addition, be sure to place the label from the top right corner of the laboratory request sheet onto the "puritan opti-swab" tube that is supplied in the kit. This label should include your full name and date of birth. After completing the test, you should secure the purtian tube into the specimen biohazard bag. The laboratory request information sheet (including date and time of specimen collection) should be placed into the outside pocket of the specimen biohazard bag and returned to the [facility] lab with 2 days of collection.   If the laboratory information sheet specimen date and time are not filled out, the test will NOT be performed.   Le han programado una endoscopia. Siga las instrucciones escritas que se le dieron en su visita de hoy.  Si [place] inhaladores (incluso solo cuando sea necesario), trigalos con usted el da de su procedimiento.  Si toma alguno de los [employer], ser necesario ajustarlos antes de su procedimiento:   NO TOME 7 DAS ANTES DE LA PRUEBA. Trulicidad (dulaglutida) Ozempic, [place] (semaglutida) Mounjaro (tirzepatida) Bydureon Bcise (exanatida de liberacin prolongada)  NO TOME 1 DA ANTES DE SU PRUEBA Rybelsus (semaglutida) Adlyxin (lixisenatida) Victoza (liraglutida) Byetta (exanatida)   You have been scheduled for an endoscopy. Please follow written instructions given to you at your visit today.  If you use inhalers (even only as needed), please bring them  with you on the day of your procedure.  If you take any of the following medications, they will need to be adjusted prior  to your procedure:   DO NOT TAKE 7 DAYS PRIOR TO TEST- Trulicity (dulaglutide) Ozempic, Wegovy (semaglutide) Mounjaro (tirzepatide) Bydureon Bcise (exanatide extended release)  DO NOT TAKE 1 DAY PRIOR TO YOUR TEST Rybelsus (semaglutide) Adlyxin (lixisenatide) Victoza (liraglutide) Byetta (exanatide) ___________________________________________________________________________  It was a pleasure to see you today!  Thank you for trusting me with your gastrointestinal care!

## 2023-10-05 NOTE — Progress Notes (Signed)
Chief Complaint: Nausea and vomiting Primary GI MD: Dr. Lavon Paganini  HPI: 43 year old female with medical history as listed below presents for evaluation of nausea and vomiting.  Translator presents.  Last seen March 2023 by Mike Gip PA-C  At that time she was having recurrent upper abdominal pain, nausea, vomiting.  Prior workup in 2017 for similar symptoms with EGD showing mild antral gastritis, otherwise negative.  No H. pylori.  S/p cholecystectomy.  Patient was scheduled for CT abdomen pelvis with contrast 12/03/2021 which showed no acute process.  It does show a moderate amount of retained stool and she was recommended to take MiraLAX on a daily basis   Discussed the use of AI scribe software for clinical note transcription with the patient, who gave verbal consent to proceed.  History of Present Illness   The patient presents with persistent abdominal pain and daily vomiting. The discomfort is localized above the belly button and is exacerbated by eating. The patient reports that she vomits after each meal and often needs to induce vomiting before sleep to alleviate discomfort. Despite nightly use of Pantoprazole, the symptoms persist, although the medication provides slight relief.  The patient has not tried taking Pantoprazole twice daily and denies the use of NSAIDs. She also reports a new onset of diarrhea for the past four days. She typically has constipation. The patient expresses concern about a burning sensation in the tongue and throat after vomiting, which interferes with her ability to eat.     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 Refrigerate. Protect from light. 3 each 0   ibuprofen (ADVIL) 600 MG tablet Take 1 tablet (600 mg total) by mouth every 6 (six) hours. 30 tablet 0   pantoprazole (PROTONIX) 40 MG tablet Take 1 tablet (40 mg total) by mouth daily. For heartburn or stomach pain 90 tablet 1   triamcinolone ointment (KENALOG) 0.5 % Apply 1 Application topically 2 (two) times daily as needed. 30 g 0   Omega-3 Fatty Acids (OMEGA 3 PO) Take by mouth. (Patient not taking: Reported on 10/05/2023)     ondansetron (ZOFRAN-ODT) 4 MG disintegrating tablet Take 1 tablet (4 mg total) by mouth every 8 (eight) hours as needed for nausea or vomiting. (Patient not taking: Reported on 10/05/2023) 30 tablet 1   No current facility-administered medications for this visit.    Allergies as of 10/05/2023 - Review Complete 10/05/2023  Allergen Reaction Noted   Bee pollen  12/27/2022   Pineapple Itching 12/06/2016   Morphine Palpitations 12/01/2021    Family History  Problem Relation Age of Onset   Diabetes Mother    Hypertension Mother    Diabetes Brother    Stomach cancer Neg Hx    Colon cancer Neg Hx    Esophageal cancer Neg Hx    Pancreatic cancer Neg Hx    Asthma Neg Hx    Cancer Neg Hx    Heart disease  Neg Hx    Stroke Neg Hx     Social History   Socioeconomic History   Marital status: Married    Spouse name: Not on file   Number of children: 2   Years of education: Not on file   Highest education level: Not on file  Occupational History   Not on file  Tobacco Use   Smoking status: Never    Passive exposure: Never   Smokeless tobacco: Never  Vaping Use   Vaping status: Never Used  Substance and Sexual Activity   Alcohol use: No   Drug use: No   Sexual activity: Not Currently    Birth control/protection: Condom  Other  Topics Concern   Not on file  Social History Narrative   Completed 2nd grade.    Social Drivers of Corporate investment banker Strain: High Risk (06/17/2023)   Overall Financial Resource Strain (CARDIA)    Difficulty of Paying Living Expenses: Very hard  Food Insecurity: No Food Insecurity (08/26/2023)   Hunger Vital Sign    Worried About Running Out of Food in the Last Year: Never true    Ran Out of Food in the Last Year: Never true  Recent Concern: Food Insecurity - Food Insecurity Present (06/17/2023)   Hunger Vital Sign    Worried About Running Out of Food in the Last Year: Sometimes true    Ran Out of Food in the Last Year: Never true  Transportation Needs: No Transportation Needs (08/26/2023)   PRAPARE - Administrator, Civil Service (Medical): No    Lack of Transportation (Non-Medical): No  Physical Activity: Insufficiently Active (06/17/2023)   Exercise Vital Sign    Days of Exercise per Week: 2 days    Minutes of Exercise per Session: 20 min  Stress: Stress Concern Present (06/17/2023)   [employer] of Occupational Health - Occupational Stress Questionnaire    Feeling of Stress : To some extent  Social Connections: Socially Integrated (06/17/2023)   Social Connection and Isolation Panel [NHANES]    Frequency of Communication with Friends and Family: Twice a week    Frequency of Social Gatherings with Friends and Family: Twice a week    Attends Religious Services: 1 to 4 times per year    Active Member of [employer] or Organizations: No    Attends Engineer, structural: 1 to 4 times per year    Marital Status: Married  Catering manager Violence: Not At Risk (06/17/2023)   Humiliation, Afraid, Rape, and Kick questionnaire    Fear of Current or Ex-Partner: No    Emotionally Abused: No    Physically Abused: No    Sexually Abused: No    Review of Systems:    Constitutional: No weight loss, fever, chills, weakness or fatigue HEENT: Eyes: No change in  vision               Ears, Nose, Throat:  No change in hearing or congestion Skin: No rash or itching Cardiovascular: No chest pain, chest pressure or palpitations   Respiratory: No SOB or cough Gastrointestinal: See HPI and otherwise negative Genitourinary: No dysuria or change in urinary frequency Neurological: No headache, dizziness or syncope Musculoskeletal: No new muscle or joint pain Hematologic: No bleeding or bruising Psychiatric: No history of depression or anxiety    Physical Exam:  Vital signs: BP 120/70 (BP Location: Right Arm, Patient Position: Sitting, Cuff Size: Normal)   Pulse 84   Ht 5\' 3"  (1.6  m)   Wt 193 lb (87.5 kg)   LMP  (LMP Unknown)   SpO2 93%   BMI 34.19 kg/m   Constitutional: NAD, Well developed, Well nourished, alert and cooperative Head:  Normocephalic and atraumatic. Eyes:   PEERL, EOMI. No icterus. Conjunctiva pink. Respiratory: Respirations even and unlabored. Lungs clear to auscultation bilaterally.   No wheezes, crackles, or rhonchi.  Cardiovascular:  Regular rate and rhythm. No peripheral edema, cyanosis or pallor.  Gastrointestinal:  Soft, nondistended, nontender. No rebound or guarding. Normal bowel sounds. No appreciable masses or hepatomegaly. Rectal:  Not performed.  Msk:  Symmetrical without gross deformities. Without edema, no deformity or joint abnormality.  Neurologic:  Alert and  oriented x4;  grossly normal neurologically.  Skin:   Dry and intact without significant lesions or rashes. Psychiatric: Oriented to person, place and time. Demonstrates good judgement and reason without abnormal affect or behaviors.  RELEVANT LABS AND IMAGING: CBC    Component Value Date/Time   WBC 3.0 (L) 09/29/2023 1215   RBC 4.57 09/29/2023 1215   HGB 11.9 (L) 09/29/2023 1215   HGB 11.9 06/17/2023 1430   HCT 37.6 09/29/2023 1215   HCT 38.6 06/17/2023 1430   PLT 274 09/29/2023 1215   PLT 274 06/17/2023 1430   MCV 82.3 09/29/2023 1215   MCV 85  06/17/2023 1430   MCH 26.0 09/29/2023 1215   MCHC 31.6 09/29/2023 1215   RDW 14.6 09/29/2023 1215   RDW 14.2 06/17/2023 1430   LYMPHSABS 1.7 09/29/2023 1215   LYMPHSABS 1.7 06/17/2023 1430   MONOABS 0.4 09/29/2023 1215   EOSABS 0.1 09/29/2023 1215   EOSABS 0.1 06/17/2023 1430   BASOSABS 0.0 09/29/2023 1215   BASOSABS 0.0 06/17/2023 1430    CMP     Component Value Date/Time   NA 135 09/29/2023 1215   NA 140 06/17/2023 1430   K 4.0 09/29/2023 1215   CL 106 09/29/2023 1215   CO2 22 09/29/2023 1215   GLUCOSE 101 (H) 09/29/2023 1215   BUN 11 09/29/2023 1215   BUN 14 06/17/2023 1430   CREATININE 0.86 09/29/2023 1215   CREATININE 0.81 11/19/2016 1648   CALCIUM 9.3 09/29/2023 1215   PROT 8.1 09/29/2023 1215   PROT 7.6 06/17/2023 1430   ALBUMIN 4.0 09/29/2023 1215   ALBUMIN 4.3 06/17/2023 1430   AST 15 09/29/2023 1215   ALT 11 09/29/2023 1215   ALKPHOS 64 09/29/2023 1215   BILITOT 0.9 09/29/2023 1215   BILITOT 0.7 06/17/2023 1430   GFRNONAA >60 09/29/2023 1215   GFRNONAA >89 11/19/2016 1648   GFRAA 129 03/19/2020 1659   GFRAA >89 11/19/2016 1648     Assessment/Plan:      Gastroesophageal Reflux Disease (GERD) Epigastric pain Nausea and vomiting Epigastric pain worse with eating, nausea and vomiting with eating as well as GERD with minimal relief from pantoprazole 40 Mg once daily.  Previous EGD in 2017 showed gastritis (H. pylori negative).  Denies NSAID use.  DDx includes esophagitis, gastritis, PUD.  Since it has been 7 years since her EGD it is reasonable to consider repeat. - EGD for further evaluation, will take biopsies for H. pylori - I thoroughly discussed the procedure with the patient (at bedside) to include nature of the procedure, alternatives, benefits, and risks (including but not limited to bleeding, infection, perforation, anesthesia/cardiac pulmonary complications).  Patient verbalized understanding and gave verbal consent to proceed with procedure.  -  Pending EGD results will likely increase pantoprazole to 40 Mg twice daily  Diarrhea presumed infectious New onset of diarrhea for the past four days.  Typically she deals with constipation. - GI pathogen panel to rule out infection - Recommend getting on a fiber supplement daily - If persistent diarrhea would recommend KUB to evaluate for overflow diarrhea with her history of constipation    Brenen Beigel Jolee Ewing [facility] 10/05/2023, 2:08 PM  Cc: Claiborne Rigg, NP

## 2023-10-13 ENCOUNTER — Encounter: Payer: Self-pay | Admitting: Gastroenterology

## 2023-10-14 ENCOUNTER — Encounter: Payer: No Typology Code available for payment source | Admitting: Gastroenterology

## 2023-11-07 ENCOUNTER — Other Ambulatory Visit: Payer: Self-pay | Admitting: Internal Medicine

## 2023-11-07 DIAGNOSIS — Z79899 Other long term (current) drug therapy: Secondary | ICD-10-CM

## 2023-11-07 DIAGNOSIS — M069 Rheumatoid arthritis, unspecified: Secondary | ICD-10-CM

## 2023-11-08 NOTE — Progress Notes (Signed)
 Sedimentation rate of 20 was normal.  QuantiFERON test was negative.  Kidney and liver function were normal. Unfortunately the white blood cell count continues to be low at 3.0 especially the neutrophil count was down to 700.  Levels less than 1000 can significantly increase the risk for infections.  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  I recommend she decrease to taking only a single dose of the Cimzia monthly and see if this still provides her benefit and we could recheck the blood test at this decreased dose.

## 2023-11-08 NOTE — Telephone Encounter (Signed)
 Last Fill: 09/05/2023  Labs: 09/29/2023 WBC 3.0 Hemoglobin 11.9 Neutro Abs 0.7 Glucose 101  TB Gold: 09/29/2023 Negative   Next Visit: 12/28/2023  Last Visit: 09/29/2023  [id number] rheumatoid arthritis (HCC)   Current Dose per office note 09/29/2023: Cimzia 400 mg [place] monthly   Okay to refill Cimzia?

## 2023-11-11 ENCOUNTER — Ambulatory Visit (AMBULATORY_SURGERY_CENTER): Payer: Self-pay

## 2023-11-11 VITALS — Ht 63.0 in | Wt 198.0 lb

## 2023-11-11 DIAGNOSIS — K219 Gastro-esophageal reflux disease without esophagitis: Secondary | ICD-10-CM

## 2023-11-11 DIAGNOSIS — R1013 Epigastric pain: Secondary | ICD-10-CM

## 2023-11-11 DIAGNOSIS — R112 Nausea with vomiting, unspecified: Secondary | ICD-10-CM

## 2023-11-11 NOTE — Progress Notes (Signed)
 No egg or soy allergy known to patient  No issues known to pt with past sedation with any surgeries or procedures Patient denies ever being told they had issues or difficulty with intubation  No FH of Malignant Hyperthermia Pt is not on diet pills Pt is not on  home 02  Pt is not on blood thinners  Pt has an issue with constipation and takes prune juice No A fib or A flutter Have any cardiac testing pending--no Pt can ambulate  Pt denies use of chewing tobacco Discussed diabetic I weight loss medication holds Discussed NSAID holds Checked BMI Pt instructed to use Singlecare.com or GoodRx for a price reduction on prep  Patient's chart reviewed by Cathlyn Parsons CNRA prior to previsit and patient appropriate for the [facility].  Pre visit completed and red dot placed by patient's name on their procedure day (on provider's schedule).

## 2023-11-25 ENCOUNTER — Encounter: Payer: Self-pay | Admitting: Gastroenterology

## 2023-11-25 ENCOUNTER — Other Ambulatory Visit: Payer: Self-pay

## 2023-11-25 ENCOUNTER — Ambulatory Visit (AMBULATORY_SURGERY_CENTER): Payer: Self-pay | Admitting: Gastroenterology

## 2023-11-25 VITALS — BP 116/86 | HR 61 | Temp 98.6°F | Resp 19 | Ht 63.0 in | Wt 198.0 lb

## 2023-11-25 DIAGNOSIS — K219 Gastro-esophageal reflux disease without esophagitis: Secondary | ICD-10-CM

## 2023-11-25 DIAGNOSIS — K2970 Gastritis, unspecified, without bleeding: Secondary | ICD-10-CM

## 2023-11-25 DIAGNOSIS — R1013 Epigastric pain: Secondary | ICD-10-CM

## 2023-11-25 MED ORDER — SODIUM CHLORIDE 0.9 % IV SOLN: 500.0000 mL | Freq: Once | INTRAVENOUS | Status: DC

## 2023-11-25 NOTE — Progress Notes (Signed)
 Pt's states no medical or surgical changes since previsit or office visit.

## 2023-11-25 NOTE — Progress Notes (Signed)
 [facility] History and Physical   Primary Care Physician:  Claiborne Rigg, NP   Reason for Procedure:  Nausea, vomiting, epigastic pain  Plan:    EGD and colonoscopy with possible interventions as needed     HPI: Karina Murphy is a very pleasant 43 y.o. female here for EGD for persistent abdominal discomfort, nausea and vomiting.   The risks and benefits as well as alternatives of endoscopic procedure(s) have been discussed and reviewed. All questions answered. The patient agrees to proceed.    Past Medical History:  Diagnosis Date   Anxiety    Arthritis, rheumatoid (HCC)    Breast pain, left 09/17/2016   Chronic headaches    GERD (gastroesophageal reflux disease)    Obesity    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   Urticaria    Weakness 11/23/2016    Past Surgical History:  Procedure Laterality Date   CHOLECYSTECTOMY  07/2020    Prior to Admission medications   Medication Sig Start Date End Date Taking? Authorizing Provider  CIMZIA, 2 SYRINGE, 200 MG/ML prefilled syringe Inject 2 syringes (400mg ) under skin every four weeks as directed. Refrigerate. Protect from light. 11/08/23   Rice, Jamesetta Orleans, MD  ibuprofen (ADVIL) 600 MG tablet Take 1 tablet (600 mg total) by mouth every 6 (six) hours. 08/26/22   Wouk, Wilfred Curtis, MD  Omega-3 Fatty Acids (OMEGA 3 PO) Take by mouth.    [provider]  ondansetron (ZOFRAN-ODT) 4 MG disintegrating tablet Take 1 tablet (4 mg total) by mouth every 8 (eight) hours as needed for nausea or vomiting. 06/17/23   Claiborne Rigg, NP  pantoprazole (PROTONIX) 40 MG tablet Take 1 tablet (40 mg total) by mouth daily. For heartburn or stomach pain 06/17/23   Claiborne Rigg, NP  triamcinolone ointment (KENALOG) 0.5 % Apply 1 Application topically 2 (two) times daily as needed. Patient not taking: Reported on 11/11/2023 09/27/22   Fuller Plan, MD    Current Outpatient Medications  Medication Sig  Dispense Refill   CIMZIA, 2 SYRINGE, 200 MG/ML prefilled syringe Inject 2 syringes (400mg ) under skin every four weeks as directed. Refrigerate. Protect from light. 3 each 0   ibuprofen (ADVIL) 600 MG tablet Take 1 tablet (600 mg total) by mouth every 6 (six) hours. 30 tablet 0   Omega-3 Fatty Acids (OMEGA 3 PO) Take by mouth.     ondansetron (ZOFRAN-ODT) 4 MG disintegrating tablet Take 1 tablet (4 mg total) by mouth every 8 (eight) hours as needed for nausea or vomiting. 30 tablet 1   pantoprazole (PROTONIX) 40 MG tablet Take 1 tablet (40 mg total) by mouth daily. For heartburn or stomach pain 90 tablet 1   triamcinolone ointment (KENALOG) 0.5 % Apply 1 Application topically 2 (two) times daily as needed. (Patient not taking: Reported on 11/11/2023) 30 g 0   Current Facility-Administered Medications  Medication Dose Route Frequency Provider Last Rate Last Admin   0.9 %  sodium chloride infusion  500 mL Intravenous Once Napoleon Form, MD        Allergies as of 11/25/2023 - Review Complete 11/25/2023  Allergen Reaction Noted   Bee pollen  12/27/2022   Pineapple Itching 12/06/2016   Morphine Palpitations 12/01/2021    Family History  Problem Relation Age of Onset   Diabetes Mother    Hypertension Mother    Diabetes Brother    Stomach cancer Neg Hx    Colon cancer Neg Hx    Esophageal  cancer Neg Hx    Pancreatic cancer Neg Hx    Asthma Neg Hx    Cancer Neg Hx    Heart disease Neg Hx    Stroke Neg Hx    Colon polyps Neg Hx    Rectal cancer Neg Hx     Social History   Socioeconomic History   Marital status: Married    Spouse name: Not on file   Number of children: 2   Years of education: Not on file   Highest education level: Not on file  Occupational History   Not on file  Tobacco Use   Smoking status: Never    Passive exposure: Never   Smokeless tobacco: Never  Vaping Use   Vaping status: Never Used  Substance and Sexual Activity   Alcohol use: No   Drug use:  No   Sexual activity: Not Currently    Birth control/protection: Condom  Other Topics Concern   Not on file  Social History Narrative   Completed 2nd grade.    Social Drivers of Corporate investment banker Strain: High Risk (06/17/2023)   Overall Financial Resource Strain (CARDIA)    Difficulty of Paying Living Expenses: Very hard  Food Insecurity: No Food Insecurity (08/26/2023)   Hunger Vital Sign    Worried About Running Out of Food in the Last Year: Never true    Ran Out of Food in the Last Year: Never true  Recent Concern: Food Insecurity - Food Insecurity Present (06/17/2023)   Hunger Vital Sign    Worried About Running Out of Food in the Last Year: Sometimes true    Ran Out of Food in the Last Year: Never true  Transportation Needs: No Transportation Needs (08/26/2023)   PRAPARE - Administrator, Civil Service (Medical): No    Lack of Transportation (Non-Medical): No  Physical Activity: Insufficiently Active (06/17/2023)   Exercise Vital Sign    Days of Exercise per Week: 2 days    Minutes of Exercise per Session: 20 min  Stress: Stress Concern Present (06/17/2023)   [employer] of Occupational Health - Occupational Stress Questionnaire    Feeling of Stress : To some extent  Social Connections: Socially Integrated (06/17/2023)   Social Connection and Isolation Panel [NHANES]    Frequency of Communication with Friends and Family: Twice a week    Frequency of Social Gatherings with Friends and Family: Twice a week    Attends Religious Services: 1 to 4 times per year    Active Member of [employer] or Organizations: No    Attends Engineer, structural: 1 to 4 times per year    Marital Status: Married  Catering manager Violence: Not At Risk (06/17/2023)   Humiliation, Afraid, Rape, and Kick questionnaire    Fear of Current or Ex-Partner: No    Emotionally Abused: No    Physically Abused: No    Sexually Abused: No    Review of Systems:  All other  review of systems negative except as mentioned in the HPI.  Physical Exam: Vital signs in last 24 hours: BP 132/86   Pulse 69   Temp 98.6 F (37 C) (Temporal)   Ht 5\' 3"  (1.6 m)   Wt 198 lb (89.8 kg)   SpO2 99%   BMI 35.07 kg/m  General:   Alert, NAD Lungs:  Clear .   Heart:  Regular rate and rhythm Abdomen:  Soft, nontender and nondistended. Neuro/Psych:  Alert and cooperative. Normal mood  and affect. A and O x 3  Reviewed labs, radiology imaging, old records and pertinent past GI work up  Patient is appropriate for planned procedure(s) and anesthesia in an ambulatory setting   K. Scherry Ran , MD [phone]

## 2023-11-25 NOTE — Patient Instructions (Signed)
 YOU HAD AN ENDOSCOPIC PROCEDURE TODAY AT THE [facility]:   Refer to the procedure report that was given to you for any specific questions about what was found during the examination.  If the procedure report does not answer your questions, please call your gastroenterologist to clarify.  If you requested that your care partner not be given the details of your procedure findings, then the procedure report has been included in a sealed envelope for you to review at your convenience later.  YOU SHOULD EXPECT: Some feelings of bloating in the abdomen. Passage of more gas than usual.  Walking can help get rid of the air that was put into your GI tract during the procedure and reduce the bloating. If you had a lower endoscopy (such as a colonoscopy or flexible sigmoidoscopy) you may notice spotting of blood in your stool or on the toilet paper. If you underwent a bowel prep for your procedure, you may not have a normal bowel movement for a few days.  Please Note:  You might notice some irritation and congestion in your nose or some drainage.  This is from the oxygen used during your procedure.  There is no need for concern and it should clear up in a day or so.  SYMPTOMS TO REPORT IMMEDIATELY:   Following upper endoscopy (EGD)  Vomiting of blood or coffee ground material  New chest pain or pain under the shoulder blades  Painful or persistently difficult swallowing  New shortness of breath  Fever of 100F or higher  Black, tarry-looking stools  For urgent or emergent issues, a gastroenterologist can be reached at any hour by calling [phone]. Do not use MyChart messaging for urgent concerns.    DIET:  We do recommend a small meal at first, but then you may proceed to your regular diet.  Drink plenty of fluids but you should avoid alcoholic beverages for 24 hours.  MEDICATIONS: Continue present medications.  Please see handouts given to you by your recovery nurse:  Gastritis.  FOLLOW UP: Await pathology results.  Thank you for allowing [place] to provide for your healthcare needs today.  ACTIVITY:  You should plan to take it easy for the rest of today and you should NOT DRIVE or use heavy machinery until tomorrow (because of the sedation medicines used during the test).    FOLLOW UP: Our staff will call the number listed on your records the next business day following your procedure.  We will call around 7:15- 8:00 am to check on you and address any questions or concerns that you may have regarding the information given to you following your procedure. If we do not reach you, we will leave a message.     If any biopsies were taken you will be contacted by phone or by letter within the next 1-3 weeks.  Please call us at [phone] if you have not heard about the biopsies in 3 weeks.    SIGNATURES/CONFIDENTIALITY: You and/or your care partner have signed paperwork which will be entered into your electronic medical record.  These signatures attest to the fact that that the information above on your After Visit Summary has been reviewed and is understood.  Full responsibility of the confidentiality of this discharge information lies with you and/or your care-partner.  USTED TUVO UN PROCEDIMIENTO ENDOSCPICO HOY EN EL [facility]:   Lea el informe del procedimiento que se le entreg para cualquier pregunta especfica sobre lo que se Geophysical data processor  su examen.  Si el informe del examen no responde a sus preguntas, por favor llame a su gastroenterlogo para aclararlo.  Si usted solicit que no se le den [employer] de lo que se Clinical cytogeneticist en su procedimiento al [employer] va a cuidar, entonces el informe del procedimiento se ha incluido en un sobre sellado para que usted lo revise despus cuando le sea ms conveniente.   LO QUE PUEDE ESPERAR: Algunas sensaciones de hinchazn en el abdomen.  Puede tener ms gases de lo normal.  El caminar puede  ayudarle a eliminar el aire que se le puso en el tracto gastrointestinal durante el procedimiento y reducir la hinchazn.  Si le hicieron una endoscopia inferior (como una colonoscopia o una sigmoidoscopia flexible), podra notar manchas de sangre en las heces fecales o en el papel higinico.  Si se someti a una preparacin intestinal para su procedimiento, es posible que no tenga una evacuacin intestinal normal durante [employer].   Tenga en cuenta:  Es posible que note un poco de irritacin y congestin en la nariz o algn drenaje.  Esto es debido al oxgeno [employer] durante su procedimiento.  No hay que preocuparse y esto debe desaparecer ms o Regulatory affairs officer.   SNTOMAS PARA REPORTAR INMEDIATAMENTE:  Despus de una endoscopia inferior (colonoscopia o sigmoidoscopia flexible):  Cantidades excesivas de sangre en las heces fecales  Sensibilidad significativa o empeoramiento de los dolores abdominales   Hinchazn aguda del abdomen que antes no tena   Fiebre de 100F o ms   Despus de la endoscopia superior (EGD)  Vmitos de Retail buyer o material como caf molido   Dolor en el pecho o dolor debajo de los omplatos que antes no tena   Dolor o dificultad persistente para tragar  Falta de aire que antes no tena   Fiebre de 100F o ms  Heces fecales negras y pegajosas   Para asuntos urgentes o de Associate Professor, puede comunicarse con un gastroenterlogo a cualquier hora llamando al [phone].  DIETA:  Recomendamos una comida pequea al principio, pero luego puede continuar con su dieta normal.  Tome muchos lquidos, Tax adviser las bebidas alcohlicas durante 24 horas.    ACTIVIDAD:  Debe planear tomarse las cosas con calma por el resto del da y no debe CONDUCIR ni usar maquinaria pesada Patent examiner (debido a los medicamentos de sedacin utilizados durante el examen).     SEGUIMIENTO: Nuestro personal llamar al nmero que aparece en su historial al siguiente da hbil de su  procedimiento para ver cmo se siente y para responder cualquier pregunta o inquietud que pueda tener con respecto a la informacin que se le dio despus del procedimiento. Si no podemos contactarle, le dejaremos un mensaje.  Sin embargo, si se siente bien y no tiene English as a second language teacher, no es necesario que nos devuelva la llamada.  Asumiremos que ha regresado a sus actividades diarias normales sin incidentes. Si se le tomaron algunas biopsias, le contactaremos por telfono o por carta en las prximas 3 semanas.  Si no ha sabido [employer] biopsias en el transcurso de 3 semanas, por favor llmenos al [phone].   FIRMAS/CONFIDENCIALIDAD: Usted y/o el acompaante que le cuide han firmado documentos que se ingresarn en su historial mdico electrnico.  Estas firmas atestiguan el hecho de que la informacin anterior

## 2023-11-25 NOTE — Progress Notes (Signed)
 A/o x 3, VSS, gd SR's, pleased with anesthesia, report to RN

## 2023-11-25 NOTE — Op Note (Signed)
 [facility] Patient Name: Karina Murphy Procedure Date: 11/25/2023 9:23 AM MRN: [id number] Endoscopist: Napoleon Form , MD, [id number] Age: 43 Referring MD:  Date of Birth: Jan 05, 1981 Gender: Female Account #: [id number] Procedure:                Upper GI endoscopy Indications:              Epigastric abdominal pain, Persistent vomiting of                            unknown cause Medicines:                Monitored Anesthesia Care Procedure:                Pre-Anesthesia Assessment:                           - Prior to the procedure, a History and Physical                            was performed, and patient medications and                            allergies were reviewed. The patient's tolerance of                            previous anesthesia was also reviewed. The risks                            and benefits of the procedure and the sedation                            options and risks were discussed with the patient.                            All questions were answered, and informed consent                            was obtained. Prior Anticoagulants: The patient has                            taken no anticoagulant or antiplatelet agents. ASA                            Grade Assessment: II - A patient with mild systemic                            disease. After reviewing the risks and benefits,                            the patient was deemed in satisfactory condition to                            undergo the procedure.  After obtaining informed consent, the endoscope was                            passed under direct vision. Throughout the                            procedure, the patient's blood pressure, pulse, and                            oxygen saturations were monitored continuously. The                            GIF [id number] #[id number] was introduced through the                            mouth, and advanced to the  second part of duodenum.                            The upper GI endoscopy was accomplished without                            difficulty. The patient tolerated the procedure                            well. Scope In: Scope Out: Findings:                 The Z-line was regular and was found 38 cm from the                            incisors.                           No gross lesions were noted in the entire esophagus.                           Patchy mild inflammation characterized by                            congestion (edema), erythema, friability and                            granularity was found in the entire examined                            stomach. Biopsies were taken with a cold forceps                            for Helicobacter pylori testing.                           The cardia and gastric fundus were normal on                            retroflexion.  The examined duodenum was normal. Complications:            No immediate complications. Estimated Blood Loss:     Estimated blood loss was minimal. Impression:               - Z-line regular, 38 cm from the incisors.                           - No gross lesions in the entire esophagus.                           - Gastritis. Biopsied.                           - Normal examined duodenum. Recommendation:           - Patient has a contact number available for                            emergencies. The signs and symptoms of potential                            delayed complications were discussed with the                            patient. Return to normal activities tomorrow.                            Written discharge instructions were provided to the                            patient.                           - Resume previous diet.                           - Continue present medications.                           - Await pathology results.                           - Follow an antireflux  regimen. Napoleon Form, MD 11/25/2023 9:47:49 AM This report has been signed electronically.

## 2023-11-25 NOTE — Progress Notes (Signed)
 Interpreter used today at the [facility] for this pt.  Interpreter's name is- Kathalene Frames

## 2023-11-25 NOTE — Progress Notes (Signed)
 Called to room to assist during endoscopic procedure.  Patient ID and intended procedure confirmed with present staff. Received instructions for my participation in the procedure from the performing physician.

## 2023-11-28 ENCOUNTER — Other Ambulatory Visit: Payer: Self-pay | Admitting: Internal Medicine

## 2023-11-28 ENCOUNTER — Telehealth: Payer: Self-pay

## 2023-11-28 DIAGNOSIS — Z79899 Other long term (current) drug therapy: Secondary | ICD-10-CM

## 2023-11-28 DIAGNOSIS — M069 Rheumatoid arthritis, unspecified: Secondary | ICD-10-CM

## 2023-11-28 NOTE — Telephone Encounter (Signed)
  Follow up Call-     11/25/2023    8:47 AM  Call back number  Post procedure Call Back phone  # [phone]  Permission to leave phone message Yes     Patient questions:  Do you have a fever, pain , or abdominal swelling? No. Pain Score  0 *  Have you tolerated food without any problems? Yes.    Have you been able to return to your normal activities? Yes.    Do you have any questions about your discharge instructions: Diet   No. Medications  No. Follow up visit  No.  Do you have questions or concerns about your Care? No.  Actions: * If pain score is 4 or above: No action needed, pain <4.

## 2023-11-29 LAB — SURGICAL PATHOLOGY

## 2023-11-30 ENCOUNTER — Other Ambulatory Visit: Payer: Self-pay

## 2023-12-14 NOTE — Progress Notes (Signed)
 Office Visit Note  Patient: Karina Murphy             Date of Birth: 1981/05/16           MRN: [id number]             PCP: Collins Dean, NP Referring: Collins Dean, NP Visit Date: 12/28/2023   Subjective:  Follow-up   Discussed the use of AI scribe software for clinical note transcription with the patient, who gave verbal consent to proceed.  History of Present Illness   Karina Murphy is a 43 y.o. 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    We decreased the medication dose by half after last visit due to persistent leukopenia and neutropenia. She experiences swelling in her wrists and hands. The symptoms tend to increase about two weeks after her monthly Cimzia injection. She last received an injection on December 16, 2023, and noted hand pain two days ago, which was relieved by taking two Advil.  She also mentions occasional knee pain and shoulder discomfort, particularly when her baby rests on her arm, causing soreness. The shoulder pain sometimes occurs at night, especially after carrying her baby.  Approximately two weeks ago, she experienced a gastrointestinal illness characterized by vomiting and diarrhea, which affected everyone in her household. She did not eat for three days during this period, suspecting a viral cause.  She manages acute pain episodes with Advil as needed. She uses Nexplanon for birth control and is not planning to have more children.   Encounter conducted with assistance of in person spanish language interpreted Shelagh Derrick.  Previous HPI 09/29/2023 Karina Murphy is a 43 y.o. female here for follow up for seropositive, erosive RA on Cimzia 400 mg [place] monthly. She reports a delay in receiving her arthritis medication in December, which resulted in a flare-up of symptoms, including swelling and discomfort in the hands and wrists. The patient is now back on her medication and the symptoms have  improved.   The patient also experienced a sore throat and fever one week ago, which resolved with home remedies. She denies any need for antibiotics. The patient's daughter also had a sore throat around the same time.   In addition, the patient has been experiencing dryness in the mouth and throat for the past two months. She denies having a humidifier at home and has not been using any specific treatments for this symptom.   The patient's recent blood tests showed a low white blood cell count, specifically neutrophils, which was at 600 at one point. The patient was informed that this could be due to her arthritis medication and could increase her risk of bacterial infections.   The patient denies any other swelling outside of the hands and wrists. She has been using an ointment for the discomfort in her hands.         Previous HPI 06/28/2023 Karina Murphy is a 43 y.o. female here for follow up for seropositive, erosive RA on Cimzia 400 mg [place] monthly.  Still has some persistent joint pain and stiffness most commonly MCP and PIP joints.  Worse on her left thumb recently sometimes with associated swelling or sometimes without change in appearance.  She had recent episode where her usual health was not available and had to self administer her Cimzia had some small raised bump and discoloration around the injection site for a few days.  Recent labs from October 4 with leukopenia  to 3.2 with 1000 neutrophils otherwise normal.   Previous HPI 03/28/2023 Karina Murphy is a 43 y.o. female here for follow up for seropositive, erosive RA on Cimzia 400 mg [place] weekly. Since last visit her joint pain is doing well without prolonged morning stiffness. Left shoulder pain is partially better, but has some increased wrist pain near the base of the thumbs. Frequently worsened when lifting her child. No interval infections or antibiotics. Skin rashes are ongoing on her arms, tried topical steroid that  was prescribed did not help much. Started using an OTC eczema cream that is helping partially.   Previous HPI 12/27/22 Karina Murphy is a 43 y.o. female here for follow up for seropositive RA after starting Cimzia currently finishing loading dose before transition to 400 mg subcu every 4 weeks.  Since her last visit she also discontinued the sulfasalazine due to not seeing clinical benefit from this.  So far symptoms are largely improved since starting the Cimzia with decreased joint pain and swelling.  Still has some pain and has morning stiffness every day but with decreased severity.  Has not noticed any major flareup or exacerbation from stopping the sulfasalazine.  She had an episode of viral gastroenteritis in March that resolved without major complication but has had some ongoing lower abdominal pain afterwards.  Still having skin rashes most often around her hand and wrist or foot.  Also with some eye itching and redness consistent with seasonal allergy symptoms.   Previous HPI 09/27/22 Karina Murphy is a 43 y.o. 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 She delivered her baby last month uneventfully. For about 3 weeks notices new skin rashes on her right forearm and on both legs above the knees. This is itchy and bothers her most at night time.    Previous HPI 07/30/22 Karina Murphy is a 43 y.o. female here for seropositive RA. She was previously seeing rheumatology after onset of symptoms around 2014 with hand and foot inflammation initially.  She is also experienced involvement of bilateral shoulder pain and stiffness that started later.  She was on treatment but stopped following up after her rheumatologist left that practice in 2018.  Previous treatments including methotrexate and sulfasalazine and prednisone.  She has pretty much been off any maintenance medication since that time treatments have often consisted of  intramuscular steroid injections as needed for disease flareups.  She is getting evaluation now largely due to increased healthcare system exposure with her pregnancy.  Her arthritis symptoms have overall been doing better during this pregnancy compared to before.  She is not taking any medications regularly for pain.  Worst affected area at the moment is pain and swelling in her right foot.   This encounter was conducted with assistance of in person Spanish language interpreter.   Review of Systems  Constitutional:  Negative for fatigue.  HENT:  Positive for mouth sores and mouth dryness.   Eyes:  Negative for dryness.  Respiratory:  Negative for shortness of breath.   Cardiovascular:  Negative for chest pain and palpitations.  Gastrointestinal:  Negative for blood in stool, constipation and diarrhea.  Endocrine: Negative for increased urination.  Genitourinary:  Negative for involuntary urination.  Musculoskeletal:  Positive for joint pain, joint pain and joint swelling. Negative for gait problem, myalgias, muscle weakness, morning stiffness, muscle tenderness and myalgias.  Skin:  Positive for hair loss. Negative for color change, rash and sensitivity to sunlight.  Allergic/Immunologic: Positive for susceptible to infections.  Neurological:  Positive for dizziness and headaches.  Hematological:  Negative for swollen glands.  Psychiatric/Behavioral:  Positive for sleep disturbance. Negative for depressed mood. The patient is nervous/anxious.     PMFS History:  Patient Active Problem List   Diagnosis Date Noted   Radial styloid tenosynovitis (de quervain) 03/28/2023   Allergic conjunctivitis 12/27/2022   High risk medication use 07/30/2022   Language barrier 03/31/2022   AMA (advanced maternal age) multigravida 35+ 02/04/2022   Rash and other nonspecific skin eruption 08/31/2016   Seropositive rheumatoid arthritis (HCC) 02/12/2014   Obese 12/26/2013    Past Medical History:   Diagnosis Date   Anxiety    Arthritis, rheumatoid (HCC)    Breast pain, left 09/17/2016   Chronic headaches    GERD (gastroesophageal reflux disease)    Obesity    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    Immunization History  Administered Date(s) Administered   Influenza,inj,Quad PF,6+ Mos 06/28/2014, 09/17/2016, 06/14/2022   Tdap 06/14/2022     Objective: Vital Signs: BP 109/73 (BP Location: Left Arm, Patient Position: Sitting, Cuff Size: Large)   Pulse 63   Resp 14   Ht 5\' 3"  (1.6 m)   Wt 196 lb (88.9 kg)   Breastfeeding No   BMI 34.72 kg/m    Physical Exam Eyes:     Conjunctiva/sclera: Conjunctivae normal.  Cardiovascular:     Rate and Rhythm: Normal rate and regular rhythm.  Pulmonary:     Effort: Pulmonary effort is normal.     Breath sounds: Normal breath sounds.  Musculoskeletal:     Right lower leg: No edema.     Left lower leg: No edema.  Lymphadenopathy:     Cervical: No cervical adenopathy.  Skin:    General: Skin is warm and dry.  Neurological:     Mental Status: She is alert.  Psychiatric:        Mood and Affect: Mood normal.      Musculoskeletal Exam:  Shoulders full ROM, left shoulder tenderness with pressure and overhead movement Elbows full ROM no tenderness or swelling Wrists full ROM no tenderness or swelling Fingers with partially reducible swan neck deformities, full reducible lateral deviation Knees  full ROM no tenderness or swelling Ankles full ROM no tenderness or swelling  Investigation: No additional findings.  Imaging: No results found.  Recent Labs: Lab Results  Component Value Date   WBC 3.0 (L) 09/29/2023   HGB 11.9 (L) 09/29/2023   PLT 274 09/29/2023   NA 135 09/29/2023   K 4.0 09/29/2023   CL 106 09/29/2023   CO2 22 09/29/2023   GLUCOSE 101 (H) 09/29/2023   BUN 11 09/29/2023   CREATININE 0.86 09/29/2023  BILITOT 0.9 09/29/2023   ALKPHOS 64 09/29/2023   AST 15 09/29/2023   ALT 11 09/29/2023   PROT 8.1 09/29/2023   ALBUMIN 4.0 09/29/2023   CALCIUM 9.3 09/29/2023   GFRAA 129 03/19/2020   QFTBGOLDPLUS Negative 09/29/2023    Speciality Comments: Cimzia started 11/01/2022  Procedures:  No procedures performed Allergies: Bee pollen, Tylenol [acetaminophen], Morphine, and Pineapple   Assessment / Plan:     Visit Diagnoses: Seropositive rheumatoid arthritis (HCC) - Plan: Sedimentation rate Increased symptoms after reducing Cimzia injections. Symptoms worsen two weeks post-injection. Acute pain managed with Advil. Current regimen adjusted due to low white blood cell counts. Discussed adjusting Cimzia dose to one injection every three weeks if labs improved but having breakthrough symptoms. Alternative medications available if needed since cimzia was selected primarily from obstetric considerations. - Rechecking sed rate - Consider adjusting Cimzia to one injection every three weeks if blood counts improve. - Discuss alternative medication options if Cimzia adjustment is not effective. - Continue Advil as needed for pain management.   High risk medication use - Cimzia 400 mg [place] monthly - Plan: CBC with Differential/Platelet, Comprehensive metabolic panel with GFR Tolerating injections without issues, one interval infection probably viral gastroenteritis and unrelated. Previous cytopenias concerning for drug induced. - Checking CBC and CMP medication monitoring on  cimzia   Orders: Orders Placed This Encounter  Procedures   CBC with Differential/Platelet   Comprehensive metabolic panel with GFR   Sedimentation rate   No orders of the defined types were placed in this encounter.    Follow-Up Instructions: Return in about 3 months (around 03/28/2024) for RA on CIM f/u 3mos.   Matt Song, MD  Note - This record has been created using [employer].  Chart creation errors have been sought, but may not always  have been located. Such creation errors do not reflect on  the standard of medical care.

## 2023-12-28 ENCOUNTER — Other Ambulatory Visit (HOSPITAL_COMMUNITY)
Admission: RE | Admit: 2023-12-28 | Discharge: 2023-12-28 | Disposition: A | Discharge status: Home / Self Care | Payer: Self-pay | Source: Ambulatory Visit | Attending: Internal Medicine | Admitting: Internal Medicine

## 2023-12-28 ENCOUNTER — Encounter: Payer: Self-pay | Admitting: Internal Medicine

## 2023-12-28 ENCOUNTER — Ambulatory Visit: Payer: Self-pay | Attending: Internal Medicine | Admitting: Internal Medicine

## 2023-12-28 VITALS — BP 109/73 | HR 63 | Resp 14 | Ht 63.0 in | Wt 196.0 lb

## 2023-12-28 DIAGNOSIS — M059 Rheumatoid arthritis with rheumatoid factor, unspecified: Secondary | ICD-10-CM

## 2023-12-28 DIAGNOSIS — R21 Rash and other nonspecific skin eruption: Secondary | ICD-10-CM

## 2023-12-28 DIAGNOSIS — Z79899 Other long term (current) drug therapy: Secondary | ICD-10-CM

## 2023-12-28 LAB — CBC WITH DIFFERENTIAL/PLATELET
Abs Immature Granulocytes: 0.01 10*3/uL (ref 0.00–0.07)
Basophils Absolute: 0 10*3/uL (ref 0.0–0.1)
Basophils Relative: 1 %
Eosinophils Absolute: 0.1 10*3/uL (ref 0.0–0.5)
Eosinophils Relative: 4 %
HCT: 36.1 % (ref 36.0–46.0)
Hemoglobin: 11.4 g/dL — ABNORMAL LOW (ref 12.0–15.0)
Immature Granulocytes: 0 %
Lymphocytes Relative: 55 %
Lymphs Abs: 1.8 10*3/uL (ref 0.7–4.0)
MCH: 25.6 pg — ABNORMAL LOW (ref 26.0–34.0)
MCHC: 31.6 g/dL (ref 30.0–36.0)
MCV: 80.9 fL (ref 80.0–100.0)
Monocytes Absolute: 0.6 10*3/uL (ref 0.1–1.0)
Monocytes Relative: 17 %
Neutro Abs: 0.8 10*3/uL — ABNORMAL LOW (ref 1.7–7.7)
Neutrophils Relative %: 23 %
Platelets: 267 10*3/uL (ref 150–400)
RBC: 4.46 MIL/uL (ref 3.87–5.11)
RDW: 15.4 % (ref 11.5–15.5)
WBC: 3.3 10*3/uL — ABNORMAL LOW (ref 4.0–10.5)
nRBC: 0 % (ref 0.0–0.2)

## 2023-12-28 LAB — COMPREHENSIVE METABOLIC PANEL WITH GFR
ALT: 13 U/L (ref 0–44)
AST: 16 U/L (ref 15–41)
Albumin: 3.7 g/dL (ref 3.5–5.0)
Alkaline Phosphatase: 46 U/L (ref 38–126)
Anion gap: 10 (ref 5–15)
BUN: 10 mg/dL (ref 6–20)
CO2: 25 mmol/L (ref 22–32)
Calcium: 9.1 mg/dL (ref 8.9–10.3)
Chloride: 104 mmol/L (ref 98–111)
Creatinine, Ser: 0.86 mg/dL (ref 0.44–1.00)
GFR, Estimated: >60 mL/min (ref >60)
Glucose, Bld: 96 mg/dL (ref 70–99)
Potassium: 4 mmol/L (ref 3.5–5.1)
Sodium: 139 mmol/L (ref 135–145)
Total Bilirubin: 0.7 mg/dL (ref 0.0–1.2)
Total Protein: 7.6 g/dL (ref 6.5–8.1)

## 2023-12-28 LAB — SEDIMENTATION RATE: Sed Rate: 17 mm/h (ref 0–22)

## 2023-12-29 LAB — PATHOLOGIST SMEAR REVIEW

## 2024-01-19 ENCOUNTER — Encounter: Payer: Self-pay | Admitting: Gastroenterology

## 2024-01-23 ENCOUNTER — Ambulatory Visit (HOSPITAL_COMMUNITY)
Admission: EM | Admit: 2024-01-23 | Discharge: 2024-01-23 | Disposition: A | Discharge status: Home / Self Care | Payer: Self-pay | Attending: Physician Assistant | Admitting: Physician Assistant

## 2024-01-23 ENCOUNTER — Encounter (HOSPITAL_COMMUNITY): Payer: Self-pay

## 2024-01-23 DIAGNOSIS — R0981 Nasal congestion: Secondary | ICD-10-CM | POA: Insufficient documentation

## 2024-01-23 DIAGNOSIS — J0140 Acute pansinusitis, unspecified: Secondary | ICD-10-CM | POA: Insufficient documentation

## 2024-01-23 DIAGNOSIS — R519 Headache, unspecified: Secondary | ICD-10-CM | POA: Insufficient documentation

## 2024-01-23 LAB — CBC WITH DIFFERENTIAL/PLATELET
Abs Immature Granulocytes: 0 10*3/uL (ref 0.00–0.07)
Basophils Absolute: 0 10*3/uL (ref 0.0–0.1)
Basophils Relative: 1 %
Eosinophils Absolute: 0.1 10*3/uL (ref 0.0–0.5)
Eosinophils Relative: 3 %
HCT: 40 % (ref 36.0–46.0)
Hemoglobin: 12.7 g/dL (ref 12.0–15.0)
Immature Granulocytes: 0 %
Lymphocytes Relative: 33 %
Lymphs Abs: 1.3 10*3/uL (ref 0.7–4.0)
MCH: 25.8 pg — ABNORMAL LOW (ref 26.0–34.0)
MCHC: 31.8 g/dL (ref 30.0–36.0)
MCV: 81.1 fL (ref 80.0–100.0)
Monocytes Absolute: 0.7 10*3/uL (ref 0.1–1.0)
Monocytes Relative: 17 %
Neutro Abs: 1.8 10*3/uL (ref 1.7–7.7)
Neutrophils Relative %: 46 %
Platelets: 259 10*3/uL (ref 150–400)
RBC: 4.93 MIL/uL (ref 3.87–5.11)
RDW: 15 % (ref 11.5–15.5)
WBC: 3.8 10*3/uL — ABNORMAL LOW (ref 4.0–10.5)
nRBC: 0 % (ref 0.0–0.2)

## 2024-01-23 LAB — BASIC METABOLIC PANEL WITH GFR
Anion gap: 10 (ref 5–15)
BUN: 7 mg/dL (ref 6–20)
CO2: 21 mmol/L — ABNORMAL LOW (ref 22–32)
Calcium: 9.3 mg/dL (ref 8.9–10.3)
Chloride: 106 mmol/L (ref 98–111)
Creatinine, Ser: 0.79 mg/dL (ref 0.44–1.00)
GFR, Estimated: >60 mL/min (ref >60)
Glucose, Bld: 94 mg/dL (ref 70–99)
Potassium: 3.2 mmol/L — ABNORMAL LOW (ref 3.5–5.1)
Sodium: 137 mmol/L (ref 135–145)

## 2024-01-23 LAB — SEDIMENTATION RATE: Sed Rate: 31 mm/h — ABNORMAL HIGH (ref 0–22)

## 2024-01-23 LAB — C-REACTIVE PROTEIN: CRP: 1.7 mg/dL — ABNORMAL HIGH (ref <1.0)

## 2024-01-23 MED ORDER — AMOXICILLIN-POT CLAVULANATE 875-125 MG PO TABS: 1.0000 | ORAL_TABLET | Freq: Two times a day (BID) | ORAL | 0 refills | Status: DC

## 2024-01-23 MED ORDER — PREDNISONE 10 MG (21) PO TBPK: ORAL_TABLET | ORAL | 0 refills | Status: DC

## 2024-01-23 NOTE — ED Triage Notes (Signed)
 Patient states she prefers her daughter to translate.  Patient c./o right sided headache, right facial pain, right ear pain and a non productive cough x 4 days.  Patient states she has been taking Advil  for her symptoms.

## 2024-01-23 NOTE — ED Provider Notes (Signed)
 [facility]    CSN: [id number] Arrival date & time: 01/23/24  1711      History   Chief Complaint Chief Complaint  Patient presents with   Headache   Otalgia   Facial Pain   Cough    HPI Karina Murphy is a 43 y.o. female.   Patient presents today accompanied by her daughter who provided translation after she declined video interpreter.  She reports a weeklong history of URI symptoms including congestion and cough that have worsened over the past 4 days and she is not experiencing right sided headache.  Currently pain is rated 8/9 on a 0-10 pain scale, described as pressure, no alleviating factors identified.  She does have a history of seasonal allergies but is not taking any medication for this.  She has a history of rheumatoid arthritis and is currently on TNF inhibitor.  She denies any recent antibiotics or steroids.  She has tried over-the-counter analgesics including Tylenol  and ibuprofen  without improvement of symptoms.  She denies any chest pain, shortness of breath, fever, nausea, vomiting.  This is not the worst headache of her life.  She does report some photosensitivity but denies any visual disturbance.  She denies any ocular pain or foreign body sensation in her eye.  She has no concern for pregnancy.    Past Medical History:  Diagnosis Date   Anxiety    Arthritis, rheumatoid (HCC)    Breast pain, left 09/17/2016   Chronic headaches    GERD (gastroesophageal reflux disease)    Obesity    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   Urticaria    Weakness 11/23/2016    Patient Active Problem List   Diagnosis Date Noted   Radial styloid tenosynovitis (de quervain) 03/28/2023   Allergic conjunctivitis 12/27/2022   High risk medication use 07/30/2022   Language barrier 03/31/2022   AMA (advanced maternal age) multigravida 35+ 02/04/2022   Rash and other nonspecific skin eruption 08/31/2016   Seropositive rheumatoid arthritis (HCC) 02/12/2014    Obese 12/26/2013    Past Surgical History:  Procedure Laterality Date   CHOLECYSTECTOMY  07/2020    OB History     Gravida  3   Para  3   Term  3   Preterm  0   AB  0   Living  3      SAB  0   IAB  0   Ectopic  0   Multiple  0   Live Births  3            Home Medications    Prior to Admission medications   Medication Sig Start Date End Date Taking? Authorizing Provider  amoxicillin -clavulanate (AUGMENTIN ) 875-125 MG tablet Take 1 tablet by mouth every 12 (twelve) hours. 01/23/24  Yes Shenica Holzheimer K, PA-C  predniSONE  (STERAPRED UNI-PAK 21 TAB) 10 MG (21) TBPK tablet As directed 01/23/24  Yes Anabell Swint K, PA-C  CIMZIA , 2 SYRINGE, 200 MG/ML prefilled syringe Inject 2 syringes (400mg ) under skin every four weeks as directed. Refrigerate. Protect from light. 11/08/23   Rice, Haig Levan, MD  Omega-3 Fatty Acids (OMEGA 3 PO) Take by mouth. Patient not taking: Reported on 12/28/2023    [provider]  pantoprazole  (PROTONIX ) 40 MG tablet Take 1 tablet (40 mg total) by mouth daily. For heartburn or stomach pain 06/17/23   Collins Dean, NP  triamcinolone  ointment (KENALOG ) 0.5 % Apply 1 Application topically 2 (two) times daily as needed. Patient not  taking: Reported on 11/11/2023 09/27/22   Matt Song, MD    Family History Family History  Problem Relation Age of Onset   Diabetes Mother    Hypertension Mother    Diabetes Brother    Stomach cancer Neg Hx    Colon cancer Neg Hx    Esophageal cancer Neg Hx    Pancreatic cancer Neg Hx    Asthma Neg Hx    Cancer Neg Hx    Heart disease Neg Hx    Stroke Neg Hx    Colon polyps Neg Hx    Rectal cancer Neg Hx     Social History Social History   Tobacco Use   Smoking status: Never    Passive exposure: Never   Smokeless tobacco: Never  Vaping Use   Vaping status: Never Used  Substance Use Topics   Alcohol use: No   Drug use: No     Allergies   Bee pollen, Tylenol   [acetaminophen ], Morphine , and Pineapple   Review of Systems Review of Systems  Constitutional:  Positive for activity change and chills. Negative for appetite change, fatigue and fever.  HENT:  Positive for congestion, ear pain and sinus pressure. Negative for postnasal drip, sneezing and sore throat.   Eyes:  Negative for photophobia, pain, discharge, redness, itching and visual disturbance.  Respiratory:  Positive for cough. Negative for shortness of breath.   Cardiovascular:  Negative for chest pain.  Gastrointestinal:  Negative for abdominal pain, diarrhea, nausea and vomiting.  Neurological:  Positive for headaches. Negative for dizziness, syncope, speech difficulty, weakness, light-headedness and numbness.     Physical Exam Triage Vital Signs ED Triage Vitals  Encounter Vitals Group     BP 01/23/24 1812 135/79     Systolic BP Percentile --      Diastolic BP Percentile --      Pulse Rate 01/23/24 1812 69     Resp 01/23/24 1812 16     Temp 01/23/24 1812 98.6 F (37 C)     Temp Source 01/23/24 1812 Oral     SpO2 01/23/24 1812 99 %     Weight --      Height --      Head Circumference --      Peak Flow --      Pain Score 01/23/24 1813 10     Pain Loc --      Pain Education --      Exclude from Growth Chart --    No data found.  Updated Vital Signs BP 135/79 (BP Location: Left Arm)   Pulse 69   Temp 98.6 F (37 C) (Oral)   Resp 16   SpO2 99%   Visual Acuity Right Eye Distance: 20/100 Left Eye Distance: 20/50 Bilateral Distance: 20/50  Right Eye Near:   Left Eye Near:    Bilateral Near:     Physical Exam Vitals reviewed.  Constitutional:      General: She is awake. She is not in acute distress.    Appearance: Normal appearance. She is well-developed. She is not ill-appearing.     Comments: Very pleasant female appears stated age in no acute distress sitting comfortably in exam room  HENT:     Head: Normocephalic and atraumatic. No raccoon eyes, Battle's  sign or contusion.     Right Ear: Tympanic membrane, ear canal and external ear normal. No hemotympanum.     Left Ear: Tympanic membrane, ear canal and external ear normal. No hemotympanum.  Nose: Congestion present.     Right Sinus: Maxillary sinus tenderness and frontal sinus tenderness present.     Left Sinus: No maxillary sinus tenderness or frontal sinus tenderness.     Mouth/Throat:     Tongue: Tongue does not deviate from midline.     Pharynx: Uvula midline. Postnasal drip present. No oropharyngeal exudate or posterior oropharyngeal erythema.  Eyes:     Extraocular Movements: Extraocular movements intact.     Pupils: Pupils are equal, round, and reactive to light.  Cardiovascular:     Rate and Rhythm: Normal rate and regular rhythm.     Heart sounds: Normal heart sounds, S1 normal and S2 normal. No murmur heard. Pulmonary:     Effort: Pulmonary effort is normal.     Breath sounds: Normal breath sounds. No wheezing, rhonchi or rales.     Comments: Clear to auscultation bilaterally Musculoskeletal:     Cervical back: No spinous process tenderness or muscular tenderness.     Comments: Strength 5/5 bilateral upper and lower extremities  Neurological:     General: No focal deficit present.     Mental Status: She is alert and oriented to person, place, and time.     Cranial Nerves: Cranial nerves 2-12 are intact.     Motor: Motor function is intact.     Coordination: Coordination is intact.     Gait: Gait is intact.  Psychiatric:        Behavior: Behavior is cooperative.      UC Treatments / Results  Labs (all labs ordered are listed, but only abnormal results are displayed) Labs Reviewed  CBC WITH DIFFERENTIAL/PLATELET  BASIC METABOLIC PANEL WITH GFR  SEDIMENTATION RATE  C-REACTIVE PROTEIN    EKG   Radiology No results found.  Procedures Procedures (including critical care time)  Medications Ordered in UC Medications - No data to display  Initial  Impression / Assessment and Plan / UC Course  I have reviewed the triage vital signs and the nursing notes.  Pertinent labs & imaging results that were available during my care of the patient were reviewed by me and considered in my medical decision making (see chart for details).     Patient is well-appearing, afebrile, nontoxic, nontachycardic.  Patient denies any actual ocular pain and reports that all of her pain is surrounding her eye concerning for acute sinusitis particularly given ongoing URI symptoms.  Will start Augmentin  twice daily for 7 days given her recent double worsening of symptoms.  She did report that she had temporal headache and her vision was noted to be decreased in her right eye.  I discussed this with her and that this would be potentially reason to be seen emergently, however, she reports that this is chronic and she has not seen an eye doctor recently and so declined emergent evaluation.  We will obtain basic blood work and go ahead and start prednisone  taper that would provide some management of temporal arteritis.  We discussed that she is not to take NSAIDs with this medication but can use Tylenol , Mucinex, Flonase  for additional symptom relief.  If she has abnormal inflammatory markers will need emergent referral to vascular for temporal artery biopsy.  We discussed that if she is not feeling significantly better within a few days of starting the medication she needs to be reevaluated and encouraged her to follow-up as soon as possible with her primary care.  We discussed that if anything worsens and she has eye pain, visual disturbance,  nausea, vomiting, worsening of her life, fever she needs to be seen emergently.  Strict return precautions given.  Excuse note provided.  All questions were answered to her satisfaction.  Final Clinical Impressions(s) / UC Diagnoses   Final diagnoses:  Acute non-recurrent pansinusitis  Nasal congestion  Right temporal headache      Discharge Instructions      We are treating you for a sinus infection so please start Augmentin  twice daily for 7 days.  We are also going to start some prednisone  to help with your symptoms.  Do not take NSAIDs with this medication including aspirin , ibuprofen /Advil , naproxen /Aleve .  Please follow-up with your primary care within a few days for recheck.  I will contact you if any of your blood work is abnormal.  If you have worsening headache, worsening of your life, fever, vision change, nausea, vomiting you should go to the emergency room immediately.Karina Murphy estamos tratando por una infeccin sinusal, as que por favor, comience a tomar Augmentin  dos veces al da durante [address] [place]. Tambin le recetaremos prednisona para aliviar sus sntomas. No tome AINE con este medicamento, como aspirina, ibuprofeno/Advil , naproxeno/Aleve . Por favor, consulte con su mdico de cabecera en unos das para una nueva evaluacin. Me pondr en contacto con usted si algn anlisis de sangre presenta un resultado anormal. Si presenta empeoramiento del dolor de cabeza, empeoramiento de su vida, fiebre, cambios en la visin, nuseas o vmitos, debe acudir a urgencias de inmediato.   ED Prescriptions     Medication Sig Dispense Auth. Provider   amoxicillin -clavulanate (AUGMENTIN ) 875-125 MG tablet Take 1 tablet by mouth every 12 (twelve) hours. 14 tablet Azoria Abbett K, PA-C   predniSONE  (STERAPRED UNI-PAK 21 TAB) 10 MG (21) TBPK tablet As directed 21 tablet Jamaira Sherk K, PA-C      PDMP not reviewed this encounter.   Budd Cargo, PA-C 01/23/24 1941

## 2024-01-23 NOTE — Discharge Instructions (Addendum)
 We are treating you for a sinus infection so please start Augmentin  twice daily for 7 days.  We are also going to start some prednisone  to help with your symptoms.  Do not take NSAIDs with this medication including aspirin , ibuprofen /Advil , naproxen /Aleve .  Please follow-up with your primary care within a few days for recheck.  I will contact you if any of your blood work is abnormal.  If you have worsening headache, worsening of your life, fever, vision change, nausea, vomiting you should go to the emergency room immediately.Karina Murphy estamos tratando por una infeccin sinusal, as que por favor, comience a tomar Augmentin  dos veces al da durante 7 [place]. Tambin le recetaremos prednisona para aliviar sus sntomas. No tome AINE con este medicamento, como aspirina, ibuprofeno/Advil , naproxeno/Aleve . Por favor, consulte con su mdico de cabecera en unos das para una nueva evaluacin. Me pondr en contacto con usted si algn anlisis de sangre presenta un resultado anormal. Si presenta empeoramiento del dolor de cabeza, empeoramiento de su vida, fiebre, cambios en la visin, nuseas o vmitos, debe acudir a urgencias de inmediato.

## 2024-01-24 ENCOUNTER — Ambulatory Visit (HOSPITAL_COMMUNITY): Payer: Self-pay | Admitting: Physician Assistant

## 2024-01-24 ENCOUNTER — Other Ambulatory Visit: Payer: Self-pay

## 2024-01-24 ENCOUNTER — Telehealth: Payer: Self-pay

## 2024-01-24 MED ORDER — POTASSIUM CHLORIDE ER 10 MEQ PO TBCR: 10.0000 meq | EXTENDED_RELEASE_TABLET | Freq: Every day | ORAL | 0 refills | Status: DC

## 2024-01-24 MED ORDER — POTASSIUM CHLORIDE ER 10 MEQ PO TBCR
10.0000 meq | EXTENDED_RELEASE_TABLET | Freq: Every day | ORAL | 0 refills | Status: DC
  Filled 2024-01-24: qty 5, 5d supply, fill #0

## 2024-01-24 MED ORDER — PREDNISONE 10 MG (21) PO TBPK
ORAL_TABLET | ORAL | 0 refills | Status: DC
  Filled 2024-01-24: qty 21, 6d supply, fill #0

## 2024-01-25 ENCOUNTER — Other Ambulatory Visit: Payer: Self-pay

## 2024-02-06 NOTE — Progress Notes (Unsigned)
   Acute Office Visit  Subjective:     Patient ID: Karina Murphy, female    DOB: August 24, 1981, 43 y.o.   MRN: [id number]  No chief complaint on file.   HPI Patient is in today for right sided head pain  in UC 01/23/24 for sinusitis unimproved with augmentin  and pred pulse   ROS      Objective:    There were no vitals taken for this visit. {Vitals History (Optional):23777}  Physical Exam  No results found for any visits on 02/09/24.      Assessment & Plan:   Problem List Items Addressed This Visit   None   No orders of the defined types were placed in this encounter.   No follow-ups on file.  Arlene Lacy, MD

## 2024-02-09 ENCOUNTER — Ambulatory Visit: Payer: Self-pay | Attending: Critical Care Medicine | Admitting: Critical Care Medicine

## 2024-02-09 ENCOUNTER — Encounter: Payer: Self-pay | Admitting: Critical Care Medicine

## 2024-02-09 ENCOUNTER — Other Ambulatory Visit: Payer: Self-pay

## 2024-02-09 VITALS — BP 101/68 | HR 79 | Ht 63.0 in | Wt 196.4 lb

## 2024-02-09 DIAGNOSIS — M059 Rheumatoid arthritis with rheumatoid factor, unspecified: Secondary | ICD-10-CM

## 2024-02-09 DIAGNOSIS — K219 Gastro-esophageal reflux disease without esophagitis: Secondary | ICD-10-CM | POA: Insufficient documentation

## 2024-02-09 DIAGNOSIS — J019 Acute sinusitis, unspecified: Secondary | ICD-10-CM | POA: Insufficient documentation

## 2024-02-09 DIAGNOSIS — R1013 Epigastric pain: Secondary | ICD-10-CM

## 2024-02-09 DIAGNOSIS — Z79899 Other long term (current) drug therapy: Secondary | ICD-10-CM

## 2024-02-09 DIAGNOSIS — D72819 Decreased white blood cell count, unspecified: Secondary | ICD-10-CM

## 2024-02-09 DIAGNOSIS — J0110 Acute frontal sinusitis, unspecified: Secondary | ICD-10-CM

## 2024-02-09 DIAGNOSIS — E876 Hypokalemia: Secondary | ICD-10-CM | POA: Insufficient documentation

## 2024-02-09 MED ORDER — PANTOPRAZOLE SODIUM 40 MG PO TBEC
40.0000 mg | DELAYED_RELEASE_TABLET | Freq: Two times a day (BID) | ORAL | 1 refills | Status: DC
  Filled 2024-02-09 (×2): qty 180, 90d supply, fill #0

## 2024-02-09 NOTE — Assessment & Plan Note (Signed)
Will refill pantoprazole

## 2024-02-09 NOTE — Assessment & Plan Note (Signed)
 Plan will be for the patient to receive a CBC and follow-up and continue current therapy per rheumatology

## 2024-02-09 NOTE — Assessment & Plan Note (Signed)
 Hyperkalemia documented in urgent care received potassium supplement will plan to repeat metabolic panel

## 2024-02-09 NOTE — Assessment & Plan Note (Signed)
 Has slowly resolved exam is normal at this visit no further antibiotics needed

## 2024-02-09 NOTE — Patient Instructions (Signed)
 Increase pantoprazole  to twice daily before meals refills given  Stay on your rheumatology medication injections monthly  Labs today will include blood counts and check your potassium  Your sinus infection has resolved  Return to see your primary care Ms. Fleming in 4 months

## 2024-02-10 ENCOUNTER — Ambulatory Visit: Payer: Self-pay | Admitting: Critical Care Medicine

## 2024-02-10 LAB — CBC WITH DIFFERENTIAL/PLATELET
Basophils Absolute: 0 10*3/uL (ref 0.0–0.2)
Basos: 1 %
EOS (ABSOLUTE): 0.1 10*3/uL (ref 0.0–0.4)
Eos: 4 %
Hematocrit: 37.4 % (ref 34.0–46.6)
Hemoglobin: 11.8 g/dL (ref 11.1–15.9)
Immature Grans (Abs): 0 10*3/uL (ref 0.0–0.1)
Immature Granulocytes: 0 %
Lymphocytes Absolute: 1.4 10*3/uL (ref 0.7–3.1)
Lymphs: 40 %
MCH: 26 pg — ABNORMAL LOW (ref 26.6–33.0)
MCHC: 31.6 g/dL (ref 31.5–35.7)
MCV: 83 fL (ref 79–97)
Monocytes Absolute: 0.6 10*3/uL (ref 0.1–0.9)
Monocytes: 17 %
Neutrophils Absolute: 1.3 10*3/uL — ABNORMAL LOW (ref 1.4–7.0)
Neutrophils: 38 %
Platelets: 262 10*3/uL (ref 150–450)
RBC: 4.53 x10E6/uL (ref 3.77–5.28)
RDW: 14.4 % (ref 11.7–15.4)
WBC: 3.5 10*3/uL (ref 3.4–10.8)

## 2024-02-10 LAB — BMP8+EGFR
BUN/Creatinine Ratio: 17 (ref 9–23)
BUN: 14 mg/dL (ref 6–24)
CO2: 20 mmol/L (ref 20–29)
Calcium: 9.2 mg/dL (ref 8.7–10.2)
Chloride: 102 mmol/L (ref 96–106)
Creatinine, Ser: 0.81 mg/dL (ref 0.57–1.00)
Glucose: 96 mg/dL (ref 70–99)
Potassium: 4.1 mmol/L (ref 3.5–5.2)
Sodium: 138 mmol/L (ref 134–144)
eGFR: 93 mL/min/{1.73_m2} (ref >59)

## 2024-02-20 ENCOUNTER — Other Ambulatory Visit: Payer: Self-pay

## 2024-02-26 ENCOUNTER — Ambulatory Visit (HOSPITAL_COMMUNITY)
Admission: EM | Admit: 2024-02-26 | Discharge: 2024-02-26 | Disposition: A | Discharge status: Home / Self Care | Payer: Self-pay | Attending: Physician Assistant | Admitting: Physician Assistant

## 2024-02-26 ENCOUNTER — Other Ambulatory Visit: Payer: Self-pay

## 2024-02-26 ENCOUNTER — Encounter (HOSPITAL_COMMUNITY): Payer: Self-pay | Admitting: Emergency Medicine

## 2024-02-26 DIAGNOSIS — J029 Acute pharyngitis, unspecified: Secondary | ICD-10-CM | POA: Insufficient documentation

## 2024-02-26 LAB — POCT RAPID STREP A (OFFICE): Rapid Strep A Screen: NEGATIVE

## 2024-02-26 MED ORDER — CETIRIZINE HCL 10 MG PO TABS
10.0000 mg | ORAL_TABLET | Freq: Every day | ORAL | 0 refills | Status: DC
  Filled 2024-02-26: qty 30, 30d supply, fill #0

## 2024-02-26 MED ORDER — LIDOCAINE VISCOUS HCL 2 % MT SOLN
15.0000 mL | Freq: Four times a day (QID) | OROMUCOSAL | 0 refills | Status: DC | PRN
  Filled 2024-02-26: qty 100, 2d supply, fill #0

## 2024-02-26 NOTE — ED Provider Notes (Signed)
 [facility]    CSN: [id number] Arrival date & time: 02/26/24  1010      History   Chief Complaint Chief Complaint  Patient presents with   Sore Throat    HPI Karina Murphy is a 43 y.o. female.   Patient presents today companied by her daughter who provided translation after they declined a video interpreter.  Patient presents today with a 5-day history of URI symptoms including hoarseness, sore throat, occasional cough.  She denies any fever, chest pain, shortness of breath, nausea, vomiting.  Reports her daughter was sick with similar symptoms before she became sick.  She has had multiple episodes of acute sinusitis recently and was last treated with antibiotics 60 days ago.  Denies additional antibiotics or steroids in the past 90 days.  She does have a history of rheumatoid arthritis and is on immune modulating medication.  She is confident that she is not pregnant.  She denies any history of asthma, COPD, smoking.  She has been able to eat and drink despite the symptoms that is painful and so does report decreased oral intake.    Past Medical History:  Diagnosis Date   Anxiety    Arthritis, rheumatoid (HCC)    Breast pain, left 09/17/2016   Chronic headaches    GERD (gastroesophageal reflux disease)    Obesity    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   Urticaria    Weakness 11/23/2016    Patient Active Problem List   Diagnosis Date Noted   Sinusitis, acute 02/09/2024   Hypokalemia 02/09/2024   Acid reflux disease 02/09/2024   Radial styloid tenosynovitis (de quervain) 03/28/2023   Allergic conjunctivitis 12/27/2022   High risk medication use 07/30/2022   Language barrier 03/31/2022   AMA (advanced maternal age) multigravida 35+ 02/04/2022   Rash and other nonspecific skin eruption 08/31/2016   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 Authorizing Provider  cetirizine  (ZYRTEC  ALLERGY) 10 MG tablet Take 1 tablet (10 mg total) by mouth daily. 02/26/24  Yes Estephanie Hubbs K, PA-C  lidocaine  (XYLOCAINE ) 2 % solution Use as directed 15 mLs in the mouth or throat every 6 (six) hours as needed for mouth pain. 02/26/24  Yes Dimitrios Balestrieri K, PA-C  CIMZIA , 2 SYRINGE, 200 MG/ML prefilled syringe Inject 2 syringes (400mg ) under skin every four weeks as directed. Refrigerate. Protect from light. 11/08/23   Rice, Haig Levan, MD  pantoprazole  (PROTONIX ) 40 MG tablet Take 1 tablet (40 mg total) by mouth 2 (two) times daily before a meal. For heartburn or stomach pain 02/09/24   Vernell Goldsmith, MD    Family History Family History  Problem Relation Age of Onset   Diabetes Mother    Hypertension Mother    Diabetes Brother    Stomach cancer Neg Hx    Colon cancer Neg  Hx    Esophageal cancer Neg Hx    Pancreatic cancer Neg Hx    Asthma Neg Hx    Cancer Neg Hx    Heart disease Neg Hx    Stroke Neg Hx    Colon polyps Neg Hx    Rectal cancer Neg Hx     Social History Social History   Tobacco Use   Smoking status: Never    Passive exposure: Never   Smokeless tobacco: Never  Vaping Use   Vaping status: Never Used  Substance Use Topics   Alcohol use: No   Drug use: No     Allergies   Bee pollen, Tylenol  [acetaminophen ], Morphine , and Pineapple   Review of Systems Review of Systems  Constitutional:  Positive for activity change. Negative for appetite change, fatigue and fever.  HENT:  Positive for congestion, ear pain, sore throat, trouble swallowing and voice change. Negative for sinus pressure and sneezing.   Respiratory:  Positive  for cough. Negative for shortness of breath.   Cardiovascular:  Negative for chest pain.  Gastrointestinal:  Negative for abdominal pain, diarrhea, nausea and vomiting.  Neurological:  Negative for dizziness, light-headedness and headaches.     Physical Exam Triage Vital Signs ED Triage Vitals [02/26/24 1035]  Encounter Vitals Group     BP 132/84     Girls Systolic BP Percentile      Girls Diastolic BP Percentile      Boys Systolic BP Percentile      Boys Diastolic BP Percentile      Pulse Rate 88     Resp 18     Temp 98 F (36.7 C)     Temp Source Oral     SpO2 97 %     Weight      Height      Head Circumference      Peak Flow      Pain Score 10     Pain Loc      Pain Education      Exclude from Growth Chart    No data found.  Updated Vital Signs BP 132/84 (BP Location: Right Arm)   Pulse 88   Temp 98 F (36.7 C) (Oral)   Resp 18   SpO2 97%   Visual Acuity Right Eye Distance:   Left Eye Distance:   Bilateral Distance:    Right Eye Near:   Left Eye Near:    Bilateral Near:     Physical Exam Vitals reviewed.  Constitutional:      General: She is awake. She is not in acute distress.    Appearance: Normal appearance. She is well-developed. She is not ill-appearing.     Comments: Very pleasant female appears stated age in no acute distress sitting comfortably in exam room  HENT:     Head: Normocephalic and atraumatic.     Right Ear: Tympanic membrane, ear canal and external ear normal. Tympanic membrane is not erythematous or bulging.     Left Ear: Tympanic membrane, ear canal and external ear normal. Tympanic membrane is not erythematous or bulging.     Nose:     Right Sinus: No maxillary sinus tenderness or frontal sinus tenderness.     Left Sinus: No maxillary sinus tenderness or frontal sinus tenderness.     Mouth/Throat:     Pharynx: Uvula midline. Posterior oropharyngeal erythema and postnasal drip present. No oropharyngeal exudate.     Tonsils: No  tonsillar exudate or tonsillar abscesses.  Cardiovascular:     Rate and Rhythm: Normal rate and regular rhythm.     Heart sounds: Normal heart sounds, S1 normal and S2 normal. No murmur heard. Pulmonary:     Effort: Pulmonary effort is normal.     Breath sounds: Normal breath sounds. No wheezing, rhonchi or rales.     Comments: Clear to auscultation bilaterally Lymphadenopathy:     Head:     Right side of head: No submental, submandibular or tonsillar adenopathy.     Left side of head: No submental, submandibular or tonsillar adenopathy.     Cervical: Cervical adenopathy present.     Right cervical: Superficial cervical adenopathy present.   Psychiatric:        Behavior: Behavior is cooperative.      UC Treatments / Results  Labs (all labs ordered are listed, but only abnormal results are displayed) Labs Reviewed  CULTURE, GROUP A STREP [facility])  POCT RAPID STREP A (OFFICE)    EKG   Radiology No results found.  Procedures Procedures (including critical care time)  Medications Ordered in UC Medications - No data to display  Initial Impression / Assessment and Plan / UC Course  I have reviewed the triage vital signs and the nursing notes.  Pertinent labs & imaging results that were available during my care of the patient were reviewed by me and considered in my medical decision making (see chart for details).     Patient is well-appearing, afebrile, nontoxic, nontachycardic.  Strep testing was obtained and was negative in clinic.  Will send this for culture but defer antibiotics until culture results are available.  No evidence of acute infection on physical exam that warrant initiation of antibiotics.  Viral testing for flu and COVID were deferred as she has already been symptomatic for 5 days as would not change our management.  I suspect his symptoms are related to viral pharyngitis versus seasonal allergies.  Will start cetirizine  to help with congestion.  She was  also given viscous lidocaine  with instruction to gargle and spit this out every 6 hours as needed.  She is not to eat or drink immediately after using this medication as it can increase the risk of choking.  Recommend close follow-up with either primary care or our clinic if her symptoms do not resolve quickly.  We discussed that if anything worsens or changes and she has high fever, swelling of her throat, shortness of breath, muffled voice she is to be seen emergently.  Strict return precautions given.  Final Clinical Impressions(s) / UC Diagnoses   Final diagnoses:  Viral pharyngitis  Sore throat     Discharge Instructions      You tested negative for strep.  We are sending your strep swab off for culture.  If this requires antibiotics we will contact you to start medication.  Gargle with warm salt water  multiple times a day.  Take over-the-counter medicine such as Tylenol  and ibuprofen .  Start cetirizine  to help with congestion.  I have also called in the viscous lidocaine  to help with the sore throat.  Do not eat or drink immediately after using this medication as it increases the risk of choking.  You should gargle and then spit this out.  I would like you to follow-up with either our clinic or primary care next week if your symptoms have not resolved.  If anything worsens you have increasing pain, difficulty swallowing, swelling of your throat, shortness of breath, high fever you need to be  seen immediately.  Su prueba de estreptococos dio negativo. Le enviaremos su muestra de estreptococos para cultivo. Si requiere antibiticos, nos pondremos en contacto con usted para iniciar el tratamiento. Haga grgaras con agua tibia con sal varias veces al da. Tome medicamentos de venta libre como Tylenol  e ibuprofeno. Comience a tomar cetirizina para Scientist, research (physical sciences). Tambin le he recetado lidocana viscosa para Chief Technology Officer de Advertising copywriter. No coma ni beba inmediatamente despus de usar [place],  ya que aumenta el riesgo de Public house manager. Debe hacer grgaras y luego escupirlo. Si sus sntomas no se han resuelto, le agradeceramos que consultara con nuestra clnica o con su mdico de cabecera la prxima semana. Si algo empeora, presenta dolor, dificultad para tragar, inflamacin de garganta, falta de aire y fiebre alta, debe ser examinado de inmediato.      ED Prescriptions     Medication Sig Dispense Auth. Provider   lidocaine  (XYLOCAINE ) 2 % solution Use as directed 15 mLs in the mouth or throat every 6 (six) hours as needed for mouth pain. 100 mL Chaunta Bejarano K, PA-C   cetirizine  (ZYRTEC  ALLERGY) 10 MG tablet Take 1 tablet (10 mg total) by mouth daily. 30 tablet Selby Foisy K, PA-C      PDMP not reviewed this encounter.   Budd Cargo, PA-C 02/26/24 1147

## 2024-02-26 NOTE — ED Triage Notes (Signed)
 Pt reports sore throat and horse voice since Wednesday.

## 2024-02-26 NOTE — Discharge Instructions (Signed)
 You tested negative for strep.  We are sending your strep swab off for culture.  If this requires antibiotics we will contact you to start medication.  Gargle with warm salt water  multiple times a day.  Take over-the-counter medicine such as Tylenol  and ibuprofen .  Start cetirizine  to help with congestion.  I have also called in the viscous lidocaine  to help with the sore throat.  Do not eat or drink immediately after using this medication as it increases the risk of choking.  You should gargle and then spit this out.  I would like you to follow-up with either our clinic or primary care next week if your symptoms have not resolved.  If anything worsens you have increasing pain, difficulty swallowing, swelling of your throat, shortness of breath, high fever you need to be seen immediately.  Su prueba de estreptococos dio negativo. Le enviaremos su muestra de estreptococos para cultivo. Si requiere antibiticos, nos pondremos en contacto con usted para iniciar el tratamiento. Haga grgaras con agua tibia con sal varias veces al da. Tome medicamentos de venta libre como Tylenol  e ibuprofeno. Comience a tomar cetirizina para Scientist, research (physical sciences). Tambin le he recetado lidocana viscosa para Chief Technology Officer de Advertising copywriter. No coma ni beba inmediatamente despus de usar [place], ya que aumenta el riesgo de Public house manager. Debe hacer grgaras y luego escupirlo. Si sus sntomas no se han resuelto, le agradeceramos que consultara con nuestra clnica o con su mdico de cabecera la prxima semana. Si algo empeora, presenta dolor, dificultad para tragar, inflamacin de garganta, falta de aire y fiebre alta, debe ser examinado de inmediato.

## 2024-02-27 ENCOUNTER — Other Ambulatory Visit: Payer: Self-pay

## 2024-02-28 ENCOUNTER — Other Ambulatory Visit: Payer: Self-pay

## 2024-02-28 ENCOUNTER — Ambulatory Visit
Admission: EM | Admit: 2024-02-28 | Discharge: 2024-02-28 | Disposition: A | Discharge status: Home / Self Care | Payer: Self-pay | Attending: Physician Assistant | Admitting: Physician Assistant

## 2024-02-28 ENCOUNTER — Encounter: Payer: Self-pay | Admitting: Emergency Medicine

## 2024-02-28 ENCOUNTER — Ambulatory Visit (HOSPITAL_COMMUNITY): Payer: Self-pay

## 2024-02-28 DIAGNOSIS — J0120 Acute ethmoidal sinusitis, unspecified: Secondary | ICD-10-CM

## 2024-02-28 LAB — CULTURE, GROUP A STREP (THRC)

## 2024-02-28 MED ORDER — BENZONATATE 100 MG PO CAPS
100.0000 mg | ORAL_CAPSULE | Freq: Four times a day (QID) | ORAL | 0 refills | Status: DC | PRN
  Filled 2024-02-28: qty 30, 8d supply, fill #0

## 2024-02-28 MED ORDER — AMOXICILLIN-POT CLAVULANATE 875-125 MG PO TABS
1.0000 | ORAL_TABLET | Freq: Two times a day (BID) | ORAL | 0 refills | Status: DC
Start: 2024-02-28 — End: 2024-03-19
  Filled 2024-02-28: qty 20, 10d supply, fill #0

## 2024-02-28 NOTE — ED Triage Notes (Signed)
 Pt here for cough and bilateral ear pain with congestion and swollen eyes; pt sts sx x 1 week worse today

## 2024-02-28 NOTE — ED Provider Notes (Signed)
 [facility]    CSN: [id number] Arrival date & time: 02/28/24  1331      History   Chief Complaint Chief Complaint  Patient presents with   Cough   Otalgia    HPI Karina Murphy is a 43 y.o. female.   Pt complains of a cough, sins pressure, green drainage and a sore throat.  Pt received medication 2 days ago with no relief.  Pt has swollen areas to her neck.  Pt also complains of bilat eye redness and discomfort.  Pt has had symptoms for 7 days   The history is provided by the patient and a relative. No language interpreter was used.  Cough Associated symptoms: ear pain   Otalgia Associated symptoms: cough     Past Medical History:  Diagnosis Date   Anxiety    Arthritis, rheumatoid (HCC)    Breast pain, left 09/17/2016   Chronic headaches    GERD (gastroesophageal reflux disease)    Obesity    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   Urticaria    Weakness 11/23/2016    Patient Active Problem List   Diagnosis Date Noted   Sinusitis, acute 02/09/2024   Hypokalemia 02/09/2024   Acid reflux disease 02/09/2024   Radial styloid tenosynovitis (de quervain) 03/28/2023   Allergic conjunctivitis 12/27/2022   High risk medication use 07/30/2022   Language barrier 03/31/2022   AMA (advanced maternal age) multigravida 35+ 02/04/2022   Rash and other nonspecific skin eruption 08/31/2016   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 Authorizing Provider  cetirizine  (ZYRTEC  ALLERGY) 10 MG tablet Take 1 tablet (10 mg total) by mouth daily. 02/26/24   Raspet,  Betsey Brow, PA-C  CIMZIA , 2 SYRINGE, 200 MG/ML prefilled syringe Inject 2 syringes (400mg ) under skin every four weeks as directed. Refrigerate. Protect from light. 11/08/23   Rice, Haig Levan, MD  lidocaine  (XYLOCAINE ) 2 % solution Use as directed 15 mLs in the mouth or throat every 6 (six) hours as needed for mouth pain. 02/26/24   Raspet, Erin K, PA-C  pantoprazole  (PROTONIX ) 40 MG tablet Take 1 tablet (40 mg total) by mouth 2 (two) times daily before a meal. For heartburn or stomach pain 02/09/24   Vernell Goldsmith, MD    Family History Family History  Problem Relation Age of Onset   Diabetes Mother    Hypertension Mother    Diabetes Brother    Stomach cancer Neg Hx    Colon cancer Neg Hx    Esophageal cancer Neg Hx    Pancreatic cancer Neg Hx    Asthma Neg Hx    Cancer Neg Hx    Heart disease Neg Hx    Stroke Neg Hx    Colon polyps Neg Hx    Rectal cancer Neg Hx     Social History Social History  Tobacco Use   Smoking status: Never    Passive exposure: Never   Smokeless tobacco: Never  Vaping Use   Vaping status: Never Used  Substance Use Topics   Alcohol use: No   Drug use: No     Allergies   Bee pollen, Tylenol  [acetaminophen ], Morphine , and Pineapple   Review of Systems Review of Systems  HENT:  Positive for ear pain.   Respiratory:  Positive for cough.   All other systems reviewed and are negative.    Physical Exam Triage Vital Signs ED Triage Vitals  Encounter Vitals Group     BP 02/28/24 1401 (!) 142/89     Girls Systolic BP Percentile --      Girls Diastolic BP Percentile --      Boys Systolic BP Percentile --      Boys Diastolic BP Percentile --      Pulse Rate 02/28/24 1401 95     Resp 02/28/24 1401 18     Temp 02/28/24 1401 98.5 F (36.9 C)     Temp Source 02/28/24 1401 Oral     SpO2 02/28/24 1401 98 %     Weight --      Height --      Head Circumference --      Peak Flow --      Pain Score 02/28/24 1403 8     Pain Loc --      Pain  Education --      Exclude from Growth Chart --    No data found.  Updated Vital Signs BP (!) 142/89 (BP Location: Left Arm)   Pulse 95   Temp 98.5 F (36.9 C) (Oral)   Resp 18   SpO2 98%   Visual Acuity Right Eye Distance:   Left Eye Distance:   Bilateral Distance:    Right Eye Near:   Left Eye Near:    Bilateral Near:     Physical Exam Vitals and nursing note reviewed.  Constitutional:      Appearance: She is well-developed.  HENT:     Head: Normocephalic.     Comments: Tender maxillary sinuses     Right Ear: Tympanic membrane normal.     Left Ear: Tympanic membrane normal.     Mouth/Throat:     Mouth: Mucous membranes are moist.     Pharynx: Posterior oropharyngeal erythema present.   Eyes:     Comments: Injected conjunctiva   Cardiovascular:     Rate and Rhythm: Normal rate.  Pulmonary:     Effort: Pulmonary effort is normal.  Abdominal:     General: There is no distension.   Musculoskeletal:        General: Normal range of motion.     Cervical back: Normal range of motion.   Skin:    General: Skin is warm.   Neurological:     General: No focal deficit present.     Mental Status: She is alert and oriented to person, place, and time.   Psychiatric:        Mood and Affect: Mood normal.      UC Treatments / Results  Labs (all labs ordered are listed, but only abnormal results are displayed) Labs Reviewed - No data to display  EKG   Radiology No results found.  Procedures Procedures (including critical care time)  Medications Ordered in UC Medications - No data to display  Initial Impression / Assessment and Plan / UC Course  I have reviewed  the triage vital signs and the nursing notes.  Pertinent labs & imaging results that were available during my care of the patient were reviewed by me and considered in my medical decision making (see chart for details).     Pt has had sinus symptoms for the past week.  Pt given rx for augmentin   and tessalon  Final Clinical Impressions(s) / UC Diagnoses   Final diagnoses:  Acute non-recurrent ethmoidal sinusitis   Discharge Instructions   None    ED Prescriptions     Medication Sig Dispense Auth. Provider   amoxicillin -clavulanate (AUGMENTIN ) 875-125 MG tablet Take 1 tablet by mouth 2 (two) times daily. 20 tablet Nakyra Bourn K, PA-C   benzonatate  (TESSALON  PERLES) 100 MG capsule Take 1 capsule (100 mg total) by mouth every 6 (six) hours as needed for cough. 30 capsule Timica Marcom K, PA-C      PDMP not reviewed this encounter.   Sandi Crosby, PA-C 02/28/24 1426

## 2024-03-06 ENCOUNTER — Other Ambulatory Visit: Payer: Self-pay

## 2024-03-06 MED ORDER — AMOXICILLIN 500 MG PO CAPS
500.0000 mg | ORAL_CAPSULE | Freq: Three times a day (TID) | ORAL | 1 refills | Status: DC
  Filled 2024-03-06: qty 21, 7d supply, fill #0

## 2024-03-09 ENCOUNTER — Ambulatory Visit: Payer: Self-pay | Admitting: Internal Medicine

## 2024-03-09 ENCOUNTER — Other Ambulatory Visit: Payer: Self-pay | Admitting: Internal Medicine

## 2024-03-09 ENCOUNTER — Other Ambulatory Visit: Payer: Self-pay

## 2024-03-09 DIAGNOSIS — M069 Rheumatoid arthritis, unspecified: Secondary | ICD-10-CM

## 2024-03-09 DIAGNOSIS — Z79899 Other long term (current) drug therapy: Secondary | ICD-10-CM

## 2024-03-09 NOTE — Telephone Encounter (Signed)
 Last Fill: 11/08/2023  Labs: 02/09/2024 MCH 26.0 Neutrophils 1.3   TB Gold: 09/29/2023 TB Gold Negative  Next Visit: 03/29/2024  Last Visit: 12/28/2023  DX: Seropositive rheumatoid arthritis   Current Dose per office note 12/28/2023: Cimzia  400 mg  monthly   Okay to refill Cimzia ?

## 2024-03-09 NOTE — Progress Notes (Signed)
 I sent a refill for cimzia  at the once monthly dosing schedule due to persistent neutropenia and frequent URI, we can discuss if alternative medication might be better option in more detail at our next follow up.

## 2024-03-15 NOTE — Progress Notes (Signed)
 Office Visit Note  Patient: Karina Murphy             Date of Birth: [birth year]           MRN: [id number]             PCP: Theotis Haze ORN, NP Referring: Theotis Haze ORN, NP Visit Date: 03/29/2024 Interpretor: Marsa CLEMENTEEN CELLAR [employer]  Subjective:  Follow-up (Patient has been waking up with swelling in her hands and pain in her left shoulder and in the right forearm. )   Discussed the use of AI scribe software for clinical note transcription with the patient, who gave verbal consent to proceed.  History of Present Illness   Karina Murphy is a 43 y.o. 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    She has experienced increased joint pain and swelling in her hands, which she attributes to a decrease in her medication.  We previously decreased her dosing due to cytopenias and increased frequency of infections.  She was scheduled for an injection on July 4th but postponed it due to recurrent infections, having had three infections in the past month. She is uncertain about the appropriate waiting period after completing antibiotics before resuming her injections.  She is currently managing her joint pain with Advil , taking two tablets of 220 mg Liquigel, which she finds effective.  She does see some increased swelling in multiple joints.  She also experienced knee pain last week.      Previous HPI 12/28/2023 Karina Murphy is a 43 y.o. female here for follow up for seropositive, erosive RA on Cimzia  200 mg [place] monthly.     We decreased the medication dose by half after last visit due to persistent leukopenia and neutropenia. She experiences swelling in her wrists and hands. The symptoms tend to increase about two weeks after her monthly Cimzia  injection. She last received an injection on December 16, 2023, and noted hand pain two days ago, which was relieved by taking two Advil .   She also mentions occasional knee pain and shoulder  discomfort, particularly when her baby rests on her arm, causing soreness. The shoulder pain sometimes occurs at night, especially after carrying her baby.   Approximately two weeks ago, she experienced a gastrointestinal illness characterized by vomiting and diarrhea, which affected everyone in her household. She did not eat for three days during this period, suspecting a viral cause.   She manages acute pain episodes with Advil  as needed. She uses Nexplanon for birth control and is not planning to have more children.    Encounter conducted with assistance of in person spanish language interpreted [place].   Previous HPI 09/29/2023 Karina Murphy is a 43 y.o. 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 She reports a delay in receiving her arthritis medication in December, which resulted in a flare-up of symptoms, including swelling and discomfort in the hands and wrists. The patient is now back on her medication and the symptoms have improved.   The patient also experienced a sore throat and fever one week ago, which resolved with home remedies. She denies any need for antibiotics. The patient's daughter also had a sore throat around the same time.   In addition, the patient has been experiencing dryness in the mouth and throat for the past two months. She denies having a humidifier at home and has not been using any specific treatments for this  symptom.   The patient's recent blood tests showed a low white blood cell count, specifically neutrophils, which was at 600 at one point. The patient was informed that this could be due to her arthritis medication and could increase her risk of bacterial infections.   The patient denies any other swelling outside of the hands and wrists. She has been using an ointment for the discomfort in her hands.         Previous HPI 06/28/2023 Karina Murphy is a 43 y.o. 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  Still has some persistent joint pain and stiffness most commonly MCP and PIP joints.  Worse on her left thumb recently sometimes with associated swelling or sometimes without change in appearance.  She had recent episode where her usual health was not available and had to self administer her Cimzia  had some small raised bump and discoloration around the injection site for a few days.  Recent labs from October 4 with leukopenia to 3.2 with 1000 neutrophils otherwise normal.   Previous HPI 03/28/2023 Karina Murphy is a 43 y.o. 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 Since last visit her joint pain is doing well without prolonged morning stiffness. Left shoulder pain is partially better, but has some increased wrist pain near the base of the thumbs. Frequently worsened when lifting her child. No interval infections or antibiotics. Skin rashes are ongoing on her arms, tried topical steroid that was prescribed did not help much. Started using an OTC eczema cream that is helping partially.   Previous HPI 12/27/22 Karina Murphy is a 43 y.o. female here for follow up for seropositive RA after starting Cimzia  currently finishing loading dose before transition to 400 mg subcu every 4 weeks.  Since her last visit she also discontinued the sulfasalazine  due to not seeing clinical benefit from this.  So far symptoms are largely improved since starting the Cimzia  with decreased joint pain and swelling.  Still has some pain and has morning stiffness every day but with decreased severity.  Has not noticed any major flareup or exacerbation from stopping the sulfasalazine .  She had an episode of viral gastroenteritis in March that resolved without major complication but has had some ongoing lower abdominal pain afterwards.  Still having skin rashes most often around her hand and wrist or foot.  Also with some eye  itching and redness consistent with seasonal allergy symptoms.   Previous HPI 09/27/22 Karina Murphy is a 43 y.o. 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 She delivered her baby last month uneventfully. For about 3 weeks notices new skin rashes on her right forearm and on both legs above the knees. This is itchy and bothers her most at night time.    Previous HPI 07/30/22 Sanoe Hazan is a 43 y.o. female here for seropositive RA. She was previously seeing rheumatology after onset of symptoms around 2014 with hand and foot inflammation initially.  She is also experienced involvement of bilateral shoulder pain and stiffness that started later.  She was on treatment but stopped following up after her rheumatologist left that practice in 2018.  Previous treatments including methotrexate and sulfasalazine  and prednisone .  She has pretty much been off any maintenance medication since that time treatments have often consisted of intramuscular steroid injections as needed for disease flareups.  She is getting evaluation  now largely due to increased healthcare system exposure with her pregnancy.  Her arthritis symptoms have overall been doing better during this pregnancy compared to before.  She is not taking any medications regularly for pain.  Worst affected area at the moment is pain and swelling in her right foot.   This encounter was conducted with assistance of in person Spanish language interpreter.   Review of Systems  Constitutional:  Positive for fatigue.  HENT:  Positive for mouth sores. Negative for mouth dryness.   Eyes:  Positive for dryness.  Respiratory:  Negative for shortness of breath.   Cardiovascular:  Negative for chest pain and palpitations.  Gastrointestinal:  Positive for constipation and diarrhea. Negative for blood in stool.  Endocrine: Negative for increased urination.  Genitourinary:  Negative for involuntary  urination.  Musculoskeletal:  Positive for joint pain, joint pain, joint swelling, myalgias, muscle weakness, muscle tenderness and myalgias. Negative for gait problem and morning stiffness.  Skin:  Positive for color change and hair loss. Negative for rash and sensitivity to sunlight.  Allergic/Immunologic: Positive for susceptible to infections.  Neurological:  Positive for dizziness. Negative for headaches.  Hematological:  Positive for swollen glands.  Psychiatric/Behavioral:  Positive for sleep disturbance. Negative for depressed mood. The patient is nervous/anxious.     PMFS History:  Patient Active Problem List   Diagnosis Date Noted   Sinusitis, acute 02/09/2024   Hypokalemia 02/09/2024   Acid reflux disease 02/09/2024   Radial styloid tenosynovitis (de quervain) 03/28/2023   Allergic conjunctivitis 12/27/2022   High risk medication use 07/30/2022   Language barrier 03/31/2022   AMA (advanced maternal age) multigravida 35+ 02/04/2022   Rash and other nonspecific skin eruption 08/31/2016   Seropositive rheumatoid arthritis (HCC) 02/12/2014   Obese 12/26/2013    Past Medical History:  Diagnosis Date   Anxiety    Arthritis, rheumatoid (HCC)    Breast pain, left 09/17/2016   Chronic headaches    GERD (gastroesophageal reflux disease)    Obesity    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    Immunization History  Administered Date(s)  Administered   Influenza,inj,Quad PF,6+ Mos 06/28/2014, 09/17/2016, 06/14/2022   Tdap 06/14/2022     Objective: Vital Signs: BP 106/74 (BP Location: Left Arm, Patient Position: Sitting, Cuff Size: Normal)   Pulse 79   Resp 16   Ht 5' 3 (1.6 m)   Wt 194 lb 9.6 oz (88.3 kg)   LMP 03/18/2024 (Exact Date)   BMI 34.47 kg/m    Physical Exam Eyes:     Conjunctiva/sclera: Conjunctivae normal.  Cardiovascular:     Rate and Rhythm: Normal rate and regular rhythm.  Pulmonary:     Effort: Pulmonary effort is normal.  Breath sounds: Normal breath sounds.  Musculoskeletal:     Right lower leg: No edema.     Left lower leg: No edema.  Skin:    General: Skin is warm and dry.     Findings: No rash.  Neurological:     Mental Status: She is alert.  Psychiatric:        Mood and Affect: Mood normal.      Musculoskeletal Exam:  Shoulders full ROM, left shoulder tenderness with pressure and overhead movement Elbows full ROM no tenderness or swelling Wrists full ROM no tenderness or swelling Fingers with partially reducible swan neck deformities, full reducible lateral deviation Knees full ROM, tenderness to pressure without palpable effusion Ankles full ROM no tenderness or swelling  Investigation: No additional findings.  Imaging: No results found.  Recent Labs: Lab Results  Component Value Date   WBC 3.8 03/19/2024   HGB 11.7 03/19/2024   PLT 293 03/19/2024   NA 139 03/29/2024   K 4.2 03/29/2024   CL 104 03/29/2024   CO2 25 03/29/2024   GLUCOSE 99 03/29/2024   BUN 13 03/29/2024   CREATININE 0.91 03/29/2024   BILITOT 0.9 03/29/2024   ALKPHOS 62 03/29/2024   AST 14 (L) 03/29/2024   ALT 11 03/29/2024   PROT 8.1 03/29/2024   ALBUMIN 3.6 03/29/2024   CALCIUM  9.4 03/29/2024   GFRAA 129 03/19/2020   QFTBGOLDPLUS Negative 09/29/2023    Speciality Comments: Cimzia  started 11/01/2022  Procedures:  No procedures performed Allergies: Bee pollen, Tylenol   [acetaminophen ], Morphine , and Pineapple   Assessment / Plan:     Visit Diagnoses: Seropositive rheumatoid arthritis (HCC) - Continue Advil  as needed for pain management.  Unfortunately still having neutropenia and increased infections despite the lower frequency of Cimzia  injections.  I think she be a good candidate for changing medications discussed to switch to Rinvoq which I think would be a good option.  Does not have any existing cardiovascular history or known contraindications.  For current disease activity and anticipating a switch in treatment we will also resume on daily low-dose steroids and short-term. - Discontinue Cimzia  - Start Rinvoq 15 mg PO daily - Resume prednisone  10 mg daily temporarily.  High risk medication use - Cimzia  400 mg [place] monthly Low neutrophil count likely due to Cimzia , increasing infection risk and she has experienced an increased number of these.  Unfortunately despite decreasing her dosing of Cimzia .  Our selection was initially limited due to breast-feeding concerns but she is no longer and does not have additional plans for more pregnancies so would be candidate for broader selection of treatment options.  Discussed risk of Rinvoq including infections, cytopenias, hepatotoxicity, hyperlipidemia, blood clots and major cardiovascular events. - Recent complete blood count reviewed with neutropenia at 1.0. - Checking CMP and lipid panel baseline for medication monitoring anticipating Rinvoq start   Orders: No orders of the defined types were placed in this encounter.  Meds ordered this encounter  Medications   predniSONE  (DELTASONE ) 10 MG tablet    Sig: Take 1 tablet (10 mg total) by mouth daily with breakfast.    Dispense:  30 tablet    Refill:  2     Follow-Up Instructions: Return in about 2 months (around 05/30/2024) for RA UPA switch f/u 2mos.   Lonni LELON Ester, MD  Note - This record has been created using [employer].  Chart creation  errors have been sought, but may not always  have been located. Such creation errors do  not reflect on  the standard of medical care.

## 2024-03-19 ENCOUNTER — Encounter: Payer: Self-pay | Admitting: Nurse Practitioner

## 2024-03-19 ENCOUNTER — Ambulatory Visit: Payer: Self-pay | Attending: Family Medicine | Admitting: Nurse Practitioner

## 2024-03-19 ENCOUNTER — Encounter (INDEPENDENT_AMBULATORY_CARE_PROVIDER_SITE_OTHER): Payer: Self-pay

## 2024-03-19 ENCOUNTER — Other Ambulatory Visit: Payer: Self-pay

## 2024-03-19 VITALS — BP 110/74 | HR 66 | Resp 18 | Ht 63.0 in | Wt 195.0 lb

## 2024-03-19 DIAGNOSIS — D709 Neutropenia, unspecified: Secondary | ICD-10-CM

## 2024-03-19 DIAGNOSIS — R7303 Prediabetes: Secondary | ICD-10-CM

## 2024-03-19 DIAGNOSIS — K146 Glossodynia: Secondary | ICD-10-CM

## 2024-03-19 LAB — POCT GLYCOSYLATED HEMOGLOBIN (HGB A1C): HbA1c POC (<> result, manual entry): 5.1 % (ref 4.0–5.6)

## 2024-03-19 MED ORDER — LIDOCAINE VISCOUS HCL 2 % MT SOLN
15.0000 mL | OROMUCOSAL | 3 refills | Status: DC | PRN
  Filled 2024-03-19: qty 100, 2d supply, fill #0

## 2024-03-19 NOTE — Progress Notes (Signed)
 Assessment & Plan:  Karina Murphy was seen today for medical management of chronic issues.  Diagnoses and all orders for this visit:  Prediabetes -     POCT glycosylated hemoglobin (Hb A1C)  Painful tongue -     lidocaine  (XYLOCAINE ) 2 % solution; Use as directed 15 mLs in the mouth or throat every 4 (four) hours as needed for mouth pain. -     Ambulatory referral to ENT -     CBC with Differential  Neutropenia, unspecified type (HCC) -     CBC with Differential    Patient has been counseled on age-appropriate routine health concerns for screening and prevention. These are reviewed and up-to-date. Referrals have been placed accordingly. Immunizations are up-to-date or declined.    Subjective:   Chief Complaint  Patient presents with  . Medical Management of Chronic Issues    Karina Murphy 43 y.o. female presents to office today with complaints of painful tongue   VRI was used to communicate directly with patient for the entire encounter including providing detailed patient instructions.    She was seen at the urgent care on 01-23-2024 for pansinusitis, 02-26-2024 for viral pharyngitis and 02-28-2024 for sinusitis. She was treated with augmentin  on 01-23-2024 and 02-28-2024 for presumed bacterial infections. On 02-28-2024 her strep test was negative.  Today she is here with concerns of vocal hoarseness and a bump inside the tip of her tongue that is painful to touch. The bump has been present for almost a week. She reports fevers with Tmax 100 on Thursday and Friday last week (3-4 days ago).  She has been taking advil  with last dose taken 2 days ago.  On exam when the tip of the left side of the tongue is palpated there is a firm nodule present. The nodule however is not visible on the surface of the tongue. There is no oropharyngeal erythema present.  Temp today on exam 98.4.    Review of Systems  Constitutional:  Negative for fever, malaise/fatigue and weight loss.  HENT:  Negative.  Negative for nosebleeds.   Eyes: Negative.  Negative for blurred vision, double vision and photophobia.  Respiratory: Negative.  Negative for cough and shortness of breath.   Cardiovascular: Negative.  Negative for chest pain, palpitations and leg swelling.  Gastrointestinal: Negative.  Negative for heartburn, nausea and vomiting.  Musculoskeletal: Negative.  Negative for myalgias.  Neurological: Negative.  Negative for dizziness, focal weakness, seizures and headaches.  Psychiatric/Behavioral: Negative.  Negative for suicidal ideas.     Past Medical History:  Diagnosis Date  . Anxiety   . Arthritis, rheumatoid (HCC)   . Breast pain, left 09/17/2016  . Chronic headaches   . GERD (gastroesophageal reflux disease)   . Obesity   . 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  . Urticaria   . Weakness 11/23/2016    Past Surgical History:  Procedure Laterality Date  . CHOLECYSTECTOMY  07/2020    Family History  Problem Relation Age of Onset  . Diabetes Mother   . Hypertension Mother   . Diabetes Brother   . Stomach cancer Neg Hx   . Colon cancer Neg Hx   . Esophageal cancer Neg Hx   . Pancreatic cancer Neg Hx   . Asthma Neg Hx   . Cancer Neg Hx   . Heart disease Neg Hx   . Stroke Neg Hx   . Colon polyps Neg Hx   . Rectal cancer Neg Hx     Social History  Reviewed with no changes to be made today.   Outpatient Medications Prior to Visit  Medication Sig Dispense Refill  . cetirizine  (ZYRTEC  ALLERGY) 10 MG tablet Take 1 tablet (10 mg total) by mouth daily. 30 tablet 0  . CIMZIA , 2 SYRINGE, 200 MG/ML prefilled syringe Inject 2 syringes (400mg ) under skin every four weeks as directed. Refrigerate. Protect from light. 3 each 0  . pantoprazole  (PROTONIX ) 40 MG tablet Take 1 tablet (40 mg total) by mouth 2 (two) times daily before a meal. For heartburn or stomach pain 180 tablet 1  . amoxicillin  (AMOXIL ) 500 MG capsule Take 1 capsule (500 mg total) by mouth every 8  (eight) hours until finish all. 21 capsule 1  . benzonatate  (TESSALON  PERLES) 100 MG capsule Take 1 capsule (100 mg total) by mouth every 6 (six) hours as needed for cough. (Patient not taking: Reported on 03/19/2024) 30 capsule 0  . amoxicillin -clavulanate (AUGMENTIN ) 875-125 MG tablet Take 1 tablet by mouth 2 (two) times daily. (Patient not taking: Reported on 03/19/2024) 20 tablet 0  . lidocaine  (XYLOCAINE ) 2 % solution Use as directed 15 mLs in the mouth or throat every 6 (six) hours as needed for mouth pain. (Patient not taking: Reported on 03/19/2024) 100 mL 0   Facility-Administered Medications Prior to Visit  Medication Dose Route Frequency Provider Last Rate Last Admin  . 0.9 %  sodium chloride  infusion  500 mL Intravenous Once Nandigam, Kavitha V, MD        Allergies  Allergen Reactions  . Bee Pollen Swelling    Itchy throat, itchy eyes  . Tylenol  [Acetaminophen ] Other (See Comments)    Tylenol  EXTRA STRENGTH only per pt- TACHYCARDIA per pt  . Morphine  Palpitations  . Pineapple Itching       Objective:    BP 110/74 (BP Location: Left Arm, Patient Position: Sitting, Cuff Size: Normal)   Pulse 66   Resp 18   Ht 5' 3 (1.6 m)   Wt 195 lb (88.5 kg)   LMP 03/18/2024 (Exact Date)   SpO2 99%   Breastfeeding No   BMI 34.54 kg/m  Wt Readings from Last 3 Encounters:  03/19/24 195 lb (88.5 kg)  02/09/24 196 lb 6.4 oz (89.1 kg)  12/28/23 196 lb (88.9 kg)    Physical Exam Vitals and nursing note reviewed.  Constitutional:      Appearance: She is well-developed.  HENT:     Head: Normocephalic and atraumatic.     Mouth/Throat:     Lips: Pink.     Mouth: Mucous membranes are moist. No oral lesions.     Dentition: No gingival swelling or dental abscesses.     Tongue: Lesions present. Tongue does not deviate from midline.     Palate: No mass.     Pharynx: Oropharynx is clear. Uvula midline. No pharyngeal swelling or posterior oropharyngeal erythema.     Tonsils: No tonsillar  exudate.   Cardiovascular:     Rate and Rhythm: Normal rate and regular rhythm.     Heart sounds: Normal heart sounds. No murmur heard.    No friction rub. No gallop.  Pulmonary:     Effort: Pulmonary effort is normal. No tachypnea or respiratory distress.     Breath sounds: Normal breath sounds. No decreased breath sounds, wheezing, rhonchi or rales.  Chest:     Chest wall: No tenderness.  Abdominal:     General: Bowel sounds are normal.     Palpations: Abdomen is soft.  Musculoskeletal:  General: Normal range of motion.     Cervical back: Normal range of motion.  Skin:    General: Skin is warm and dry.  Neurological:     Mental Status: She is alert and oriented to person, place, and time.     Coordination: Coordination normal.  Psychiatric:        Behavior: Behavior normal. Behavior is cooperative.        Thought Content: Thought content normal.        Judgment: Judgment normal.          Patient has been counseled extensively about nutrition and exercise as well as the importance of adherence with medications and regular follow-up. The patient was given clear instructions to go to ER or return to medical center if symptoms don't improve, worsen or new problems develop. The patient verbalized understanding.   Follow-up: Return in about 3 months (around 06/19/2024).   Haze LELON Servant, FNP-BC [facility] [place], [place] [phone]   03/19/2024, 12:53 PM

## 2024-03-19 NOTE — Patient Instructions (Signed)
 BIOTIN 10,000-30,000 for hair, skin, nails

## 2024-03-20 LAB — CBC WITH DIFFERENTIAL/PLATELET
Basophils Absolute: 0 x10E3/uL (ref 0.0–0.2)
Basos: 1 %
EOS (ABSOLUTE): 0.3 x10E3/uL (ref 0.0–0.4)
Eos: 8 %
Hematocrit: 37.6 % (ref 34.0–46.6)
Hemoglobin: 11.7 g/dL (ref 11.1–15.9)
Immature Grans (Abs): 0 x10E3/uL (ref 0.0–0.1)
Immature Granulocytes: 0 %
Lymphocytes Absolute: 1.9 x10E3/uL (ref 0.7–3.1)
Lymphs: 49 %
MCH: 25.9 pg — ABNORMAL LOW (ref 26.6–33.0)
MCHC: 31.1 g/dL — ABNORMAL LOW (ref 31.5–35.7)
MCV: 83 fL (ref 79–97)
Monocytes Absolute: 0.6 x10E3/uL (ref 0.1–0.9)
Monocytes: 15 %
Neutrophils Absolute: 1 x10E3/uL — ABNORMAL LOW (ref 1.4–7.0)
Neutrophils: 27 %
Platelets: 293 x10E3/uL (ref 150–450)
RBC: 4.51 x10E6/uL (ref 3.77–5.28)
RDW: 14.6 % (ref 11.7–15.4)
WBC: 3.8 x10E3/uL (ref 3.4–10.8)

## 2024-03-21 ENCOUNTER — Ambulatory Visit: Payer: Self-pay | Admitting: Nurse Practitioner

## 2024-03-28 ENCOUNTER — Other Ambulatory Visit: Payer: Self-pay

## 2024-03-29 ENCOUNTER — Encounter: Payer: Self-pay | Admitting: Internal Medicine

## 2024-03-29 ENCOUNTER — Ambulatory Visit: Payer: Self-pay | Attending: Internal Medicine | Admitting: Internal Medicine

## 2024-03-29 ENCOUNTER — Other Ambulatory Visit: Payer: Self-pay

## 2024-03-29 ENCOUNTER — Other Ambulatory Visit (HOSPITAL_COMMUNITY)
Admission: AD | Admit: 2024-03-29 | Discharge: 2024-03-29 | Disposition: A | Discharge status: Home / Self Care | Payer: Self-pay | Source: Ambulatory Visit | Attending: Internal Medicine | Admitting: Internal Medicine

## 2024-03-29 VITALS — BP 106/74 | HR 79 | Resp 16 | Ht 63.0 in | Wt 194.6 lb

## 2024-03-29 DIAGNOSIS — M059 Rheumatoid arthritis with rheumatoid factor, unspecified: Secondary | ICD-10-CM

## 2024-03-29 DIAGNOSIS — Z79899 Other long term (current) drug therapy: Secondary | ICD-10-CM

## 2024-03-29 LAB — COMPREHENSIVE METABOLIC PANEL WITH GFR
ALT: 11 U/L (ref 0–44)
AST: 14 U/L — ABNORMAL LOW (ref 15–41)
Albumin: 3.6 g/dL (ref 3.5–5.0)
Alkaline Phosphatase: 62 U/L (ref 38–126)
Anion gap: 10 (ref 5–15)
BUN: 13 mg/dL (ref 6–20)
CO2: 25 mmol/L (ref 22–32)
Calcium: 9.4 mg/dL (ref 8.9–10.3)
Chloride: 104 mmol/L (ref 98–111)
Creatinine, Ser: 0.91 mg/dL (ref 0.44–1.00)
GFR, Estimated: >60 mL/min (ref >60)
Glucose, Bld: 99 mg/dL (ref 70–99)
Potassium: 4.2 mmol/L (ref 3.5–5.1)
Sodium: 139 mmol/L (ref 135–145)
Total Bilirubin: 0.9 mg/dL (ref 0.0–1.2)
Total Protein: 8.1 g/dL (ref 6.5–8.1)

## 2024-03-29 LAB — LIPID PANEL
Cholesterol: 156 mg/dL (ref 0–200)
HDL: 44 mg/dL (ref >40)
LDL Cholesterol: 85 mg/dL (ref 0–99)
Total CHOL/HDL Ratio: 3.5 ratio
Triglycerides: 133 mg/dL (ref <150)
VLDL: 27 mg/dL (ref 0–40)

## 2024-03-29 LAB — SEDIMENTATION RATE: Sed Rate: 26 mm/h — ABNORMAL HIGH (ref 0–22)

## 2024-03-29 MED ORDER — PREDNISONE 10 MG PO TABS
10.0000 mg | ORAL_TABLET | Freq: Every day | ORAL | 2 refills | Status: DC
  Filled 2024-03-29: qty 30, 30d supply, fill #0

## 2024-03-29 NOTE — Patient Instructions (Signed)
 Upadacitinib Extended-Release Tablets Qu es este medicamento? El UPADACITINIB trata afecciones autoinmunes, tales como artritis, eccema y colitis ulcerativa. Frecuentemente se usa  cuando otros medicamentos no han funcionado bien o no se Secondary school teacher. Acta desacelerando un sistema inmunolgico hiperactivo. Esto disminuye la inflamacin. Este medicamento puede ser utilizado para otros usos; si tiene alguna pregunta consulte con su proveedor de atencin mdica o con su farmacutico. MARCAS COMUNES: RINVOQ Qu le debo informar a mi profesional de la salud antes de tomar este medicamento? Necesitan saber si usted presenta alguno de los siguientes problemas o situaciones: Cncer Uso de tabaco actual o en el pasado Diabetes Ha tenido un ataque cardiaco o accidente cerebrovascular Ha tenido cogulos sanguneos Ha estado en contacto cercano con alguien que tenga tuberculosis (TB) Enfermedad cardiaca Presin arterial alta Nivel de colesterol alto VIH o SIDA Tiene una infeccin o ha tenido una infeccin que no desaparece, tales como tuberculosis (TB), culebrilla, hepatitis, herpes Enfermedad renal Vive o ha viajado al suroeste de los [place] U[place]. o a los valles de los ros [place]  o [place]  Enfermedad heptica Niveles bajos de clulas sanguneas (glbulos blancos, glbulos rojos y plaquetas) Enfermedades pulmonares o respiratorias, tales como asma o EPOC Vacuna reciente o programada Problemas estomacales o intestinales [place]  AINE, medicamentos para el dolor y la inflamacin, tales como ibuprofeno o naproxeno [place]  medicamentos esteroideos, tales como prednisona o cortisona Sistema inmunolgico debilitado Una reaccin alrgica o inusual al upadacitinib, a otros medicamentos, alimentos, colorantes o conservantes Si est en embarazo o buscando quedar en embarazo Si est amamantando a un beb Cmo debo utilizar este medicamento? Tome este medicamento por va oral con agua. Use el medicamento segn  las instrucciones en la etiqueta a la misma hora todos [place]. No corte, triture ni [employer]. Trague las tabletas enteras. Puede tomarlo con o sin alimentos. Si el Social worker, tmelo con alimentos. Siga usndolo a menos que su equipo de atencin le indique dejar de Media planner. No tome este medicamento con jugo de toronja (pomelo). Su farmacutico le dar una Gua del medicamento especial (MedGuide, nombre en ingls) con cada receta y en cada ocasin que la vuelva a surtir. Asegrese de leer esta informacin cada vez cuidadosamente. Hable con su equipo de atencin sobre el uso de este medicamento en nios. Aunque se puede recetar a nios tan pequeos como de 2 aos de edad con ciertas afecciones, existen precauciones que deben tomarse. Sobredosis: Pngase en contacto inmediatamente con un centro toxicolgico o una sala de urgencia si usted cree que haya tomado demasiado medicamento.<br>ATENCIN: [employer] es solo para usted. No comparta este medicamento con nadie. Qu sucede si me olvido de una dosis? Si olvida una dosis, adminstrela lo antes posible. Si es casi la hora de la prxima dosis, administre solo esa dosis. No se administre dosis adicionales o dobles. Qu puede interactuar con este medicamento? Ciertos medicamentos antivirales para VIH o hepatitis Ciertos medicamentos para las infecciones micticas, tales como ketoconazol, itraconazol, posaconazol, voriconazol Ciertos medicamentos para convulsiones, tales como carbamazepina, fenobarbital, fenitona Claritromicina Toronja (pomelo) y slovenia de toronja Vacunas de virus vivos Medicamentos que reducen la probabilidad de combatir una infeccin, tales como abatacept, adalimumab, azatioprina, ciclosporina, etanercept, infliximab, rituximab Rifampicina Suplementos, como hierba de [place] medicamentos podran afectar la forma en que funciona este medicamento. Hable con su equipo de  atencin [employer] medicamentos que usa . Es posible que le sugieran cambios en su plan de tratamiento para reducir el riesgo de efectos secundarios y asegurarse de  que los medicamentos actan del modo previsto. Puede ser que esta lista no menciona todas las posibles interacciones. Informe a su profesional de [employer] de [employer] productos a base de hierbas, medicamentos de [place] o suplementos nutritivos que est tomando. Si usted fuma, consume bebidas alcohlicas o si utiliza drogas ilegales, indqueselo tambin a su profesional de [employer]. Algunas sustancias pueden interactuar con su medicamento. A qu debo estar atento al usar [employer]? Visite a su equipo de atencin para que revise su evolucin peridicamente. Informe a su equipo de atencin si los sntomas no comienzan a mejorar o si empeoran. Usted podra necesitar realizarse anlisis de sangre mientras est usando este medicamento. Este medicamento puede aumentar su riesgo de contraer una infeccin. Llame para pedir consejo a su equipo de atencin si tiene fiebre, escalofros, dolor de garganta o cualquier otro sntoma de resfriado o gripe. No se trate usted mismo. Trate de no acercarse a personas que estn enfermas. Su equipo de Geographical information systems officer una prueba de tuberculosis (TB) antes de comenzar a recibir [employer]. Si creen que usted est en riesgo, es posible que reciba tratamiento con medicamentos para la tuberculosis. Debe comenzar a recibir Research scientist (medical) para la TB antes de Games developer a recibir Industrial/product designer. Asegrese de terminar todo el ciclo del medicamento para la TB. Hable con su equipo de atencin sobre sus antecedentes de vacunacin. Para reducir su riesgo de infeccin, es posible que necesite ciertas vacunas antes de comenzar a recibir [employer]. Hable con su equipo de atencin sobre su riesgo de cncer. Usted podra tener mayor riesgo de desarrollar ciertos tipos de cncer si usa  este  medicamento. El uso de tabaco podra aumentar su riesgo de tener cncer. Hable con su equipo de atencin sobre realizarse una revisin para Market researcher de piel mientras usa  este medicamento. Limite la cantidad de tiempo que pasa al sol. Use ropa protectora y crema de proteccin solar cuando est al sol. No utilice lmparas solares, camas solares ni cabinas solares. Este medicamento podra aumentar el riesgo de cogulos sanguneos, ataque cardiaco, accidente cerebrovascular o muerte. La presin sangunea alta, el colesterol alto, la diabetes, la edad avanzada, el exceso de peso y el uso de tabaco aumentan este riesgo. Llame a los servicios de emergencia de inmediato si tiene sntomas de un ataque cardiaco o accidente cerebrovascular. Este medicamento puede aumentar el colesterol malo y las grasas (tales como los LDL y los triglicridos) y disminuir el colesterol bueno (HDL) en la sangre. Es posible que deba realizarse anlisis de sangre para verificar su colesterol. Pregunte a su equipo de atencin qu puede hacer para disminuir su riesgo de colesterol alto mientras usa  este medicamento. Si observa algn cambio en la vista, infrmelo de inmediato a su equipo de atencin. Informe a su equipo de atencin si frecuentemente ve la tableta en sus heces. Hable con su equipo de atencin si podra estar en embarazo. Este medicamento puede causar defectos congnitos graves si se usa  durante el embarazo y por 4 semana despus de la ltima dosis. Deber realizarse una prueba de embarazo y obtener un resultado negativo antes de Games developer a Producer, television/film/video. Se recomienda utilizar un mtodo anticonceptivo mientras est usando este medicamento y por 4 semanas despus de la ltima dosis. Su equipo de atencin mdica puede ayudarle a Clinical research associate la opcin que mejor se adapte a sus necesidades. No debe amamantar a un beb mientras usa  este medicamento y por 6 das despus de la ltima dosis. Qu efectos  secundarios  puedo tener al [employer] medicamento? Efectos secundarios que debe informar a su equipo de atencin tan pronto como sea posible: Reacciones alrgicas: erupcin cutnea, comezn/picazn, urticaria, hinchazn de la cara, los labios, la lengua o la garganta Cogulo sanguneo: Engineer, mining, hinchazn, calor en una pierna, falta de aire, dolor en el pecho Cambio en la visin Ataque cardiaco: dolor u opresin en el pecho, los hombros, los brazos o la [place], nuseas, falta de [place], piel fra o sudorosa, sensacin de desmayo o aturdimiento Infeccin: fiebre, escalofros, tos, dolor de garganta, heridas que no sanan, dolor o problemas para Geographical information systems officer, sensacin general de molestia o malestar Lesin en el hgado: dolor en la regin abdominal superior derecha, prdida de apetito, nuseas, heces de color claro, orina amarilla oscura o marrn, color amarillento de los ojos o la piel, debilidad o fatiga inusuales Recuento bajo de glbulos rojos: debilidad o fatiga inusuales, mareo, dolor de cabeza, dificultad para Dietitian de estmago intenso que no desaparece o empeora Accidente cerebrovascular: entumecimiento o debilidad repentinos de la cara, un brazo o una pierna, dificultad para hablar, confusin, dificultad para caminar, prdida de equilibrio o coordinacin, mareos, dolor de cabeza intenso, cambio en la visin Efectos secundarios que generalmente no requieren atencin mdica (informe a su equipo de atencin si persisten o si son molestos): Acn Tos Dolor de cabeza Nuseas [place] o congestin nasal Puede ser que esta lista no menciona todos los posibles efectos secundarios. Comunquese a su mdico por asesoramiento mdico [employer]. Usted puede informar los efectos secundarios a la FDA por telfono al 1-800-FDA-1088. Dnde debo guardar mi medicina? Mantenga fuera del alcance de nios y Neurosurgeon. Guarde a temperatura ambiente, entre 20 y 25 grados Celsius (68 y 36 grados Fahrenheit).  Proteja de la humedad. Mantenga el recipiente bien cerrado. Mantenga este medicamento en el recipiente original hasta que est listo para usarlo. Deseche todo el medicamento que no haya utilizado despus de la fecha de vencimiento. Para desechar los medicamentos que ya no necesite o que estn vencidos: Lleve el medicamento a un programa de recuperacin de medicamentos. Consulte con su farmacia o con una entidad reguladora para encontrar un lugar donde llevarlo. Si no puede devolver el medicamento, consulte la etiqueta o el folleto de informacin para ver si debe desecharlo en la basura o arrojarlo por el sanitario. Si no est seguro, pregunte a su equipo de atencin. Si es seguro colocarlo en la basura, saque el medicamento del recipiente. Mezcle el medicamento con piedras sanitarias para gatos, tierra, posos (residuos) de caf u otro desperdicio. Coloque la mezcla en una bolsa o recipiente que quede [place]. Deseche en la basura. ATENCIN: Este folleto es un resumen. Puede ser que no cubra toda la posible informacin. Si usted tiene preguntas acerca de esta medicina, consulte con su mdico, su farmacutico o su profesional de Radiographer, therapeutic.  2024 Elsevier/Gold Standard (2023-06-27 00:00:00)

## 2024-03-29 NOTE — Progress Notes (Addendum)
 Pharmacy Note  Subjective: Patient presents today to [facility] for follow up office visit. Patient seen by the pharmacist for counseling on Rinvoq for rheumatoid arthritis..  Previous therapy include: Cimzia  (neutropenia). Previous treatments including methotrexate and sulfasalazine  and prednisone .   History of diverticulitis:  No  History of MI, stroke, or CV events:  No  Objective:  CMP     Component Value Date/Time   NA 138 02/09/2024 1533   K 4.1 02/09/2024 1533   CL 102 02/09/2024 1533   CO2 20 02/09/2024 1533   GLUCOSE 96 02/09/2024 1533   GLUCOSE 94 01/23/2024 1832   BUN 14 02/09/2024 1533   CREATININE 0.81 02/09/2024 1533   CREATININE 0.81 11/19/2016 1648   CALCIUM  9.2 02/09/2024 1533   PROT 7.6 12/28/2023 1213   PROT 7.6 06/17/2023 1430   ALBUMIN 3.7 12/28/2023 1213   ALBUMIN 4.3 06/17/2023 1430   AST 16 12/28/2023 1213   ALT 13 12/28/2023 1213   ALKPHOS 46 12/28/2023 1213    CBC    Component Value Date/Time   WBC 3.8 03/19/2024 0945   WBC 3.8 (L) 01/23/2024 1832   RBC 4.51 03/19/2024 0945   RBC 4.93 01/23/2024 1832   HGB 11.7 03/19/2024 0945   HCT 37.6 03/19/2024 0945   PLT 293 03/19/2024 0945   MCV 83 03/19/2024 0945   MCH 25.9 (L) 03/19/2024 0945   MCH 25.8 (L) 01/23/2024 1832   MCHC 31.1 (L) 03/19/2024 0945   MCHC 31.8 01/23/2024 1832   RDW 14.6 03/19/2024 0945    Baseline Immunosuppressant Therapy Labs TB GOLD    Latest Ref Rng & Units 09/29/2023   12:15 PM  Quantiferon TB Gold  Quantiferon TB Gold Plus Negative Negative    Hepatitis Panel    Latest Ref Rng & Units 09/29/2022    2:16 PM  Hepatitis  Hep B IgM Negative Negative    HIV Lab Results  Component Value Date   HIV Non Reactive 06/14/2022   HIV Non Reactive 03/08/2022   HIV NONREACTIVE 07/26/2016   Immunoglobulins   SPEP    Latest Ref Rng & Units 12/28/2023   12:13 PM  Serum Protein Electrophoresis  Total Protein 6.5 - 8.1 g/dL 7.6    H3EI No results  found for: G6PDH TPMT No results found for: TPMT   Lipid Panel Lab Results  Component Value Date   CHOL 162 08/26/2023   HDL 46 08/26/2023   LDLCALC 100 (H) 08/26/2023   TRIG 87 08/26/2023   CHOLHDL 3.5 08/26/2023     Assessment/Plan:  Counseled patient that Rinvoq is a JAK inhibitor indicated for Rheumatoid Arthritis.  Counseled patient on purpose, proper use, and adverse effects of Rinvoq.    Reviewed the most common adverse effects including infection, diarrhea, headaches.  Also reviewed rare adverse effects such as bowel injury and the need to contact us  if they develop stomach pain during treatment. Counseled on the increase risk of venous thrombosis. Counseled about FDA black box warning of MACE (major adverse CV events including cardiovascular death, myocardial infarction, and stroke).  Reviewed with patient that there is the possibility of an increased risk of malignancy specifically lung cancer and lymphomas but it is not well understood if this increased risk is due to the medication or the disease state. Instructed patient that medication should be held for infection and prior to surgery.  Advised patient to avoid live vaccines. Recommend annual influenza, PCV 15 or PCV20 or Pneumovax 23, and Shingrix as indicated.  Reviewed importance of routine lab monitoring including lipid panel.  Will recheck lipid panel 3 months after starting and annually thereafter. CBC and CMP will be monitored routinely every 3 months. Standing orders placed. Provided patient with medication education material and answered all questions.  Patient consented to Rinvoq.  Will upload into patient's chart.  Will apply through patient's insurance and update when we receive a response.    Patient dose will be 15 mg daily.  Patient uninsured. PAP through [employer] in process  Her last Cimzia  injection was on 03/01/2024  Patient was also provided with Shingles vaccine patient assistance application for Shingrix  through [employer]  Sherry Pennant, PharmD, MPH, BCPS, CPP Clinical Pharmacist (Rheumatology and Pulmonology)

## 2024-03-30 ENCOUNTER — Telehealth: Payer: Self-pay

## 2024-03-30 ENCOUNTER — Ambulatory Visit: Payer: Self-pay | Admitting: Nurse Practitioner

## 2024-03-30 NOTE — Telephone Encounter (Addendum)
 Submitted Patient Assistance Application to [employer] along with provider portion, patient portion, medication list. Will update patient when we receive a response.  Phone: [phone] Fax: [phone]  ----- Message from Sherry GORMAN Pennant sent at 03/29/2024 12:06 PM EDT ----- Patient will be Rinvoq new start. PAP application is pending

## 2024-04-02 ENCOUNTER — Other Ambulatory Visit: Payer: Self-pay

## 2024-04-10 NOTE — Telephone Encounter (Signed)
 Patient no longer wants to switch medications per MyChart message. Would like to continue Cimzia  for another 3 months before making switch  Traniece Boffa, PharmD, MPH, BCPS, CPP Clinical Pharmacist (Rheumatology and Pulmonology)

## 2024-04-10 NOTE — Telephone Encounter (Signed)
 We can hold off for now on switching to rinvoq at patient preference.

## 2024-05-17 NOTE — Progress Notes (Deleted)
 Office Visit Note  Patient: Karina Murphy             Date of Birth: 26-Apr-1981           MRN: [id number]             PCP: Theotis Haze ORN, NP Referring: Theotis Haze ORN, NP Visit Date: 05/31/2024   Subjective:  No chief complaint on file.   History of Present Illness: Karina Murphy is a 43 y.o. 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     Previous HPI 03/29/2024 Karina Murphy is a 43 y.o. 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     She has experienced increased joint pain and swelling in her hands, which she attributes to a decrease in her medication.  We previously decreased her dosing due to cytopenias and increased frequency of infections.  She was scheduled for an injection on July 4th but postponed it due to recurrent infections, having had three infections in the past month. She is uncertain about the appropriate waiting period after completing antibiotics before resuming her injections.   She is currently managing her joint pain with Advil , taking two tablets of 220 mg Liquigel, which she finds effective.  She does see some increased swelling in multiple joints.  She also experienced knee pain last week.       Previous HPI 12/28/2023 Karina Murphy is a 43 y.o. 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     We decreased the medication dose by half after last visit due to persistent leukopenia and neutropenia. She experiences swelling in her wrists and hands. The symptoms tend to increase about two weeks after her monthly Cimzia  injection. She last received an injection on December 16, 2023, and noted hand pain two days ago, which was relieved by taking two Advil .   She also mentions occasional knee pain and shoulder discomfort, particularly when her baby rests on her arm, causing soreness. The shoulder pain sometimes  occurs at night, especially after carrying her baby.   Approximately two weeks ago, she experienced a gastrointestinal illness characterized by vomiting and diarrhea, which affected everyone in her household. She did not eat for three days during this period, suspecting a viral cause.   She manages acute pain episodes with Advil  as needed. She uses Nexplanon for birth control and is not planning to have more children.    Encounter conducted with assistance of in person spanish language interpreted [place].   Previous HPI 09/29/2023 Karina Murphy is a 43 y.o. 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 She reports a delay in receiving her arthritis medication in December, which resulted in a flare-up of symptoms, including swelling and discomfort in the hands and wrists. The patient is now back on her medication and the symptoms have improved.   The patient also experienced a sore throat and fever one week ago, which resolved with home remedies. She denies any need for antibiotics. The patient's daughter also had a sore throat around the same time.   In addition, the patient has been experiencing dryness in the mouth and throat for the past two months. She denies having a humidifier at home and has not been using any specific treatments for this symptom.   The patient's recent blood tests showed a low white blood cell  count, specifically neutrophils, which was at 600 at one point. The patient was informed that this could be due to her arthritis medication and could increase her risk of bacterial infections.   The patient denies any other swelling outside of the hands and wrists. She has been using an ointment for the discomfort in her hands.         Previous HPI 06/28/2023 Karina Murphy is a 43 y.o. 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  Still has some persistent joint pain and stiffness most  commonly MCP and PIP joints.  Worse on her left thumb recently sometimes with associated swelling or sometimes without change in appearance.  She had recent episode where her usual health was not available and had to self administer her Cimzia  had some small raised bump and discoloration around the injection site for a few days.  Recent labs from October 4 with leukopenia to 3.2 with 1000 neutrophils otherwise normal.   Previous HPI 03/28/2023 Karina Murphy is a 43 y.o. 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 Since last visit her joint pain is doing well without prolonged morning stiffness. Left shoulder pain is partially better, but has some increased wrist pain near the base of the thumbs. Frequently worsened when lifting her child. No interval infections or antibiotics. Skin rashes are ongoing on her arms, tried topical steroid that was prescribed did not help much. Started using an OTC eczema cream that is helping partially.   Previous HPI 12/27/22 Karina Murphy is a 43 y.o. female here for follow up for seropositive RA after starting Cimzia  currently finishing loading dose before transition to 400 mg subcu every 4 weeks.  Since her last visit she also discontinued the sulfasalazine  due to not seeing clinical benefit from this.  So far symptoms are largely improved since starting the Cimzia  with decreased joint pain and swelling.  Still has some pain and has morning stiffness every day but with decreased severity.  Has not noticed any major flareup or exacerbation from stopping the sulfasalazine .  She had an episode of viral gastroenteritis in March that resolved without major complication but has had some ongoing lower abdominal pain afterwards.  Still having skin rashes most often around her hand and wrist or foot.  Also with some eye itching and redness consistent with seasonal allergy symptoms.   Previous HPI 09/27/22 Karina Murphy  is a 43 y.o. 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 She delivered her baby last month uneventfully. For about 3 weeks notices new skin rashes on her right forearm and on both legs above the knees. This is itchy and bothers her most at night time.    Previous HPI 07/30/22 Karina Murphy is a 43 y.o. female here for seropositive RA. She was previously seeing rheumatology after onset of symptoms around 2014 with hand and foot inflammation initially.  She is also experienced involvement of bilateral shoulder pain and stiffness that started later.  She was on treatment but stopped following up after her rheumatologist left that practice in 2018.  Previous treatments including methotrexate and sulfasalazine  and prednisone .  She has pretty much been off any maintenance medication since that time treatments have often consisted of intramuscular steroid injections as needed for disease flareups.  She is getting evaluation now largely due to increased healthcare system exposure with her pregnancy.  Her arthritis  symptoms have overall been doing better during this pregnancy compared to before.  She is not taking any medications regularly for pain.  Worst affected area at the moment is pain and swelling in her right foot.   This encounter was conducted with assistance of in person Spanish language interpreter.   No Rheumatology ROS completed.   PMFS History:  Patient Active Problem List   Diagnosis Date Noted   Sinusitis, acute 02/09/2024   Hypokalemia 02/09/2024   Acid reflux disease 02/09/2024   Radial styloid tenosynovitis (de quervain) 03/28/2023   Allergic conjunctivitis 12/27/2022   High risk medication use 07/30/2022   Language barrier 03/31/2022   AMA (advanced maternal age) multigravida 35+ 02/04/2022   Rash and other nonspecific skin eruption 08/31/2016   Seropositive rheumatoid arthritis (HCC) 02/12/2014   Obese 12/26/2013    Past  Medical History:  Diagnosis Date   Anxiety    Arthritis, rheumatoid (HCC)    Breast pain, left 09/17/2016   Chronic headaches    GERD (gastroesophageal reflux disease)    Obesity    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    Immunization History  Administered Date(s) Administered   Influenza,inj,Quad PF,6+ Mos 06/28/2014, 09/17/2016, 06/14/2022   Tdap 06/14/2022     Objective: Vital Signs: There were no vitals taken for this visit.   Physical Exam   Musculoskeletal Exam: ***  CDAI Exam: CDAI Score: -- Patient Global: --; Provider Global: -- Swollen: --; Tender: -- Joint Exam 05/31/2024   No joint exam has been documented for this visit   There is currently no information documented on the homunculus. Go to the Rheumatology activity and complete the homunculus joint exam.  Investigation: No additional findings.  Imaging: No results found.  Recent Labs: Lab Results  Component Value Date   WBC 3.8 03/19/2024   HGB 11.7 03/19/2024   PLT 293 03/19/2024   NA 139 03/29/2024   K 4.2 03/29/2024   CL 104 03/29/2024   CO2 25 03/29/2024   GLUCOSE 99 03/29/2024   BUN 13 03/29/2024   CREATININE 0.91 03/29/2024   BILITOT 0.9 03/29/2024   ALKPHOS 62 03/29/2024   AST 14 (L) 03/29/2024   ALT 11 03/29/2024   PROT 8.1 03/29/2024   ALBUMIN 3.6 03/29/2024   CALCIUM  9.4 03/29/2024   GFRAA 129 03/19/2020   QFTBGOLDPLUS Negative 09/29/2023     Speciality Comments: Cimzia  started 11/01/2022  Procedures:  No procedures performed Allergies: Bee pollen, Tylenol  [acetaminophen ], Morphine , and Pineapple   Assessment / Plan:     Visit Diagnoses: No diagnosis found.  ***  Orders: No orders of the defined types were placed in this encounter.  No orders of the defined types were placed in this encounter.    Follow-Up Instructions:  No follow-ups on file.   Tiajah Oyster M Shakyia Bosso, CMA  Note - This record has been created using Animal nutritionist.  Chart creation errors have been sought, but may not always  have been located. Such creation errors do not reflect on  the standard of medical care.

## 2024-05-29 ENCOUNTER — Institutional Professional Consult (permissible substitution) (INDEPENDENT_AMBULATORY_CARE_PROVIDER_SITE_OTHER): Payer: Self-pay | Admitting: Otolaryngology

## 2024-05-31 ENCOUNTER — Ambulatory Visit: Payer: Self-pay | Admitting: Internal Medicine

## 2024-05-31 DIAGNOSIS — M059 Rheumatoid arthritis with rheumatoid factor, unspecified: Secondary | ICD-10-CM

## 2024-05-31 DIAGNOSIS — Z79899 Other long term (current) drug therapy: Secondary | ICD-10-CM

## 2024-06-11 ENCOUNTER — Ambulatory Visit: Payer: Self-pay | Admitting: Nurse Practitioner

## 2024-06-19 ENCOUNTER — Ambulatory Visit: Payer: Self-pay | Attending: Nurse Practitioner | Admitting: Nurse Practitioner

## 2024-06-19 ENCOUNTER — Encounter: Payer: Self-pay | Admitting: Nurse Practitioner

## 2024-06-19 ENCOUNTER — Other Ambulatory Visit: Payer: Self-pay

## 2024-06-19 VITALS — BP 105/70 | HR 65 | Resp 18 | Ht 63.0 in | Wt 197.2 lb

## 2024-06-19 DIAGNOSIS — R1013 Epigastric pain: Secondary | ICD-10-CM

## 2024-06-19 DIAGNOSIS — H04123 Dry eye syndrome of bilateral lacrimal glands: Secondary | ICD-10-CM

## 2024-06-19 DIAGNOSIS — Z23 Encounter for immunization: Secondary | ICD-10-CM

## 2024-06-19 MED ORDER — PANTOPRAZOLE SODIUM 40 MG PO TBEC
40.0000 mg | DELAYED_RELEASE_TABLET | Freq: Two times a day (BID) | ORAL | 1 refills | Status: AC
  Filled 2024-06-19: qty 180, 90d supply, fill #0

## 2024-06-19 MED ORDER — PROPYLENE GLYCOL 0.6 % OP SOLN
1.0000 [drp] | Freq: Three times a day (TID) | OPHTHALMIC | 1 refills | Status: DC
  Filled 2024-06-19: qty 15, fill #0

## 2024-06-19 NOTE — Progress Notes (Signed)
 Assessment & Plan:  [name] was seen today for medical management of chronic issues.  Diagnoses and all orders for this visit:  Epigastric pain Restart pantoprazole  and take as prescribed -     pantoprazole  (PROTONIX ) 40 MG tablet; Take 1 tablet (40 mg total) by mouth 2 (two) times daily before a meal. For heartburn or stomach pain INSTRUCTIONS: Avoid GERD Triggers: acidic, spicy or fried foods, caffeine, coffee, sodas,  alcohol and chocolate.    Dry eyes -     Propylene Glycol 0.6 % SOLN; Apply 1 drop to eye 3 (three) times daily.    Patient has been counseled on age-appropriate routine health concerns for screening and prevention. These are reviewed and up-to-date. Referrals have been placed accordingly. Immunizations are up-to-date or declined.    Subjective:   Chief Complaint  Patient presents with   Medical Management of Chronic Issues     History of Present Illness Karina Murphy is a 43 year old female who presents with midepigastric abdominal pain and eye dryness.  She has been experiencing severe abdominal pain for the past five days, which worsens after eating and improves with sitting or ceasing movement. She had previously discontinued her acid reflux medication as she was feeling well, but resumed it three days ago due to the return of pain. She takes the medication at night before sleeping however instructions were given for PPI twice a day    She also reports significant eye dryness during the day and waking up with eye discharge that differs from normal morning crusting. These symptoms began approximately two months ago. She was referred to an ophthalmologist about a month and a half to two months ago but was unable to be seen due to insurance issues. She is seeking eye drops to help with the dryness.  VRI was used to communicate directly with patient for the entire encounter including providing detailed patient instructions.      BP Readings from Last 3  Encounters:  06/19/24 105/70  03/29/24 106/74  03/19/24 110/74    Review of Systems  Constitutional:  Negative for fever, malaise/fatigue and weight loss.  HENT: Negative.  Negative for nosebleeds.   Eyes:  Positive for pain and discharge. Negative for blurred vision, double vision and photophobia.  Respiratory: Negative.  Negative for cough and shortness of breath.   Cardiovascular: Negative.  Negative for chest pain, palpitations and leg swelling.  Gastrointestinal:  Positive for abdominal pain. Negative for blood in stool, constipation, diarrhea, heartburn, melena, nausea and vomiting.  Musculoskeletal: Negative.  Negative for myalgias.  Neurological: Negative.  Negative for dizziness, focal weakness, seizures and headaches.  Psychiatric/Behavioral: Negative.  Negative for suicidal ideas.     Past Medical History:  Diagnosis Date   Anxiety    Arthritis, rheumatoid (HCC)    Breast pain, left 09/17/2016   Chronic headaches    GERD (gastroesophageal reflux disease)    Obesity    Osteitis of pelvic region 04/16/2019   Urticaria    Weakness 11/23/2016    Past Surgical History:  Procedure Laterality Date   CHOLECYSTECTOMY  07/2020    Family History  Problem Relation Age of Onset   Diabetes Mother    Hypertension Mother    Diabetes Brother    Stomach cancer Neg Hx    Colon cancer Neg Hx    Esophageal cancer Neg Hx    Pancreatic cancer Neg Hx    Asthma Neg Hx    Cancer Neg Hx    Heart  disease Neg Hx    Stroke Neg Hx    Colon polyps Neg Hx    Rectal cancer Neg Hx     Social History Reviewed with no changes to be made today.   Outpatient Medications Prior to Visit  Medication Sig Dispense Refill   cetirizine  (ZYRTEC  ALLERGY) 10 MG tablet Take 1 tablet (10 mg total) by mouth daily. 30 tablet 0   pantoprazole  (PROTONIX ) 40 MG tablet Take 1 tablet (40 mg total) by mouth 2 (two) times daily before a meal. For heartburn or stomach pain 180 tablet 1   predniSONE   (DELTASONE ) 10 MG tablet Take 1 tablet (10 mg total) by mouth daily with breakfast. (Patient not taking: Reported on 06/19/2024) 30 tablet 2   benzonatate  (TESSALON  PERLES) 100 MG capsule Take 1 capsule (100 mg total) by mouth every 6 (six) hours as needed for cough. (Patient not taking: Reported on 06/19/2024) 30 capsule 0   Facility-Administered Medications Prior to Visit  Medication Dose Route Frequency Provider Last Rate Last Admin   0.9 %  sodium chloride  infusion  500 mL Intravenous Once Nandigam, Kavitha V, MD        Allergies  Allergen Reactions   Bee Pollen Swelling    Itchy throat, itchy eyes   Tylenol  [Acetaminophen ] Other (See Comments)    Tylenol  EXTRA STRENGTH only per pt- TACHYCARDIA per pt   Morphine  Palpitations   Pineapple Itching       Objective:    BP 105/70 (BP Location: Left Arm, Patient Position: Sitting, Cuff Size: Normal)   Pulse 65   Resp 18   Ht 5' 3 (1.6 m)   Wt 197 lb 3.2 oz (89.4 kg)   LMP 05/29/2024 (Approximate)   SpO2 97%   Breastfeeding No   BMI 34.93 kg/m  Wt Readings from Last 3 Encounters:  06/19/24 197 lb 3.2 oz (89.4 kg)  03/29/24 194 lb 9.6 oz (88.3 kg)  03/19/24 195 lb (88.5 kg)    Physical Exam Vitals and nursing note reviewed.  Constitutional:      Appearance: She is well-developed.  HENT:     Head: Normocephalic and atraumatic.  Cardiovascular:     Rate and Rhythm: Normal rate and regular rhythm.     Heart sounds: Normal heart sounds. No murmur heard.    No friction rub. No gallop.  Pulmonary:     Effort: Pulmonary effort is normal. No tachypnea or respiratory distress.     Breath sounds: Normal breath sounds. No decreased breath sounds, wheezing, rhonchi or rales.  Chest:     Chest wall: No tenderness.  Musculoskeletal:        General: Normal range of motion.     Cervical back: Normal range of motion.  Skin:    General: Skin is warm and dry.  Neurological:     Mental Status: She is alert and oriented to person,  place, and time.     Coordination: Coordination normal.  Psychiatric:        Behavior: Behavior normal. Behavior is cooperative.        Thought Content: Thought content normal.        Judgment: Judgment normal.          Patient has been counseled extensively about nutrition and exercise as well as the importance of adherence with medications and regular follow-up. The patient was given clear instructions to go to ER or return to medical center if symptoms don't improve, worsen or new problems develop. The patient verbalized  understanding.   Follow-up: Return in about 6 months (around 12/18/2024).   Haze LELON Servant, FNP-BC [facility] [place], [place] [phone]   06/19/2024, 4:44 PM

## 2024-06-27 ENCOUNTER — Other Ambulatory Visit: Payer: Self-pay

## 2024-06-28 ENCOUNTER — Other Ambulatory Visit: Payer: Self-pay

## 2024-08-21 NOTE — Progress Notes (Deleted)
 Office Visit Note  Patient: Karina Murphy             Date of Birth: 07/27/1981           MRN: [id number]             PCP: Theotis Haze ORN, NP Referring: Theotis Haze ORN, NP Visit Date: 09/04/2024   Subjective:  No chief complaint on file.   History of Present Illness: Karina Murphy is a 43 y.o. 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     Previous HPI 03/29/2024 Karina Murphy is a 43 y.o. 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     She has experienced increased joint pain and swelling in her hands, which she attributes to a decrease in her medication.  We previously decreased her dosing due to cytopenias and increased frequency of infections.  She was scheduled for an injection on July 4th but postponed it due to recurrent infections, having had three infections in the past month. She is uncertain about the appropriate waiting period after completing antibiotics before resuming her injections.   She is currently managing her joint pain with Advil , taking two tablets of 220 mg Liquigel, which she finds effective.  She does see some increased swelling in multiple joints.  She also experienced knee pain last week.       Previous HPI 12/28/2023 Karina Murphy is a 43 y.o. 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     We decreased the medication dose by half after last visit due to persistent leukopenia and neutropenia. She experiences swelling in her wrists and hands. The symptoms tend to increase about two weeks after her monthly Cimzia  injection. She last received an injection on December 16, 2023, and noted hand pain two days ago, which was relieved by taking two Advil .   She also mentions occasional knee pain and shoulder discomfort, particularly when her baby rests on her arm, causing soreness. The shoulder pain sometimes  occurs at night, especially after carrying her baby.   Approximately two weeks ago, she experienced a gastrointestinal illness characterized by vomiting and diarrhea, which affected everyone in her household. She did not eat for three days during this period, suspecting a viral cause.   She manages acute pain episodes with Advil  as needed. She uses Nexplanon for birth control and is not planning to have more children.    Encounter conducted with assistance of in person spanish language interpreted [place]ass[place].   Previous HPI 09/29/2023 Karina Murphy is a 43 y.o. 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 She reports a delay in receiving her arthritis medication in December, which resulted in a flare-up of symptoms, including swelling and discomfort in the hands and wrists. The patient is now back on her medication and the symptoms have improved.   The patient also experienced a sore throat and fever one week ago, which resolved with home remedies. She denies any need for antibiotics. The patient's daughter also had a sore throat around the same time.   In addition, the patient has been experiencing dryness in the mouth and throat for the past two months. She denies having a humidifier at home and has not been using any specific treatments for this symptom.   The patient's recent blood tests showed a low white blood cell  count, specifically neutrophils, which was at 600 at one point. The patient was informed that this could be due to her arthritis medication and could increase her risk of bacterial infections.   The patient denies any other swelling outside of the hands and wrists. She has been using an ointment for the discomfort in her hands.         Previous HPI 06/28/2023 Karina Murphy is a 43 y.o. 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  Still has some persistent joint pain and stiffness most  commonly MCP and PIP joints.  Worse on her left thumb recently sometimes with associated swelling or sometimes without change in appearance.  She had recent episode where her usual health was not available and had to self administer her Cimzia  had some small raised bump and discoloration around the injection site for a few days.  Recent labs from October 4 with leukopenia to 3.2 with 1000 neutrophils otherwise normal.   Previous HPI 03/28/2023 Karina Murphy is a 43 y.o. 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 Since last visit her joint pain is doing well without prolonged morning stiffness. Left shoulder pain is partially better, but has some increased wrist pain near the base of the thumbs. Frequently worsened when lifting her child. No interval infections or antibiotics. Skin rashes are ongoing on her arms, tried topical steroid that was prescribed did not help much. Started using an OTC eczema cream that is helping partially.   Previous HPI 12/27/22 Karina Murphy is a 43 y.o. female here for follow up for seropositive RA after starting Cimzia  currently finishing loading dose before transition to 400 mg subcu every 4 weeks.  Since her last visit she also discontinued the sulfasalazine  due to not seeing clinical benefit from this.  So far symptoms are largely improved since starting the Cimzia  with decreased joint pain and swelling.  Still has some pain and has morning stiffness every day but with decreased severity.  Has not noticed any major flareup or exacerbation from stopping the sulfasalazine .  She had an episode of viral gastroenteritis in March that resolved without major complication but has had some ongoing lower abdominal pain afterwards.  Still having skin rashes most often around her hand and wrist or foot.  Also with some eye itching and redness consistent with seasonal allergy symptoms.   Previous HPI 09/27/22 Karina Murphy  is a 43 y.o. 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 She delivered her baby last month uneventfully. For about 3 weeks notices new skin rashes on her right forearm and on both legs above the knees. This is itchy and bothers her most at night time.    Previous HPI 07/30/22 Karina Murphy is a 43 y.o. female here for seropositive RA. She was previously seeing rheumatology after onset of symptoms around 2014 with hand and foot inflammation initially.  She is also experienced involvement of bilateral shoulder pain and stiffness that started later.  She was on treatment but stopped following up after her rheumatologist left that practice in 2018.  Previous treatments including methotrexate and sulfasalazine  and prednisone .  She has pretty much been off any maintenance medication since that time treatments have often consisted of intramuscular steroid injections as needed for disease flareups.  She is getting evaluation now largely due to increased healthcare system exposure with her pregnancy.  Her arthritis  symptoms have overall been doing better during this pregnancy compared to before.  She is not taking any medications regularly for pain.  Worst affected area at the moment is pain and swelling in her right foot.   This encounter was conducted with assistance of in person Spanish language interpreter.   No Rheumatology ROS completed.   PMFS History:  Patient Active Problem List   Diagnosis Date Noted   Sinusitis, acute 02/09/2024   Hypokalemia 02/09/2024   Acid reflux disease 02/09/2024   Radial styloid tenosynovitis (de quervain) 03/28/2023   Allergic conjunctivitis 12/27/2022   High risk medication use 07/30/2022   Language barrier 03/31/2022   AMA (advanced maternal age) multigravida 35+ 02/04/2022   Rash and other nonspecific skin eruption 08/31/2016   Seropositive rheumatoid arthritis (HCC) 02/12/2014   Obese 12/26/2013    Past  Medical History:  Diagnosis Date   Anxiety    Arthritis, rheumatoid (HCC)    Breast pain, left 09/17/2016   Chronic headaches    GERD (gastroesophageal reflux disease)    Obesity    Osteitis of pelvic region 04/16/2019   Urticaria    Weakness 11/23/2016    Family History  Problem Relation Age of Onset   Diabetes Mother    Hypertension Mother    Diabetes Brother    Stomach cancer Neg Hx    Colon cancer Neg Hx    Esophageal cancer Neg Hx    Pancreatic cancer Neg Hx    Asthma Neg Hx    Cancer Neg Hx    Heart disease Neg Hx    Stroke Neg Hx    Colon polyps Neg Hx    Rectal cancer Neg Hx    Past Surgical History:  Procedure Laterality Date   CHOLECYSTECTOMY  07/2020   Social History   Social History Narrative   Completed 2nd grade.    Immunization History  Administered Date(s) Administered   Influenza,inj,Quad PF,6+ Mos 06/28/2014, 09/17/2016, 06/14/2022   Tdap 06/14/2022     Objective: Vital Signs: There were no vitals taken for this visit.   Physical Exam   Musculoskeletal Exam: ***  CDAI Exam: CDAI Score: -- Patient Global: --; Provider Global: -- Swollen: --; Tender: -- Joint Exam 09/04/2024   No joint exam has been documented for this visit   There is currently no information documented on the homunculus. Go to the Rheumatology activity and complete the homunculus joint exam.  Investigation: No additional findings.  Imaging: No results found.  Recent Labs: Lab Results  Component Value Date   WBC 3.8 03/19/2024   HGB 11.7 03/19/2024   PLT 293 03/19/2024   NA 139 03/29/2024   K 4.2 03/29/2024   CL 104 03/29/2024   CO2 25 03/29/2024   GLUCOSE 99 03/29/2024   BUN 13 03/29/2024   CREATININE 0.91 03/29/2024   BILITOT 0.9 03/29/2024   ALKPHOS 62 03/29/2024   AST 14 (L) 03/29/2024   ALT 11 03/29/2024   PROT 8.1 03/29/2024   ALBUMIN 3.6 03/29/2024   CALCIUM  9.4 03/29/2024   GFRAA 129 03/19/2020   QFTBGOLDPLUS Negative 09/29/2023     Speciality Comments: Cimzia  started 11/01/2022  Procedures:  No procedures performed Allergies: Bee pollen, Tylenol  [acetaminophen ], Morphine , and Pineapple   Assessment / Plan:     Visit Diagnoses: No diagnosis found.  ***  Orders: No orders of the defined types were placed in this encounter.  No orders of the defined types were placed in this encounter.    Follow-Up Instructions: No  follow-ups on file.   Bindu Docter M Danity Schmelzer, CMA  Note - This record has been created using [employer].  Chart creation errors have been sought, but may not always  have been located. Such creation errors do not reflect on  the standard of medical care.

## 2024-09-04 ENCOUNTER — Ambulatory Visit: Payer: Self-pay | Admitting: Internal Medicine

## 2024-09-04 DIAGNOSIS — Z79899 Other long term (current) drug therapy: Secondary | ICD-10-CM

## 2024-09-04 DIAGNOSIS — M059 Rheumatoid arthritis with rheumatoid factor, unspecified: Secondary | ICD-10-CM

## 2024-09-10 NOTE — Progress Notes (Signed)
 "  Office Visit Note  Patient: Karina Murphy             Date of Birth: 10-31-1980           MRN: [id number]             Visit Date: 09/20/2024 Interpreter: Ozie JONELLE GLENWOOD CHERRI  PCP: Theotis Haze ORN, NP   Subjective:   Chief Complaint: Medication Management (Would like to discuss medication for her RA has not had anything since November. ), Pain, and Joint Pain (Shoulders, hands, knees down, )   History of Present Illness: Karina Murphy is a 43 y.o. female with seropositive rheumatoid arthritis who is here for follow up today. Due to language barrier, an interpreter was present during the history-taking and subsequent discussion (and for part of the physical exam) with this patient. Today, she endorses excruciating joint pain, swelling, and restricted range of motion in her hands, wrists, elbows, shoulders, knees, ankles, and toes. She notes that her last dose of Cimzia  was in November. She felt good in regards to her joint pain in November and most of December, but notes that for the past 2 weeks her pain has been extremely bad. She forgot she had a prescription of prednisone  to take daily, so she started taking prednisone  10 mg on Sunday, which has helped mildly. Her pain is restricting her from her activities of daily living and interacting with her 16 year old child.   Of note, at her last appointment, we had planned to switch her from Cimzia  due to neutropenia to Rinvoq. Patient later messaged and asked if she could stay on Cimzia  for a little bit longer and switch to Rinvoq after a few more months. During that time, she had a lapse in her medical coverage and was unable to receive any more Cimzia  injections. She also states she received information from her medical coverage that Rinvoq is not covered.   Previous Medication Trials: Cimzia  (neutropenia), methotrexate (ineffective), sulfasalazine  (ineffecctive)  Last Labs: 03/27/2024 - Sed rate 26, CMP WNL, lipid panel WNL  Review  of Systems: Review of Systems  Constitutional:  Positive for fatigue.  HENT:  Positive for mouth dryness. Negative for mouth sores.   Eyes:  Positive for dryness.  Respiratory:  Negative for shortness of breath.   Cardiovascular:  Negative for chest pain and palpitations.  Gastrointestinal:  Positive for constipation. Negative for blood in stool and diarrhea.  Endocrine: Negative for increased urination.  Genitourinary:  Negative for involuntary urination.  Musculoskeletal:  Positive for joint pain, joint pain, joint swelling, myalgias, morning stiffness, muscle tenderness and myalgias. Negative for gait problem and muscle weakness.  Skin:  Positive for hair loss and sensitivity to sunlight. Negative for color change and rash.  Allergic/Immunologic: Negative for susceptible to infections.  Neurological:  Positive for headaches. Negative for dizziness.  Hematological:  Negative for swollen glands.  Psychiatric/Behavioral:  Positive for sleep disturbance. Negative for depressed mood. The patient is nervous/anxious.     Objective: Vital Signs: BP 121/64   Pulse 70   Temp 97.7 F (36.5 C)   Resp 16   Ht 5' 3 (1.6 m)   Wt 195 lb 12.8 oz (88.8 kg)   BMI 34.68 kg/m   Physical Exam Constitutional:      General: She is not in acute distress.    Appearance: Normal appearance.  HENT:     Head: Normocephalic and atraumatic.  Eyes:     General: Lids are normal. No scleral  icterus.    Conjunctiva/sclera: Conjunctivae normal.     Pupils: Pupils are equal, round, and reactive to light.  Pulmonary:     Effort: Pulmonary effort is normal.  Musculoskeletal:     Comments: Significant synovitis present in MCPs bilaterally. Tenderness noted across all MCPs, PIPs, and MTPs. Tenderness noted in both shoulders, elbows, wrists, knees, and ankles, L>R. Shoulder joints, elbow joints, wrist joints, MCPs, PIPs, DIPs have restricted range of motion. Significant pain with rotation of the left shoulder.  Fingers with swan neck deformities and lateral deviation Complete fist formation bilaterally. Knee joints have pain with range of motion but no warmth or effusion. Ankle joints have limited range of motion with no tenderness or joint swelling. No evidence of Achilles tendinitis or plantar fasciitis. Positive MTP squeeze.   Skin:    General: Skin is warm.     Findings: No rash.  Neurological:     Mental Status: She is alert.  Psychiatric:        Mood and Affect: Mood normal.        Behavior: Behavior normal. Behavior is cooperative.      Problem List:  Patient Active Problem List   Diagnosis Date Noted   Acid reflux disease 02/09/2024   Radial styloid tenosynovitis (de quervain) 03/28/2023   High risk medication use 07/30/2022   Seropositive rheumatoid arthritis (HCC) 02/12/2014   Obese 12/26/2013    Current Outpatient Medications:    pantoprazole  (PROTONIX ) 40 MG tablet, Take 1 tablet (40 mg total) by mouth 2 (two) times daily before a meal. For heartburn or stomach pain, Disp: 180 tablet, Rfl: 1   predniSONE  (DELTASONE ) 10 MG tablet, Take 2 tablets (20 mg total) by mouth daily with breakfast., Disp: 60 tablet, Rfl: 0    PMFS History:  History: Past Medical History:  Diagnosis Date   Anxiety    Arthritis, rheumatoid (HCC)    Breast pain, left 09/17/2016   Chronic headaches    GERD (gastroesophageal reflux disease)    Obesity    Osteitis of pelvic region 04/16/2019   Urticaria    Weakness 11/23/2016    Family History  Problem Relation Age of Onset   Diabetes Mother    Hypertension Mother    Diabetes Brother    Stomach cancer Neg Hx    Colon cancer Neg Hx    Esophageal cancer Neg Hx    Pancreatic cancer Neg Hx    Asthma Neg Hx    Cancer Neg Hx    Heart disease Neg Hx    Stroke Neg Hx    Colon polyps Neg Hx    Rectal cancer Neg Hx    Past Surgical History:  Procedure Laterality Date   CHOLECYSTECTOMY  07/2020   Social History   Social History Narrative    Completed 2nd grade.    Allergies: Bee pollen, Tylenol  [acetaminophen ], Morphine , and Pineapple  Immunization status:  Immunization History  Administered Date(s) Administered   Influenza,inj,Quad PF,6+ Mos 06/28/2014, 09/17/2016, 06/14/2022   Tdap 06/14/2022     Assessment:  Visit Diagnoses:  Seropositive rheumatoid arthritis (HCC) - Currently active disease activity. Synovitis, tenderness, and restricted range of motion present on exam today. Patient's last Cimzia  injection was November 2025. She was having neutropenia and increased infections despite lowering the frequency of injections, so the plan was to switch her to Rinvoq, which she is an excellent candidate for. She has previously tried methotrexate and sulfasalazine  with a previous rheumatologist. She received information from her health coverage that  Rinvoq was not covered. I am wondering if they prefer [place] and will have our pharmacy team look into this. Patient is agreeable to this plan. Will check ESR, CBC w/ diff, CMP w/ GFR, lipid panel, and update her Tb testing. -Start her Rinvoq 15 mg PO daily.  -Start prednisone  20 mg daily for 1 month or until we can get her started on Rinvoq. At that time, we will decrease back down to 10 mg daily and slowly taper her off. Advised her not to take any NSAIDs while taking prednisone .  . Labs written for patient to take to hospital to be obtained due to patient assistance plan.  - Risks, benefits, and alternatives to treatment with Rinvoq previously discussed with patient. She denies personal history of diverticulitis, CV events, and blood clots. Hepatitis B, hepatitis C, and tuberculosis testing negative. Patient was counseled to hold the medication when febrile or with signs/symptoms of infection. She is advised to get Shingrix vaccine prior to starting.   High risk medication use This patient is on drug therapy requiring intensive monitoring for toxicity, including regular lab monitoring  and screening for serious or recurrent infections. Today, I assessed for side effects, infections, new or worsening health conditions that may be related to the medications, and will obtain labs.  Follow-Up Instructions:  Return in about 8 weeks (around 11/15/2024).   Orders: No orders of the defined types were placed in this encounter.   Meds ordered this encounter  Medications   predniSONE  (DELTASONE ) 10 MG tablet    Sig: Take 2 tablets (20 mg total) by mouth daily with breakfast.    Dispense:  60 tablet    Refill:  0     Kya Mayfield S Bunn, PA-C  Note - This record has been created using [employer]. Chart creation errors have been sought, but may not always have been located. Such creation errors do not reflect on the standard of medical care.  "

## 2024-09-20 ENCOUNTER — Other Ambulatory Visit: Payer: Self-pay

## 2024-09-20 ENCOUNTER — Ambulatory Visit: Payer: Self-pay

## 2024-09-20 VITALS — BP 121/64 | HR 70 | Temp 97.7°F | Resp 16 | Ht 63.0 in | Wt 195.8 lb

## 2024-09-20 DIAGNOSIS — M059 Rheumatoid arthritis with rheumatoid factor, unspecified: Secondary | ICD-10-CM

## 2024-09-20 DIAGNOSIS — Z79899 Other long term (current) drug therapy: Secondary | ICD-10-CM

## 2024-09-20 MED ORDER — PREDNISONE 10 MG PO TABS
20.0000 mg | ORAL_TABLET | Freq: Every day | ORAL | 0 refills | Status: AC
  Filled 2024-09-20: qty 60, 30d supply, fill #0

## 2024-09-21 ENCOUNTER — Other Ambulatory Visit (HOSPITAL_COMMUNITY)
Admission: RE | Admit: 2024-09-21 | Discharge: 2024-09-21 | Disposition: A | Payer: Self-pay | Source: Ambulatory Visit | Attending: Internal Medicine | Admitting: Internal Medicine

## 2024-09-21 DIAGNOSIS — M059 Rheumatoid arthritis with rheumatoid factor, unspecified: Secondary | ICD-10-CM | POA: Insufficient documentation

## 2024-09-21 DIAGNOSIS — Z79899 Other long term (current) drug therapy: Secondary | ICD-10-CM | POA: Insufficient documentation

## 2024-09-21 DIAGNOSIS — Z111 Encounter for screening for respiratory tuberculosis: Secondary | ICD-10-CM | POA: Insufficient documentation

## 2024-09-21 LAB — COMPREHENSIVE METABOLIC PANEL WITH GFR
ALT: 7 U/L (ref 0–44)
AST: 12 U/L — ABNORMAL LOW (ref 15–41)
Albumin: 4.2 g/dL (ref 3.5–5.0)
Alkaline Phosphatase: 81 U/L (ref 38–126)
Anion gap: 12 (ref 5–15)
BUN: 9 mg/dL (ref 6–20)
CO2: 23 mmol/L (ref 22–32)
Calcium: 9.5 mg/dL (ref 8.9–10.3)
Chloride: 101 mmol/L (ref 98–111)
Creatinine, Ser: 0.81 mg/dL (ref 0.44–1.00)
GFR, Estimated: >60 mL/min
Glucose, Bld: 113 mg/dL — ABNORMAL HIGH (ref 70–99)
Potassium: 3.9 mmol/L (ref 3.5–5.1)
Sodium: 136 mmol/L (ref 135–145)
Total Bilirubin: 0.8 mg/dL (ref 0.0–1.2)
Total Protein: 8.4 g/dL — ABNORMAL HIGH (ref 6.5–8.1)

## 2024-09-21 LAB — CBC WITH DIFFERENTIAL/PLATELET
Abs Immature Granulocytes: 0.02 K/uL (ref 0.00–0.07)
Basophils Absolute: 0 K/uL (ref 0.0–0.1)
Basophils Relative: 1 %
Eosinophils Absolute: 0 K/uL (ref 0.0–0.5)
Eosinophils Relative: 1 %
HCT: 37 % (ref 36.0–46.0)
Hemoglobin: 11.8 g/dL — ABNORMAL LOW (ref 12.0–15.0)
Immature Granulocytes: 0 %
Lymphocytes Relative: 34 %
Lymphs Abs: 2.4 K/uL (ref 0.7–4.0)
MCH: 24.8 pg — ABNORMAL LOW (ref 26.0–34.0)
MCHC: 31.9 g/dL (ref 30.0–36.0)
MCV: 77.7 fL — ABNORMAL LOW (ref 80.0–100.0)
Monocytes Absolute: 0.6 K/uL (ref 0.1–1.0)
Monocytes Relative: 8 %
Neutro Abs: 4 K/uL (ref 1.7–7.7)
Neutrophils Relative %: 56 %
Platelets: 374 K/uL (ref 150–400)
RBC: 4.76 MIL/uL (ref 3.87–5.11)
RDW: 15.7 % — ABNORMAL HIGH (ref 11.5–15.5)
WBC: 7.1 K/uL (ref 4.0–10.5)
nRBC: 0 % (ref 0.0–0.2)

## 2024-09-21 LAB — SEDIMENTATION RATE: Sed Rate: 32 mm/h — ABNORMAL HIGH (ref 0–22)

## 2024-09-24 ENCOUNTER — Ambulatory Visit: Payer: Self-pay | Admitting: Internal Medicine

## 2024-09-24 LAB — QUANTIFERON-TB GOLD PLUS (RQFGPL)
QuantiFERON Mitogen Value: 2.96 [IU]/mL
QuantiFERON Nil Value: 0.03 [IU]/mL
QuantiFERON TB1 Ag Value: 0.04 [IU]/mL
QuantiFERON TB2 Ag Value: 0.05 [IU]/mL

## 2024-09-24 LAB — QUANTIFERON-TB GOLD PLUS: QuantiFERON-TB Gold Plus: NEGATIVE

## 2024-09-24 NOTE — Progress Notes (Signed)
 Sedimentation rate was elevated at 32 consistent with active RA flare up. Blood count indicates very mild anemia, probably iron deficiency. Kidney and liver function tests are normal and TB screening is negative. No changes to planned Rinvoq start plan.

## 2024-09-27 ENCOUNTER — Telehealth: Payer: Self-pay

## 2024-09-27 NOTE — Telephone Encounter (Addendum)
 Patient is uninsured - would need to apply for Rinvoq PAP  ----- Message from Daved GORMAN Holstein sent at 09/20/2024 12:45 PM EST ----- Regarding: Rinvoq Start Hi, can you help me get this patient started on Rinvoq? She states she received something from her medical coverage that it is not covered. Ok if we need to do [place] instead if that is preferred with her coverage.

## 2024-10-04 ENCOUNTER — Telehealth: Payer: Self-pay

## 2024-10-04 ENCOUNTER — Ambulatory Visit: Payer: Self-pay | Admitting: Nurse Practitioner

## 2024-10-04 NOTE — Telephone Encounter (Signed)
 Patient returned call,Appointment scheduled

## 2024-10-04 NOTE — Telephone Encounter (Signed)
 Called patient to schedule sooner appointment with PCP for tomorrow at 4:10 PM

## 2024-10-05 ENCOUNTER — Other Ambulatory Visit: Payer: Self-pay

## 2024-10-05 ENCOUNTER — Ambulatory Visit: Payer: Self-pay | Attending: Nurse Practitioner | Admitting: Nurse Practitioner

## 2024-10-05 ENCOUNTER — Encounter: Payer: Self-pay | Admitting: Nurse Practitioner

## 2024-10-05 VITALS — BP 123/84 | HR 86 | Ht 63.0 in | Wt 203.0 lb

## 2024-10-05 DIAGNOSIS — D500 Iron deficiency anemia secondary to blood loss (chronic): Secondary | ICD-10-CM

## 2024-10-05 DIAGNOSIS — J0110 Acute frontal sinusitis, unspecified: Secondary | ICD-10-CM

## 2024-10-05 MED ORDER — PROMETHAZINE-DM 6.25-15 MG/5ML PO SYRP
5.0000 mL | ORAL_SOLUTION | Freq: Four times a day (QID) | ORAL | 0 refills | Status: AC | PRN
  Filled 2024-10-05: qty 240, 12d supply, fill #0

## 2024-10-05 MED ORDER — AMOXICILLIN-POT CLAVULANATE 875-125 MG PO TABS
1.0000 | ORAL_TABLET | Freq: Two times a day (BID) | ORAL | 0 refills | Status: AC
  Filled 2024-10-05: qty 14, 7d supply, fill #0

## 2024-10-05 MED ORDER — IPRATROPIUM BROMIDE 0.03 % NA SOLN
2.0000 | Freq: Two times a day (BID) | NASAL | 0 refills | Status: AC
  Filled 2024-10-05: qty 30, 75d supply, fill #0

## 2024-10-05 MED ORDER — ALBUTEROL SULFATE HFA 108 (90 BASE) MCG/ACT IN AERS
2.0000 | INHALATION_SPRAY | Freq: Four times a day (QID) | RESPIRATORY_TRACT | 0 refills | Status: AC | PRN
  Filled 2024-10-05: qty 6.7, 16d supply, fill #0

## 2024-10-05 NOTE — Progress Notes (Signed)
 "  Assessment & Plan:  Karina Murphy was seen today for anemia.  Diagnoses and all orders for this visit:  Iron deficiency anemia due to chronic blood loss -     Iron, TIBC and Ferritin Panel; Future May need to restart OTC MVI iron   Acute non-recurrent frontal sinusitis -     amoxicillin -clavulanate (AUGMENTIN ) 875-125 MG tablet; Take 1 tablet by mouth 2 (two) times daily for 7 days. -     albuterol  (VENTOLIN  HFA) 108 (90 Base) MCG/ACT inhaler; Inhale 2 puffs into the lungs every 6 (six) hours as needed for wheezing or shortness of breath. -     ipratropium (ATROVENT) 0.03 % nasal spray; Place 2 sprays into both nostrils every 12 (twelve) hours. -     promethazine -dextromethorphan (PROMETHAZINE -DM) 6.25-15 MG/5ML syrup; Take 5 mLs by mouth 4 (four) times daily as needed.    Patient has been counseled on age-appropriate routine health concerns for screening and prevention. These are reviewed and up-to-date. Referrals have been placed accordingly. Immunizations are up-to-date or declined.    Subjective:   Chief Complaint  Patient presents with   Anemia    Karina Murphy 44 y.o. female presents to office today for anemia and URI symptoms  VRI was used to communicate directly with patient for the entire encounter including providing detailed patient instructions.    She was instructed by rheumatology regarding her most recent lab work including a mild anemia.  She is given an appointment today to discuss.  I have instructed her that she has chronic anemia and has been mildly anemic for several years and even took iron in the past for this.  We are unable to check her iron levels today as she has on her menstrual cycle.  Sinus Pain: Patient complains of congestion, coryza, facial pain, nasal congestion, productive cough with  mucoid and white colored sputum, and sinus pressure. Symptoms include post nasal drip with no fever, chills, night sweats or weight loss. Onset of symptoms was a  few weeks ago, unchanged since that time. She is drinking plenty of fluids.     Review of Systems  Constitutional:  Positive for malaise/fatigue. Negative for fever and weight loss.  HENT:  Positive for congestion. Negative for nosebleeds.   Eyes: Negative.  Negative for blurred vision, double vision and photophobia.  Respiratory:  Positive for cough and sputum production. Negative for shortness of breath.   Cardiovascular: Negative.  Negative for chest pain, palpitations and leg swelling.  Gastrointestinal: Negative.  Negative for heartburn, nausea and vomiting.  Musculoskeletal: Negative.  Negative for myalgias.  Neurological:  Positive for headaches. Negative for dizziness, focal weakness and seizures.  Psychiatric/Behavioral: Negative.  Negative for suicidal ideas.     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   Outpatient Medications Prior to Visit  Medication Sig Dispense Refill   pantoprazole  (PROTONIX ) 40 MG tablet Take 1 tablet (40 mg total) by mouth 2 (two) times daily before a meal. For heartburn or stomach pain 180 tablet 1   predniSONE  (DELTASONE ) 10 MG tablet Take 2 tablets (20 mg total) by mouth daily with breakfast. 60 tablet 0   No facility-administered  medications prior to visit.    Allergies[1]     Objective:    BP 123/84 (BP Location: Left Arm, Patient Position: Sitting, Cuff Size: Normal)   Pulse 86   Ht 5' 3 (1.6 m)   Wt 203 lb (92.1 kg)   LMP 10/04/2024 (Exact Date)   SpO2 100%   BMI 35.96 kg/m  Wt Readings from Last 3 Encounters:  10/05/24 203 lb (92.1 kg)  09/20/24 195 lb 12.8 oz (88.8 kg)  06/19/24 197 lb 3.2 oz (89.4 kg)    Physical Exam Vitals and nursing note reviewed.  Constitutional:      Appearance: She is well-developed.  HENT:     Head: Normocephalic and atraumatic.     Right Ear: A middle ear effusion is present.     Left Ear: A middle ear effusion is present.     Nose: Mucosal edema and congestion present.     Right Turbinates: Enlarged, swollen and pale.     Left Turbinates: Enlarged, swollen and pale.  Cardiovascular:     Rate and Rhythm: Normal rate and regular rhythm.     Heart sounds: Normal heart sounds. No murmur heard.    No friction rub. No gallop.  Pulmonary:     Effort: Pulmonary effort is normal. No tachypnea or respiratory distress.     Breath sounds: Normal breath sounds. No decreased breath sounds, wheezing, rhonchi or rales.  Chest:     Chest wall: No tenderness.  Musculoskeletal:        General: Normal range of motion.     Cervical back: Normal range of motion.  Skin:    General: Skin is warm and dry.  Neurological:     Mental Status: She is alert and oriented to person, place, and time.     Coordination: Coordination normal.  Psychiatric:        Behavior: Behavior normal. Behavior is cooperative.        Thought Content: Thought content normal.        Judgment: Judgment normal.          Patient has been counseled extensively about nutrition and exercise as well as the importance of adherence with medications and regular follow-up. The patient was given clear instructions to go to ER or return to medical center if symptoms don't improve, worsen or new problems develop.  The patient verbalized understanding.   Follow-up: Return if symptoms worsen or fail to improve.   Haze LELON Servant, FNP-BC [facility] [place], [place] [phone]   10/05/2024, 4:38 PM     [1]  Allergies Allergen Reactions   Bee Pollen Swelling    Itchy throat, itchy eyes   Tylenol  [Acetaminophen ] Other (See Comments)    Tylenol  EXTRA STRENGTH only per pt- TACHYCARDIA per pt   Morphine  Palpitations   Pineapple Itching   "

## 2024-10-23 ENCOUNTER — Ambulatory Visit: Payer: Self-pay | Admitting: Nurse Practitioner

## 2024-11-21 ENCOUNTER — Ambulatory Visit: Payer: Self-pay

## 2024-12-18 ENCOUNTER — Ambulatory Visit: Payer: Self-pay | Admitting: Nurse Practitioner
